# Patient Record
Sex: Female | Born: 1935
Health system: Southern US, Community
[De-identification: ages and names within clinical notes are randomized; demographics above are authoritative.]

## PROBLEM LIST (undated history)

## (undated) DIAGNOSIS — I10 Essential (primary) hypertension: Secondary | ICD-10-CM

## (undated) DIAGNOSIS — D649 Anemia, unspecified: Secondary | ICD-10-CM

## (undated) DIAGNOSIS — O9989 Other specified diseases and conditions complicating pregnancy, childbirth and the puerperium: Secondary | ICD-10-CM

## (undated) DIAGNOSIS — K219 Gastro-esophageal reflux disease without esophagitis: Secondary | ICD-10-CM

## (undated) DIAGNOSIS — Z8619 Personal history of other infectious and parasitic diseases: Secondary | ICD-10-CM

## (undated) DIAGNOSIS — G459 Transient cerebral ischemic attack, unspecified: Secondary | ICD-10-CM

## (undated) HISTORY — DX: Transient cerebral ischemic attack, unspecified: G45.9

## (undated) HISTORY — DX: Essential (primary) hypertension: I10

## (undated) HISTORY — PX: BREAST BIOPSY: SHX20

## (undated) HISTORY — DX: Personal history of other infectious and parasitic diseases: Z86.19

## (undated) HISTORY — DX: Anemia, unspecified: D64.9

## (undated) HISTORY — DX: Other specified diseases and conditions complicating pregnancy, childbirth and the puerperium: O99.89

## (undated) HISTORY — PX: CHOLECYSTECTOMY: SHX55

## (undated) HISTORY — DX: Gastro-esophageal reflux disease without esophagitis: K21.9

## (undated) HISTORY — PX: PARTIAL HYSTERECTOMY: SHX80

---

## 1959-11-07 DIAGNOSIS — O99891 Other specified diseases and conditions complicating pregnancy: Secondary | ICD-10-CM

## 1959-11-07 HISTORY — DX: Other specified diseases and conditions complicating pregnancy: O99.891

## 2001-10-15 ENCOUNTER — Other Ambulatory Visit: Admission: RE | Admit: 2001-10-15 | Discharge: 2001-10-15 | Payer: Self-pay | Admitting: Radiology

## 2001-10-23 ENCOUNTER — Ambulatory Visit (HOSPITAL_BASED_OUTPATIENT_CLINIC_OR_DEPARTMENT_OTHER): Admission: RE | Admit: 2001-10-23 | Discharge: 2001-10-23 | Payer: Self-pay | Admitting: General Surgery

## 2001-10-23 ENCOUNTER — Ambulatory Visit (HOSPITAL_COMMUNITY): Admission: RE | Admit: 2001-10-23 | Discharge: 2001-10-23 | Payer: Self-pay | Admitting: General Surgery

## 2001-10-23 ENCOUNTER — Encounter: Payer: Self-pay | Admitting: General Surgery

## 2001-10-23 ENCOUNTER — Encounter: Admission: RE | Admit: 2001-10-23 | Discharge: 2001-10-23 | Payer: Self-pay | Admitting: General Surgery

## 2001-11-14 ENCOUNTER — Ambulatory Visit: Admission: RE | Admit: 2001-11-14 | Discharge: 2002-02-12 | Payer: Self-pay | Admitting: *Deleted

## 2002-10-27 ENCOUNTER — Ambulatory Visit (HOSPITAL_COMMUNITY): Admission: RE | Admit: 2002-10-27 | Discharge: 2002-10-27 | Payer: Self-pay | Admitting: Internal Medicine

## 2002-10-27 ENCOUNTER — Encounter: Payer: Self-pay | Admitting: Internal Medicine

## 2006-02-12 ENCOUNTER — Ambulatory Visit: Payer: Self-pay | Admitting: Cardiology

## 2006-02-12 ENCOUNTER — Ambulatory Visit: Payer: Self-pay | Admitting: Internal Medicine

## 2006-02-16 ENCOUNTER — Encounter: Admission: RE | Admit: 2006-02-16 | Discharge: 2006-02-16 | Payer: Self-pay | Admitting: Internal Medicine

## 2006-04-30 ENCOUNTER — Encounter: Admission: RE | Admit: 2006-04-30 | Discharge: 2006-04-30 | Payer: Self-pay | Admitting: Internal Medicine

## 2007-02-11 ENCOUNTER — Ambulatory Visit: Payer: Self-pay | Admitting: Internal Medicine

## 2007-02-26 ENCOUNTER — Ambulatory Visit: Payer: Self-pay

## 2007-06-21 DIAGNOSIS — Z853 Personal history of malignant neoplasm of breast: Secondary | ICD-10-CM | POA: Insufficient documentation

## 2008-02-26 ENCOUNTER — Telehealth: Payer: Self-pay | Admitting: Internal Medicine

## 2008-03-02 ENCOUNTER — Encounter: Payer: Self-pay | Admitting: Internal Medicine

## 2011-02-23 ENCOUNTER — Other Ambulatory Visit: Payer: Self-pay | Admitting: Obstetrics and Gynecology

## 2011-02-23 DIAGNOSIS — H547 Unspecified visual loss: Secondary | ICD-10-CM

## 2011-02-23 DIAGNOSIS — R51 Headache: Secondary | ICD-10-CM

## 2011-02-28 ENCOUNTER — Ambulatory Visit
Admission: RE | Admit: 2011-02-28 | Discharge: 2011-02-28 | Disposition: A | Discharge status: Home / Self Care | Payer: Medicare Other | Source: Ambulatory Visit | Attending: Obstetrics and Gynecology | Admitting: Obstetrics and Gynecology

## 2011-02-28 DIAGNOSIS — H547 Unspecified visual loss: Secondary | ICD-10-CM

## 2011-02-28 DIAGNOSIS — R51 Headache: Secondary | ICD-10-CM

## 2011-04-25 ENCOUNTER — Encounter: Payer: Self-pay | Admitting: Family Medicine

## 2011-04-25 ENCOUNTER — Ambulatory Visit (INDEPENDENT_AMBULATORY_CARE_PROVIDER_SITE_OTHER): Payer: Medicare Other | Admitting: Family Medicine

## 2011-04-25 VITALS — BP 134/68 | Temp 98.8°F | Ht 63.0 in | Wt 207.8 lb

## 2011-04-25 DIAGNOSIS — I10 Essential (primary) hypertension: Secondary | ICD-10-CM | POA: Insufficient documentation

## 2011-04-25 DIAGNOSIS — G43809 Other migraine, not intractable, without status migrainosus: Secondary | ICD-10-CM

## 2011-04-25 DIAGNOSIS — K219 Gastro-esophageal reflux disease without esophagitis: Secondary | ICD-10-CM | POA: Insufficient documentation

## 2011-04-25 DIAGNOSIS — G43109 Migraine with aura, not intractable, without status migrainosus: Secondary | ICD-10-CM

## 2011-04-25 DIAGNOSIS — Z853 Personal history of malignant neoplasm of breast: Secondary | ICD-10-CM

## 2011-04-25 DIAGNOSIS — Z0000 Encounter for general adult medical examination without abnormal findings: Secondary | ICD-10-CM | POA: Insufficient documentation

## 2011-04-25 DIAGNOSIS — Z13220 Encounter for screening for lipoid disorders: Secondary | ICD-10-CM

## 2011-04-25 LAB — CBC WITH DIFFERENTIAL/PLATELET
Eosinophils Relative: 2.8 % (ref 0.0–5.0)
HCT: 37.4 % (ref 36.0–46.0)
Hemoglobin: 12.5 g/dL (ref 12.0–15.0)
Lymphocytes Relative: 27.3 % (ref 12.0–46.0)
Lymphs Abs: 1.5 10*3/uL (ref 0.7–4.0)
MCHC: 33.5 g/dL (ref 30.0–36.0)
MCV: 88.8 fl (ref 78.0–100.0)
Monocytes Absolute: 0.4 10*3/uL (ref 0.1–1.0)
Monocytes Relative: 6.4 % (ref 3.0–12.0)
Platelets: 210 10*3/uL (ref 150.0–400.0)
RDW: 14.6 % (ref 11.5–14.6)
WBC: 5.6 10*3/uL (ref 4.5–10.5)

## 2011-04-25 LAB — BASIC METABOLIC PANEL
BUN: 13 mg/dL (ref 6–23)
Calcium: 9.2 mg/dL (ref 8.4–10.5)
Chloride: 104 mEq/L (ref 96–112)
Creatinine, Ser: 0.6 mg/dL (ref 0.4–1.2)
GFR: 114.67 mL/min (ref >60.00)

## 2011-04-25 LAB — LIPID PANEL
HDL: 53.5 mg/dL (ref >39.00)
LDL Cholesterol: 117 mg/dL — ABNORMAL HIGH (ref 0–99)
Total CHOL/HDL Ratio: 4
Triglycerides: 143 mg/dL (ref 0.0–149.0)
VLDL: 28.6 mg/dL (ref 0.0–40.0)

## 2011-04-25 LAB — HEPATIC FUNCTION PANEL
ALT: 20 U/L (ref 0–35)
Alkaline Phosphatase: 97 U/L (ref 39–117)
Bilirubin, Direct: 0.1 mg/dL (ref 0.0–0.3)

## 2011-04-25 MED ORDER — OMEPRAZOLE 20 MG PO CPDR: 20.0000 mg | DELAYED_RELEASE_CAPSULE | Freq: Every day | ORAL | Status: DC

## 2011-04-25 MED ORDER — AMLODIPINE BESYLATE 10 MG PO TABS: 10.0000 mg | ORAL_TABLET | Freq: Every day | ORAL | Status: DC

## 2011-04-25 NOTE — Patient Instructions (Signed)
Schedule a follow up in 6 months to recheck your blood pressure We'll notify you of your lab results Please call with any questions or concerns Welcome!  We're glad to have you!

## 2011-04-25 NOTE — Progress Notes (Signed)
  Subjective:    Patient ID: Ann Conley, female    DOB: February 24, 1934, 77 y.o.   MRN: [id number]  HPI New to establish care.  Previous MD- Elana Alm (retired)  Here today for CPE.  Risk Factors: HTN- chronic problem for pt, is taking Amlodipine 'intermittently', 'i'm trying to get off it'.  No CP, SOB, HAs, visual changes, edema. Breast Cancer- has been cancer free for 8 yrs, s/p lumpectomy and radiation.  Due for mammogram this month- [facility]. Occular Migraine- had MRI 2 months ago, had normal scan.  sxs are intermittent. GERD- chronic problem for pt, sxs occur intermittently.  Takes Prilosec prn w/ good results Physical [facility] Fall risk: low risk Depression: denies current sxs Hearing: normal to whispered noise ADL's: independent Cognitive: normal linear thought process, no deficits noted Home Safety: safe at home, lives w/ husband Height, Weight, BMI, Visual Acuity: see vitals, vision corrected to 20/20 w/ glasses ([place], ophtho) Counseling: has not had colonoscopy in 15-20 yrs, UTD on mammo, s/p hysterectomy- ovaries remain (had pelvic exam last year) Labs Ordered: See A&P Care Plan: See A&P    Review of Systems Patient reports no vision/ hearing changes, adenopathy, fever, weight change, persistant/recurrent hoarseness, swallowing issues, chest pain, palpitations, edema, persistant/recurrent cough, hemoptysis, dyspnea (rest/exertional/paroxysmal nocturnal), gastrointestinal bleeding (melena, rectal bleeding), abdominal pain, significant heartburn, bowel changes, GU symptoms (dysuria, hematuria, incontinence), Gyn symptoms (abnormal  bleeding, pain),  syncope, focal weakness, memory loss, numbness & tingling, skin/hair/nail changes, abnormal bruising or bleeding, anxiety, or depression.     Objective:   Physical Exam  General Appearance:    Alert, cooperative, no distress, appears stated age  Head:    Normocephalic, without obvious  abnormality, atraumatic  Eyes:    PERRL, conjunctiva/corneas clear, EOM's intact, fundi    benign, both eyes  Ears:    Normal TM's and external ear canals, both ears  Nose:   Nares normal, septum midline, mucosa normal, no drainage    or sinus tenderness  Throat:   Lips, mucosa, and tongue normal; teeth and gums normal  Neck:   Supple, symmetrical, trachea midline, no adenopathy;    Thyroid: no enlargement/tenderness/nodules  Back:     Symmetric, no curvature, ROM normal, no CVA tenderness  Lungs:     Clear to auscultation bilaterally, respirations unlabored  Chest Wall:    No tenderness or deformity   Heart:    Regular rate and rhythm, S1 and S2 normal, no murmur, rub   or gallop  Breast Exam:    No tenderness, masses, or nipple abnormality  Abdomen:     Soft, non-tender, bowel sounds active all four quadrants,    no masses, no organomegaly  Genitalia:    Deferred- had GYN exam last year  Rectal:    Deferred  Extremities:   Extremities normal, atraumatic, no cyanosis or edema  Pulses:   2+ and symmetric all extremities  Skin:   Skin color, texture, turgor normal, no rashes or lesions  Lymph nodes:   Cervical, supraclavicular, and axillary nodes normal  Neurologic:   CNII-XII intact, normal strength, sensation and reflexes    throughout          Assessment & Plan:

## 2011-04-26 ENCOUNTER — Encounter: Payer: Self-pay | Admitting: *Deleted

## 2011-05-07 DIAGNOSIS — G43109 Migraine with aura, not intractable, without status migrainosus: Secondary | ICD-10-CM | POA: Insufficient documentation

## 2011-05-07 NOTE — Assessment & Plan Note (Signed)
Had normal MRI.  Reports sxs are infrequent.  Not currently problematic.  Will follow.

## 2011-05-07 NOTE — Assessment & Plan Note (Signed)
sxs well controlled on Prilosec prn.  Script given.

## 2011-05-07 NOTE — Assessment & Plan Note (Signed)
Pt UTD on mammogram.  No longer following w/ oncology.  Will continue to follow w/ yearly exams and mammos.

## 2011-05-07 NOTE — Assessment & Plan Note (Signed)
Pt's PE WNL.  UTD on mammo, no need for pap- will continue to do bimanual exams every 2-3 yrs.  Pt not currently interested in colonoscopy.  Will re-address at future visits.

## 2011-05-07 NOTE — Assessment & Plan Note (Signed)
BP well controlled.  Encouraged pt to take meds regularly.  Script provided.

## 2011-05-12 ENCOUNTER — Encounter: Payer: Self-pay | Admitting: Family Medicine

## 2011-09-18 ENCOUNTER — Ambulatory Visit (INDEPENDENT_AMBULATORY_CARE_PROVIDER_SITE_OTHER): Payer: Medicare Other | Admitting: Family Medicine

## 2011-09-18 ENCOUNTER — Encounter: Payer: Self-pay | Admitting: Family Medicine

## 2011-09-18 DIAGNOSIS — R05 Cough: Secondary | ICD-10-CM

## 2011-09-18 DIAGNOSIS — D239 Other benign neoplasm of skin, unspecified: Secondary | ICD-10-CM

## 2011-09-18 DIAGNOSIS — R059 Cough, unspecified: Secondary | ICD-10-CM | POA: Insufficient documentation

## 2011-09-18 DIAGNOSIS — D229 Melanocytic nevi, unspecified: Secondary | ICD-10-CM | POA: Insufficient documentation

## 2011-09-18 MED ORDER — BENZONATATE 200 MG PO CAPS: 200.0000 mg | ORAL_CAPSULE | Freq: Three times a day (TID) | ORAL | Status: AC | PRN

## 2011-09-18 MED ORDER — LORATADINE 10 MG PO TABS: 10.0000 mg | ORAL_TABLET | Freq: Every day | ORAL | Status: DC

## 2011-09-18 NOTE — Progress Notes (Signed)
  Subjective:    Patient ID: SOCORRO KANITZ, female    DOB: 05/15/1934, 77 y.o.   MRN: [id number]  HPI Cough- grandchildren visited 2 weeks ago, pt then developed congestion, cough.  sxs are worse at night and in the morning.  Mostly dry cough.  No fevers.  No ear pain.  Minimal nasal congestion.  No facial pain/pressure.  Skin irritation- located near L ear, appeared over the summer.  Not painful.  'i just know it's there'.   Review of Systems For ROS see HPI     Objective:   Physical Exam  Vitals reviewed. Constitutional: She appears well-developed and well-nourished. No distress.  HENT:  Head: Normocephalic and atraumatic.       TMs normal bilaterally Mild nasal congestion Throat w/out erythema, edema, or exudate  Eyes: Conjunctivae and EOM are normal. Pupils are equal, round, and reactive to light.  Neck: Normal range of motion. Neck supple.  Cardiovascular: Normal rate, regular rhythm, normal heart sounds and intact distal pulses.   No murmur heard. Pulmonary/Chest: Effort normal and breath sounds normal. No respiratory distress. She has no wheezes.       + dry cough  Lymphadenopathy:    She has no cervical adenopathy.  Skin: Skin is warm and dry.       Small flesh colored mole just anterior to L ear          Assessment & Plan:

## 2011-09-18 NOTE — Patient Instructions (Signed)
This appears to be a post-infectious cough and is not contagious Drink plenty of fluids Take the Tessalon as needed for cough Add Claritin or Zyrtec daily to help w/ the post nasal drip Call with any questions or concerns Happy Holidays!!

## 2011-09-18 NOTE — Assessment & Plan Note (Signed)
No concerning features.  Reassurance provided.

## 2011-09-18 NOTE — Assessment & Plan Note (Signed)
Most likely post-infectious cough.  No evidence of infxn on PE.  Probably an allergy component as well as pt has PND and complains of worsening sxs at night and in the AM.  Cough meds given.  Reviewed supportive care and red flags that should prompt return.  Pt expressed understanding and is in agreement w/ plan.

## 2012-01-03 ENCOUNTER — Emergency Department (INDEPENDENT_AMBULATORY_CARE_PROVIDER_SITE_OTHER): Payer: Medicare Other

## 2012-01-03 ENCOUNTER — Emergency Department (HOSPITAL_BASED_OUTPATIENT_CLINIC_OR_DEPARTMENT_OTHER)
Admission: EM | Admit: 2012-01-03 | Discharge: 2012-01-03 | Disposition: A | Discharge status: Home / Self Care | Payer: Medicare Other | Attending: Emergency Medicine | Admitting: Emergency Medicine

## 2012-01-03 ENCOUNTER — Ambulatory Visit: Payer: Medicare Other | Admitting: Family Medicine

## 2012-01-03 ENCOUNTER — Encounter (HOSPITAL_BASED_OUTPATIENT_CLINIC_OR_DEPARTMENT_OTHER): Payer: Self-pay | Admitting: *Deleted

## 2012-01-03 ENCOUNTER — Telehealth: Payer: Self-pay | Admitting: *Deleted

## 2012-01-03 DIAGNOSIS — I10 Essential (primary) hypertension: Secondary | ICD-10-CM | POA: Insufficient documentation

## 2012-01-03 DIAGNOSIS — R51 Headache: Secondary | ICD-10-CM

## 2012-01-03 DIAGNOSIS — K219 Gastro-esophageal reflux disease without esophagitis: Secondary | ICD-10-CM | POA: Insufficient documentation

## 2012-01-03 DIAGNOSIS — H547 Unspecified visual loss: Secondary | ICD-10-CM

## 2012-01-03 DIAGNOSIS — H538 Other visual disturbances: Secondary | ICD-10-CM

## 2012-01-03 NOTE — Telephone Encounter (Signed)
Called pt to clarify the current sxs, pt advised that she noted last night blocked vision with major headache all night, this morning she can see clearer with a slight headache, BP 162/98, advised pt per MD Beverely Low, she needs to be evaluated in the ER, pt advised that she would go to the ER

## 2012-01-03 NOTE — ED Notes (Signed)
Patient states she developed a severe headache last night at approximately 2300.  States the headache persisted all night.  This morning began to have blurry hazy vision.

## 2012-01-03 NOTE — ED Notes (Signed)
Pt wore glasses during Visual Acuity Screening

## 2012-01-03 NOTE — Telephone Encounter (Signed)
Agree w/ ER evaluation for pt w/ severe headache w/ visual changes

## 2012-01-03 NOTE — Discharge Instructions (Signed)
Headache, General, Unknown Cause The specific cause of your headache may not have been found today. There are many causes and types of headache. A few common ones are:  Tension headache.   Migraine.   Infections (examples: dental and sinus infections).   Bone and/or joint problems in the neck or jaw.   Depression.   Eye problems.  These headaches are not life threatening.  Headaches can sometimes be diagnosed by a patient history and a physical exam. Sometimes, lab and imaging studies (such as x-ray and/or CT scan) are used to rule out more serious problems. In some cases, a spinal tap (lumbar puncture) may be requested. There are many times when your exam and tests may be normal on the first visit even when there is a serious problem causing your headaches. Because of that, it is very important to follow up with your doctor or local clinic for further evaluation. FINDING OUT THE RESULTS OF TESTS  If a radiology test was performed, a radiologist will review your results.   You will be contacted by the emergency department or your physician if any test results require a change in your treatment plan.   Not all test results may be available during your visit. If your test results are not back during the visit, make an appointment with your caregiver to find out the results. Do not assume everything is normal if you have not heard from your caregiver or the medical facility. It is important for you to follow up on all of your test results.  HOME CARE INSTRUCTIONS   Keep follow-up appointments with your caregiver, or any specialist referral.   Only take over-the-counter or prescription medicines for pain, discomfort, or fever as directed by your caregiver.   Biofeedback, massage, or other relaxation techniques may be helpful.   Ice packs or heat applied to the head and neck can be used. Do this three to four times per day, or as needed.   Call your doctor if you have any questions or  concerns.   If you smoke, you should quit.  SEEK MEDICAL CARE IF:   You develop problems with medications prescribed.   You do not respond to or obtain relief from medications.   You have a change from the usual headache.   You develop nausea or vomiting.  SEEK IMMEDIATE MEDICAL CARE IF:   If your headache becomes severe.   You have an unexplained oral temperature above 102 F (38.9 C), or as your caregiver suggests.   You have a stiff neck.   You have loss of vision.   You have muscular weakness.   You have loss of muscular control.   You develop severe symptoms different from your first symptoms.   You start losing your balance or have trouble walking.   You feel faint or pass out.  MAKE SURE YOU:   Understand these instructions.   Will watch your condition.   Will get help right away if you are not doing well or get worse.  Document Released: 10/23/2005 Document Revised: 07/05/2011 Document Reviewed: 06/11/2008 [facility] Patient Information 2012 [place], [place].Visual Disturbances You have had a disturbance in your vision. This may be caused by various conditions, such as:  Migraines. Migraine headaches are often preceded by a disturbance in vision. Blind spots or light flashes are followed by a headache. This type of visual disturbance is temporary. It does not damage the eye.   Glaucoma. This is caused by increased pressure in the eye.  Symptoms include haziness, blurred vision, or seeing rainbow colored circles when looking at bright lights. Partial or complete visual loss can occur. You may or may not experience eye pain. Visual loss may be gradual or sudden and is irreversible. Glaucoma is the leading cause of blindness.   Retina problems. Vision will be reduced if the retina becomes detached or if there is a circulation problem as with diabetes, high blood pressure, or a mini-stroke. Symptoms include seeing "floaters," flashes of light, or shadows, as if a  curtain has fallen over your eye.   Optic nerve problems. The main nerve in your eye can be damaged by redness, soreness, and swelling (inflammation), poor circulation, drugs, and toxins.  It is very important to have a complete exam done by a specialist to determine the exact cause of your eye problem. The specialist may recommend medicines or surgery, depending on the cause of the problem. This can help prevent further loss of vision or reduce the risk of having a stroke. Contact the caregiver to whom you have been referred and arrange for follow-up care right away. SEEK IMMEDIATE MEDICAL CARE IF:   Your vision gets worse.   You develop severe headaches.   You have any weakness or numbness in the face, arms, or legs.   You have any trouble speaking or walking.  Document Released: 11/30/2004 Document Revised: 07/05/2011 Document Reviewed: 03/23/2010 [facility] Patient Information 2012 [place], [place].

## 2012-01-03 NOTE — ED Provider Notes (Signed)
History     CSN: [id number]  Arrival date & time 01/03/12  1244   First MD Initiated Contact with Patient 01/03/12 1313      Chief Complaint  Patient presents with  . Headache    (Consider location/radiation/quality/duration/timing/severity/associated sxs/prior treatment) HPI Comments: Patient complains of a headache to the bifrontal area. She states it started last night. It got rapidly worse and was bad during the night. She says when she woke up this morning she was actually feeling better and at this point is almost completely gone. She has taken several lAleve at home for the pain. She also complains of some vision changes. She feels like earlier there was a blackness to the left side of her vision field on the left eye. However, now she just describes some blurry vision in her left eye denies any pain in her eyes. Denies any drainage denies any slurred speech. Denies any balance problems that he numbness or weakness in her extremities  Patient is a 78 y.o. female presenting with headaches. The history is provided by the patient.  Headache  This is a new problem. Pertinent negatives include no fever, no shortness of breath, no nausea and no vomiting.    Past Medical History  Diagnosis Date  . Anemia   . History of chicken pox   . GERD (gastroesophageal reflux disease)   . Hypertension   . Migraine   . Blood transfusion complicating pregnancy 1961    Past Surgical History  Procedure Date  . Cholecystectomy   . Breast biopsy   . Partial hysterectomy     Family History  Problem Relation Age of Onset  . Breast cancer Mother   . Heart disease Father     History  Substance Use Topics  . Smoking status: Never Smoker   . Smokeless tobacco: Not on file  . Alcohol Use: Yes     wine occ.    OB History    Grav Para Term Preterm Abortions TAB SAB Ect Mult Living                  Review of Systems  Constitutional: Negative for fever, chills, diaphoresis and fatigue.   HENT: Negative for congestion, rhinorrhea and sneezing.   Eyes: Positive for visual disturbance. Negative for photophobia, pain and redness.  Respiratory: Negative for cough, chest tightness and shortness of breath.   Cardiovascular: Negative for chest pain and leg swelling.  Gastrointestinal: Negative for nausea, vomiting, abdominal pain, diarrhea and blood in stool.  Genitourinary: Negative for frequency, hematuria, flank pain and difficulty urinating.  Musculoskeletal: Negative for back pain and arthralgias.  Skin: Negative for rash.  Neurological: Positive for headaches. Negative for dizziness, syncope, facial asymmetry, speech difficulty, weakness and numbness.    Allergies  Codeine  Home Medications   Current Outpatient Rx  Name Route Sig Dispense Refill  . AMLODIPINE BESYLATE 10 MG PO TABS Oral Take 1 tablet (10 mg total) by mouth daily. 120 tablet 1  . CALCIUM 600 + D PO Oral Take by mouth as directed.      Marland Kitchen LORATADINE 10 MG PO TABS Oral Take 1 tablet (10 mg total) by mouth daily. 30 tablet 2  . OCUVITE PO Oral Take by mouth as directed.      Marland Kitchen OMEPRAZOLE 20 MG PO CPDR Oral Take 1 capsule (20 mg total) by mouth daily. 120 capsule 1    BP 170/81  Pulse 83  Temp(Src) 98.4 F (36.9 C) (Oral)  Resp  20  Ht 5\' 2"  (1.575 m)  Wt 203 lb (92.08 kg)  BMI 37.13 kg/m2  SpO2 96%  Physical Exam  Constitutional: She is oriented to person, place, and time. She appears well-developed and well-nourished.  HENT:  Head: Normocephalic and atraumatic.       Pupils equal, round, reactive to light. Extraocular eye movements are intact. She does seem to have some blurry vision, primarily in the left eye. I cannot ascertain whether there is a true visual field deficit. She can see my fingers in all field of vision. However, in primarily the lateral tilt of vision in both the left and the right eye. She can't, however, on holding up one or 2 fingers. I cannot ascertain whether this is a visual  field defect or just generalized blurry vision. She cannot ascertain whether she team double vision, or whether it just seems blurry  Eyes: Pupils are equal, round, and reactive to light.  Neck: Normal range of motion. Neck supple.  Cardiovascular: Normal rate, regular rhythm and normal heart sounds.   Pulmonary/Chest: Effort normal and breath sounds normal. No respiratory distress. She has no wheezes. She has no rales. She exhibits no tenderness.  Abdominal: Soft. Bowel sounds are normal. There is no tenderness. There is no rebound and no guarding.  Musculoskeletal: Normal range of motion. She exhibits no edema.  Lymphadenopathy:    She has no cervical adenopathy.  Neurological: She is alert and oriented to person, place, and time. No cranial nerve deficit. Coordination normal.       Motor 5 out of 5 all show many sensation grossly intact to light touch. All extremities. No pronator drift. Finger to nose intact  Skin: Skin is warm and dry. No rash noted.  Psychiatric: She has a normal mood and affect.    ED Course  Procedures (including critical care time)  Results for orders placed in visit on 04/25/11  CBC WITH DIFFERENTIAL      Component Value Range   WBC 5.6  4.5 - 10.5 (K/uL)   RBC 4.21  3.87 - 5.11 (Mil/uL)   Hemoglobin 12.5  12.0 - 15.0 (g/dL)   HCT 82.9  56.2 - 13.0 (%)   MCV 88.8  78.0 - 100.0 (fl)   MCHC 33.5  30.0 - 36.0 (g/dL)   RDW 86.5  78.4 - 69.6 (%)   Platelets 210.0  150.0 - 400.0 (K/uL)   Neutrophils Relative 63.1  43.0 - 77.0 (%)   Lymphocytes Relative 27.3  12.0 - 46.0 (%)   Monocytes Relative 6.4  3.0 - 12.0 (%)   Eosinophils Relative 2.8  0.0 - 5.0 (%)   Basophils Relative 0.4  0.0 - 3.0 (%)   Neutro Abs 3.5  1.4 - 7.7 (K/uL)   Lymphs Abs 1.5  0.7 - 4.0 (K/uL)   Monocytes Absolute 0.4  0.1 - 1.0 (K/uL)   Eosinophils Absolute 0.2  0.0 - 0.7 (K/uL)   Basophils Absolute 0.0  0.0 - 0.1 (K/uL)  BASIC METABOLIC PANEL      Component Value Range   Sodium 140   135 - 145 (mEq/L)   Potassium 4.1  3.5 - 5.1 (mEq/L)   Chloride 104  96 - 112 (mEq/L)   CO2 31  19 - 32 (mEq/L)   Glucose, Bld 102 (*) 70 - 99 (mg/dL)   BUN 13  6 - 23 (mg/dL)   Creatinine, Ser 0.6  0.4 - 1.2 (mg/dL)   Calcium 9.2  8.4 - 29.5 (mg/dL)  GFR 114.67  >60.00 (mL/min)  HEPATIC FUNCTION PANEL      Component Value Range   Total Bilirubin 0.4  0.3 - 1.2 (mg/dL)   Bilirubin, Direct 0.1  0.0 - 0.3 (mg/dL)   Alkaline Phosphatase 97  39 - 117 (U/L)   AST 22  0 - 37 (U/L)   ALT 20  0 - 35 (U/L)   Total Protein 6.8  6.0 - 8.3 (g/dL)   Albumin 4.3  3.5 - 5.2 (g/dL)  LIPID PANEL      Component Value Range   Cholesterol 199  0 - 200 (mg/dL)   Triglycerides 409.8  0.0 - 149.0 (mg/dL)   HDL 11.91  >47.82 (mg/dL)   VLDL 95.6  0.0 - 21.3 (mg/dL)   LDL Cholesterol 086 (*) 0 - 99 (mg/dL)   Total CHOL/HDL Ratio 4    TSH      Component Value Range   TSH 2.46  0.35 - 5.50 (uIU/mL)   Ct Head Wo Contrast  01/03/2012  *RADIOLOGY REPORT*  Clinical Data: Headache and blurred vision  CT HEAD WITHOUT CONTRAST  Technique:  Contiguous axial images were obtained from the base of the skull through the vertex without contrast.  Comparison: MRI 02/28/2011  Findings: Ventricle size is normal.  Mild chronic microvascular ischemia, better seen on the prior MRI.  Negative for acute infarct.  Negative for hemorrhage or mass.  The calvarium is intact.  IMPRESSION: No acute abnormality.  Original Report Authenticated By: Camelia Phenes, M.D.       1. Vision loss   2. Headache       MDM  Pt feels much better now.  Headache gone.  Vision is much better per her report.  Was 20/100 in right eye, now 20/70 in right.  Has been 20/40 in left eye.  She has an optician, Dr. Zipporah Plants whose office is closed today.  Spoke with Dr. Randon Goldsmith with opthamology who said that he feels okay with her seeing Dr. Zipporah Plants tomorrow.  If she cannot get in there, then she will see Dr. Randon Goldsmith.  I did measure pressures in the eye and  was 21 and 16 bilaterally.  She has no headache now, so I do not feel that LP is indicated to r/o things like SAH/meningitis.  No other neuro deificits to suggest CVA.  Will d/c to f/u tomorrow with Dr. Zipporah Plants.  Discussed with Dr. Beverely Low as well        Rolan Bucco, MD 01/03/12 [phone]

## 2012-02-27 ENCOUNTER — Other Ambulatory Visit: Payer: Self-pay | Admitting: Family Medicine

## 2012-02-27 NOTE — Telephone Encounter (Signed)
2-REFILLS  AMLODIPINE BESYLATE 10MG  TAB QTY 120 Take 1-tablet by mouth daily  Last filled 1.7.2013   &   OMEPRAZOLE 20MG  CER QTY 120 Take 1-capsule by mouth daily Last filled 10.24.12  Last OV 11.12.2012

## 2012-02-29 MED ORDER — AMLODIPINE BESYLATE 10 MG PO TABS: 10.0000 mg | ORAL_TABLET | Freq: Every day | ORAL | Status: DC

## 2012-02-29 MED ORDER — OMEPRAZOLE 20 MG PO CPDR: 20.0000 mg | DELAYED_RELEASE_CAPSULE | Freq: Every day | ORAL | Status: DC

## 2012-02-29 NOTE — Telephone Encounter (Signed)
rx sent to pharmacy by e-script Letter has been mailed to pt address noted in the chart to advise they are overdue for cpe/ov/labs and the pt needs to contact office to set up appt   

## 2012-05-17 ENCOUNTER — Other Ambulatory Visit: Payer: Self-pay | Admitting: Family Medicine

## 2012-05-17 MED ORDER — AMLODIPINE BESYLATE 10 MG PO TABS: 10.0000 mg | ORAL_TABLET | Freq: Every day | ORAL | Status: DC

## 2012-05-17 MED ORDER — OMEPRAZOLE 20 MG PO CPDR: 20.0000 mg | DELAYED_RELEASE_CAPSULE | Freq: Every day | ORAL | Status: DC

## 2012-05-17 NOTE — Telephone Encounter (Signed)
Refill-AmLODIPine Besylate (Tab) NORVASC 10 MG Take 1 tablet (10 mg total) by mouth daily. #120, last fill 4.25.13 Last ov 11.12.12  &  Refill - Omeprazole (Capsule Delayed Release) PRILOSEC 20 MG Take 1 capsule (20 mg total) by mouth daily.  #120, last fill 4.25.13

## 2012-05-17 NOTE — Telephone Encounter (Signed)
Ok to refill both x5 (this will get pt to her upcoming appt)

## 2012-05-17 NOTE — Telephone Encounter (Signed)
rx sent to pharmacy by e-script for #90   FYI: pt received letter to advise CPE needed AFTER 04-24-12, pt called in 04-30-12 to advise she is going out of town for three months and scheduled CPE for 09-09-12

## 2012-05-17 NOTE — Telephone Encounter (Signed)
MD Beverely Low made aware #90 has been sent for pt, noted ok

## 2012-09-09 ENCOUNTER — Ambulatory Visit (INDEPENDENT_AMBULATORY_CARE_PROVIDER_SITE_OTHER): Payer: Medicare Other | Admitting: Family Medicine

## 2012-09-09 ENCOUNTER — Encounter: Payer: Self-pay | Admitting: Family Medicine

## 2012-09-09 VITALS — BP 164/88 | HR 75 | Temp 97.7°F | Resp 17 | Ht 62.0 in | Wt 209.1 lb

## 2012-09-09 DIAGNOSIS — Z23 Encounter for immunization: Secondary | ICD-10-CM

## 2012-09-09 DIAGNOSIS — Z0000 Encounter for general adult medical examination without abnormal findings: Secondary | ICD-10-CM

## 2012-09-09 DIAGNOSIS — Z780 Asymptomatic menopausal state: Secondary | ICD-10-CM

## 2012-09-09 DIAGNOSIS — I10 Essential (primary) hypertension: Secondary | ICD-10-CM

## 2012-09-09 LAB — CBC WITH DIFFERENTIAL/PLATELET
Basophils Relative: 0.7 % (ref 0.0–3.0)
Eosinophils Absolute: 0.2 10*3/uL (ref 0.0–0.7)
Eosinophils Relative: 3.2 % (ref 0.0–5.0)
HCT: 40.2 % (ref 36.0–46.0)
Lymphs Abs: 1.5 10*3/uL (ref 0.7–4.0)
Monocytes Relative: 5.5 % (ref 3.0–12.0)
Neutrophils Relative %: 66.5 % (ref 43.0–77.0)
Platelets: 258 10*3/uL (ref 150.0–400.0)
RBC: 4.51 Mil/uL (ref 3.87–5.11)

## 2012-09-09 LAB — BASIC METABOLIC PANEL
Calcium: 9.2 mg/dL (ref 8.4–10.5)
Creatinine, Ser: 0.5 mg/dL (ref 0.4–1.2)
Glucose, Bld: 99 mg/dL (ref 70–99)
Potassium: 3.9 mEq/L (ref 3.5–5.1)

## 2012-09-09 LAB — HEPATIC FUNCTION PANEL
ALT: 19 U/L (ref 0–35)
AST: 21 U/L (ref 0–37)
Alkaline Phosphatase: 95 U/L (ref 39–117)
Bilirubin, Direct: 0 mg/dL (ref 0.0–0.3)
Total Bilirubin: 0.5 mg/dL (ref 0.3–1.2)
Total Protein: 8 g/dL (ref 6.0–8.3)

## 2012-09-09 LAB — TSH: TSH: 2.23 u[IU]/mL (ref 0.35–5.50)

## 2012-09-09 LAB — LDL CHOLESTEROL, DIRECT: Direct LDL: 125 mg/dL

## 2012-09-09 LAB — LIPID PANEL: HDL: 66.7 mg/dL (ref >39.00)

## 2012-09-09 MED ORDER — HYDROCHLOROTHIAZIDE 12.5 MG PO TABS: 12.5000 mg | ORAL_TABLET | Freq: Every day | ORAL | Status: DC

## 2012-09-09 NOTE — Patient Instructions (Addendum)
Follow up in 1 month to recheck BP Start the hydrochlorothiazide daily Continue the Norvasc Get back to regular exercise as you are able Call with any questions or concerns Keep up the good work!  You look great! Happy Fall!!

## 2012-09-09 NOTE — Progress Notes (Signed)
  Subjective:    Patient ID: Ann Conley, female    DOB: [birth year], 78 y.o.   MRN: [id number]  HPI Here today for CPE.  Risk Factors: HTN- chronic problem, on Norvasc daily.  Has not been exercising recently b/c of cataract surgery 1 week ago.  Elevated today.  No CP, SOB, HAs, edema. Physical Activity: has joined [employer], typically does regular exercise Fall Risk: low  Depression: asymptomatic, no sxs Hearing: normal to conversational tones and whispered voice ADL's: independent Cognitive: normal linear thought process, memory and attention intact Home Safety: safe at home, lives w/ husband Height, Weight, BMI, Visual Acuity: see vitals, vision corrected to 20/20 w/ glasses and cataract surgery Counseling: overdue on colonoscopy (20 yrs ago) 'right now i don't see any point'.  Has mammo appt tomorrow.  S/p hysterectomy no need for paps.  Overdue on DEXA. Labs Ordered: See A&P Care Plan: See A&P    Review of Systems Patient reports no vision/ hearing changes, adenopathy,fever, weight change,  persistant/recurrent hoarseness , swallowing issues, chest pain, palpitations, edema, persistant/recurrent cough, hemoptysis, dyspnea (rest/exertional/paroxysmal nocturnal), gastrointestinal bleeding (melena, rectal bleeding), abdominal pain, significant heartburn, bowel changes, GU symptoms (dysuria, hematuria, incontinence), Gyn symptoms (abnormal  bleeding, pain),  syncope, focal weakness, memory loss, numbness & tingling, skin/hair/nail changes, abnormal bruising or bleeding, anxiety, or depression.     Objective:   Physical Exam General Appearance:    Alert, cooperative, no distress, appears stated age  Head:    Normocephalic, without obvious abnormality, atraumatic  Eyes:    PERRL, conjunctiva/corneas clear, EOM's intact, fundi    benign, both eyes  Ears:    Normal TM's and external ear canals, both ears  Nose:   Nares normal, septum midline, mucosa normal, no drainage    or  sinus tenderness  Throat:   Lips, mucosa, and tongue normal; teeth and gums normal  Neck:   Supple, symmetrical, trachea midline, no adenopathy;    Thyroid: no enlargement/tenderness/nodules  Back:     Symmetric, no curvature, ROM normal, no CVA tenderness  Lungs:     Clear to auscultation bilaterally, respirations unlabored  Chest Wall:    No tenderness or deformity   Heart:    Regular rate and rhythm, S1 and S2 normal, no murmur, rub   or gallop  Breast Exam:    Deferred to mammo  Abdomen:     Soft, non-tender, bowel sounds active all four quadrants,    no masses, no organomegaly  Genitalia:    Deferred at pt's request  Rectal:    Extremities:   Extremities normal, atraumatic, no cyanosis or edema  Pulses:   2+ and symmetric all extremities  Skin:   Skin color, texture, turgor normal, no rashes or lesions  Lymph nodes:   Cervical, supraclavicular, and axillary nodes normal  Neurologic:   CNII-XII intact, normal strength, sensation and reflexes    throughout          Assessment & Plan:

## 2012-09-22 NOTE — Assessment & Plan Note (Signed)
Deteriorated.  Add HCTZ, continue Norvasc.  Check labs.  Will follow closely.

## 2012-09-22 NOTE — Assessment & Plan Note (Signed)
Pt's PE WNL.  Has mammo pending, no need for paps, not interested in repeat colonoscopy.  Check labs.  Anticipatory guidance provided.

## 2012-09-23 ENCOUNTER — Encounter: Payer: Self-pay | Admitting: Family Medicine

## 2012-10-09 ENCOUNTER — Ambulatory Visit: Payer: Medicare Other | Admitting: Family Medicine

## 2012-10-09 DIAGNOSIS — Z0289 Encounter for other administrative examinations: Secondary | ICD-10-CM

## 2012-10-10 ENCOUNTER — Telehealth: Payer: Self-pay | Admitting: Family Medicine

## 2012-10-10 MED ORDER — OMEPRAZOLE 20 MG PO CPDR: 20.0000 mg | DELAYED_RELEASE_CAPSULE | Freq: Every day | ORAL | Status: DC

## 2012-10-10 NOTE — Telephone Encounter (Signed)
Rx sent 

## 2012-10-10 NOTE — Telephone Encounter (Signed)
Refill: Omeprazole dr 20 mg. Take 1 capsule by mouth daily. Qty 90. Last fill 05-17-12

## 2012-10-11 ENCOUNTER — Telehealth: Payer: Self-pay | Admitting: Family Medicine

## 2012-10-11 MED ORDER — AMLODIPINE BESYLATE 10 MG PO TABS: 10.0000 mg | ORAL_TABLET | Freq: Every day | ORAL | Status: DC

## 2012-10-11 NOTE — Telephone Encounter (Signed)
Refill: Amlodipine besylate 10 mg tab. Take 1 tablet by mouth daily. Qty 90. Last fill 05-17-12.

## 2012-10-11 NOTE — Telephone Encounter (Signed)
Rx sent 

## 2013-01-24 ENCOUNTER — Other Ambulatory Visit: Payer: Self-pay | Admitting: Family Medicine

## 2013-01-28 ENCOUNTER — Other Ambulatory Visit: Payer: Self-pay | Admitting: Family Medicine

## 2013-03-13 ENCOUNTER — Encounter: Payer: Self-pay | Admitting: Family Medicine

## 2013-03-13 ENCOUNTER — Ambulatory Visit (INDEPENDENT_AMBULATORY_CARE_PROVIDER_SITE_OTHER): Payer: Medicare Other | Admitting: Family Medicine

## 2013-03-13 VITALS — BP 138/80 | HR 69 | Temp 98.1°F | Ht 61.0 in | Wt 211.2 lb

## 2013-03-13 DIAGNOSIS — M25569 Pain in unspecified knee: Secondary | ICD-10-CM

## 2013-03-13 DIAGNOSIS — R0609 Other forms of dyspnea: Secondary | ICD-10-CM

## 2013-03-13 DIAGNOSIS — I10 Essential (primary) hypertension: Secondary | ICD-10-CM

## 2013-03-13 DIAGNOSIS — R0989 Other specified symptoms and signs involving the circulatory and respiratory systems: Secondary | ICD-10-CM

## 2013-03-13 DIAGNOSIS — R0683 Snoring: Secondary | ICD-10-CM | POA: Insufficient documentation

## 2013-03-13 DIAGNOSIS — M25562 Pain in left knee: Secondary | ICD-10-CM | POA: Insufficient documentation

## 2013-03-13 DIAGNOSIS — M25561 Pain in right knee: Secondary | ICD-10-CM

## 2013-03-13 LAB — BASIC METABOLIC PANEL
Calcium: 8.9 mg/dL (ref 8.4–10.5)
Creatinine, Ser: 0.6 mg/dL (ref 0.4–1.2)
GFR: 103.19 mL/min (ref >60.00)
Sodium: 139 mEq/L (ref 135–145)

## 2013-03-13 MED ORDER — MELOXICAM 7.5 MG PO TABS: 7.5000 mg | ORAL_TABLET | Freq: Every day | ORAL | Status: DC

## 2013-03-13 NOTE — Progress Notes (Signed)
  Subjective:    Patient ID: Ann Conley, female    DOB: December 25, 1933, 79 y.o.   MRN: [id number]  HPI Joint pain- when pt called for appt a couple of weeks ago 'i had a tremendous amount of joint pain but that seems to be getting better'.  Has had knee discomfort 'for awhile'.  Now w/ bilateral knee pain w/ standing up but this improves once upright.  Reports knees are 'cold' while sitting.  Pain improves w/ ibuprofen.  Snoring- pt reports husband has gone upstairs to sleep due to the severity of sxs.  No gasping for air or breathing pauses.  Pt reports sxs started in the 'last year or 2'.  No daytime sleepiness.  Mother and father were both loud snorers.  Denies AM dry mouth  HTN- chronic problem, higher today than usual.  Denies abd pain, SOB, HAs, visual changes, edema.  Review of Systems For ROS see HPI     Objective:   Physical Exam  Vitals reviewed. Constitutional: She is oriented to person, place, and time. She appears well-developed and well-nourished. No distress.  HENT:  Head: Normocephalic and atraumatic.  Eyes: Conjunctivae and EOM are normal. Pupils are equal, round, and reactive to light.  Neck: Normal range of motion. Neck supple. No thyromegaly present.  Cardiovascular: Normal rate, regular rhythm, normal heart sounds and intact distal pulses.   No murmur heard. Pulmonary/Chest: Effort normal and breath sounds normal. No respiratory distress.  Abdominal: Soft. She exhibits no distension. There is no tenderness.  Musculoskeletal: She exhibits no edema and no tenderness (no TTP over knees bilaterally, + crepitus w/ flexion/extension).  Lymphadenopathy:    She has no cervical adenopathy.  Neurological: She is alert and oriented to person, place, and time. She has normal reflexes. Coordination normal.  Skin: Skin is warm and dry.  Psychiatric: She has a normal mood and affect. Her behavior is normal.          Assessment & Plan:

## 2013-03-13 NOTE — Patient Instructions (Addendum)
Try the Breathe Right nose strips for the snoring- if this doesn't help, please call me and we'll set you up to see ENT We'll notify you of your lab results Start the Mobic daily for the knee discomfort- you can add tylenol if needed for breakthrough pain Call when you need your meds filled Call with any questions or concerns Happy Mothers Day!

## 2013-03-14 ENCOUNTER — Encounter: Payer: Self-pay | Admitting: General Practice

## 2013-03-23 NOTE — Assessment & Plan Note (Signed)
New.  No current pain.  Stiffness upon standing w/ rapid resolution and crepitus on PE consistent w/ arthritic change.  Start daily NSAID, tylenol prn.  Monitor for improvement.  If worsening, will refer to ortho.  Pt expressed understanding and is in agreement w/ plan.

## 2013-03-23 NOTE — Assessment & Plan Note (Signed)
New.  No reported breathing pauses, daytime sleepiness, or other factors suggestive of OSA.  Start w/ Breathe Right nose strips to see if sxs improve.  If no improvement, refer to ENT.  Reviewed supportive care and red flags that should prompt return.  Pt expressed understanding and is in agreement w/ plan.

## 2013-03-23 NOTE — Assessment & Plan Note (Signed)
Chronic problem.  Initially elevated on exam today but improved throughout OV.  Asymptomatic.  No changes.

## 2013-05-01 ENCOUNTER — Other Ambulatory Visit: Payer: Self-pay | Admitting: *Deleted

## 2013-05-01 DIAGNOSIS — I10 Essential (primary) hypertension: Secondary | ICD-10-CM

## 2013-05-01 MED ORDER — OMEPRAZOLE 20 MG PO CPDR: 20.0000 mg | DELAYED_RELEASE_CAPSULE | Freq: Every day | ORAL | Status: DC

## 2013-05-01 MED ORDER — HYDROCHLOROTHIAZIDE 12.5 MG PO TABS: 12.5000 mg | ORAL_TABLET | Freq: Every day | ORAL | Status: DC

## 2013-05-01 MED ORDER — AMLODIPINE BESYLATE 10 MG PO TABS: 10.0000 mg | ORAL_TABLET | Freq: Every day | ORAL | Status: DC

## 2013-05-01 NOTE — Telephone Encounter (Signed)
Rx sent to the pharmacy by e-script.//AB/CMA 

## 2013-09-23 ENCOUNTER — Encounter: Payer: Medicare Other | Admitting: Family Medicine

## 2013-11-20 LAB — HM MAMMOGRAPHY

## 2013-12-02 ENCOUNTER — Encounter: Payer: Self-pay | Admitting: Family Medicine

## 2014-02-12 ENCOUNTER — Other Ambulatory Visit: Payer: Self-pay | Admitting: Family Medicine

## 2014-02-12 NOTE — Telephone Encounter (Signed)
Med filled.  

## 2014-04-15 ENCOUNTER — Other Ambulatory Visit: Payer: Self-pay | Admitting: Family Medicine

## 2014-04-15 NOTE — Telephone Encounter (Signed)
Med filled.  

## 2014-06-03 ENCOUNTER — Encounter: Payer: Self-pay | Admitting: General Practice

## 2014-06-12 ENCOUNTER — Encounter: Payer: Self-pay | Admitting: Family Medicine

## 2014-06-12 ENCOUNTER — Ambulatory Visit (INDEPENDENT_AMBULATORY_CARE_PROVIDER_SITE_OTHER): Payer: Medicare HMO | Admitting: Family Medicine

## 2014-06-12 VITALS — BP 134/84 | HR 78 | Temp 98.0°F | Resp 17 | Wt 208.2 lb

## 2014-06-12 DIAGNOSIS — I10 Essential (primary) hypertension: Secondary | ICD-10-CM

## 2014-06-12 MED ORDER — AMLODIPINE BESYLATE 10 MG PO TABS
10.0000 mg | ORAL_TABLET | Freq: Every day | ORAL | Status: DC
Start: 2014-06-12 — End: 2015-01-01

## 2014-06-12 MED ORDER — OMEPRAZOLE 20 MG PO CPDR: DELAYED_RELEASE_CAPSULE | ORAL | Status: DC

## 2014-06-12 NOTE — Progress Notes (Signed)
Pre visit review using our clinic review tool, if applicable. No additional management support is needed unless otherwise documented below in the visit note. 

## 2014-06-12 NOTE — Assessment & Plan Note (Signed)
Adequate control on Amlodipine.  No med changes at this time.  Will continue to follow.  Stressed importance of regular follow ups for BP in order to keep prescribing meds.  Pt expressed understanding.

## 2014-06-12 NOTE — Progress Notes (Signed)
   Subjective:    Patient ID: Ann Conley, female    DOB: 09-29-1934, 80 y.o.   MRN: [id number]  HPI HTN- chronic problem, on Amlodipine.  Stopped HCTZ.  No CP, SOB, HAs, visual changes, edema.  Pt reports good water intake, low Na diet.     Review of Systems For ROS see HPI     Objective:   Physical Exam  Vitals reviewed. Constitutional: She is oriented to person, place, and time. She appears well-developed and well-nourished. No distress.  HENT:  Head: Normocephalic and atraumatic.  Eyes: Conjunctivae and EOM are normal. Pupils are equal, round, and reactive to light.  Neck: Normal range of motion. Neck supple. No thyromegaly present.  Cardiovascular: Normal rate, regular rhythm, normal heart sounds and intact distal pulses.   No murmur heard. Pulmonary/Chest: Effort normal and breath sounds normal. No respiratory distress.  Abdominal: Soft. She exhibits no distension. There is no tenderness.  Musculoskeletal: She exhibits no edema.  Lymphadenopathy:    She has no cervical adenopathy.  Neurological: She is alert and oriented to person, place, and time.  Skin: Skin is warm and dry.  Psychiatric: She has a normal mood and affect. Her behavior is normal.          Assessment & Plan:

## 2014-06-12 NOTE — Patient Instructions (Addendum)
Follow up as scheduled in November for your physical Your blood pressure looks good!  No changes at this time Call with any questions or concerns Have a great trip!!!

## 2014-09-17 ENCOUNTER — Ambulatory Visit (INDEPENDENT_AMBULATORY_CARE_PROVIDER_SITE_OTHER): Payer: Medicare HMO | Admitting: Family Medicine

## 2014-09-17 ENCOUNTER — Encounter: Payer: Self-pay | Admitting: Family Medicine

## 2014-09-17 VITALS — BP 140/78 | HR 77 | Temp 98.2°F | Resp 16 | Ht 61.25 in | Wt 211.2 lb

## 2014-09-17 DIAGNOSIS — I10 Essential (primary) hypertension: Secondary | ICD-10-CM

## 2014-09-17 DIAGNOSIS — E669 Obesity, unspecified: Secondary | ICD-10-CM

## 2014-09-17 DIAGNOSIS — Z0000 Encounter for general adult medical examination without abnormal findings: Secondary | ICD-10-CM

## 2014-09-17 DIAGNOSIS — Z23 Encounter for immunization: Secondary | ICD-10-CM

## 2014-09-17 LAB — CBC WITH DIFFERENTIAL/PLATELET
BASOS ABS: 0 10*3/uL (ref 0.0–0.1)
Basophils Relative: 0.3 % (ref 0.0–3.0)
Eosinophils Absolute: 0.1 10*3/uL (ref 0.0–0.7)
Eosinophils Relative: 2.3 % (ref 0.0–5.0)
HCT: 38.8 % (ref 36.0–46.0)
Hemoglobin: 12.6 g/dL (ref 12.0–15.0)
LYMPHS ABS: 1.7 10*3/uL (ref 0.7–4.0)
LYMPHS PCT: 26.3 % (ref 12.0–46.0)
MCHC: 32.5 g/dL (ref 30.0–36.0)
MCV: 88.2 fl (ref 78.0–100.0)
MONOS PCT: 6 % (ref 3.0–12.0)
Monocytes Absolute: 0.4 10*3/uL (ref 0.1–1.0)
NEUTROS ABS: 4.1 10*3/uL (ref 1.4–7.7)
Neutrophils Relative %: 65.1 % (ref 43.0–77.0)
PLATELETS: 239 10*3/uL (ref 150.0–400.0)
RBC: 4.4 Mil/uL (ref 3.87–5.11)
RDW: 14.1 % (ref 11.5–15.5)
WBC: 6.4 10*3/uL (ref 4.0–10.5)

## 2014-09-17 LAB — HEPATIC FUNCTION PANEL
ALBUMIN: 3.6 g/dL (ref 3.5–5.2)
ALT: 18 U/L (ref 0–35)
AST: 18 U/L (ref 0–37)
Alkaline Phosphatase: 93 U/L (ref 39–117)
BILIRUBIN DIRECT: 0.1 mg/dL (ref 0.0–0.3)
TOTAL PROTEIN: 7.6 g/dL (ref 6.0–8.3)
Total Bilirubin: 0.6 mg/dL (ref 0.2–1.2)

## 2014-09-17 LAB — BASIC METABOLIC PANEL
BUN: 14 mg/dL (ref 6–23)
CHLORIDE: 103 meq/L (ref 96–112)
CO2: 23 meq/L (ref 19–32)
Calcium: 9.3 mg/dL (ref 8.4–10.5)
Creatinine, Ser: 0.6 mg/dL (ref 0.4–1.2)
GFR: 95.41 mL/min (ref >60.00)
Glucose, Bld: 86 mg/dL (ref 70–99)
Potassium: 3.9 mEq/L (ref 3.5–5.1)
SODIUM: 139 meq/L (ref 135–145)

## 2014-09-17 LAB — LIPID PANEL
CHOLESTEROL: 195 mg/dL (ref 0–200)
HDL: 62.1 mg/dL (ref >39.00)
LDL Cholesterol: 117 mg/dL — ABNORMAL HIGH (ref 0–99)
NonHDL: 132.9
TRIGLYCERIDES: 80 mg/dL (ref 0.0–149.0)
Total CHOL/HDL Ratio: 3
VLDL: 16 mg/dL (ref 0.0–40.0)

## 2014-09-17 LAB — TSH: TSH: 2.97 u[IU]/mL (ref 0.35–4.50)

## 2014-09-17 NOTE — Assessment & Plan Note (Signed)
Pt is working on [employer].  Very limited in ability to exercise due to bilateral knee arthritis.

## 2014-09-17 NOTE — Patient Instructions (Signed)
Follow up in 6 months to recheck BP We'll notify you of your lab results and make any changes if needed Complete the Cologuard as directed Keep up the good work!  You look great!! Call with any questions or concerns Happy Holidays and Happy Early [place]!!!

## 2014-09-17 NOTE — Assessment & Plan Note (Signed)
Pt's PE WNL w/ exception of obesity.  Due for colon screening- pt elects Cologuard.  UTD on mammo.  No need for paps.  Pt declines DEXA.  Written screening schedule updated and given to pt. Check labs.  Anticipatory guidance provided.

## 2014-09-17 NOTE — Progress Notes (Signed)
Pre visit review using our clinic review tool, if applicable. No additional management support is needed unless otherwise documented below in the visit note. 

## 2014-09-17 NOTE — Assessment & Plan Note (Signed)
Chronic problem.  Well controlled.  Asymptomatic.  No med changes at this time.

## 2014-09-17 NOTE — Progress Notes (Signed)
   Subjective:    Patient ID: Ann Conley, female    DOB: January 24, 1934, 80 y.o.   MRN: [id number]  HPI Here today for CPE.  Risk Factors: HTN- chronic problem, on Amlodipine.   Obesity- chronic problem, not able to exercise due to bilateral knee pain. Physical Activity: very limited due to arthritis Fall Risk: elevated due to bilateral knee arthritis Depression: denies current sxs Hearing: normal to conversational tones, mildly decreased to whispered tones ADL's: independent Cognitive: normal linear thought process, memory and attention intact Home Safety: safe at home, lives w/ husband Height, Weight, BMI, Visual Acuity: see vitals, vision corrected to 20/20 w/ glasses Counseling: UTD on mammo, due for colon cancer screening- pt open to idea of cologuard, no need for pap due to hysterectomy.  Pt declines DEXA POA/Living will: done Labs Ordered: See A&P Care Plan: See A&P    Review of Systems Patient reports no vision/ hearing changes, adenopathy,fever, weight change,  persistant/recurrent hoarseness , swallowing issues, chest pain, palpitations, edema, persistant/recurrent cough, hemoptysis, dyspnea (rest/exertional/paroxysmal nocturnal), gastrointestinal bleeding (melena, rectal bleeding), abdominal pain, significant heartburn, bowel changes, GU symptoms (dysuria, hematuria, incontinence), Gyn symptoms (abnormal  bleeding, pain),  syncope, focal weakness, memory loss, numbness & tingling, skin/hair/nail changes, abnormal bruising or bleeding, anxiety, or depression.     Objective:   Physical Exam General Appearance:    Alert, cooperative, no distress, appears stated age  Head:    Normocephalic, without obvious abnormality, atraumatic  Eyes:    PERRL, conjunctiva/corneas clear, EOM's intact, fundi    benign, both eyes  Ears:    Normal TM's and external ear canals, both ears  Nose:   Nares normal, septum midline, mucosa normal, no drainage    or sinus tenderness  Throat:    Lips, mucosa, and tongue normal; teeth and gums normal  Neck:   Supple, symmetrical, trachea midline, no adenopathy;    Thyroid: no enlargement/tenderness/nodules  Back:     Symmetric, no curvature, ROM normal, no CVA tenderness  Lungs:     Clear to auscultation bilaterally, respirations unlabored  Chest Wall:    No tenderness or deformity   Heart:    Regular rate and rhythm, S1 and S2 normal, no murmur, rub   or gallop  Breast Exam:    Deferred to mammo  Abdomen:     Soft, non-tender, bowel sounds active all four quadrants,    no masses, no organomegaly  Genitalia:    Deferred  Rectal:    Extremities:   Extremities normal, atraumatic, no cyanosis or edema  Pulses:   2+ and symmetric all extremities  Skin:   Skin color, texture, turgor normal, no rashes or lesions  Lymph nodes:   Cervical, supraclavicular, and axillary nodes normal  Neurologic:   CNII-XII intact, normal strength, sensation and reflexes    throughout          Assessment & Plan:

## 2014-09-18 ENCOUNTER — Encounter: Payer: Self-pay | Admitting: General Practice

## 2014-10-13 LAB — COLOGUARD

## 2014-10-19 ENCOUNTER — Ambulatory Visit (INDEPENDENT_AMBULATORY_CARE_PROVIDER_SITE_OTHER): Payer: Medicare HMO | Admitting: Podiatry

## 2014-10-19 ENCOUNTER — Ambulatory Visit: Payer: Self-pay | Admitting: Podiatry

## 2014-10-19 ENCOUNTER — Encounter: Payer: Self-pay | Admitting: Podiatry

## 2014-10-19 VITALS — BP 171/76 | HR 81 | Ht 61.25 in | Wt 211.0 lb

## 2014-10-19 DIAGNOSIS — M79606 Pain in leg, unspecified: Secondary | ICD-10-CM | POA: Insufficient documentation

## 2014-10-19 DIAGNOSIS — L570 Actinic keratosis: Secondary | ICD-10-CM

## 2014-10-19 DIAGNOSIS — M79605 Pain in left leg: Secondary | ICD-10-CM

## 2014-10-19 DIAGNOSIS — M2042 Other hammer toe(s) (acquired), left foot: Secondary | ICD-10-CM

## 2014-10-19 NOTE — Patient Instructions (Signed)
Seen for pain in left foot callus. Area debrided. Return as needed.

## 2014-10-19 NOTE — Progress Notes (Signed)
Subjective: 80 year old female presents complaining of painful calluses on left foot x 3-4 months.  Objective: Dermatologic: Hard round callus under 2nd MPJ left foot with intradermal bleeding. Vascular: All pedal pulses are palpable. No edema or erythema noted. Neurologic: All epicritic and tactile sensations are grossly intact. Orthopedic: Contracted hammer toe 2nd left.  Assessment: Painful callus under 2nd MPJ left foot. Hammer toe deformity 2nd digit left.  Plan: Reviewed findings. Debrided painful lesion left foot.

## 2014-10-22 ENCOUNTER — Encounter: Payer: Self-pay | Admitting: General Practice

## 2014-11-03 ENCOUNTER — Encounter: Payer: Self-pay | Admitting: Family Medicine

## 2014-12-21 ENCOUNTER — Other Ambulatory Visit: Payer: Self-pay | Admitting: General Practice

## 2014-12-21 MED ORDER — OMEPRAZOLE 20 MG PO CPDR: DELAYED_RELEASE_CAPSULE | ORAL | Status: DC

## 2015-01-01 ENCOUNTER — Other Ambulatory Visit: Payer: Self-pay | Admitting: *Deleted

## 2015-01-01 MED ORDER — AMLODIPINE BESYLATE 10 MG PO TABS: 10.0000 mg | ORAL_TABLET | Freq: Every day | ORAL | Status: DC

## 2015-02-11 LAB — HM MAMMOGRAPHY: HM Mammogram: NORMAL

## 2015-05-12 ENCOUNTER — Other Ambulatory Visit: Payer: Self-pay | Admitting: Family Medicine

## 2015-05-12 NOTE — Telephone Encounter (Signed)
Med filled.  

## 2015-05-13 ENCOUNTER — Other Ambulatory Visit: Payer: Self-pay | Admitting: General Practice

## 2015-05-13 ENCOUNTER — Ambulatory Visit: Payer: Medicare HMO | Admitting: Medical

## 2015-05-13 MED ORDER — AMLODIPINE BESYLATE 10 MG PO TABS: 10.0000 mg | ORAL_TABLET | Freq: Every day | ORAL | Status: DC

## 2015-05-17 ENCOUNTER — Encounter: Payer: Self-pay | Admitting: Medical

## 2015-05-17 ENCOUNTER — Telehealth: Payer: Self-pay | Admitting: Medical

## 2015-05-17 NOTE — Telephone Encounter (Signed)
charge 

## 2015-05-17 NOTE — Telephone Encounter (Signed)
Pt was no show 05/13/15 2:30pm, acute appt, pt has not rescheduled, mailing letter, charge for no show?

## 2015-08-12 ENCOUNTER — Encounter: Payer: Self-pay | Admitting: Family Medicine

## 2015-08-12 ENCOUNTER — Ambulatory Visit (INDEPENDENT_AMBULATORY_CARE_PROVIDER_SITE_OTHER): Payer: PPO | Admitting: Family Medicine

## 2015-08-12 VITALS — BP 140/88 | HR 85 | Temp 98.0°F | Resp 16 | Ht 61.0 in | Wt 210.5 lb

## 2015-08-12 DIAGNOSIS — M25562 Pain in left knee: Secondary | ICD-10-CM

## 2015-08-12 DIAGNOSIS — M25561 Pain in right knee: Secondary | ICD-10-CM | POA: Diagnosis not present

## 2015-08-12 MED ORDER — MELOXICAM 7.5 MG PO TABS: 7.5000 mg | ORAL_TABLET | Freq: Every day | ORAL | Status: DC

## 2015-08-12 NOTE — Progress Notes (Signed)
   Subjective:    Patient ID: Ann Conley, female    DOB: [birth year], 81 y.o.   MRN: [id number]  HPI L knee pain- pt reports she twisted knee mid-July.  + swelling at the time of injury.  Pt is using essential oils for pain relief which is helping.  Painful to bend knee, some pain w/ weight bearing.  Pain is anterior.  No clicking or popping.  Some improvement w/ ibuprofen.   Review of Systems For ROS see HPI     Objective:   Physical Exam  Constitutional: She is oriented to person, place, and time. She appears well-developed and well-nourished. No distress.  HENT:  Head: Normocephalic and atraumatic.  Cardiovascular: Intact distal pulses.   Musculoskeletal: She exhibits no edema (no swelling of knee) or tenderness (no TTP over medial/lateral joint line of L knee).  Pain w/ flexion/extension of L knee.  + crepitus  Neurological: She is alert and oriented to person, place, and time.  Skin: Skin is warm and dry. No erythema.  Vitals reviewed.         Assessment & Plan:

## 2015-08-12 NOTE — Patient Instructions (Signed)
Schedule your complete physical at your convenience We'll call you with your Ortho appt Start the daily Meloxicam- take w/ food Avoid aspirin, ibuprofen or other anti-inflammatories while on the Meloxicam You can add tylenol as needed Alternate ice/heat for pain relief Call with any questions or concerns Hang in there!!!

## 2015-08-12 NOTE — Progress Notes (Signed)
Pre visit review using our clinic review tool, if applicable. No additional management support is needed unless otherwise documented below in the visit note. 

## 2015-08-13 NOTE — Assessment & Plan Note (Signed)
New to provider.  Pt reports she twisted her knee in July while she was in [place].  Some relief w/ ibuprofen but pain is persisting.  Given a twisting mechanism of injury, will refer to ortho for complete evaluation and tx.  Start NSAIDs for pain relief in the interim.  Reviewed supportive care and red flags that should prompt return.  Pt expressed understanding and is in agreement w/ plan.

## 2015-09-13 ENCOUNTER — Other Ambulatory Visit: Payer: Self-pay | Admitting: Family Medicine

## 2015-11-19 NOTE — Telephone Encounter (Signed)
Pt called and asked what the $50 bill is she keeps getting. Informed her from appt 05/13/15 that she did not show for. Pt states that she is not going to pay the no show fee from 05/13/15 because she probably didn't come because it wasn't with Dr. Birdie Riddle.

## 2015-12-10 ENCOUNTER — Ambulatory Visit (INDEPENDENT_AMBULATORY_CARE_PROVIDER_SITE_OTHER): Payer: PPO | Admitting: Family Medicine

## 2015-12-10 ENCOUNTER — Encounter: Payer: Self-pay | Admitting: Family Medicine

## 2015-12-10 VITALS — BP 140/72 | HR 87 | Temp 97.9°F | Ht 61.0 in | Wt 211.6 lb

## 2015-12-10 DIAGNOSIS — Z0000 Encounter for general adult medical examination without abnormal findings: Secondary | ICD-10-CM | POA: Diagnosis not present

## 2015-12-10 DIAGNOSIS — I10 Essential (primary) hypertension: Secondary | ICD-10-CM | POA: Diagnosis not present

## 2015-12-10 DIAGNOSIS — E669 Obesity, unspecified: Secondary | ICD-10-CM

## 2015-12-10 LAB — CBC WITH DIFFERENTIAL/PLATELET
BASOS ABS: 0 10*3/uL (ref 0.0–0.1)
Basophils Relative: 0 % (ref 0–1)
Eosinophils Absolute: 0.2 10*3/uL (ref 0.0–0.7)
Eosinophils Relative: 2 % (ref 0–5)
HEMATOCRIT: 40.2 % (ref 36.0–46.0)
Hemoglobin: 13.2 g/dL (ref 12.0–15.0)
LYMPHS ABS: 2.3 10*3/uL (ref 0.7–4.0)
LYMPHS PCT: 29 % (ref 12–46)
MCH: 28.6 pg (ref 26.0–34.0)
MCHC: 32.8 g/dL (ref 30.0–36.0)
MCV: 87 fL (ref 78.0–100.0)
MPV: 9.7 fL (ref 8.6–12.4)
Monocytes Absolute: 0.6 10*3/uL (ref 0.1–1.0)
Monocytes Relative: 7 % (ref 3–12)
NEUTROS PCT: 62 % (ref 43–77)
Neutro Abs: 5 10*3/uL (ref 1.7–7.7)
PLATELETS: 281 10*3/uL (ref 150–400)
RBC: 4.62 MIL/uL (ref 3.87–5.11)
RDW: 14.2 % (ref 11.5–15.5)
WBC: 8.1 10*3/uL (ref 4.0–10.5)

## 2015-12-10 LAB — LIPID PANEL
CHOL/HDL RATIO: 2.8 ratio (ref <=5.0)
Cholesterol: 204 mg/dL — ABNORMAL HIGH (ref 125–200)
HDL: 74 mg/dL (ref >=46)
LDL CALC: 108 mg/dL (ref <130)
Triglycerides: 109 mg/dL (ref <150)
VLDL: 22 mg/dL (ref <30)

## 2015-12-10 LAB — HEPATIC FUNCTION PANEL
ALK PHOS: 85 U/L (ref 33–130)
ALT: 16 U/L (ref 6–29)
AST: 18 U/L (ref 10–35)
Albumin: 4.2 g/dL (ref 3.6–5.1)
BILIRUBIN INDIRECT: 0.3 mg/dL (ref 0.2–1.2)
BILIRUBIN TOTAL: 0.4 mg/dL (ref 0.2–1.2)
Bilirubin, Direct: 0.1 mg/dL (ref <=0.2)
Total Protein: 7.4 g/dL (ref 6.1–8.1)

## 2015-12-10 LAB — BASIC METABOLIC PANEL
BUN: 15 mg/dL (ref 7–25)
CALCIUM: 9.7 mg/dL (ref 8.6–10.4)
CO2: 28 mmol/L (ref 20–31)
Chloride: 99 mmol/L (ref 98–110)
Creat: 0.56 mg/dL — ABNORMAL LOW (ref 0.60–0.93)
Glucose, Bld: 99 mg/dL (ref 65–99)
Potassium: 4.3 mmol/L (ref 3.5–5.3)
SODIUM: 139 mmol/L (ref 135–146)

## 2015-12-10 NOTE — Patient Instructions (Signed)
Follow up in 6 months to recheck BP We'll notify you of your lab results and make any changes if needed Keep up the good work on healthy diet and regular exercise- you can do it!!! No need for colonoscopy or pap at this time You are due for mammogram in April Call with any questions or concerns If you want to join [place] at the new [place] office, any scheduled appointments will automatically transfer and we will see you at [phone] [place] [address] [place], [place], [place] [id number] ([place]) Have a great weekend!!!

## 2015-12-10 NOTE — Progress Notes (Signed)
Pre visit review using our clinic review tool, if applicable. No additional management support is needed unless otherwise documented below in the visit note. 

## 2015-12-10 NOTE — Progress Notes (Signed)
   Subjective:    Patient ID: Ann Conley, female    DOB: [birth year], 82 y.o.   MRN: [id number]  HPI Here today for CPE.  Risk Factors: HTN- chronic problem.  BP is at baseline for pt.  On Amlodipine daily. Obesity- weight is currently stable Physical Activity: pt plans to join [employer] Fall Risk: low risk Depression: denies current sxs Hearing: normal to conversational tones, mildly decreased to whispered voice ADL's: independent Cognitive: normal linear thought process, memory and attention intact Home Safety: safe at home, lives w/ husband Height, Weight, BMI, Visual Acuity: see vitals, vision corrected to 20/20 w/ glasses Counseling: UTD on mammo (due April), no need for pap due to hysterectomy, no need for colonoscopy due to age Care team reviewed and updated Labs Ordered: See A&P Care Plan: See A&P    Review of Systems Patient reports no vision/ hearing changes, adenopathy,fever, weight change,  persistant/recurrent hoarseness , swallowing issues, chest pain, palpitations, edema, persistant/recurrent cough, hemoptysis, dyspnea (rest/exertional/paroxysmal nocturnal), gastrointestinal bleeding (melena, rectal bleeding), abdominal pain, significant heartburn, bowel changes, GU symptoms (dysuria, hematuria, incontinence), Gyn symptoms (abnormal  bleeding, pain),  syncope, focal weakness, memory loss, numbness & tingling, skin/hair/nail changes, abnormal bruising or bleeding, anxiety, or depression.     Objective:   Physical Exam General Appearance:    Alert, cooperative, no distress, appears stated age, obese  Head:    Normocephalic, without obvious abnormality, atraumatic  Eyes:    PERRL, conjunctiva/corneas clear, EOM's intact, fundi    benign, both eyes  Ears:    Normal TM's and external ear canals, both ears  Nose:   Nares normal, septum midline, mucosa normal, no drainage    or sinus tenderness  Throat:   Lips, mucosa, and tongue normal; teeth and gums normal  Neck:    Supple, symmetrical, trachea midline, no adenopathy;    Thyroid: no enlargement/tenderness/nodules  Back:     Symmetric, no curvature, ROM normal, no CVA tenderness  Lungs:     Clear to auscultation bilaterally, respirations unlabored  Chest Wall:    No tenderness or deformity   Heart:    Regular rate and rhythm, S1 and S2 normal, no murmur, rub   or gallop  Breast Exam:    Deferred to mammo  Abdomen:     Soft, non-tender, bowel sounds active all four quadrants,    no masses, no organomegaly  Genitalia:    Deferred  Rectal:    Extremities:   Extremities normal, atraumatic, no cyanosis or edema  Pulses:   2+ and symmetric all extremities  Skin:   Skin color, texture, turgor normal, no rashes or lesions  Lymph nodes:   Cervical, supraclavicular, and axillary nodes normal  Neurologic:   CNII-XII intact, normal strength, sensation and reflexes    throughout          Assessment & Plan:

## 2015-12-11 LAB — TSH: TSH: 2.324 u[IU]/mL (ref 0.350–4.500)

## 2015-12-12 NOTE — Assessment & Plan Note (Signed)
Pt's PE WNL for age.  Pt declines colonoscopy due to age.  UTD on mammo.  No need for pap.  Pt declines flu shots.  Written screening schedule updated and given to pt.  Check labs.  Anticipatory guidance provided.

## 2015-12-12 NOTE — Assessment & Plan Note (Signed)
Chronic problem.  Adequate control for age.  Asymptomatic.  Check labs.  No anticipated med changes.  Will follow.

## 2015-12-12 NOTE — Assessment & Plan Note (Signed)
Ongoing issue.  Pt plans to join a gym and start to exercise regularly.  Attempting to make healthy food choices.  Check labs to risk stratify.  Will follow.

## 2015-12-13 ENCOUNTER — Encounter: Payer: Self-pay | Admitting: General Practice

## 2016-01-11 ENCOUNTER — Emergency Department (HOSPITAL_BASED_OUTPATIENT_CLINIC_OR_DEPARTMENT_OTHER): Payer: PPO

## 2016-01-11 ENCOUNTER — Encounter (HOSPITAL_BASED_OUTPATIENT_CLINIC_OR_DEPARTMENT_OTHER): Payer: Self-pay

## 2016-01-11 ENCOUNTER — Emergency Department (HOSPITAL_COMMUNITY): Payer: PPO

## 2016-01-11 ENCOUNTER — Emergency Department (HOSPITAL_BASED_OUTPATIENT_CLINIC_OR_DEPARTMENT_OTHER)
Admission: EM | Admit: 2016-01-11 | Discharge: 2016-01-11 | Disposition: A | Discharge status: Home / Self Care | Payer: PPO | Attending: Emergency Medicine | Admitting: Emergency Medicine

## 2016-01-11 DIAGNOSIS — G43109 Migraine with aura, not intractable, without status migrainosus: Secondary | ICD-10-CM

## 2016-01-11 DIAGNOSIS — G43909 Migraine, unspecified, not intractable, without status migrainosus: Secondary | ICD-10-CM | POA: Diagnosis not present

## 2016-01-11 DIAGNOSIS — I10 Essential (primary) hypertension: Secondary | ICD-10-CM | POA: Insufficient documentation

## 2016-01-11 DIAGNOSIS — R51 Headache: Secondary | ICD-10-CM | POA: Diagnosis present

## 2016-01-11 LAB — BASIC METABOLIC PANEL
Anion gap: 10 (ref 5–15)
BUN: 16 mg/dL (ref 6–20)
CALCIUM: 8.9 mg/dL (ref 8.9–10.3)
CO2: 27 mmol/L (ref 22–32)
CREATININE: 0.55 mg/dL (ref 0.44–1.00)
Chloride: 96 mmol/L — ABNORMAL LOW (ref 101–111)
GFR calc non Af Amer: >60 mL/min (ref >60)
GLUCOSE: 135 mg/dL — AB (ref 65–99)
Potassium: 3.8 mmol/L (ref 3.5–5.1)
Sodium: 133 mmol/L — ABNORMAL LOW (ref 135–145)

## 2016-01-11 LAB — CBC WITH DIFFERENTIAL/PLATELET
BASOS PCT: 0 %
Basophils Absolute: 0 10*3/uL (ref 0.0–0.1)
EOS ABS: 0 10*3/uL (ref 0.0–0.7)
EOS PCT: 0 %
HCT: 39.3 % (ref 36.0–46.0)
Hemoglobin: 13.2 g/dL (ref 12.0–15.0)
Lymphocytes Relative: 8 %
Lymphs Abs: 0.9 10*3/uL (ref 0.7–4.0)
MCH: 29.6 pg (ref 26.0–34.0)
MCHC: 33.6 g/dL (ref 30.0–36.0)
MCV: 88.1 fL (ref 78.0–100.0)
MONO ABS: 0.4 10*3/uL (ref 0.1–1.0)
MONOS PCT: 3 %
NEUTROS PCT: 89 %
Neutro Abs: 10.4 10*3/uL — ABNORMAL HIGH (ref 1.7–7.7)
Platelets: 220 10*3/uL (ref 150–400)
RBC: 4.46 MIL/uL (ref 3.87–5.11)
RDW: 13.5 % (ref 11.5–15.5)
WBC: 11.7 10*3/uL — ABNORMAL HIGH (ref 4.0–10.5)

## 2016-01-11 LAB — URINALYSIS, ROUTINE W REFLEX MICROSCOPIC
BILIRUBIN URINE: NEGATIVE
Glucose, UA: NEGATIVE mg/dL
HGB URINE DIPSTICK: NEGATIVE
KETONES UR: 15 mg/dL — AB
Leukocytes, UA: NEGATIVE
NITRITE: NEGATIVE
Protein, ur: NEGATIVE mg/dL
Specific Gravity, Urine: 1.016 (ref 1.005–1.030)
pH: 7 (ref 5.0–8.0)

## 2016-01-11 LAB — CBG MONITORING, ED: GLUCOSE-CAPILLARY: 136 mg/dL — AB (ref 65–99)

## 2016-01-11 MED ORDER — PROCHLORPERAZINE EDISYLATE 5 MG/ML IJ SOLN
10.0000 mg | Freq: Once | INTRAMUSCULAR | Status: AC
  Administered 2016-01-11: 10 mg via INTRAVENOUS
  Filled 2016-01-11: qty 2

## 2016-01-11 MED ORDER — DIPHENHYDRAMINE HCL 50 MG/ML IJ SOLN
25.0000 mg | Freq: Once | INTRAMUSCULAR | Status: AC
  Administered 2016-01-11: 25 mg via INTRAVENOUS
  Filled 2016-01-11: qty 1

## 2016-01-11 MED ORDER — KETOROLAC TROMETHAMINE 30 MG/ML IJ SOLN
30.0000 mg | Freq: Once | INTRAMUSCULAR | Status: AC
  Administered 2016-01-11: 30 mg via INTRAVENOUS
  Filled 2016-01-11: qty 1

## 2016-01-11 NOTE — Discharge Instructions (Signed)
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
## 2016-01-11 NOTE — ED Notes (Signed)
MD at bedside. 

## 2016-01-11 NOTE — ED Notes (Signed)
Received report from [facility] at [facility], plan for MRI. Alert and oriented at this time

## 2016-01-11 NOTE — ED Provider Notes (Signed)
CSN: [id number]     Arrival date & time 01/11/16  1612 History   First MD Initiated Contact with Patient 01/11/16 1717     Chief Complaint  Patient presents with  . Headache     (Consider location/radiation/quality/duration/timing/severity/associated sxs/prior Treatment) HPI Comments: 82 year old female with past medical history including hypertension, migraines, GERD who presents with headache and altered mental status. History obtained with the assistance of the patient's daughter and husband. Patient stated that she began having a headache around 8:30 this morning. She has had migraines previously but she states she has not had one in a long time. She has had associated photophobia and her headache is currently severe. According to the patient's sister, who traveled with the patient on a car ride today, she began driving "erratically" around 11:30 with some swerving on the road and change in her speech. When the patient arrived home, her family called EMS but she refused ambulance transport. She has been mildly disoriented to place and date but oriented to person and able to answer questions appropriately. They have not noticed any significant speech changes. Patient states that she did have blurry vision for approximately 20 minutes this morning which she has had previously with migraine headaches. No visual changes currently. No extremity weakness/numbness. No problems with balance.  Patient is a 82 y.o. female presenting with headaches. The history is provided by the patient, a relative and the spouse.  Headache   Past Medical History  Diagnosis Date  . Anemia   . History of chicken pox   . GERD (gastroesophageal reflux disease)   . Hypertension   . Migraine   . Blood transfusion complicating pregnancy [id number]   Past Surgical History  Procedure Laterality Date  . Cholecystectomy    . Breast biopsy    . Partial hysterectomy     Family History  Problem Relation Age of Onset  . Breast  cancer Mother   . Heart disease Father    Social History  Substance Use Topics  . Smoking status: Never Smoker   . Smokeless tobacco: None  . Alcohol Use: 0.0 oz/week    0 Standard drinks or equivalent per week     Comment: wine occ.   OB History    No data available     Review of Systems  Neurological: Positive for headaches.    10 Systems reviewed and are negative for acute change except as noted in the HPI.   Allergies  Codeine  Home Medications   Prior to Admission medications   Medication Sig Start Date End Date Taking? Authorizing Provider  amLODipine (NORVASC) 10 MG tablet TAKE 1 TABLET (10 MG TOTAL) BY MOUTH DAILY. 09/14/15   Midge Minium, MD  aspirin 81 MG tablet Take 81 mg by mouth daily.    Historical Provider, MD  Calcium Carbonate-Vitamin D (CALCIUM 600 + D PO) Take 1 tablet by mouth as directed.     Historical Provider, MD  meloxicam (MOBIC) 7.5 MG tablet Take 1 tablet (7.5 mg total) by mouth daily. 08/12/15   Midge Minium, MD  Multiple Vitamins-Minerals (OCUVITE PO) Take 2 tablets by mouth as directed.     Historical Provider, MD  omeprazole (PRILOSEC) 20 MG capsule TAKE 1 CAPSULE (20 MG TOTAL) BY MOUTH DAILY. 05/12/15   Midge Minium, MD   BP 150/69 mmHg  Pulse 86  Temp(Src) 99 F (37.2 C) (Oral)  Resp 18  Ht 5\' 2"  (1.575 m)  Wt 211 lb (95.709 kg)  BMI 38.58 kg/m2  SpO2 96% Physical Exam  Constitutional: She appears well-developed and well-nourished. No distress.  eyes closed, uncomfortable  HENT:  Head: Normocephalic and atraumatic.  Eyes: Conjunctivae and EOM are normal. Pupils are equal, round, and reactive to light.  Neck: Neck supple.  Cardiovascular: Normal rate, regular rhythm and normal heart sounds.   No murmur heard. Pulmonary/Chest: Effort normal and breath sounds normal. No respiratory distress.  Abdominal: Soft. Bowel sounds are normal. She exhibits no distension.  Musculoskeletal: She exhibits no edema.  5/5 strength  and normal sensation x all 4 ext  Neurological: She is alert. She has normal reflexes. No cranial nerve deficit. She exhibits normal muscle tone.  Oriented to person and place, Fluent speech, normal finger-to-nose testing, negative pronator drift  Skin: Skin is warm and dry.  Psychiatric: She has a normal mood and affect. Judgment and thought content normal.  Nursing note and vitals reviewed.   ED Course  Procedures (including critical care time) Labs Review Labs Reviewed  BASIC METABOLIC PANEL - Abnormal; Notable for the following:    Sodium 133 (*)    Chloride 96 (*)    Glucose, Bld 135 (*)    All other components within normal limits  CBC WITH DIFFERENTIAL/PLATELET - Abnormal; Notable for the following:    WBC 11.7 (*)    Neutro Abs 10.4 (*)    All other components within normal limits  CBG MONITORING, ED - Abnormal; Notable for the following:    Glucose-Capillary 136 (*)    All other components within normal limits  URINALYSIS, ROUTINE W REFLEX MICROSCOPIC (NOT AT [facility])    Imaging Review Ct Head Wo Contrast  01/11/2016  CLINICAL DATA:  Acute onset right-sided headache and facial pain this afternoon. Slurred speech. Disorientation. EXAM: CT HEAD WITHOUT CONTRAST TECHNIQUE: Contiguous axial images were obtained from the base of the skull through the vertex without intravenous contrast. COMPARISON:  01/03/2012 FINDINGS: There is no evidence of intracranial hemorrhage, brain edema, or other signs of acute infarction. There is no evidence of intracranial mass lesion or mass effect. No abnormal extraaxial fluid collections are identified. Mild chronic small vessel disease is again demonstrated. Ventricles are stable in size. No other intracranial abnormality identified. No evidence of skull fracture. IMPRESSION: No acute intracranial abnormality. Mild chronic small vessel disease. Electronically Signed   By: Earle Gell M.D.   On: 01/11/2016 17:53   I have personally reviewed and  evaluated these lab results as part of my medical decision-making.   EKG Interpretation None     Medications  diphenhydrAMINE (BENADRYL) injection 25 mg (25 mg Intravenous Given 01/11/16 1803)  prochlorperazine (COMPAZINE) injection 10 mg (10 mg Intravenous Given 01/11/16 1803)  ketorolac (TORADOL) 30 MG/ML injection 30 mg (30 mg Intravenous Given 01/11/16 1846)    MDM   Final diagnoses:  Complicated migraine   Patient presents with headache since this morning and later changes in speech as well as swerving while driving. Family has noticed mild disorientation. On exam, patient was uncomfortable with her eyes closed but in no acute distress. Vital signs notable for mild hypertension. No obvious neurologic deficits on neuro exam, however patient did state incorrect month. Basic lab work unremarkable and head CT is negative for acute process. Symptoms are concerning for TIA. I discussed w/ neurologist, Dr. Nicole Kindred, who felt that given pt's hx of migraines w/ visual changes, her symptoms may represent complicated migraine rather than TIA. He recommended MRI for evaluation, and if negative with improvement in  patient's headache and resolution of her symptoms, he felt that she was safe for discharge. Gave the patient a migraine cocktail with Benadryl, Compazine, and Toradol. I discussed transfer with the ED physician, Dr. Roxanne Mins, for MRI. Patient transferred to [facility] for MRI. The disposition pending results of imaging as well as patient's improvement.  Sharlett Iles, MD 01/12/16 [phone]

## 2016-01-11 NOTE — ED Notes (Signed)
MD at bedside. Pt sent to CT and family sent in and is speaking with MD.

## 2016-01-11 NOTE — ED Notes (Signed)
Patient left at this time with all belongings. 

## 2016-01-11 NOTE — ED Provider Notes (Signed)
Patient transferred from [facility] where she was evaluated by Dr. Rex Kras. She presented with some speech difficulties and slight disorientation associated with headache. She received a migraine cocktail and all symptoms have resolved. She was transferred here for MRI scan. Her TM now is completely normal with normal speech. She is being sent for MRI scan.  MRI is unremarkable. Family is present confirmed the patient is back to her neurologic baseline. She is reassured that there is no evidence of stroke and is discharged.  Results for orders placed or performed during the hospital encounter of [id number]  Basic metabolic panel  Result Value Ref Range   Sodium 133 (L) 135 - 145 mmol/L   Potassium 3.8 3.5 - 5.1 mmol/L   Chloride 96 (L) 101 - 111 mmol/L   CO2 27 22 - 32 mmol/L   Glucose, Bld 135 (H) 65 - 99 mg/dL   BUN 16 6 - 20 mg/dL   Creatinine, Ser 0.55 0.44 - 1.00 mg/dL   Calcium 8.9 8.9 - 10.3 mg/dL   GFR calc non Af Amer >60 >60 mL/min   GFR calc Af Amer >60 >60 mL/min   Anion gap 10 5 - 15  CBC with Differential  Result Value Ref Range   WBC 11.7 (H) 4.0 - 10.5 K/uL   RBC 4.46 3.87 - 5.11 MIL/uL   Hemoglobin 13.2 12.0 - 15.0 g/dL   HCT 39.3 36.0 - 46.0 %   MCV 88.1 78.0 - 100.0 fL   MCH 29.6 26.0 - 34.0 pg   MCHC 33.6 30.0 - 36.0 g/dL   RDW 13.5 11.5 - 15.5 %   Platelets 220 150 - 400 K/uL   Neutrophils Relative % 89 %   Neutro Abs 10.4 (H) 1.7 - 7.7 K/uL   Lymphocytes Relative 8 %   Lymphs Abs 0.9 0.7 - 4.0 K/uL   Monocytes Relative 3 %   Monocytes Absolute 0.4 0.1 - 1.0 K/uL   Eosinophils Relative 0 %   Eosinophils Absolute 0.0 0.0 - 0.7 K/uL   Basophils Relative 0 %   Basophils Absolute 0.0 0.0 - 0.1 K/uL  Urinalysis, Routine w reflex microscopic  Result Value Ref Range   Color, Urine YELLOW YELLOW   APPearance CLOUDY (A) CLEAR   Specific Gravity, Urine 1.016 1.005 - 1.030   pH 7.0 5.0 - 8.0   Glucose, UA NEGATIVE NEGATIVE mg/dL   Hgb urine dipstick  NEGATIVE NEGATIVE   Bilirubin Urine NEGATIVE NEGATIVE   Ketones, ur 15 (A) NEGATIVE mg/dL   Protein, ur NEGATIVE NEGATIVE mg/dL   Nitrite NEGATIVE NEGATIVE   Leukocytes, UA NEGATIVE NEGATIVE  CBG monitoring, ED  Result Value Ref Range   Glucose-Capillary 136 (H) 65 - 99 mg/dL   Comment 1 Notify RN    Comment 2 Document in Chart    Ct Head Wo Contrast  01/11/2016  CLINICAL DATA:  Acute onset right-sided headache and facial pain this afternoon. Slurred speech. Disorientation. EXAM: CT HEAD WITHOUT CONTRAST TECHNIQUE: Contiguous axial images were obtained from the base of the skull through the vertex without intravenous contrast. COMPARISON:  01/03/2012 FINDINGS: There is no evidence of intracranial hemorrhage, brain edema, or other signs of acute infarction. There is no evidence of intracranial mass lesion or mass effect. No abnormal extraaxial fluid collections are identified. Mild chronic small vessel disease is again demonstrated. Ventricles are stable in size. No other intracranial abnormality identified. No evidence of skull fracture. IMPRESSION: No acute intracranial abnormality. Mild chronic small vessel  disease. Electronically Signed   By: Earle Gell M.D.   On: 01/11/2016 17:53   Mr Brain Wo Contrast  01/11/2016  CLINICAL DATA:  Initial valuation for acute headache, altered sensorium. EXAM: MRI HEAD WITHOUT CONTRAST TECHNIQUE: Multiplanar, multiecho pulse sequences of the brain and surrounding structures were obtained without intravenous contrast. COMPARISON:  Prior CT from earlier same day. FINDINGS: Cerebral volume within normal limits for patient age. Scattered and patchy T2/FLAIR hyperintensity within the periventricular and deep white matter both cerebral hemispheres most compatible chronic small vessel ischemic disease, mild for patient age. No abnormal foci of restricted diffusion to suggest acute intracranial infarct. Major intracranial vascular flow voids are maintained. Gray-white  matter differentiation preserved. No acute or chronic intracranial hemorrhage. No areas of chronic infarction. No mass lesion, midline shift, or mass effect. No hydrocephalus. No extra-axial fluid collection. Craniocervical junction within normal limits. Mild multilevel degenerative spondylolysis noted within the visualized upper cervical spine without significant stenosis. Incidental note made of a partially empty sella. No acute abnormality about the orbits. Sequela prior bilateral lens extraction noted. Mild mucosal thickening within the left maxilla sinus and ethmoidal air cells. Paranasal sinuses are otherwise clear. No mastoid effusion.  Inner ear structures grossly normal. Bone marrow signal intensity within normal limits. No scalp soft tissue abnormality. IMPRESSION: 1. No acute intracranial process. 2. Mild chronic small vessel ischemic disease. Electronically Signed   By: Jeannine Boga M.D.   On: 01/11/2016 [id number]      Delora Fuel, MD [id number]

## 2016-01-24 ENCOUNTER — Ambulatory Visit: Payer: PPO | Admitting: Family Medicine

## 2016-01-25 ENCOUNTER — Ambulatory Visit: Payer: PPO | Admitting: Family Medicine

## 2016-02-16 LAB — HM MAMMOGRAPHY

## 2016-02-21 ENCOUNTER — Encounter: Payer: Self-pay | Admitting: Family Medicine

## 2016-02-21 ENCOUNTER — Ambulatory Visit (INDEPENDENT_AMBULATORY_CARE_PROVIDER_SITE_OTHER): Payer: PPO | Admitting: Family Medicine

## 2016-02-21 ENCOUNTER — Encounter: Payer: Self-pay | Admitting: General Practice

## 2016-02-21 VITALS — BP 140/82 | HR 79 | Temp 98.1°F | Resp 16 | Ht 62.0 in | Wt 213.2 lb

## 2016-02-21 DIAGNOSIS — G43909 Migraine, unspecified, not intractable, without status migrainosus: Secondary | ICD-10-CM

## 2016-02-21 DIAGNOSIS — G43109 Migraine with aura, not intractable, without status migrainosus: Secondary | ICD-10-CM

## 2016-02-21 NOTE — Patient Instructions (Signed)
Follow up as needed No med changes at this time If you have more frequent or worsening headaches, let me know! Call with any questions or concerns Hang in there!!! Happy Spring!!!

## 2016-02-21 NOTE — Progress Notes (Signed)
Pre visit review using our clinic review tool, if applicable. No additional management support is needed unless otherwise documented below in the visit note. 

## 2016-02-21 NOTE — Progress Notes (Signed)
   Subjective:    Patient ID: Ann Conley, female    DOB: Sep 02, 1934, 82 y.o.   MRN: [id number]  HPI Complicated migraine- pt was in [place] in early March when she developed a severe headache while out w/ her sister.  Sister drove her home and due to some difficulty w/ speech she 'ended up in the ER'.  On 3/7 had normal MRI and sxs resolved completely w/ migraine cocktail.  No HA since ER visit.  + family hx of migraine.     Review of Systems For ROS see HPI     Objective:   Physical Exam  Constitutional: She is oriented to person, place, and time. She appears well-developed and well-nourished. No distress.  HENT:  Head: Normocephalic and atraumatic.  Eyes: Conjunctivae and EOM are normal. Pupils are equal, round, and reactive to light.  Neck: Normal range of motion. Neck supple. No thyromegaly present.  Cardiovascular: Normal rate, regular rhythm, normal heart sounds and intact distal pulses.   No murmur heard. Pulmonary/Chest: Effort normal and breath sounds normal. No respiratory distress.  Abdominal: Soft. She exhibits no distension. There is no tenderness.  Musculoskeletal: She exhibits no edema.  Lymphadenopathy:    She has no cervical adenopathy.  Neurological: She is alert and oriented to person, place, and time.  Skin: Skin is warm and dry.  Psychiatric: She has a normal mood and affect. Her behavior is normal.  Vitals reviewed.         Assessment & Plan:

## 2016-02-21 NOTE — Assessment & Plan Note (Signed)
New.  Reviewed ER record and MRI report.  Pt has been asymptomatic since this event on 3/7.  She is not interested in starting beta blocker prophylaxis- 'you know i don't like medicine!'.  Pt is asymptomatic today and has no complaints.  She is to reach out if she has more frequent or worsening headaches.  Pt expressed understanding and is in agreement w/ plan.

## 2016-04-13 ENCOUNTER — Encounter: Payer: Self-pay | Admitting: Family Medicine

## 2016-04-13 ENCOUNTER — Ambulatory Visit (INDEPENDENT_AMBULATORY_CARE_PROVIDER_SITE_OTHER): Payer: PPO | Admitting: Family Medicine

## 2016-04-13 VITALS — BP 130/78 | HR 82 | Temp 98.2°F | Resp 16 | Ht 62.0 in | Wt 213.2 lb

## 2016-04-13 DIAGNOSIS — K921 Melena: Secondary | ICD-10-CM | POA: Diagnosis not present

## 2016-04-13 DIAGNOSIS — K146 Glossodynia: Secondary | ICD-10-CM

## 2016-04-13 DIAGNOSIS — K649 Unspecified hemorrhoids: Secondary | ICD-10-CM | POA: Insufficient documentation

## 2016-04-13 NOTE — Patient Instructions (Signed)
Follow up as needed The Burning Mouth Syndrome will usually resolve on its own and is not a cause for concern (annoying!  But not concerning) The blood on the toilet tissue is most likely due to hemorrhoids but if it continues, I need you to let me know so we can address it when you return in September Call with any questions or concerns Hang in there!! Have a great trip!

## 2016-04-13 NOTE — Assessment & Plan Note (Signed)
Pt's sxs are consistent w/ burning mouth syndrome.  Reviewed dx w/ pt and that this is often self limited and resolves spontaneously.  Offered medication options but pt declines and prefers to wait this out.  Will follow.

## 2016-04-13 NOTE — Progress Notes (Signed)
   Subjective:    Patient ID: Ann Conley, female    DOB: 25-Aug-1934, 82 y.o.   MRN: [id number]  HPI Blood w/ BM- pt reports there is blood present w/ wiping, 'but not all the time'.  Pt reports hx of hemorrhoids.  'it's not much'.  Last colonoscopy 2004.  Denies straining w/ BMs.  No constipation.  No pain w/ BMs.  No blood in bowl or in stool.  Bleeding has lessened and is very light currently.  Using hemorrhoid suppositories w/ some relief.    Burning in mouth- sxs started a couple of weeks ago.  No medication changes.  Not related to a particular food.  sxs will come and go.  Not severe.  'just a light burning'.  Saw dentist recently and everything was 'normal'.   Review of Systems For ROS see HPI     Objective:   Physical Exam  Constitutional: She is oriented to person, place, and time. She appears well-developed and well-nourished. No distress.  obese  HENT:  Head: Normocephalic and atraumatic.  Mouth/Throat: Oropharynx is clear and moist. No oropharyngeal exudate.  Genitourinary:  Pt declined rectal exam today  Neurological: She is alert and oriented to person, place, and time.  Skin: Skin is warm and dry.  Psychiatric: She has a normal mood and affect. Her behavior is normal. Thought content normal.  Vitals reviewed.         Assessment & Plan:

## 2016-04-13 NOTE — Assessment & Plan Note (Signed)
New.  Suspect this is due to her ongoing hemorrhoids.  Sxs are already improving and blood is only intermittent and minimal.  Recommended rectal exam in office but pt declined.  Reviewed supportive care for hemorrhoids and recommended that if she continues to have bleeding we refer for complete GI work up.  Pt expressed understanding and is in agreement w/ plan.

## 2016-04-13 NOTE — Progress Notes (Signed)
Pre visit review using our clinic review tool, if applicable. No additional management support is needed unless otherwise documented below in the visit note. 

## 2016-04-27 ENCOUNTER — Other Ambulatory Visit: Payer: Self-pay | Admitting: General Practice

## 2016-04-27 MED ORDER — OMEPRAZOLE 20 MG PO CPDR: DELAYED_RELEASE_CAPSULE | ORAL | Status: DC

## 2016-05-10 ENCOUNTER — Telehealth: Payer: Self-pay | Admitting: Family Medicine

## 2016-05-10 MED ORDER — AMLODIPINE BESYLATE 10 MG PO TABS: ORAL_TABLET | ORAL | Status: DC

## 2016-05-10 MED ORDER — OMEPRAZOLE 20 MG PO CPDR: DELAYED_RELEASE_CAPSULE | ORAL | Status: DC

## 2016-05-10 NOTE — Telephone Encounter (Signed)
Medication filled to pharmacy as requested.   

## 2016-05-10 NOTE — Telephone Encounter (Signed)
pt needs refills for a 90 day supply on all Rx to [facility] on [address].

## 2016-08-17 ENCOUNTER — Ambulatory Visit: Payer: Self-pay | Admitting: Family Medicine

## 2016-08-17 ENCOUNTER — Telehealth: Payer: Self-pay | Admitting: Family Medicine

## 2016-08-17 ENCOUNTER — Emergency Department (HOSPITAL_COMMUNITY)
Admission: EM | Admit: 2016-08-17 | Discharge: 2016-08-17 | Disposition: A | Discharge status: Home / Self Care | Payer: PPO | Attending: Emergency Medicine | Admitting: Emergency Medicine

## 2016-08-17 ENCOUNTER — Encounter (HOSPITAL_COMMUNITY): Payer: Self-pay | Admitting: Emergency Medicine

## 2016-08-17 DIAGNOSIS — Z79899 Other long term (current) drug therapy: Secondary | ICD-10-CM | POA: Insufficient documentation

## 2016-08-17 DIAGNOSIS — I10 Essential (primary) hypertension: Secondary | ICD-10-CM | POA: Diagnosis not present

## 2016-08-17 DIAGNOSIS — Z853 Personal history of malignant neoplasm of breast: Secondary | ICD-10-CM | POA: Insufficient documentation

## 2016-08-17 DIAGNOSIS — Z7982 Long term (current) use of aspirin: Secondary | ICD-10-CM | POA: Insufficient documentation

## 2016-08-17 DIAGNOSIS — G43109 Migraine with aura, not intractable, without status migrainosus: Secondary | ICD-10-CM | POA: Diagnosis not present

## 2016-08-17 DIAGNOSIS — R51 Headache: Secondary | ICD-10-CM | POA: Diagnosis present

## 2016-08-17 LAB — COMPREHENSIVE METABOLIC PANEL
ALK PHOS: 106 U/L (ref 38–126)
ALT: 23 U/L (ref 14–54)
AST: 26 U/L (ref 15–41)
Albumin: 4.9 g/dL (ref 3.5–5.0)
Anion gap: 10 (ref 5–15)
BUN: 11 mg/dL (ref 6–20)
CALCIUM: 9.4 mg/dL (ref 8.9–10.3)
CHLORIDE: 99 mmol/L — AB (ref 101–111)
CO2: 26 mmol/L (ref 22–32)
CREATININE: 0.58 mg/dL (ref 0.44–1.00)
GFR calc non Af Amer: >60 mL/min (ref >60)
Glucose, Bld: 124 mg/dL — ABNORMAL HIGH (ref 65–99)
Potassium: 3.6 mmol/L (ref 3.5–5.1)
SODIUM: 135 mmol/L (ref 135–145)
Total Bilirubin: 0.8 mg/dL (ref 0.3–1.2)
Total Protein: 8.3 g/dL — ABNORMAL HIGH (ref 6.5–8.1)

## 2016-08-17 LAB — CBC
HCT: 40.9 % (ref 36.0–46.0)
Hemoglobin: 13.5 g/dL (ref 12.0–15.0)
MCH: 28.4 pg (ref 26.0–34.0)
MCHC: 33 g/dL (ref 30.0–36.0)
MCV: 86.1 fL (ref 78.0–100.0)
PLATELETS: 274 10*3/uL (ref 150–400)
RBC: 4.75 MIL/uL (ref 3.87–5.11)
RDW: 13.8 % (ref 11.5–15.5)
WBC: 10.6 10*3/uL — ABNORMAL HIGH (ref 4.0–10.5)

## 2016-08-17 LAB — I-STAT TROPONIN, ED: Troponin i, poc: 0.06 ng/mL (ref 0.00–0.08)

## 2016-08-17 LAB — CBG MONITORING, ED: GLUCOSE-CAPILLARY: 126 mg/dL — AB (ref 65–99)

## 2016-08-17 MED ORDER — ACETAMINOPHEN 325 MG PO TABS
650.0000 mg | ORAL_TABLET | Freq: Once | ORAL | Status: AC
  Administered 2016-08-17: 650 mg via ORAL
  Filled 2016-08-17: qty 2

## 2016-08-17 NOTE — ED Notes (Signed)
Discharge instructions and follow up care reviewed with patient. Patient verbalized understanding. 

## 2016-08-17 NOTE — Telephone Encounter (Signed)
Patient Name: Ann Conley  DOB: Sep 01, 1934    Initial Comment Caller states thinks she has had a mini stroke, has a headache, can't remember, been very confused   Nurse Assessment  Nurse: Raphael Gibney, RN, Vanita Ingles Date/Time (Eastern Time): 08/17/2016 12:47:14 PM  Confirm and document reason for call. If symptomatic, describe symptoms. You must click the next button to save text entered. ---Caller states she has had a headache. Thinks she has had a mini stroke. Was having trouble remembering 30-40 min ago. Headache was more severe a few min ago.  Has the patient traveled out of the country within the last 30 days? ---Not Applicable  Does the patient have any new or worsening symptoms? ---Yes  Will a triage be completed? ---Yes  Related visit to physician within the last 2 weeks? ---No  Does the PT have any chronic conditions? (i.e. diabetes, asthma, etc.) ---Yes  List chronic conditions. ---HTN  Is this a behavioral health or substance abuse call? ---No     Guidelines    Guideline Title Affirmed Question Affirmed Notes  Headache [1] New headache AND [2] age > 69    Final Disposition User   See Physician within 4 Hours (or PCP triage) Raphael Gibney, RN, [place]    Comments  triage outcome upgraded to see physician within 4 hrs as her headache was severe about 30 min ago and she was having trouble focusing.  appt scheduled for 08/17/2016 at 2 pm with Dr. Dimple Nanas.   Referrals  REFERRED TO PCP OFFICE   Disagree/Comply: Comply

## 2016-08-17 NOTE — ED Notes (Signed)
MD at bedside. 

## 2016-08-17 NOTE — ED Triage Notes (Addendum)
Pt's family reports that pt had some memory loss at 60 Am this morning. Pt was disoriented to time . Pt sts she fel dizzy and had headache. Per family pt had similar event 6 months ago and was seen and Dx with migraine. Pt denies weakness nor vision changes. Pt ambulated to triage room with steady gait, no slurred speech noted

## 2016-08-17 NOTE — Telephone Encounter (Signed)
Agree w/ advice given and documentation by CMA

## 2016-08-17 NOTE — ED Provider Notes (Signed)
[facility] DEPT Provider Note   CSN: [id number] Arrival date & time: 08/17/16  1359     History   Chief Complaint Chief Complaint  Patient presents with  . Altered Mental Status    HPI Ann Conley is a 82 y.o. female.  Patient is a 82 year old female with history of migraines, hypertension and anemia presenting today with acute memory loss and headache. Patient states around 9 to 9:30 today she lost her memory as well as got a fairly severe headache and some mild dizziness. She took 2 ibuprofen and around 11:00 symptoms started to improve. She had spoken with her doctor who recommended she come here for further evaluation. Patient currently says her headaches at a 1 or 2 and her memory has completely returned. She states a similar thing happened in March of this year where she underwent MRI which was normal and they diagnosed her with migraine. Husband states during the episode around 9:30 she did complain of chest pain which she has never done before but she did not have any obvious signs of difficulty breathing, diaphoresis or nausea and vomiting. Patient cannot remember this. Eyes any chest pain at this point. She recently did drive back from [place] 2 weeks ago but denies any unilateral leg pain or swelling. She states they got out every 2-4 hours and walked around. She has no prior history of blood clots or cardiac disease.  She is planning to follow up with a headache doctor in November which was this earliest appointment she could get.  She denies any recent medication addition or cessation   The history is provided by the patient and the spouse.    Past Medical History:  Diagnosis Date  . Anemia   . Blood transfusion complicating pregnancy [id number]  . GERD (gastroesophageal reflux disease)   . History of chicken pox   . Hypertension   . Migraine     Patient Active Problem List   Diagnosis Date Noted  . 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 Blood in stool 04/13/2016    . Complicated migraine [id number]  . Keratoma 10/19/2014  . Pain in lower limb 10/19/2014  . Hammer toe of left foot 10/19/2014  . Obesity (BMI 30-39.9) 09/17/2014  . Snoring 03/13/2013  . Left anterior knee pain 03/13/2013  . Cough 09/18/2011  . Mole (skin) 09/18/2011  . Ocular migraine 05/07/2011  . HTN (hypertension) 04/25/2011  . GERD (gastroesophageal reflux disease) 04/25/2011  . Annual physical exam 04/25/2011  . BREAST CANCER, HX OF 06/21/2007    Past Surgical History:  Procedure Laterality Date  . BREAST BIOPSY    . CHOLECYSTECTOMY    . PARTIAL HYSTERECTOMY      OB History    No data available       Home Medications    Prior to Admission medications   Medication Sig Start Date End Date Taking? Authorizing Provider  amLODipine (NORVASC) 10 MG tablet TAKE 1 TABLET (10 MG TOTAL) BY MOUTH DAILY. 05/10/16   Midge Minium, MD  aspirin 81 MG tablet Take 81 mg by mouth daily.    Historical Provider, MD  Calcium Carbonate-Vitamin D (CALCIUM 600 + D PO) Take 1 tablet by mouth as directed.     Historical Provider, MD  meloxicam (MOBIC) 7.5 MG tablet Take 1 tablet (7.5 mg total) by mouth daily. 08/12/15   Midge Minium, MD  Multiple Vitamins-Minerals (OCUVITE PO) Take 2 tablets by mouth as directed.     Historical Provider, MD  omeprazole (PRILOSEC) 20 MG capsule TAKE 1 CAPSULE (20 MG TOTAL) BY MOUTH DAILY. 05/10/16   Midge Minium, MD    Family History Family History  Problem Relation Age of Onset  . Breast cancer Mother   . Heart disease Father     Social History Social History  Substance Use Topics  . Smoking status: Never Smoker  . Smokeless tobacco: Never Used  . Alcohol use 0.0 oz/week     Comment: wine occ.     Allergies   Codeine   Review of Systems Review of Systems  All other systems reviewed and are negative.    Physical Exam Updated Vital Signs BP 176/76 (BP Location: Right Arm)   Pulse 94   Temp 98.1 F (36.7 C) (Oral)    Resp 18   SpO2 96%   Physical Exam  Constitutional: She is oriented to person, place, and time. She appears well-developed and well-nourished. No distress.  HENT:  Head: Normocephalic and atraumatic.  Mouth/Throat: Oropharynx is clear and moist.  Eyes: Conjunctivae and EOM are normal. Pupils are equal, round, and reactive to light.  Neck: Normal range of motion. Neck supple.  Cardiovascular: Normal rate, regular rhythm and intact distal pulses.   No murmur heard. Pulmonary/Chest: Effort normal and breath sounds normal. No respiratory distress. She has no wheezes. She has no rales.  Abdominal: Soft. She exhibits no distension. There is no tenderness. There is no rebound and no guarding.  Musculoskeletal: Normal range of motion. She exhibits no edema or tenderness.  No lower extremity swelling or calf tenderness  Neurological: She is alert and oriented to person, place, and time. She has normal strength. No cranial nerve deficit or sensory deficit. She exhibits normal muscle tone. Coordination normal.  5 out of 5 strength in all extremities. Normal finger to nose, no visual field cuts. No pronator drift  Skin: Skin is warm and dry. No rash noted. No erythema.  Psychiatric: She has a normal mood and affect. Her behavior is normal.  Nursing note and vitals reviewed.    ED Treatments / Results  Labs (all labs ordered are listed, but only abnormal results are displayed) Labs Reviewed  COMPREHENSIVE METABOLIC PANEL - Abnormal; Notable for the following:       Result Value   Chloride 99 (*)    Glucose, Bld 124 (*)    Total Protein 8.3 (*)    All other components within normal limits  CBC - Abnormal; Notable for the following:    WBC 10.6 (*)    All other components within normal limits  CBG MONITORING, ED - Abnormal; Notable for the following:    Glucose-Capillary 126 (*)    All other components within normal limits  I-STAT TROPOININ, ED    EKG  EKG  Interpretation  Date/Time:  Thursday August 17 2016 14:35:30 EDT Ventricular Rate:  97 PR Interval:    QRS Duration: 102 QT Interval:  377 QTC Calculation: 479 R Axis:   -61 Text Interpretation:  Sinus rhythm LAD, consider left anterior fascicular block Low voltage, precordial leads No previous tracing Confirmed by Maryan Rued  MD, Loree Fee ([id number]) on 08/17/2016 2:44:56 PM       Radiology No results found.  Procedures Procedures (including critical care time)  Medications Ordered in ED Medications  acetaminophen (TYLENOL) tablet 650 mg (not administered)     Initial Impression / Assessment and Plan / ED Course  I have reviewed the triage vital signs and the nursing notes.  Pertinent labs &  imaging results that were available during my care of the patient were reviewed by me and considered in my medical decision making (see chart for details).  Clinical Course   Patient is a 82 year old female presenting today with headache and memory loss that started around 9:30 this morning. Around 11 AM symptoms started to resolve and now they are completely resolved and she is back to normal. She denies any unilateral numbness or weakness. No facial droops, slurred speech or difficulty swallowing. She denied any vision changes at this time. She has a history of migraine headaches which usually are ocular and she loses vision but that did not happen today. She had a similar symptom as this in March of this year and at that time had an MRI which showed no acute findings and it was felt she had complicated migraines.  At this time spoke with Dr. Kathrynn Speed and he had no further recommendations given her recent MRI and similar episode. Patient has follow-up with a headache specialist in November and encouraged them to call to try to get a earlier appointment. Patient was given Tylenol for her mild headache. She denies any fever, neck pain or infectious symptoms. Husband states during this  episode she did complain of chest pain today which was between 9 and 9:30. She did not complain of any other symptoms during that time and patient does not remember this event. She did recently travel from [place] here but has denied any unilateral leg pain or swelling and has no ongoing chest pain or shortness of breath at this time. She has no history of blood clots and states they stopped every 2-4 hours and walked around.  Patient's EKG without acute finding and troponin drawn almost 6 hours after the episode was negative. CBC and CMP wnl. At this time feel that patient is safe for discharge home. Low suspicion for PE, ACS or dissection. She has no infectious symptoms concerning for pneumonia.   Final Clinical Impressions(s) / ED Diagnoses   Final diagnoses:  Complicated migraine    New Prescriptions New Prescriptions   No medications on file     Blanchie Dessert, MD 08/17/16 1610

## 2016-08-17 NOTE — ED Notes (Signed)
Bed: WA15 Expected date:  Expected time:  Means of arrival:  Comments: ResB 

## 2016-08-17 NOTE — Telephone Encounter (Signed)
Spoke with pt husband, he states that the patient could not remember the day, time, or even what year it was this morning. She was complaining of the worst headache ever. Discussed phone note with PCP who advised immediate ER visit, due to high possibility of stroke. Pt husband stated an understanding and advised he was taking her to [facility] now.    appt for today cancelled.

## 2016-09-01 ENCOUNTER — Ambulatory Visit (INDEPENDENT_AMBULATORY_CARE_PROVIDER_SITE_OTHER): Payer: PPO | Admitting: Family Medicine

## 2016-09-01 ENCOUNTER — Encounter: Payer: Self-pay | Admitting: Family Medicine

## 2016-09-01 VITALS — BP 156/88 | HR 88 | Temp 98.1°F | Resp 17 | Ht 62.0 in | Wt 213.2 lb

## 2016-09-01 DIAGNOSIS — I10 Essential (primary) hypertension: Secondary | ICD-10-CM | POA: Diagnosis not present

## 2016-09-01 DIAGNOSIS — G43109 Migraine with aura, not intractable, without status migrainosus: Secondary | ICD-10-CM | POA: Diagnosis not present

## 2016-09-01 MED ORDER — METOPROLOL SUCCINATE ER 25 MG PO TB24: 25.0000 mg | ORAL_TABLET | Freq: Every day | ORAL | 3 refills | Status: DC

## 2016-09-01 NOTE — Progress Notes (Signed)
   Subjective:    Patient ID: Ann Conley, female    DOB: 1934/06/08, 82 y.o.   MRN: [id number]  HPI ER f/u- pt was seen in ED 10/12 after reporting 'severe headache' w/ memory loss and indicated she thought she was having a stroke.  She was sent to ER where sxs spontaneously resolved.  She has hx of a similar episode and was dx'd w/ a complicated migraine.  Pt has an appt scheduled w/ a HA specialist on November 15th.  Pt had 'ocular migraines' multiple times this summer in [place] where she lost vision in 1 eye.  During recent hospitalization, pt had normal EKG and troponins.  Pt continues to walk and do her regular activities.  BP is elevated today.  Denies CP, SOB.   Review of Systems For ROS see HPI     Objective:   Physical Exam  Constitutional: She is oriented to person, place, and time. She appears well-developed and well-nourished. No distress.  HENT:  Head: Normocephalic and atraumatic.  Eyes: Conjunctivae and EOM are normal. Pupils are equal, round, and reactive to light.  Neck: Normal range of motion. Neck supple. No thyromegaly present.  Cardiovascular: Normal rate, regular rhythm, normal heart sounds and intact distal pulses.   No murmur heard. Pulmonary/Chest: Effort normal and breath sounds normal. No respiratory distress.  Abdominal: Soft. She exhibits no distension. There is no tenderness.  Musculoskeletal: She exhibits no edema.  Lymphadenopathy:    She has no cervical adenopathy.  Neurological: She is alert and oriented to person, place, and time.  Skin: Skin is warm and dry.  Psychiatric: She has a normal mood and affect. Her behavior is normal.  Vitals reviewed.         Assessment & Plan:

## 2016-09-01 NOTE — Assessment & Plan Note (Signed)
Recurrent issue for pt.  ER work up was WNL and they did not recommend a repeat MRI as pt's sxs completely resolved.  She has an appt pending w/ a HA specialist in 2 weeks.  Will start low dose beta blocker to improve both BP and helpfully reduce risk of repeat migraine.  Reviewed supportive care and red flags that should prompt return.  Pt expressed understanding and is in agreement w/ plan.

## 2016-09-01 NOTE — Progress Notes (Signed)
Pre visit review using our clinic review tool, if applicable. No additional management support is needed unless otherwise documented below in the visit note. 

## 2016-09-01 NOTE — Assessment & Plan Note (Signed)
Deteriorated.  BP is elevated today and pt complaining of dull headache.  Add Metoprolol to pt's current Amlodipine.  Reviewed supportive care and red flags that should prompt return.  Pt expressed understanding and is in agreement w/ plan.

## 2016-09-01 NOTE — Patient Instructions (Signed)
Follow up in 3-4 months to recheck BP and headaches START the Metoprolol once daily Continue the Amlodipine daily Drink plenty of fluids- headaches worsen w/ dehydration Call with any questions or concerns Hang in there!!!

## 2016-10-09 ENCOUNTER — Ambulatory Visit: Payer: PPO | Admitting: Family Medicine

## 2017-02-01 ENCOUNTER — Other Ambulatory Visit: Payer: Self-pay | Admitting: Family Medicine

## 2017-03-09 ENCOUNTER — Telehealth: Payer: Self-pay | Admitting: Family Medicine

## 2017-03-09 DIAGNOSIS — D229 Melanocytic nevi, unspecified: Secondary | ICD-10-CM

## 2017-03-09 NOTE — Telephone Encounter (Signed)
Pt last seen 09/01/16 for BP, do you need to see her first? NO upcoming appts?

## 2017-03-09 NOTE — Telephone Encounter (Signed)
Advised patient referral has been placed and scheduled a follow up appt for BP on 03/22/17 @ 3p

## 2017-03-09 NOTE — Telephone Encounter (Signed)
Pt states she has a mole on back that is starting to itch and looks a little red. Pt  asking for a referral dermatologist.

## 2017-03-09 NOTE — Telephone Encounter (Signed)
Referral placed, could we schedule pt for a BP follow up?

## 2017-03-09 NOTE — Telephone Encounter (Signed)
Ok for derm referral for abnormal mole Pt needs to schedule BP f/u

## 2017-03-16 ENCOUNTER — Encounter: Payer: Self-pay | Admitting: Family Medicine

## 2017-03-22 ENCOUNTER — Ambulatory Visit (INDEPENDENT_AMBULATORY_CARE_PROVIDER_SITE_OTHER): Payer: Commercial Managed Care - HMO | Admitting: Family Medicine

## 2017-03-22 ENCOUNTER — Encounter: Payer: Self-pay | Admitting: Family Medicine

## 2017-03-22 VITALS — BP 136/83 | HR 77 | Temp 97.9°F | Resp 16 | Ht 62.0 in | Wt 209.0 lb

## 2017-03-22 DIAGNOSIS — E669 Obesity, unspecified: Secondary | ICD-10-CM

## 2017-03-22 DIAGNOSIS — I10 Essential (primary) hypertension: Secondary | ICD-10-CM | POA: Diagnosis not present

## 2017-03-22 LAB — CBC WITH DIFFERENTIAL/PLATELET
BASOS PCT: 1.1 % (ref 0.0–3.0)
Basophils Absolute: 0.1 10*3/uL (ref 0.0–0.1)
EOS PCT: 2.5 % (ref 0.0–5.0)
Eosinophils Absolute: 0.2 10*3/uL (ref 0.0–0.7)
HEMATOCRIT: 38.9 % (ref 36.0–46.0)
HEMOGLOBIN: 12.9 g/dL (ref 12.0–15.0)
LYMPHS PCT: 26.8 % (ref 12.0–46.0)
Lymphs Abs: 1.8 10*3/uL (ref 0.7–4.0)
MCHC: 33 g/dL (ref 30.0–36.0)
MCV: 87 fl (ref 78.0–100.0)
MONO ABS: 0.6 10*3/uL (ref 0.1–1.0)
Monocytes Relative: 8.2 % (ref 3.0–12.0)
Neutro Abs: 4.1 10*3/uL (ref 1.4–7.7)
Neutrophils Relative %: 61.4 % (ref 43.0–77.0)
Platelets: 276 10*3/uL (ref 150.0–400.0)
RBC: 4.48 Mil/uL (ref 3.87–5.11)
RDW: 14.4 % (ref 11.5–15.5)
WBC: 6.7 10*3/uL (ref 4.0–10.5)

## 2017-03-22 LAB — HEPATIC FUNCTION PANEL
ALK PHOS: 101 U/L (ref 39–117)
ALT: 17 U/L (ref 0–35)
AST: 18 U/L (ref 0–37)
Albumin: 4.4 g/dL (ref 3.5–5.2)
BILIRUBIN DIRECT: 0.1 mg/dL (ref 0.0–0.3)
TOTAL PROTEIN: 7.4 g/dL (ref 6.0–8.3)
Total Bilirubin: 0.5 mg/dL (ref 0.2–1.2)

## 2017-03-22 LAB — BASIC METABOLIC PANEL
BUN: 12 mg/dL (ref 6–23)
CALCIUM: 9.7 mg/dL (ref 8.4–10.5)
CO2: 32 meq/L (ref 19–32)
Chloride: 100 mEq/L (ref 96–112)
Creatinine, Ser: 0.59 mg/dL (ref 0.40–1.20)
GFR: 104.12 mL/min (ref >60.00)
Glucose, Bld: 96 mg/dL (ref 70–99)
POTASSIUM: 5.1 meq/L (ref 3.5–5.1)
SODIUM: 137 meq/L (ref 135–145)

## 2017-03-22 LAB — LIPID PANEL
Cholesterol: 206 mg/dL — ABNORMAL HIGH (ref 0–200)
HDL: 71.9 mg/dL (ref >39.00)
LDL Cholesterol: 110 mg/dL — ABNORMAL HIGH (ref 0–99)
NONHDL: 133.68
Total CHOL/HDL Ratio: 3
Triglycerides: 119 mg/dL (ref 0.0–149.0)
VLDL: 23.8 mg/dL (ref 0.0–40.0)

## 2017-03-22 LAB — TSH: TSH: 2.67 u[IU]/mL (ref 0.35–4.50)

## 2017-03-22 NOTE — Assessment & Plan Note (Signed)
Chronic problem.  Adequate control.  Asymptomatic.  Check labs.  No anticipated med changes at this time. 

## 2017-03-22 NOTE — Progress Notes (Signed)
   Subjective:    Patient ID: Ann Conley, female    DOB: 1934-09-22, 83 y.o.   MRN: [id number]  HPI HTN- chronic problem, on Amlodipine 10mg  daily.  Stopped her Metoprolol b/c of her burning tongue.  Pt has lost 4 lbs since last visit.  Pt is exercising 3x/week at the gym.  Denies CP, SOB, HAs, visual changes, edema.     Review of Systems For ROS see HPI     Objective:   Physical Exam  Constitutional: She is oriented to person, place, and time. She appears well-developed and well-nourished. No distress.  HENT:  Head: Normocephalic and atraumatic.  Eyes: Conjunctivae and EOM are normal. Pupils are equal, round, and reactive to light.  Neck: Normal range of motion. Neck supple. No thyromegaly present.  Cardiovascular: Normal rate, regular rhythm, normal heart sounds and intact distal pulses.   No murmur heard. Pulmonary/Chest: Effort normal and breath sounds normal. No respiratory distress.  Abdominal: Soft. She exhibits no distension. There is no tenderness.  Musculoskeletal: She exhibits no edema.  Lymphadenopathy:    She has no cervical adenopathy.  Neurological: She is alert and oriented to person, place, and time.  Skin: Skin is warm and dry.  Psychiatric: She has a normal mood and affect. Her behavior is normal.  Vitals reviewed.         Assessment & Plan:

## 2017-03-22 NOTE — Assessment & Plan Note (Signed)
Ongoing issue for pt.  She has lost 4 lbs recently w/ regular exercise.  Applauded her efforts.  Check labs to risk stratify.  Will follow.

## 2017-03-22 NOTE — Progress Notes (Signed)
Pre visit review using our clinic review tool, if applicable. No additional management support is needed unless otherwise documented below in the visit note. 

## 2017-03-22 NOTE — Patient Instructions (Signed)
Schedule your complete physical and Annual Wellness Visit in 6 months We'll notify you of your lab results and make any changes if needed Continue to work on healthy diet and regular exercise- you're doing great! Call with any questions or concerns Have a great summer!!!

## 2017-03-23 ENCOUNTER — Encounter: Payer: Self-pay | Admitting: General Practice

## 2017-04-25 ENCOUNTER — Other Ambulatory Visit: Payer: Self-pay | Admitting: Family Medicine

## 2017-05-01 ENCOUNTER — Ambulatory Visit (INDEPENDENT_AMBULATORY_CARE_PROVIDER_SITE_OTHER)
Admission: RE | Admit: 2017-05-01 | Discharge: 2017-05-01 | Disposition: A | Discharge status: Home / Self Care | Payer: Medicare HMO | Source: Ambulatory Visit | Attending: Physician Assistant | Admitting: Physician Assistant

## 2017-05-01 ENCOUNTER — Ambulatory Visit (INDEPENDENT_AMBULATORY_CARE_PROVIDER_SITE_OTHER): Payer: Medicare HMO | Admitting: Physician Assistant

## 2017-05-01 ENCOUNTER — Encounter: Payer: Self-pay | Admitting: Physician Assistant

## 2017-05-01 VITALS — BP 150/64 | HR 61 | Temp 98.1°F | Resp 16 | Ht 62.0 in | Wt 209.0 lb

## 2017-05-01 DIAGNOSIS — M1711 Unilateral primary osteoarthritis, right knee: Secondary | ICD-10-CM | POA: Diagnosis not present

## 2017-05-01 DIAGNOSIS — M1712 Unilateral primary osteoarthritis, left knee: Secondary | ICD-10-CM

## 2017-05-01 DIAGNOSIS — M25561 Pain in right knee: Secondary | ICD-10-CM

## 2017-05-01 NOTE — Progress Notes (Signed)
Patient presents to clinic today c/o pain in R knee x 4 days. Pain is medial and described as sharp and aching at the same time. Does not radiate. Denies redness or swelling. Denies known trauma or injury to the area. Has history of significant OA in L knee. Denies prior imaging of R knee. Denies knee buckling, numbness or tingling. .  Past Medical History:  Diagnosis Date  . Anemia   . Blood transfusion complicating pregnancy 6734  . GERD (gastroesophageal reflux disease)   . History of chicken pox   . Hypertension   . Migraine     Current Outpatient Prescriptions on File Prior to Visit  Medication Sig Dispense Refill  . amLODipine (NORVASC) 10 MG tablet TAKE ONE TABLET BY MOUTH DAILY 90 tablet 0  . aspirin 81 MG tablet Take 81 mg by mouth every evening.     . Calcium Carbonate-Vitamin D (CALCIUM 600 + D PO) Take 1 tablet by mouth as directed.     . Multiple Vitamins-Minerals (OCUVITE PO) Take 1 tablet by mouth 3 (three) times a week. No specific days of the week..    . omeprazole (PRILOSEC) 20 MG capsule TAKE ONE CAPSULE BY MOUTH DAILY 90 capsule 0   No current facility-administered medications on file prior to visit.     Allergies  Allergen Reactions  . Codeine Nausea And Vomiting    Family History  Problem Relation Age of Onset  . Breast cancer Mother   . Heart disease Father     Social History   Social History  . Marital status: Married    Spouse name: N/A  . Number of children: N/A  . Years of education: N/A   Social History Main Topics  . Smoking status: Never Smoker  . Smokeless tobacco: Never Used  . Alcohol use 0.0 oz/week     Comment: wine occ.  . Drug use: No  . Sexual activity: Not Asked   Other Topics Concern  . None   Social History Narrative  . None   Review of Systems - See HPI.  All other ROS are negative.  BP (!) 150/64   Pulse 61   Temp 98.1 F (36.7 C) (Oral)   Resp 16   Ht 5\' 2"  (1.575 m)   Wt 209 lb (94.8 kg)   SpO2 97%    BMI 38.23 kg/m   Physical Exam  Constitutional: She is oriented to person, place, and time and well-developed, well-nourished, and in no distress.  HENT:  Head: Normocephalic and atraumatic.  Eyes: Conjunctivae are normal.  Neck: Neck supple.  Cardiovascular: Normal rate, regular rhythm, normal heart sounds and intact distal pulses.   Pulmonary/Chest: Effort normal and breath sounds normal. No respiratory distress. She has no wheezes. She has no rales. She exhibits no tenderness.  Musculoskeletal:       Right knee: She exhibits normal range of motion, no swelling and no bony tenderness. Tenderness found. Medial joint line tenderness noted.       Left knee: Normal.  Neurological: She is alert and oriented to person, place, and time.  Skin: Skin is warm and dry. No rash noted.  Psychiatric: Affect normal.  Vitals reviewed.   Recent Results (from the past 2160 hour(s))  Lipid panel     Status: Abnormal   Collection Time: 03/22/17 11:29 AM  Result Value Ref Range   Cholesterol 206 (H) 0 - 200 mg/dL    Comment: ATP III Classification  Desirable:  < 200 mg/dL               Borderline High:  200 - 239 mg/dL          High:  > = 240 mg/dL   Triglycerides 119.0 0.0 - 149.0 mg/dL    Comment: Normal:  <150 mg/dLBorderline High:  150 - 199 mg/dL   HDL 71.90 >39.00 mg/dL   VLDL 23.8 0.0 - 40.0 mg/dL   LDL Cholesterol 110 (H) 0 - 99 mg/dL   Total CHOL/HDL Ratio 3     Comment:                Men          Women1/2 Average Risk     3.4          3.3Average Risk          5.0          4.42X Average Risk          9.6          7.13X Average Risk          15.0          11.0                       NonHDL 133.68     Comment: NOTE:  Non-HDL goal should be 30 mg/dL higher than patient's LDL goal (i.e. LDL goal of < 70 mg/dL, would have non-HDL goal of < 100 mg/dL)  Basic metabolic panel     Status: None   Collection Time: 03/22/17 11:29 AM  Result Value Ref Range   Sodium 137 135 - 145 mEq/L    Potassium 5.1 3.5 - 5.1 mEq/L   Chloride 100 96 - 112 mEq/L   CO2 32 19 - 32 mEq/L   Glucose, Bld 96 70 - 99 mg/dL   BUN 12 6 - 23 mg/dL   Creatinine, Ser 0.59 0.40 - 1.20 mg/dL   Calcium 9.7 8.4 - 10.5 mg/dL   GFR 104.12 >60.00 mL/min  Hepatic function panel     Status: None   Collection Time: 03/22/17 11:29 AM  Result Value Ref Range   Total Bilirubin 0.5 0.2 - 1.2 mg/dL   Bilirubin, Direct 0.1 0.0 - 0.3 mg/dL   Alkaline Phosphatase 101 39 - 117 U/L   AST 18 0 - 37 U/L   ALT 17 0 - 35 U/L   Total Protein 7.4 6.0 - 8.3 g/dL   Albumin 4.4 3.5 - 5.2 g/dL  TSH     Status: None   Collection Time: 03/22/17 11:29 AM  Result Value Ref Range   TSH 2.67 0.35 - 4.50 uIU/mL  CBC with Differential/Platelet     Status: None   Collection Time: 03/22/17 11:29 AM  Result Value Ref Range   WBC 6.7 4.0 - 10.5 K/uL   RBC 4.48 3.87 - 5.11 Mil/uL   Hemoglobin 12.9 12.0 - 15.0 g/dL   HCT 38.9 36.0 - 46.0 %   MCV 87.0 78.0 - 100.0 fl   MCHC 33.0 30.0 - 36.0 g/dL   RDW 14.4 11.5 - 15.5 %   Platelets 276.0 150.0 - 400.0 K/uL   Neutrophils Relative % 61.4 43.0 - 77.0 %   Lymphocytes Relative 26.8 12.0 - 46.0 %   Monocytes Relative 8.2 3.0 - 12.0 %   Eosinophils Relative 2.5 0.0 - 5.0 %   Basophils Relative 1.1 0.0 - 3.0 %  Neutro Abs 4.1 1.4 - 7.7 K/uL   Lymphs Abs 1.8 0.7 - 4.0 K/uL   Monocytes Absolute 0.6 0.1 - 1.0 K/uL   Eosinophils Absolute 0.2 0.0 - 0.7 K/uL   Basophils Absolute 0.1 0.0 - 0.1 K/uL    Assessment/Plan: 1. Acute pain of right knee Exam unremarkable overall. Pain with ROM but ROM full and strength preserved. Discussed supportive measures including bracing and OTC medication. X-ray today at patient request. Will alter management based on results. - DG Knee Complete 4 Views Right; Future  2. Primary osteoarthritis of left knee Repeat x-ray today to assess level of OA. May need repeat assessment by Orthopedics. Will defer chronic management to PCP. - DG Knee Complete 4  Views Left; Future   Leeanne Rio, PA-C

## 2017-05-01 NOTE — Patient Instructions (Signed)
Please go to [facility] for x-ray. I will call you with your results. Keep knee elevated and iced. Wear knee brace as directed. Alternate Tylenol arthritis and Ibuprofen for pain.  We may need Sports medicine or Orthopedic assessment.

## 2017-05-01 NOTE — Progress Notes (Signed)
Pre visit review using our clinic review tool, if applicable. No additional management support is needed unless otherwise documented below in the visit note. 

## 2017-05-04 ENCOUNTER — Encounter: Payer: Self-pay | Admitting: Sports Medicine

## 2017-05-04 ENCOUNTER — Ambulatory Visit (INDEPENDENT_AMBULATORY_CARE_PROVIDER_SITE_OTHER): Payer: Medicare HMO | Admitting: Sports Medicine

## 2017-05-04 ENCOUNTER — Ambulatory Visit: Payer: Self-pay

## 2017-05-04 VITALS — BP 164/90 | HR 75 | Ht 62.0 in | Wt 210.2 lb

## 2017-05-04 DIAGNOSIS — M25562 Pain in left knee: Secondary | ICD-10-CM

## 2017-05-04 DIAGNOSIS — M25561 Pain in right knee: Secondary | ICD-10-CM

## 2017-05-04 DIAGNOSIS — M170 Bilateral primary osteoarthritis of knee: Secondary | ICD-10-CM | POA: Diagnosis not present

## 2017-05-04 NOTE — Progress Notes (Signed)
OFFICE VISIT NOTE Ann Conley. [place], [place] [facility] [place]  SHELLSEA BORUNDA - 83 y.o. female MRN [id number]  Date of birth: 16-Apr-1934  Visit Date: 05/04/2017  PCP: Midge Minium, MD   Referred by: Midge Minium, MD  Burlene Arnt, CMA acting as scribe for Dr. Paulla Fore.  SUBJECTIVE:   Chief Complaint  Patient presents with  . bilateral knee pain   HPI: As below and per problem based documentation when appropriate.  Pt presents today with complaint of bilateral knee pain She had xray done 05/01/17 which showed the following:   Right knee IMPRESSION: 1. Tricompartmental degenerative joint disease primarily involving the patellofemoral compartment. 2. Cannot exclude a tiny amount of joint fluid. Left knee IMPRESSION: 1. Tricompartmental degenerative joint disease of the left knee primarily involving the patellofemoral compartment. 2. The patella is laterally positioned relative to the patellofemoral groove.  Pt reports that right knee pain is worse than the left. She had steroid injection in the left knee 2-3 years ago. She has pain around the whole right knee but pain seems to be worse on the medial aspect of the knee. She has not noticed any swelling or increased warmth in the knee.   The pain is described as constant aching and is rated as 5/10 currently but 10/10 at its worst .  Worsened with bearing weight, bending, straightening. She has pain even at rest.  Improves with resting feet on a slanted board, elevation.  Therapies tried include : She has iced the knee and gotten some relief. She has also tried heat but that seemed to make the pain worse. She has been taking Tylenol Arthritis and using Aspercreme with some relief. It was recommended by PCP that she use a brace on the knee but pt says that she was unsure.   Other associated symptoms include: Pt denies hip pain, ankle pain, radiating pain into the  upper or lower leg. She has noticed some increased pain in the left knee since she has been favoring it more recently.   Pt denies fever, chills, night sweats.    Review of Systems  Constitutional: Negative for chills and fever.  Respiratory: Negative for shortness of breath and wheezing.   Cardiovascular: Negative for chest pain, palpitations and leg swelling.  Musculoskeletal: Positive for joint pain. Negative for falls.  Neurological: Positive for headaches (migraines). Negative for dizziness and tingling.  Endo/Heme/Allergies: Does not bruise/bleed easily.    Otherwise per HPI.  HISTORY & PERTINENT PRIOR DATA:  No specialty comments available. She reports that she has never smoked. She has never used smokeless tobacco. No results for input(s): HGBA1C, LABURIC in the last 8760 hours. Medications & Allergies reviewed per EMR Patient Active Problem List   Diagnosis Date Noted  . Primary osteoarthritis of both knees 06/01/2017  . 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 Blood in stool 04/13/2016  . Complicated migraine 44/96/7591  . Keratoma 10/19/2014  . Pain in lower limb 10/19/2014  . Hammer toe of left foot 10/19/2014  . Obesity (BMI 30-39.9) 09/17/2014  . Snoring 03/13/2013  . Left anterior knee pain 03/13/2013  . Cough 09/18/2011  . Mole (skin) 09/18/2011  . Ocular migraine 05/07/2011  . HTN (hypertension) 04/25/2011  . GERD (gastroesophageal reflux disease) 04/25/2011  . Annual physical exam 04/25/2011  . BREAST CANCER, HX OF 06/21/2007   Past Medical History:  Diagnosis Date  . Anemia   . Blood transfusion complicating pregnancy 6384  .  GERD (gastroesophageal reflux disease)   . History of chicken pox   . Hypertension   . Migraine    Family History  Problem Relation Age of Onset  . Breast cancer Mother   . Heart disease Father    Past Surgical History:  Procedure Laterality Date  . BREAST BIOPSY    . CHOLECYSTECTOMY    . PARTIAL HYSTERECTOMY     Social  History   Occupational History  . Not on file.   Social History Main Topics  . Smoking status: Never Smoker  . Smokeless tobacco: Never Used  . Alcohol use 0.0 oz/week     Comment: wine occ.  . Drug use: No  . Sexual activity: Not on file    OBJECTIVE:  VS:  HT:5\' 2"  (157.5 cm)   WT:210 lb 3.2 oz (95.3 kg)  BMI:38.5    BP:(!) 164/90  HR:75bpm  TEMP: ( )  RESP:94 % EXAM: Findings:  WDWN, NAD, Non-toxic appearing Alert & appropriately interactive Not depressed or anxious appearing No increased work of breathing. Pupils are equal. EOM intact without nystagmus No clubbing or cyanosis of the extremities appreciated No significant rashes/lesions/ulcerations overlying the examined area. DP & PT pulses 2+/4.  No significant pretibial edema. Sensation intact to light touch in lower extremities.  Bilateral knees: Overall well aligned.  Generalized osteophytic bossing.  Small effusion.  She has full flexion extension.  2-3 mm opening with valgus stressing bilaterally with solid endpoints.  Stable anterior posterior drawer.  Mechanical crepitation.     No results found. ASSESSMENT & PLAN:     ICD-10-CM   1. Pain in both knees, unspecified chronicity M25.561 [place] LIMITED JOINT SPACE STRUCTURES LOW BILAT(NO LINKED CHARGES)   M25.562   2. Primary osteoarthritis of both knees M17.0   ================================================================= Primary osteoarthritis of both knees Bilateral knee injections performed today.  Consider Visco supplementation if only moderate improvement.  Compression recommended when active.  Strengthening of the VMO and hip abductor is recommended. =================================================================  Follow-up: Return in about 10 weeks (around 07/13/2017).   CMA/ATC served as Education administrator during this visit. History, Physical, and Plan performed by medical provider. Documentation and orders reviewed and attested to.      Teresa Coombs, [facility] Physician

## 2017-05-04 NOTE — Patient Instructions (Signed)

## 2017-05-10 ENCOUNTER — Telehealth: Payer: Self-pay | Admitting: Sports Medicine

## 2017-05-10 NOTE — Telephone Encounter (Signed)
Called [phone] and spoke with patient. She was in the office last Friday and had injection and fluid removed from the right knee. She has had extreme pain in the right knee since then. The left knee has been fine. She is packing for [place], she will be gone for 3 months. She reports that the pain is not as bad as it was when she first came in but its a constant dull ache that keeps her up at night right now. She wants to make sure she is on the right path and see if there is anything that Dr. Paulla Fore can recommend because she is going out of the country soon. She needs a call back today please.

## 2017-05-10 NOTE — Telephone Encounter (Signed)
Patient still has intense pains in right knee. Call patient as soon as possible to advise. Patient is awaiting phone call.

## 2017-05-11 NOTE — Telephone Encounter (Signed)
Spoke with Dr. Paulla Fore. Called pt and advised to ice the knee, use compression, and take Tylenol as needed for the pain. Pt understood.

## 2017-06-01 DIAGNOSIS — M170 Bilateral primary osteoarthritis of knee: Secondary | ICD-10-CM | POA: Insufficient documentation

## 2017-06-01 NOTE — Procedures (Signed)
PROCEDURE NOTE -  ULTRASOUND GUIDEDINJECTION: Bilateral Knees Images were obtained and interpreted by myself, Teresa Coombs, DO  Images have been saved and stored to PACS system. Images obtained on: GE S7 Ultrasound machine  ULTRASOUND FINDINGS:  Small supraphysiologic effusions bilaterally.  DESCRIPTION OF PROCEDURE:  The patient's clinical condition is marked by substantial pain and/or significant functional disability. Other conservative therapy has not provided relief, is contraindicated, or not appropriate. There is a reasonable likelihood that injection will significantly improve the patient's pain and/or functional impairment. After discussing the risks, benefits and expected outcomes of the injection and all questions were reviewed and answered, the patient wished to undergo the above named procedure. Verbal consent was obtained. The ultrasound was used to identify the target structure and adjacent neurovascular structures. The skin was then prepped in sterile fashion and the target structure was injected under direct visualization using sterile technique as below: PREP: Alcohol, Ethel Chloride APPROACH: Superior lateral, single injection, 25g 1.5" needle INJECTATE: 3cc 1% lidocaine, 1 cc 40mg  DepoMedrol ASPIRATE: N/A DRESSING: Band-Aid  Post procedural instructions including recommending icing and warning signs for infection were reviewed. This procedure was well tolerated and there were no complications.   IMPRESSION: Succesful US Guided Injection

## 2017-06-01 NOTE — Assessment & Plan Note (Signed)
Bilateral knee injections performed today.  Consider Visco supplementation if only moderate improvement.  Compression recommended when active.  Strengthening of the VMO and hip abductor is recommended.

## 2017-06-05 ENCOUNTER — Telehealth: Payer: Self-pay | Admitting: Family Medicine

## 2017-06-05 NOTE — Telephone Encounter (Signed)
Pt states that she is still having a lot of knee pain from seeing Einar Pheasant and asking if a Rx can be called into [facility] on [address] for her sister to pick up and over night it to her in [place] where she is on vacation at this time.

## 2017-06-05 NOTE — Telephone Encounter (Signed)
Please advise 

## 2017-06-05 NOTE — Telephone Encounter (Signed)
She would need to contact Dr. Paulla Fore as he is the treating provider. She may need assessment in [place] if things are not improving.

## 2017-06-05 NOTE — Telephone Encounter (Signed)
Called and advised pt of [place] recommendations. Pt stated an understanding and will try to contact Dr. Paulla Fore as it is very difficult to see a provider in [place].

## 2017-06-08 ENCOUNTER — Telehealth: Payer: Self-pay | Admitting: Sports Medicine

## 2017-06-08 NOTE — Telephone Encounter (Signed)
I am so sorry she is hurting.  Have not met patient. Do not want to do antiinflammatories with history of GI bleed.  Cannot send pain meds intercontinental.  She actually may be able to but tramadol over the counter there.  I would recommend tylenol 530m 3 times a day  When is she coming back, she needs to be seen.  If worsening pain [place]  I do not feel I can help form here. I am sorry

## 2017-06-08 NOTE — Telephone Encounter (Signed)
Will forward to Dr. Juleen China and Dr Tamala Julian (both are only in the office for half a day today) as Dr. Paulla Fore is out of the office this week.

## 2017-06-08 NOTE — Telephone Encounter (Signed)
Called patient and advised per Dr. Tamala Julian. She will try to see if she can get Tramadol over-the-counter. She would like a message to be sent to Dr. Paulla Fore to address when he returns on Monday. She says that she has been dealing with this for a while now. She has been taking Tylenol Arthritis and gotten little relief from that. She says that she can't be seen in [place], she has tried in the past and she really doesn't want to have to come back home yet. Will forward to Dr. Paulla Fore to review when he returns. She would like a call back on Monday.

## 2017-06-08 NOTE — Telephone Encounter (Signed)
Patient "has been in horrible pain since shot the Dr. Paulla Fore has done." Patient is out of town in [place] and needs a pain medication as soon as possible. Patient wants a nurse to call her today to advise. Call the number provided not the other phone numbers due to her being in [place]. Call [phone].

## 2017-06-11 ENCOUNTER — Other Ambulatory Visit: Payer: Self-pay

## 2017-06-11 MED ORDER — TRAMADOL HCL 50 MG PO TABS: 50.0000 mg | ORAL_TABLET | Freq: Three times a day (TID) | ORAL | 0 refills | Status: DC | PRN

## 2017-06-11 NOTE — Telephone Encounter (Signed)
Tramadol po q8 hours prn #60 no refills approved.

## 2017-06-11 NOTE — Telephone Encounter (Signed)
Forwarding to Dr. Rigby.  

## 2017-06-11 NOTE — Telephone Encounter (Signed)
Patient needs something not over the counter due to [place] rules for healthcare. Patient needs a call as soon as possible, she is requesting a rx sent to  [facility], [address]. [address] [phone] (Phone) [phone] (Fax)   Patient's sister can bring her the medication if it is placed there.

## 2017-06-11 NOTE — Telephone Encounter (Signed)
Rx called in to [facility], pt aware.

## 2017-06-12 ENCOUNTER — Telehealth: Payer: Self-pay | Admitting: Sports Medicine

## 2017-06-12 NOTE — Telephone Encounter (Signed)
Xray report has been faxed, I'm not sure about e-mailing the results. Will forward to [place].

## 2017-06-12 NOTE — Telephone Encounter (Signed)
Patient called in reference to needing xray results faxed to [phone](patient did not know doctors name). Patient is in [place] and going to see this doctor tomorrow 06/13/17. Patient also wanted the xrays sent to [email or web address]. Please call patient with any questions.

## 2017-06-12 NOTE — Telephone Encounter (Signed)
I called the medical records dept at [facility] to see if e-mailing was possible, staff advised that this was not possible. We are only able to fax results.

## 2017-06-13 NOTE — Telephone Encounter (Signed)
Called pt and advised. She will call back later to provide fax number to the other facility.

## 2017-07-21 ENCOUNTER — Other Ambulatory Visit: Payer: Self-pay | Admitting: Family Medicine

## 2017-09-19 NOTE — Progress Notes (Signed)
Subjective:   Ann Conley is a 83 y.o. female who presents for Medicare Annual (Subsequent) preventive examination.  Retired Pharmacist, hospital, slow Art therapist.   Review of Systems:  No ROS.  Medicare Wellness Visit. Additional risk factors are reflected in the social history.  Cardiac Risk Factors include: advanced age (>50men, >41 women);hypertension;family history of premature cardiovascular disease;obesity (BMI >30kg/m2)   Sleep patterns: Sleeps in recliner often d/t knee pain.  Home Safety/Smoke Alarms: Feels safe in home. Smoke alarms in place.  Living environment; residence and Firearm Safety: Lives with husband in 2 story home.  Seat Belt Safety/Bike Helmet: Wears seat belt.   Female:   Pap-N/A       Mammo-02/16/2016, negative. Scheduled 10/2017.  Dexa scan-Declines.       CCS-Colonoscopy 11/17/2002, polyps.      Objective:     Vitals: BP (!) 143/81   Pulse 67   Temp 98.1 F (36.7 C) (Oral)   Resp 17   Ht 5\' 2"  (1.575 m)   Wt 207 lb 8 oz (94.1 kg)   SpO2 97%   BMI 37.95 kg/m   Body mass index is 37.95 kg/m.   Tobacco Social History   Tobacco Use  Smoking Status Never Smoker  Smokeless Tobacco Never Used     Counseling given: Yes   Past Medical History:  Diagnosis Date  . Anemia   . Blood transfusion complicating pregnancy 4235  . GERD (gastroesophageal reflux disease)   . History of chicken pox   . Hypertension   . Migraine    Past Surgical History:  Procedure Laterality Date  . BREAST BIOPSY    . CHOLECYSTECTOMY    . PARTIAL HYSTERECTOMY     Family History  Problem Relation Age of Onset  . Breast cancer Mother   . Heart disease Father    Social History   Substance and Sexual Activity  Sexual Activity Not on file    Outpatient Encounter Medications as of 09/20/2017  Medication Sig  . amLODipine (NORVASC) 10 MG tablet TAKE ONE TABLET BY MOUTH DAILY  . aspirin 81 MG tablet Take 81 mg by mouth every evening.   . Calcium  Carbonate-Vitamin D (CALCIUM 600 + D PO) Take 1 tablet by mouth as directed.   . Multiple Vitamins-Minerals (OCUVITE PO) Take 1 tablet by mouth 3 (three) times a week. No specific days of the week..  . omeprazole (PRILOSEC) 20 MG capsule TAKE ONE CAPSULE BY MOUTH DAILY  . traMADol (ULTRAM) 50 MG tablet Take 1 tablet (50 mg total) by mouth every 8 (eight) hours as needed. (Patient not taking: Reported on 09/20/2017)  . [DISCONTINUED] metoprolol succinate (TOPROL-XL) 25 MG 24 hr tablet TAKE ONE TABLET BY MOUTH DAILY (Patient not taking: Reported on 09/20/2017)   No facility-administered encounter medications on file as of 09/20/2017.     Activities of Daily Living In your present state of health, do you have any difficulty performing the following activities: 09/20/2017 09/20/2017  Hearing? N N  Vision? N N  Difficulty concentrating or making decisions? N N  Walking or climbing stairs? Y Y  Comment arthritis in knee -  Dressing or bathing? N N  Doing errands, shopping? N N  Preparing Food and eating ? N -  Using the Toilet? N -  In the past six months, have you accidently leaked urine? N -  Do you have problems with loss of bowel control? N -  Managing your Medications? N -  Managing your Finances?  N -  Housekeeping or managing your Housekeeping? N -  Some recent data might be hidden    Patient Care Team: Midge Minium, MD as PCP - General (Family Medicine)    Assessment:    Physical assessment deferred to PCP.  Exercise Activities and Dietary recommendations Current Exercise Habits: The patient does not participate in regular exercise at present, Exercise limited by: orthopedic condition(s)   Diet (meal preparation, eat out, water intake, caffeinated beverages, dairy products, fruits and vegetables): Drinks water and diet soda.   Eats heart healthy diet. Encouraged to increase activity/gym.   Goals    None    Goal: Maintain current health by staying active.   Fall  Risk Fall Risk  09/20/2017 09/20/2017 03/22/2017 12/10/2015 08/12/2015  Falls in the past year? No No No No No   Depression Screen PHQ 2/9 Scores 09/20/2017 09/20/2017 03/22/2017 12/10/2015  PHQ - 2 Score 0 0 0 0  PHQ- 9 Score - 0 0 -  Exception Documentation - - - Patient refusal     Cognitive Function MMSE - Mini Mental State Exam 09/20/2017  Orientation to time 5  Orientation to Place 5  Registration 3  Attention/ Calculation 5  Recall 2  Language- name 2 objects 2  Language- repeat 1  Language- follow 3 step command 3  Language- read & follow direction 1  Write a sentence 1  Copy design 1  Total score 29        Immunization History  Administered Date(s) Administered  . Pneumococcal Conjugate-13 09/17/2014  . Pneumococcal Polysaccharide-23 09/09/2012  . Tdap 09/01/2016   Screening Tests Health Maintenance  Topic Date Due  . DEXA SCAN  10/14/2001  . INFLUENZA VACCINE  02/13/2018 (Originally 06/06/2017)  . TETANUS/TDAP  09/01/2026  . PNA vac Low Risk Adult  Completed      Plan:     Let us know about Shingles vaccine  Bring a copy of your living will and/or healthcare power of attorney to your next office visit.  Continue doing brain stimulating activities (puzzles, reading, adult coloring books, staying active) to keep memory sharp.    I have personally reviewed and noted the following in the patient's chart:   . Medical and social history . Use of alcohol, tobacco or illicit drugs  . Current medications and supplements . Functional ability and status . Nutritional status . Physical activity . Advanced directives . List of other physicians . Hospitalizations, surgeries, and ER visits in previous 12 months . Vitals . Screenings to include cognitive, depression, and falls . Referrals and appointments  In addition, I have reviewed and discussed with patient certain preventive protocols, quality metrics, and best practice recommendations. A written personalized  care plan for preventive services as well as general preventive health recommendations were provided to patient.     Gerilyn Nestle, RN  09/20/2017   Reviewed documentation and agree w/ above.  Annye Asa, MD

## 2017-09-20 ENCOUNTER — Other Ambulatory Visit: Payer: Self-pay

## 2017-09-20 ENCOUNTER — Ambulatory Visit (INDEPENDENT_AMBULATORY_CARE_PROVIDER_SITE_OTHER): Payer: Medicare HMO | Admitting: Family Medicine

## 2017-09-20 ENCOUNTER — Encounter: Payer: Self-pay | Admitting: Family Medicine

## 2017-09-20 VITALS — BP 143/81 | HR 67 | Temp 98.1°F | Resp 17 | Ht 62.0 in | Wt 207.5 lb

## 2017-09-20 DIAGNOSIS — I10 Essential (primary) hypertension: Secondary | ICD-10-CM | POA: Diagnosis not present

## 2017-09-20 DIAGNOSIS — Z0000 Encounter for general adult medical examination without abnormal findings: Secondary | ICD-10-CM | POA: Diagnosis not present

## 2017-09-20 LAB — BASIC METABOLIC PANEL
BUN: 14 mg/dL (ref 6–23)
CALCIUM: 9.8 mg/dL (ref 8.4–10.5)
CO2: 32 meq/L (ref 19–32)
CREATININE: 0.59 mg/dL (ref 0.40–1.20)
Chloride: 98 mEq/L (ref 96–112)
GFR: 103.99 mL/min (ref >60.00)
GLUCOSE: 118 mg/dL — AB (ref 70–99)
Potassium: 4.6 mEq/L (ref 3.5–5.1)
SODIUM: 137 meq/L (ref 135–145)

## 2017-09-20 LAB — HEPATIC FUNCTION PANEL
ALBUMIN: 4.4 g/dL (ref 3.5–5.2)
ALT: 13 U/L (ref 0–35)
AST: 15 U/L (ref 0–37)
Alkaline Phosphatase: 96 U/L (ref 39–117)
Bilirubin, Direct: 0.1 mg/dL (ref 0.0–0.3)
TOTAL PROTEIN: 7.1 g/dL (ref 6.0–8.3)
Total Bilirubin: 0.5 mg/dL (ref 0.2–1.2)

## 2017-09-20 LAB — LIPID PANEL
CHOLESTEROL: 213 mg/dL — AB (ref 0–200)
HDL: 75.6 mg/dL (ref >39.00)
LDL CALC: 118 mg/dL — AB (ref 0–99)
NonHDL: 136.92
Total CHOL/HDL Ratio: 3
Triglycerides: 97 mg/dL (ref 0.0–149.0)
VLDL: 19.4 mg/dL (ref 0.0–40.0)

## 2017-09-20 LAB — CBC WITH DIFFERENTIAL/PLATELET
BASOS ABS: 0 10*3/uL (ref 0.0–0.1)
Basophils Relative: 0.7 % (ref 0.0–3.0)
EOS ABS: 0.1 10*3/uL (ref 0.0–0.7)
Eosinophils Relative: 2.1 % (ref 0.0–5.0)
HEMATOCRIT: 40.5 % (ref 36.0–46.0)
HEMOGLOBIN: 13.1 g/dL (ref 12.0–15.0)
LYMPHS PCT: 27.9 % (ref 12.0–46.0)
Lymphs Abs: 1.8 10*3/uL (ref 0.7–4.0)
MCHC: 32.4 g/dL (ref 30.0–36.0)
MCV: 89.7 fl (ref 78.0–100.0)
MONOS PCT: 6.9 % (ref 3.0–12.0)
Monocytes Absolute: 0.5 10*3/uL (ref 0.1–1.0)
Neutro Abs: 4.1 10*3/uL (ref 1.4–7.7)
Neutrophils Relative %: 62.4 % (ref 43.0–77.0)
PLATELETS: 309 10*3/uL (ref 150.0–400.0)
RBC: 4.52 Mil/uL (ref 3.87–5.11)
RDW: 14.1 % (ref 11.5–15.5)
WBC: 6.6 10*3/uL (ref 4.0–10.5)

## 2017-09-20 LAB — TSH: TSH: 2.54 u[IU]/mL (ref 0.35–4.50)

## 2017-09-20 NOTE — Patient Instructions (Addendum)
Follow up in 6 months to recheck BP We'll notify you of your lab results and make any changes if needed Continue to work on healthy diet and exercise as able Please have Dr Isaiah Blakes send me a copy of your mammogram report Call with any questions or concerns Happy Thanksgiving!!!  Let us know about Shingles vaccine  Bring a copy of your living will and/or healthcare power of attorney to your next office visit.  Continue doing brain stimulating activities (puzzles, reading, adult coloring books, staying active) to keep memory sharp.   Health Maintenance, Female Adopting a healthy lifestyle and getting preventive care can go a long way to promote health and wellness. Talk with your health care provider about what schedule of regular examinations is right for you. This is a good chance for you to check in with your provider about disease prevention and staying healthy. In between checkups, there are plenty of things you can do on your own. Experts have done a lot of research about which lifestyle changes and preventive measures are most likely to keep you healthy. Ask your health care provider for more information. Weight and diet Eat a healthy diet  Be sure to include plenty of vegetables, fruits, low-fat dairy products, and lean protein.  Do not eat a lot of foods high in solid fats, added sugars, or salt.  Get regular exercise. This is one of the most important things you can do for your health. ? Most adults should exercise for at least 150 minutes each week. The exercise should increase your heart rate and make you sweat (moderate-intensity exercise). ? Most adults should also do strengthening exercises at least twice a week. This is in addition to the moderate-intensity exercise.  Maintain a healthy weight  Body mass index (BMI) is a measurement that can be used to identify possible weight problems. It estimates body fat based on height and weight. Your health care provider can help  determine your BMI and help you achieve or maintain a healthy weight.  For females 74 years of age and older: ? A BMI below 18.5 is considered underweight. ? A BMI of 18.5 to 24.9 is normal. ? A BMI of 25 to 29.9 is considered overweight. ? A BMI of 30 and above is considered obese.  Watch levels of cholesterol and blood lipids  You should start having your blood tested for lipids and cholesterol at 81 years of age, then have this test every 5 years.  You may need to have your cholesterol levels checked more often if: ? Your lipid or cholesterol levels are high. ? You are older than 81 years of age. ? You are at high risk for heart disease.  Cancer screening Lung Cancer  Lung cancer screening is recommended for adults 73-16 years old who are at high risk for lung cancer because of a history of smoking.  A yearly low-dose CT scan of the lungs is recommended for people who: ? Currently smoke. ? Have quit within the past 15 years. ? Have at least a 30-pack-year history of smoking. A pack year is smoking an average of one pack of cigarettes a day for 1 year.  Yearly screening should continue until it has been 15 years since you quit.  Yearly screening should stop if you develop a health problem that would prevent you from having lung cancer treatment.  Breast Cancer  Practice breast self-awareness. This means understanding how your breasts normally appear and feel.  It also means doing  regular breast self-exams. Let your health care provider know about any changes, no matter how small.  If you are in your 20s or 30s, you should have a clinical breast exam (CBE) by a health care provider every 1-3 years as part of a regular health exam.  If you are 42 or older, have a CBE every year. Also consider having a breast X-Krol (mammogram) every year.  If you have a family history of breast cancer, talk to your health care provider about genetic screening.  If you are at high risk for  breast cancer, talk to your health care provider about having an MRI and a mammogram every year.  Breast cancer gene (BRCA) assessment is recommended for women who have family members with BRCA-related cancers. BRCA-related cancers include: ? Breast. ? Ovarian. ? Tubal. ? Peritoneal cancers.  Results of the assessment will determine the need for genetic counseling and BRCA1 and BRCA2 testing.  Cervical Cancer Your health care provider may recommend that you be screened regularly for cancer of the pelvic organs (ovaries, uterus, and vagina). This screening involves a pelvic examination, including checking for microscopic changes to the surface of your cervix (Pap test). You may be encouraged to have this screening done every 3 years, beginning at age 57.  For women ages 56-65, health care providers may recommend pelvic exams and Pap testing every 3 years, or they may recommend the Pap and pelvic exam, combined with testing for human papilloma virus (HPV), every 5 years. Some types of HPV increase your risk of cervical cancer. Testing for HPV may also be done on women of any age with unclear Pap test results.  Other health care providers may not recommend any screening for nonpregnant women who are considered low risk for pelvic cancer and who do not have symptoms. Ask your health care provider if a screening pelvic exam is right for you.  If you have had past treatment for cervical cancer or a condition that could lead to cancer, you need Pap tests and screening for cancer for at least 20 years after your treatment. If Pap tests have been discontinued, your risk factors (such as having a new sexual partner) need to be reassessed to determine if screening should resume. Some women have medical problems that increase the chance of getting cervical cancer. In these cases, your health care provider may recommend more frequent screening and Pap tests.  Colorectal Cancer  This type of cancer can be  detected and often prevented.  Routine colorectal cancer screening usually begins at 81 years of age and continues through 81 years of age.  Your health care provider may recommend screening at an earlier age if you have risk factors for colon cancer.  Your health care provider may also recommend using home test kits to check for hidden blood in the stool.  A small camera at the end of a tube can be used to examine your colon directly (sigmoidoscopy or colonoscopy). This is done to check for the earliest forms of colorectal cancer.  Routine screening usually begins at age 19.  Direct examination of the colon should be repeated every 5-10 years through 81 years of age. However, you may need to be screened more often if early forms of precancerous polyps or small growths are found.  Skin Cancer  Check your skin from head to toe regularly.  Tell your health care provider about any new moles or changes in moles, especially if there is a change in a mole's  shape or color.  Also tell your health care provider if you have a mole that is larger than the size of a pencil eraser.  Always use sunscreen. Apply sunscreen liberally and repeatedly throughout the day.  Protect yourself by wearing long sleeves, pants, a wide-brimmed hat, and sunglasses whenever you are outside.  Heart disease, diabetes, and high blood pressure  High blood pressure causes heart disease and increases the risk of stroke. High blood pressure is more likely to develop in: ? People who have blood pressure in the high end of the normal range (130-139/85-89 mm Hg). ? People who are overweight or obese. ? People who are African American.  If you are 15-73 years of age, have your blood pressure checked every 3-5 years. If you are 31 years of age or older, have your blood pressure checked every year. You should have your blood pressure measured twice-once when you are at a hospital or clinic, and once when you are not at a  hospital or clinic. Record the average of the two measurements. To check your blood pressure when you are not at a hospital or clinic, you can use: ? An automated blood pressure machine at a pharmacy. ? A home blood pressure monitor.  If you are between 76 years and 28 years old, ask your health care provider if you should take aspirin to prevent strokes.  Have regular diabetes screenings. This involves taking a blood sample to check your fasting blood sugar level. ? If you are at a normal weight and have a low risk for diabetes, have this test once every three years after 81 years of age. ? If you are overweight and have a high risk for diabetes, consider being tested at a younger age or more often. Preventing infection Hepatitis B  If you have a higher risk for hepatitis B, you should be screened for this virus. You are considered at high risk for hepatitis B if: ? You were born in a country where hepatitis B is common. Ask your health care provider which countries are considered high risk. ? Your parents were born in a high-risk country, and you have not been immunized against hepatitis B (hepatitis B vaccine). ? You have HIV or AIDS. ? You use needles to inject street drugs. ? You live with someone who has hepatitis B. ? You have had sex with someone who has hepatitis B. ? You get hemodialysis treatment. ? You take certain medicines for conditions, including cancer, organ transplantation, and autoimmune conditions.  Hepatitis C  Blood testing is recommended for: ? Everyone born from [birth year] through 1963. ? Anyone with known risk factors for hepatitis C.  Sexually transmitted infections (STIs)  You should be screened for sexually transmitted infections (STIs) including gonorrhea and chlamydia if: ? You are sexually active and are younger than 81 years of age. ? You are older than 81 years of age and your health care provider tells you that you are at risk for this type of  infection. ? Your sexual activity has changed since you were last screened and you are at an increased risk for chlamydia or gonorrhea. Ask your health care provider if you are at risk.  If you do not have HIV, but are at risk, it may be recommended that you take a prescription medicine daily to prevent HIV infection. This is called pre-exposure prophylaxis (PrEP). You are considered at risk if: ? You are sexually active and do not regularly use condoms or know  the HIV status of your partner(s). ? You take drugs by injection. ? You are sexually active with a partner who has HIV.  Talk with your health care provider about whether you are at high risk of being infected with HIV. If you choose to begin PrEP, you should first be tested for HIV. You should then be tested every 3 months for as long as you are taking PrEP. Pregnancy  If you are premenopausal and you may become pregnant, ask your health care provider about preconception counseling.  If you may become pregnant, take 400 to 800 micrograms (mcg) of folic acid every day.  If you want to prevent pregnancy, talk to your health care provider about birth control (contraception). Osteoporosis and menopause  Osteoporosis is a disease in which the bones lose minerals and strength with aging. This can result in serious bone fractures. Your risk for osteoporosis can be identified using a bone density scan.  If you are 73 years of age or older, or if you are at risk for osteoporosis and fractures, ask your health care provider if you should be screened.  Ask your health care provider whether you should take a calcium or vitamin D supplement to lower your risk for osteoporosis.  Menopause may have certain physical symptoms and risks.  Hormone replacement therapy may reduce some of these symptoms and risks. Talk to your health care provider about whether hormone replacement therapy is right for you. Follow these instructions at home:  Schedule  regular health, dental, and eye exams.  Stay current with your immunizations.  Do not use any tobacco products including cigarettes, chewing tobacco, or electronic cigarettes.  If you are pregnant, do not drink alcohol.  If you are breastfeeding, limit how much and how often you drink alcohol.  Limit alcohol intake to no more than 1 drink per day for nonpregnant women. One drink equals 12 ounces of beer, 5 ounces of wine, or 1 ounces of hard liquor.  Do not use street drugs.  Do not share needles.  Ask your health care provider for help if you need support or information about quitting drugs.  Tell your health care provider if you often feel depressed.  Tell your health care provider if you have ever been abused or do not feel safe at home. This information is not intended to replace advice given to you by your health care provider. Make sure you discuss any questions you have with your health care provider. Document Released: 05/08/2011 Document Revised: 03/30/2016 Document Reviewed: 07/27/2015 Elsevier Interactive Patient Education  [employer].

## 2017-09-20 NOTE — Assessment & Plan Note (Signed)
Chronic problem.  Adequate but not ideal control.  Asymptomatic.  Check labs.  No anticipated med changes.  Will follow. 

## 2017-09-20 NOTE — Progress Notes (Signed)
   Subjective:    Patient ID: Ann Conley, female    DOB: [birth year], 83 y.o.   MRN: [id number]  HPI CPE- no longer needs colonoscopy.  Due for mammo (scheduled for next month [facility]), DEXA (declines).  Declines flu shot.   Review of Systems Patient reports no vision/ hearing changes, adenopathy,fever, weight change,  persistant/recurrent hoarseness , swallowing issues, chest pain, palpitations, edema, persistant/recurrent cough, hemoptysis, dyspnea (rest/exertional/paroxysmal nocturnal), gastrointestinal bleeding (melena, rectal bleeding), abdominal pain, significant heartburn, bowel changes, GU symptoms (dysuria, hematuria, incontinence), Gyn symptoms (abnormal  bleeding, pain),  syncope, focal weakness, memory loss, numbness & tingling, skin/hair/nail changes, abnormal bruising or bleeding, anxiety, or depression.     Objective:   Physical Exam General Appearance:    Alert, cooperative, no distress, appears stated age, obese  Head:    Normocephalic, without obvious abnormality, atraumatic  Eyes:    PERRL, conjunctiva/corneas clear, EOM's intact, fundi    benign, both eyes  Ears:    Normal TM's and external ear canals, both ears  Nose:   Nares normal, septum midline, mucosa normal, no drainage    or sinus tenderness  Throat:   Lips, mucosa, and tongue normal; teeth and gums normal  Neck:   Supple, symmetrical, trachea midline, no adenopathy;    Thyroid: no enlargement/tenderness/nodules  Back:     Symmetric, no curvature, ROM normal, no CVA tenderness  Lungs:     Clear to auscultation bilaterally, respirations unlabored  Chest Wall:    No tenderness or deformity   Heart:    Regular rate and rhythm, S1 and S2 normal, no murmur, rub   or gallop  Breast Exam:    Deferred to mammo  Abdomen:     Soft, non-tender, bowel sounds active all four quadrants,    no masses, no organomegaly  Genitalia:    Deferred  Rectal:    Extremities:   Extremities normal, atraumatic, no cyanosis or  edema  Pulses:   2+ and symmetric all extremities  Skin:   Skin color, texture, turgor normal, no rashes or lesions  Lymph nodes:   Cervical, supraclavicular, and axillary nodes normal  Neurologic:   CNII-XII intact, normal strength, sensation and reflexes    throughout          Assessment & Plan:

## 2017-09-20 NOTE — Assessment & Plan Note (Signed)
Pt's PE WNL w/ exception of being overweight.  Has mammo scheduled for next month.  Declines DEXA.  No need for repeat colonoscopy.  Declines flu shot.  Check labs.  Anticipatory guidance provided.

## 2017-09-21 ENCOUNTER — Other Ambulatory Visit (INDEPENDENT_AMBULATORY_CARE_PROVIDER_SITE_OTHER): Payer: Medicare HMO

## 2017-09-21 DIAGNOSIS — R7309 Other abnormal glucose: Secondary | ICD-10-CM | POA: Diagnosis not present

## 2017-09-21 LAB — HEMOGLOBIN A1C: Hgb A1c MFr Bld: 5.6 % (ref 4.6–6.5)

## 2017-10-19 ENCOUNTER — Other Ambulatory Visit: Payer: Self-pay | Admitting: Family Medicine

## 2017-10-25 DIAGNOSIS — Z853 Personal history of malignant neoplasm of breast: Secondary | ICD-10-CM | POA: Diagnosis not present

## 2017-10-25 DIAGNOSIS — Z1231 Encounter for screening mammogram for malignant neoplasm of breast: Secondary | ICD-10-CM | POA: Diagnosis not present

## 2017-10-25 LAB — HM MAMMOGRAPHY

## 2017-11-02 ENCOUNTER — Encounter: Payer: Self-pay | Admitting: Emergency Medicine

## 2018-02-07 ENCOUNTER — Other Ambulatory Visit: Payer: Self-pay | Admitting: Family Medicine

## 2018-03-13 DIAGNOSIS — H524 Presbyopia: Secondary | ICD-10-CM | POA: Diagnosis not present

## 2018-03-28 ENCOUNTER — Ambulatory Visit: Payer: Self-pay | Admitting: Family Medicine

## 2018-03-28 DIAGNOSIS — Z0289 Encounter for other administrative examinations: Secondary | ICD-10-CM

## 2018-05-20 ENCOUNTER — Other Ambulatory Visit: Payer: Self-pay | Admitting: General Practice

## 2018-05-20 ENCOUNTER — Telehealth: Payer: Self-pay | Admitting: Family Medicine

## 2018-05-20 MED ORDER — OMEPRAZOLE 20 MG PO CPDR: 20.0000 mg | DELAYED_RELEASE_CAPSULE | Freq: Every day | ORAL | 0 refills | Status: DC

## 2018-05-20 MED ORDER — AMLODIPINE BESYLATE 10 MG PO TABS: 10.0000 mg | ORAL_TABLET | Freq: Every day | ORAL | 0 refills | Status: DC

## 2018-05-20 NOTE — Telephone Encounter (Signed)
Pt states that she will be leaving out of the country 05[place]22[place]18 early in the morning. Would like refill tomorrow if she can

## 2018-05-21 NOTE — Telephone Encounter (Signed)
These were filled yesterday

## 2018-05-21 NOTE — Telephone Encounter (Signed)
Verified with pharmacy and they advised they were received and pt will be contacted.

## 2018-07-31 ENCOUNTER — Other Ambulatory Visit: Payer: Self-pay

## 2018-07-31 ENCOUNTER — Ambulatory Visit (INDEPENDENT_AMBULATORY_CARE_PROVIDER_SITE_OTHER): Payer: Medicare HMO | Admitting: Family Medicine

## 2018-07-31 ENCOUNTER — Encounter: Payer: Self-pay | Admitting: Family Medicine

## 2018-07-31 VITALS — BP 148/80 | HR 79 | Temp 97.7°F | Ht 62.0 in | Wt 211.6 lb

## 2018-07-31 DIAGNOSIS — G43801 Other migraine, not intractable, with status migrainosus: Secondary | ICD-10-CM | POA: Diagnosis not present

## 2018-07-31 MED ORDER — KETOROLAC TROMETHAMINE 60 MG/2ML IM SOLN
30.0000 mg | Freq: Once | INTRAMUSCULAR | Status: AC
  Administered 2018-07-31: 30 mg via INTRAMUSCULAR

## 2018-07-31 NOTE — Patient Instructions (Signed)
Please return next week to see Dr. Birdie Riddle to discuss further evaluation and management strategies for your worsening headaches.   Try to rest and relax now that you are back home. Stress could be contributing to your worsening headaches.  See an eye doctor if it has been more than a year since your last visit.   If you have any questions or concerns, please don't hesitate to send me a message via MyChart or call the office at [phone]. Thank you for visiting with [place] today! It's our pleasure caring for you.

## 2018-07-31 NOTE — Progress Notes (Signed)
Subjective  CC:  Chief Complaint  Patient presents with  . Headache    patient states she has been struggling with migraines over the summer, started last night again     HPI: Ann Conley is a 84 y.o. female who presents to the office today to address the problems listed above in the chief complaint.  84 year old female with history of ocular migraines first diagnosed back in 2017.  ER notes, office visit notes and imaging reports have been reviewed.  Patient was traveling by car in mid July when experienced acute onset left eye pain associated with bright sunlight.  She reports blurred vision without diplopia, unilateral throbbing headache.  She uses over-the-counter Excedrin Migraine which typically relieves the headache.  However, since July, she has had more frequent headaches.  She does admit to stressors as a possible trigger.  She spent her summer in [place] and returned home 2 days ago.  She drove.  She denies dysarthria, paresis, stiff neck, fevers.  She does admit to hemianopia intermittently with her ocular headaches.  She did have an MRI in 2017 which showed normal brain.  She reports a normal eye exam about 3 to 4 months ago.  Currently, she has a persistent dull headache with left vision pressure but clear vision.  Her last Excedrin was last night.  No atypical symptoms. Assessment  1. Ocular migraine with status migrainosus      Plan   Ocular migraines with increase in frequency: Abortive treatment today with Toradol IM.  Rest, continue Excedrin Migraine as needed.  Follow-up next week with PCP to further discuss further evaluation if needed and or preventatives.  Discussed red flags.  None identified today.    Follow up: Return for as scheduled next week for recheck.   No orders of the defined types were placed in this encounter.  Meds ordered this encounter  Medications  . ketorolac (TORADOL) injection 30 mg      I reviewed the patients updated PMH, FH, and  SocHx.    Patient Active Problem List   Diagnosis Date Noted  . Primary osteoarthritis of both knees 06/01/2017  . 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 Blood in stool 04/13/2016  . Complicated migraine 20/94/7096  . Keratoma 10/19/2014  . Pain in lower limb 10/19/2014  . Hammer toe of left foot 10/19/2014  . Obesity (BMI 30-39.9) 09/17/2014  . Snoring 03/13/2013  . Left anterior knee pain 03/13/2013  . Cough 09/18/2011  . Mole (skin) 09/18/2011  . Ocular migraine 05/07/2011  . HTN (hypertension) 04/25/2011  . GERD (gastroesophageal reflux disease) 04/25/2011  . Annual physical exam 04/25/2011  . BREAST CANCER, HX OF 06/21/2007   Current Meds  Medication Sig  . amLODipine (NORVASC) 10 MG tablet Take 1 tablet (10 mg total) by mouth daily.  Marland Kitchen aspirin 81 MG tablet Take 81 mg by mouth every evening.   Marland Kitchen omeprazole (PRILOSEC) 20 MG capsule Take 1 capsule (20 mg total) by mouth daily.  . vitamin B-12 (CYANOCOBALAMIN) 100 MCG tablet Take 100 mcg by mouth daily.  . [DISCONTINUED] Calcium Carbonate-Vitamin D (CALCIUM 600 + D PO) Take 1 tablet by mouth as directed.   . [DISCONTINUED] Multiple Vitamins-Minerals (OCUVITE PO) Take 1 tablet by mouth 3 (three) times a week. No specific days of the week..    Allergies: Patient is allergic to codeine. Family History: Patient family history includes Breast cancer in her mother; Heart disease in her father. Social History:  Patient  reports that  she has never smoked. She has never used smokeless tobacco. She reports that she drinks alcohol. She reports that she does not use drugs.  Review of Systems: Constitutional: Negative for fever malaise or anorexia Cardiovascular: negative for chest pain Respiratory: negative for SOB or persistent cough Gastrointestinal: negative for abdominal pain  Objective  Vitals: BP (!) 148/80   Pulse 79   Temp 97.7 F (36.5 C)   Ht 5\' 2"  (1.575 m)   Wt 211 lb 9.6 oz (96 kg)   SpO2 98%   BMI 38.70 kg/m    General: no acute distress , A&Ox3, wearing sunglasses.  Nontoxic-appearing HEENT: PEERL, conjunctiva normal, no photophobia, no nystagmus oropharynx moist,neck is supple Cardiovascular:  RRR without murmur or gallop.  Respiratory:  Good breath sounds bilaterally, CTAB with normal respiratory effort Skin:  Warm, no rashes Neuro: Cranial nerves II through XII intact, gross motor function intact.  Able to get to exam table unassisted.  Toradol 30 mg IM given.  After 10 minutes, headache pain was significantly improved.  No adverse effects   Commons side effects, risks, benefits, and alternatives for medications and treatment plan prescribed today were discussed, and the patient expressed understanding of the given instructions. Patient is instructed to call or message via MyChart if he/she has any questions or concerns regarding our treatment plan. No barriers to understanding were identified. We discussed Red Flag symptoms and signs in detail. Patient expressed understanding regarding what to do in case of urgent or emergency type symptoms.   Medication list was reconciled, printed and provided to the patient in AVS. Patient instructions and summary information was reviewed with the patient as documented in the AVS. This note was prepared with assistance of Dragon voice recognition software. Occasional wrong-word or sound-a-like substitutions may have occurred due to the inherent limitations of voice recognition software

## 2018-08-08 ENCOUNTER — Encounter: Payer: Self-pay | Admitting: General Practice

## 2018-08-08 ENCOUNTER — Ambulatory Visit (INDEPENDENT_AMBULATORY_CARE_PROVIDER_SITE_OTHER): Payer: Medicare HMO | Admitting: Family Medicine

## 2018-08-08 ENCOUNTER — Encounter: Payer: Self-pay | Admitting: Family Medicine

## 2018-08-08 ENCOUNTER — Other Ambulatory Visit: Payer: Self-pay

## 2018-08-08 VITALS — BP 136/86 | HR 79 | Temp 97.9°F | Resp 16 | Ht 62.0 in | Wt 210.2 lb

## 2018-08-08 DIAGNOSIS — K649 Unspecified hemorrhoids: Secondary | ICD-10-CM | POA: Diagnosis not present

## 2018-08-08 DIAGNOSIS — I10 Essential (primary) hypertension: Secondary | ICD-10-CM | POA: Diagnosis not present

## 2018-08-08 DIAGNOSIS — E669 Obesity, unspecified: Secondary | ICD-10-CM

## 2018-08-08 LAB — CBC WITH DIFFERENTIAL/PLATELET
Basophils Absolute: 0 10*3/uL (ref 0.0–0.1)
Basophils Relative: 0.9 % (ref 0.0–3.0)
EOS ABS: 0.1 10*3/uL (ref 0.0–0.7)
Eosinophils Relative: 2 % (ref 0.0–5.0)
HCT: 40.9 % (ref 36.0–46.0)
Hemoglobin: 13.3 g/dL (ref 12.0–15.0)
LYMPHS ABS: 1.3 10*3/uL (ref 0.7–4.0)
Lymphocytes Relative: 23.9 % (ref 12.0–46.0)
MCHC: 32.4 g/dL (ref 30.0–36.0)
MCV: 87.2 fl (ref 78.0–100.0)
Monocytes Absolute: 0.4 10*3/uL (ref 0.1–1.0)
Monocytes Relative: 7.1 % (ref 3.0–12.0)
NEUTROS ABS: 3.6 10*3/uL (ref 1.4–7.7)
NEUTROS PCT: 66.1 % (ref 43.0–77.0)
PLATELETS: 276 10*3/uL (ref 150.0–400.0)
RBC: 4.7 Mil/uL (ref 3.87–5.11)
RDW: 14.5 % (ref 11.5–15.5)
WBC: 5.4 10*3/uL (ref 4.0–10.5)

## 2018-08-08 LAB — BASIC METABOLIC PANEL
BUN: 14 mg/dL (ref 6–23)
CO2: 32 meq/L (ref 19–32)
Calcium: 9.5 mg/dL (ref 8.4–10.5)
Chloride: 100 mEq/L (ref 96–112)
Creatinine, Ser: 0.52 mg/dL (ref 0.40–1.20)
GFR: 120.04 mL/min (ref >60.00)
Glucose, Bld: 98 mg/dL (ref 70–99)
POTASSIUM: 4.1 meq/L (ref 3.5–5.1)
Sodium: 137 mEq/L (ref 135–145)

## 2018-08-08 LAB — LIPID PANEL
CHOLESTEROL: 197 mg/dL (ref 0–200)
HDL: 72.4 mg/dL (ref >39.00)
LDL Cholesterol: 112 mg/dL — ABNORMAL HIGH (ref 0–99)
NonHDL: 124.64
Total CHOL/HDL Ratio: 3
Triglycerides: 64 mg/dL (ref 0.0–149.0)
VLDL: 12.8 mg/dL (ref 0.0–40.0)

## 2018-08-08 LAB — HEPATIC FUNCTION PANEL
ALK PHOS: 100 U/L (ref 39–117)
ALT: 17 U/L (ref 0–35)
AST: 16 U/L (ref 0–37)
Albumin: 4.4 g/dL (ref 3.5–5.2)
BILIRUBIN DIRECT: 0.1 mg/dL (ref 0.0–0.3)
TOTAL PROTEIN: 7.3 g/dL (ref 6.0–8.3)
Total Bilirubin: 0.4 mg/dL (ref 0.2–1.2)

## 2018-08-08 LAB — TSH: TSH: 2.37 u[IU]/mL (ref 0.35–4.50)

## 2018-08-08 NOTE — Assessment & Plan Note (Signed)
Chronic problem.  Adequate control.  Currently asymptomatic w/ exception of increased headaches that pt feels strongly are stress related.  Will follow

## 2018-08-08 NOTE — Patient Instructions (Addendum)
Schedule your complete physical in 6 months and your Medicare Wellness Visit w/ [facility] notify you of your lab results and make any changes if needed Try and work on healthy diet and regular exercise We'll call you with your GI appt for the hemorrhoids Try and limit your stress!  This can worsen your headaches Call with any questions or concerns Happy Fall!

## 2018-08-08 NOTE — Assessment & Plan Note (Signed)
Ongoing issue for pt.  Stressed need for healthy diet and regular exercise.  Check labs to risk stratify.  Will follow 

## 2018-08-08 NOTE — Progress Notes (Signed)
   Subjective:    Patient ID: Ann Conley, female    DOB: [birth year], 84 y.o.   MRN: [id number]  HPI HTN- chronic problem, on Amlodipine 10mg  daily w/ adequate control.  Denies CP, SOB, visual changes, edema.  Increased HAs recently w/ stress  Hemorrhoids- pt has long hx of hemorrhoids and recently has developed some pain.  Pain started in the 'past few days'.  Scant bleeding.  Using OTC Preparation H suppositories.  These are helping.  Pt has been resistant to GI evaluation in the past but she has multiple church friends that have been dx'd w/ colon cancer so she is more open to the idea.  Obesity- pt is not exercising regularly.  Not following a particular diet.   Review of Systems For ROS see HPI     Objective:   Physical Exam  Constitutional: She is oriented to person, place, and time. She appears well-developed and well-nourished. No distress.  obese  HENT:  Head: Normocephalic and atraumatic.  Eyes: Pupils are equal, round, and reactive to light. Conjunctivae and EOM are normal.  Neck: Normal range of motion. Neck supple. No thyromegaly present.  Cardiovascular: Normal rate, regular rhythm, normal heart sounds and intact distal pulses.  No murmur heard. Pulmonary/Chest: Effort normal and breath sounds normal. No respiratory distress.  Abdominal: Soft. She exhibits no distension. There is no tenderness.  Genitourinary:  Genitourinary Comments: Deferred at pt's request  Musculoskeletal: She exhibits no edema.  Lymphadenopathy:    She has no cervical adenopathy.  Neurological: She is alert and oriented to person, place, and time.  Skin: Skin is warm and dry.  Psychiatric: She has a normal mood and affect. Her behavior is normal.  Vitals reviewed.         Assessment & Plan:

## 2018-08-08 NOTE — Assessment & Plan Note (Signed)
Recurrent problem for pt.  She continues to decline DRE in office but after learning that 3 of her friends at church are dealing w/ colon cancer, she is now open to seeing GI for an evaluation.  Referral placed.

## 2018-08-09 ENCOUNTER — Telehealth: Payer: Self-pay | Admitting: Family Medicine

## 2018-08-09 NOTE — Telephone Encounter (Signed)
Provided lab results per Dr. Birdie Riddle of 08/08/18.  Patient voiced understanding.

## 2018-08-10 ENCOUNTER — Emergency Department (HOSPITAL_COMMUNITY)
Admission: EM | Admit: 2018-08-10 | Discharge: 2018-08-10 | Disposition: A | Discharge status: Home / Self Care | Payer: Medicare HMO | Attending: Emergency Medicine | Admitting: Emergency Medicine

## 2018-08-10 ENCOUNTER — Encounter (HOSPITAL_COMMUNITY): Payer: Self-pay

## 2018-08-10 ENCOUNTER — Emergency Department (HOSPITAL_COMMUNITY): Payer: Medicare HMO

## 2018-08-10 ENCOUNTER — Other Ambulatory Visit: Payer: Self-pay

## 2018-08-10 DIAGNOSIS — I10 Essential (primary) hypertension: Secondary | ICD-10-CM | POA: Diagnosis not present

## 2018-08-10 DIAGNOSIS — Z7982 Long term (current) use of aspirin: Secondary | ICD-10-CM | POA: Insufficient documentation

## 2018-08-10 DIAGNOSIS — Z862 Personal history of diseases of the blood and blood-forming organs and certain disorders involving the immune mechanism: Secondary | ICD-10-CM | POA: Diagnosis not present

## 2018-08-10 DIAGNOSIS — R51 Headache: Secondary | ICD-10-CM | POA: Diagnosis not present

## 2018-08-10 DIAGNOSIS — R200 Anesthesia of skin: Secondary | ICD-10-CM | POA: Diagnosis present

## 2018-08-10 DIAGNOSIS — R202 Paresthesia of skin: Secondary | ICD-10-CM

## 2018-08-10 LAB — URINALYSIS, ROUTINE W REFLEX MICROSCOPIC
BILIRUBIN URINE: NEGATIVE
Glucose, UA: NEGATIVE mg/dL
Hgb urine dipstick: NEGATIVE
KETONES UR: NEGATIVE mg/dL
Nitrite: NEGATIVE
PROTEIN: NEGATIVE mg/dL
Specific Gravity, Urine: 1.003 — ABNORMAL LOW (ref 1.005–1.030)
pH: 7 (ref 5.0–8.0)

## 2018-08-10 LAB — PROTIME-INR
INR: 0.92
Prothrombin Time: 12.2 seconds (ref 11.4–15.2)

## 2018-08-10 LAB — COMPREHENSIVE METABOLIC PANEL
ALT: 21 U/L (ref 0–44)
AST: 23 U/L (ref 15–41)
Albumin: 4.5 g/dL (ref 3.5–5.0)
Alkaline Phosphatase: 95 U/L (ref 38–126)
Anion gap: 10 (ref 5–15)
BUN: 15 mg/dL (ref 8–23)
CHLORIDE: 105 mmol/L (ref 98–111)
CO2: 27 mmol/L (ref 22–32)
CREATININE: 0.54 mg/dL (ref 0.44–1.00)
Calcium: 9.7 mg/dL (ref 8.9–10.3)
GFR calc Af Amer: >60 mL/min (ref >60)
Glucose, Bld: 100 mg/dL — ABNORMAL HIGH (ref 70–99)
Potassium: 4 mmol/L (ref 3.5–5.1)
Sodium: 142 mmol/L (ref 135–145)
Total Bilirubin: 0.7 mg/dL (ref 0.3–1.2)
Total Protein: 8 g/dL (ref 6.5–8.1)

## 2018-08-10 LAB — DIFFERENTIAL
BASOS ABS: 0 10*3/uL (ref 0.0–0.1)
BASOS PCT: 0 %
Eosinophils Absolute: 0.1 10*3/uL (ref 0.0–0.7)
Eosinophils Relative: 1 %
Lymphocytes Relative: 23 %
Lymphs Abs: 1.7 10*3/uL (ref 0.7–4.0)
MONOS PCT: 5 %
Monocytes Absolute: 0.4 10*3/uL (ref 0.1–1.0)
NEUTROS ABS: 5.1 10*3/uL (ref 1.7–7.7)
Neutrophils Relative %: 71 %

## 2018-08-10 LAB — APTT: APTT: 92 s — AB (ref 24–36)

## 2018-08-10 LAB — CBC
HEMATOCRIT: 41.7 % (ref 36.0–46.0)
HEMOGLOBIN: 13.5 g/dL (ref 12.0–15.0)
MCH: 28.7 pg (ref 26.0–34.0)
MCHC: 32.4 g/dL (ref 30.0–36.0)
MCV: 88.7 fL (ref 78.0–100.0)
Platelets: 309 10*3/uL (ref 150–400)
RBC: 4.7 MIL/uL (ref 3.87–5.11)
RDW: 14.3 % (ref 11.5–15.5)
WBC: 7.2 10*3/uL (ref 4.0–10.5)

## 2018-08-10 LAB — I-STAT TROPONIN, ED: Troponin i, poc: 0 ng/mL (ref 0.00–0.08)

## 2018-08-10 NOTE — Discharge Instructions (Signed)
Please read and follow all provided instructions.  Your diagnoses today include:  1. Paresthesias   2. Hypertension, unspecified type     Tests performed today include:  Blood counts and electrolytes  Kidney function  Emergent EKG and blood test for the heart -was normal  CT of the head which did not show any stroke  Vital signs. See below for your results today.   Medications prescribed:   None  Take any prescribed medications only as directed.  Home care instructions:  Follow any educational materials contained in this packet.  BE VERY CAREFUL not to take multiple medicines containing Tylenol (also called acetaminophen). Doing so can lead to an overdose which can damage your liver and cause liver failure and possibly death.   Follow-up instructions: Please follow-up with your primary care provider in the next 7 days for further evaluation of your symptoms.   Return instructions:   Please return to the Emergency Department if you experience worsening symptoms.   Return if you have weakness in your arms or legs, slurred speech, trouble walking or talking, confusion, or trouble with your balance.   Please return if you have any other emergent concerns.  Additional Information:  Your vital signs today were: BP (!) 175/85    Pulse 79    Temp 98.4 F (36.9 C)    Resp 19    Ht 5\' 2"  (1.575 m)    Wt 95 kg    SpO2 97%    BMI 38.31 kg/m  If your blood pressure (BP) was elevated above 135/85 this visit, please have this repeated by your doctor within one month. --------------

## 2018-08-10 NOTE — ED Triage Notes (Signed)
Patient presented to ed with c/o facial numbness and hands. Patient was see at [facility] this morning. Pt state numbness started last night and is little better now. It occur intermittently  Pt also c/o headache rating pain at 2/10.

## 2018-08-10 NOTE — ED Provider Notes (Signed)
[facility] DEPT Provider Note   CSN: [id number] Arrival date & time: 08/10/18  1244     History   Chief Complaint No chief complaint on file.   HPI Ann Conley is a 84 y.o. female.  Patient with history of migraine, anemia, no previous stroke --presents to the emerge department today with complaint of numbness and tingling to her left face.  Patient states that she has been experiencing migrating paresthesias and typically occur in either hand or sometimes in her feet.  She presents the emergency department today because last night she felt a heaviness in the left side of her face.  She did not observe any definite drooping but did notice numbness.  This is a different place than where she has had this in the past.  She is concerned that she was having a stroke. She also had a mild left-sided headache.  No head injury reported.  No fevers or confusion.  Patient states that she gets "ocular migraines" when she goes out in the sun without her sunglasses.  Patient denies signs of stroke including: facial droop, slurred speech, aphasia, weakness/numbness in extremities, imbalance/trouble walking. H/o HTN. No high cholesterol, DM, smoking, PAD, Afib.        Past Medical History:  Diagnosis Date  . Anemia   . Blood transfusion complicating pregnancy 5956  . GERD (gastroesophageal reflux disease)   . History of chicken pox   . Hypertension   . Migraine     Patient Active Problem List   Diagnosis Date Noted  . Primary osteoarthritis of both knees 06/01/2017  . 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 Bleeding hemorrhoid 04/13/2016  . Complicated migraine 38/75/6433  . Keratoma 10/19/2014  . Pain in lower limb 10/19/2014  . Hammer toe of left foot 10/19/2014  . Obesity (BMI 30-39.9) 09/17/2014  . Snoring 03/13/2013  . Left anterior knee pain 03/13/2013  . Cough 09/18/2011  . Mole (skin) 09/18/2011  . Ocular migraine 05/07/2011  . HTN  (hypertension) 04/25/2011  . GERD (gastroesophageal reflux disease) 04/25/2011  . Annual physical exam 04/25/2011  . BREAST CANCER, HX OF 06/21/2007    Past Surgical History:  Procedure Laterality Date  . BREAST BIOPSY    . CHOLECYSTECTOMY    . PARTIAL HYSTERECTOMY       OB History   None      Home Medications    Prior to Admission medications   Medication Sig Start Date End Date Taking? Authorizing Provider  amLODipine (NORVASC) 10 MG tablet Take 1 tablet (10 mg total) by mouth daily. 05/20/18   Midge Minium, MD  aspirin 81 MG tablet Take 81 mg by mouth every evening.     [provider]  Multiple Vitamins-Minerals (MULTIVITAMIN ADULT PO) Take by mouth.    [provider]  omeprazole (PRILOSEC) 20 MG capsule Take 1 capsule (20 mg total) by mouth daily. 05/20/18   Midge Minium, MD  vitamin B-12 (CYANOCOBALAMIN) 100 MCG tablet Take 100 mcg by mouth daily.    [provider]    Family History Family History  Problem Relation Age of Onset  . Breast cancer Mother   . Heart disease Father     Social History Social History   Tobacco Use  . Smoking status: Never Smoker  . Smokeless tobacco: Never Used  Substance Use Topics  . Alcohol use: Yes    Alcohol/week: 0.0 standard drinks    Comment: wine occ.  . Drug use: No  Allergies   Codeine   Review of Systems Review of Systems  Constitutional: Negative for fever.  HENT: Negative for congestion, dental problem, rhinorrhea and sinus pressure.   Eyes: Negative for photophobia, discharge, redness and visual disturbance.  Respiratory: Negative for shortness of breath.   Cardiovascular: Negative for chest pain.  Gastrointestinal: Negative for nausea and vomiting.  Musculoskeletal: Negative for gait problem, neck pain and neck stiffness.  Skin: Negative for rash.  Neurological: Positive for numbness and headaches. Negative for syncope, speech difficulty, weakness and  light-headedness.  Psychiatric/Behavioral: Negative for confusion.     Physical Exam Updated Vital Signs BP (!) 175/85   Pulse 79   Temp 98.4 F (36.9 C)   Resp 19   Ht 5\' 2"  (1.575 m)   Wt 95 kg   SpO2 97%   BMI 38.31 kg/m   Physical Exam  Constitutional: She is oriented to person, place, and time. She appears well-developed and well-nourished.  HENT:  Head: Normocephalic and atraumatic.  Right Ear: Tympanic membrane, external ear and ear canal normal.  Left Ear: Tympanic membrane, external ear and ear canal normal.  Nose: Nose normal.  Mouth/Throat: Uvula is midline, oropharynx is clear and moist and mucous membranes are normal.  Eyes: Pupils are equal, round, and reactive to light. Conjunctivae, EOM and lids are normal. Right eye exhibits no nystagmus. Left eye exhibits no nystagmus.  Neck: Normal range of motion. Neck supple.  No bruits  Cardiovascular: Normal rate and regular rhythm.  No murmur heard. Reg rate and rhythm  Pulmonary/Chest: Effort normal and breath sounds normal.  Abdominal: Soft. There is no tenderness.  Musculoskeletal:       Cervical back: She exhibits normal range of motion, no tenderness and no bony tenderness.  Neurological: She is alert and oriented to person, place, and time. She has normal strength and normal reflexes. No cranial nerve deficit or sensory deficit. She displays a negative Romberg sign. Coordination and gait normal. GCS eye subscore is 4. GCS verbal subscore is 5. GCS motor subscore is 6.  Skin: Skin is warm and dry.  Psychiatric: She has a normal mood and affect.  Nursing note and vitals reviewed.    ED Treatments / Results  Labs (all labs ordered are listed, but only abnormal results are displayed) Labs Reviewed  APTT - Abnormal; Notable for the following components:      Result Value   aPTT 92 (*)    All other components within normal limits  COMPREHENSIVE METABOLIC PANEL - Abnormal; Notable for the following components:    Glucose, Bld 100 (*)    All other components within normal limits  URINALYSIS, ROUTINE W REFLEX MICROSCOPIC - Abnormal; Notable for the following components:   Color, Urine STRAW (*)    Specific Gravity, Urine 1.003 (*)    Leukocytes, UA TRACE (*)    Bacteria, UA RARE (*)    All other components within normal limits  PROTIME-INR  CBC  DIFFERENTIAL  I-STAT TROPONIN, ED    ED ECG REPORT   Date: 08/10/2018  Rate: 77  Rhythm: normal sinus rhythm  QRS Axis: left  Intervals: normal  ST/T Wave abnormalities: normal  Conduction Disutrbances:none  Narrative Interpretation:   Old EKG Reviewed: unchanged  I have personally reviewed the EKG tracing and agree with the computerized printout as noted.  Radiology Ct Head Wo Contrast  Result Date: 08/10/2018 CLINICAL DATA:  Headache, facial numbness EXAM: CT HEAD WITHOUT CONTRAST TECHNIQUE: Contiguous axial images were obtained from the  base of the skull through the vertex without intravenous contrast. COMPARISON:  01/11/2016 FINDINGS: Brain: There is atrophy and chronic small vessel disease changes. No acute intracranial abnormality. Specifically, no hemorrhage, hydrocephalus, mass lesion, acute infarction, or significant intracranial injury. Vascular: No hyperdense vessel or unexpected calcification. Skull: No acute calvarial abnormality. Sinuses/Orbits: Visualized paranasal sinuses and mastoids clear. Orbital soft tissues unremarkable. Other: None IMPRESSION: No acute intracranial abnormality. Atrophy, chronic microvascular disease. Electronically Signed   By: Rolm Baptise M.D.   On: 08/10/2018 14:24    Procedures Procedures (including critical care time)  Medications Ordered in ED Medications - No data to display   Initial Impression / Assessment and Plan / ED Course  I have reviewed the triage vital signs and the nursing notes.  Pertinent labs & imaging results that were available during my care of the patient were reviewed by me  and considered in my medical decision making (see chart for details).     Patient seen and examined. Work-up initiated. Medications ordered.   Vital signs reviewed and are as follows: BP (!) 175/85   Pulse 79   Temp 98.4 F (36.9 C)   Resp 19   Ht 5\' 2"  (1.575 m)   Wt 95 kg   SpO2 97%   BMI 38.31 kg/m   3:51 PM Work-up is not revealing.   Patient discussed with and seen by Dr. Jeanell Sparrow.  Agrees no emergent indication for further work-up or advanced imaging.  Patient counseled to return if they have weakness in their arms or legs, slurred speech, trouble walking or talking, confusion, trouble with their balance, or if they have any other concerns. Patient verbalizes understanding and agrees with plan.     Final Clinical Impressions(s) / ED Diagnoses   Final diagnoses:  Paresthesias  Hypertension, unspecified type   Patient with migrating paresthesias without weakness or other concerning findings for stroke.  Patient has nonfocal neuro exam currently and symptoms are resolved.  Low concern for TIA.  Medical work-up is otherwise reassuring.  Patient discussed with and seen by attending physician.  At this point feel comfortable with discharge to home with close PCP follow-up for further evaluation.  Discussed signs and symptoms which should cause patient to return. Elevated ptt noted however patient not on heparin, no bleeding symptoms.     ED Discharge Orders    None       Carlisle Cater, [place] 08/10/18 1554    Pattricia Boss, MD 08/10/18 [phone]

## 2018-08-10 NOTE — ED Notes (Signed)
Patient given discharge teaching and verbalized understanding. Patient ambulated out of ED with a steady gait. 

## 2018-08-19 ENCOUNTER — Ambulatory Visit: Payer: Self-pay | Admitting: *Deleted

## 2018-08-19 ENCOUNTER — Ambulatory Visit: Payer: Medicare HMO | Admitting: Family Medicine

## 2018-08-19 NOTE — Telephone Encounter (Signed)
Pt reports "Some numbness, tingling" both hands initially, now right hand only. Also reports slight headache. States went to dentist today for procedure and was given amoxicillin. Concerned reaction from ATB. However, pt was seen 08/10/18 in ED for similar symptoms.  HCT done:  IMPRESSION: No acute intracranial abnormality. Atrophy, chronic microvascular disease.  Pt reports "Slight headache" Denies one sided weakness, facial droop,dysphagia, speech clear, no confusion noted. "Just slight headache and right hand weirdly tingling."   During NT call, pt states "It actually feels better now since we've been talking." Called practice,(after 5pm) PCP gone for day. Appt made for tomorrow with Dr. Birdie Riddle. Instructed pt to go back to ED if symptoms worsen, any one sided weakness, increased headache, facial droop, difficulty speaking. Pt verbalizes understanding. Reason for Disposition . [1] Numbness or tingling in one or both hands AND [2] is a chronic symptom (recurrent or ongoing AND present > 4 weeks)    Seen in ED 08/10/18 for similar symptoms  Answer Assessment - Initial Assessment Questions 1. SYMPTOM: "What is the main symptom you are concerned about?" (e.g., weakness, numbness)     Right hand, "Feels numb" 2. ONSET: "When did this start?" (minutes, hours, days; while sleeping)     08/10/18 3. LAST NORMAL: "When was the last time you were normal (no symptoms)?"      4. PATTERN "Does this come and go, or has it been constant since it started?"  "Is it present now?"    Constant since lunch time 5. CARDIAC SYMPTOMS: "Have you had any of the following symptoms: chest pain, difficulty breathing, palpitations?"     Slight headache, started at same time 6. NEUROLOGIC SYMPTOMS: "Have you had any of the following symptoms: headache, dizziness, vision loss, double vision, changes in speech, unsteady on your feet?"     Funny feeling from head down 7. OTHER SYMPTOMS: "Do you have any other symptoms?"  Numbness right hand  Protocols used: NEUROLOGIC DEFICIT-A-AH

## 2018-08-20 ENCOUNTER — Ambulatory Visit: Payer: Self-pay | Admitting: *Deleted

## 2018-08-20 NOTE — Telephone Encounter (Signed)
Reached pt regarding appt scheduled in error. Pt states "I feel fine now." Declined to reschedule. Instructed to CB if symptoms reoccur.

## 2018-08-20 NOTE — Telephone Encounter (Signed)
Appointment was scheduled incorrectly. I have spoken with the nurse that scheduled the appointment and she is reaching out to the patient to fix this.

## 2018-08-23 ENCOUNTER — Ambulatory Visit: Payer: Self-pay | Admitting: *Deleted

## 2018-08-23 ENCOUNTER — Encounter: Payer: Self-pay | Admitting: General Practice

## 2018-08-23 ENCOUNTER — Telehealth: Payer: Self-pay | Admitting: Family Medicine

## 2018-08-23 DIAGNOSIS — G459 Transient cerebral ischemic attack, unspecified: Secondary | ICD-10-CM

## 2018-08-23 DIAGNOSIS — G43109 Migraine with aura, not intractable, without status migrainosus: Secondary | ICD-10-CM

## 2018-08-23 DIAGNOSIS — R519 Headache, unspecified: Secondary | ICD-10-CM

## 2018-08-23 DIAGNOSIS — R51 Headache: Secondary | ICD-10-CM

## 2018-08-23 NOTE — Telephone Encounter (Signed)
Patient returned call- she reports that she is feeling better now and she appreciates Dr Virgil Benedict concern for her. She states if she continues to feel bad she will go in and be seen. She does feel that the "icepick headaches" and the occular migraines she had in the past are getting more frequent and she would like to know if she can get a referral for that. I told her I would let Dr Birdie Riddle know and she made need to come in for that- but I would relay her request.

## 2018-08-23 NOTE — Telephone Encounter (Signed)
Please call pt and voice my serious concerns that she has been having multiple TIAs which can signify a much larger stroke is on the way

## 2018-08-23 NOTE — Addendum Note (Signed)
Addended by: Davis Gourd on: 08/23/2018 04:33 PM   Modules accepted: Orders

## 2018-08-23 NOTE — Telephone Encounter (Addendum)
Contacted by [place] because pt having left hand weakness, and is dropping stuff; she is also complaining of a weird feeling her face; the pt was seen in the ED on 08/10/18 and recommendation made to follow up by 08/17/18; spoke with Bethany/Dr [name] at [facility] and recommendation was made for the pt to call 911 and to request an MRI; the pt says that she is not having smyptoms now; explained to pt that calling 911 would allow for prompt  assessment and treatment; she says that she will have her family take her to the ED if she decides to go; will route to office for notification of this encounter; also see nurse triage note dated 08/23/18

## 2018-08-23 NOTE — Telephone Encounter (Signed)
Please place stat referral to neurology for TIA, occular migraine, headaches

## 2018-08-23 NOTE — Telephone Encounter (Addendum)
Contacted by [place] because pt having left hand weakness, and is dropping stuff; she is also complaining of a weird feeling her face; the pt was seen in the ED on 08/10/18 and recommendation made to follow up by 08/17/18; spoke with Bethany/Dr [name] at [facility] and recommendation was made for the pt to call 911 and request MRI; the pt says that she is not having smyptoms now; explained to pt that calling 911 would allow for prompt  assessment and treatment; she says that she will have her family take her to the ED if she decides to go; will route to office for notification of this encounter; also see telephone encounter dated 08/23/18. Reason for Disposition . [1] Weakness (i.e., paralysis, loss of muscle strength) of the face, arm / hand, or leg / foot on one side of the body AND [2] sudden onset AND [3] present now  Answer Assessment - Initial Assessment Questions 1. SYMPTOM: "What is the main symptom you are concerned about?" (e.g., weakness, numbness)     Left hand weakness 2. ONSET: "When did this start?" (minutes, hours, days; while sleeping)     minutes 3. LAST NORMAL: "When was the last time you were normal (no symptoms)?"     08/23/18 4. PATTERN "Does this come and go, or has it been constant since it started?"  "Is it present now?"     intermittent 5. CARDIAC SYMPTOMS: "Have you had any of the following symptoms: chest pain, difficulty breathing, palpitations?"     no 6. NEUROLOGIC SYMPTOMS: "Have you had any of the following symptoms: headache, dizziness, vision loss, double vision, changes in speech, unsteady on your feet?"     Weird feeling in face 7. OTHER SYMPTOMS: "Do you have any other symptoms?"     no 8. PREGNANCY: "Is there any chance you are pregnant?" "When was your last menstrual period?"     np  Protocols used: NEUROLOGIC DEFICIT-A-AH

## 2018-08-23 NOTE — Telephone Encounter (Signed)
STAT Referral placed.  Pt informed.

## 2018-08-23 NOTE — Telephone Encounter (Signed)
Called pt and could not leave a message as voicemail was full. Mychart message also sent to restate PCP concerns.

## 2018-08-23 NOTE — Telephone Encounter (Signed)
FYI

## 2018-09-04 ENCOUNTER — Encounter: Payer: Self-pay | Admitting: Family Medicine

## 2018-09-04 ENCOUNTER — Other Ambulatory Visit: Payer: Self-pay | Admitting: Family Medicine

## 2018-09-11 ENCOUNTER — Ambulatory Visit (INDEPENDENT_AMBULATORY_CARE_PROVIDER_SITE_OTHER): Payer: Medicare HMO | Admitting: Diagnostic Neuroimaging

## 2018-09-11 ENCOUNTER — Encounter: Payer: Self-pay | Admitting: Diagnostic Neuroimaging

## 2018-09-11 ENCOUNTER — Telehealth: Payer: Self-pay | Admitting: Diagnostic Neuroimaging

## 2018-09-11 VITALS — BP 170/89 | HR 76 | Ht 62.0 in | Wt 207.6 lb

## 2018-09-11 DIAGNOSIS — G459 Transient cerebral ischemic attack, unspecified: Secondary | ICD-10-CM

## 2018-09-11 DIAGNOSIS — G4485 Primary stabbing headache: Secondary | ICD-10-CM

## 2018-09-11 DIAGNOSIS — I10 Essential (primary) hypertension: Secondary | ICD-10-CM

## 2018-09-11 DIAGNOSIS — I749 Embolism and thrombosis of unspecified artery: Secondary | ICD-10-CM

## 2018-09-11 DIAGNOSIS — R739 Hyperglycemia, unspecified: Secondary | ICD-10-CM | POA: Diagnosis not present

## 2018-09-11 NOTE — Telephone Encounter (Signed)
LVM for pt to be aware I also left their number of [phone] and if she has not heard in the next 2-3 business days to give them a call.

## 2018-09-11 NOTE — Progress Notes (Signed)
[facility]  PATIENT: Ann Conley DOB: 02-25-1934  REFERRING CLINICIAN: Alver Fisher HISTORY FROM: patient  REASON FOR VISIT: new consult    HISTORICAL  CHIEF COMPLAINT:  Chief Complaint  Patient presents with  . New Patient (Initial Visit)    Rm 7, alone  . Referred by Dr. Birdie Riddle    Possible TIA, Ocular Migraine.  Having R ici pick sharp shooting pains, which have worsened     HISTORY OF PRESENT ILLNESS:   84 year old female here for evaluation:  STABBING HEADACHES Patient has had long history since childhood of intermittent sharp stabbing headaches lasting for a few seconds, on the right side of her head.  These can occur several times per day and several times per month.  Patient looked up online and diagnosed herself with ice pick headaches.  She does not take any medications for these symptoms because they are so short lasting.  They have slightly worsened in the last few months.  Patient has been under more stress with her husband's illness and other family issues.  MIGRAINE TYPE HA Since 2016 patient has had a different type of headache which consists of partial vision loss in the left eye, followed by severe global pounding throbbing headaches associated with photophobia.  No nausea or vomiting.  Headaches can last 2 hours or longer at a time.  She diagnosed herself with "ocular migraines".  She is taken over-the-counter medications with mild relief.  These headaches have been worse than last 6 to 12 months, but have spontaneously resolved since August 2019.  LEFT ARM NUMBNESS 08/10/2018 patient noticed sudden onset of left arm numbness and weakness.  She was having trouble controlling her left arm.  She also noticed some numbness in her left face.  Symptoms lasted for 1 hour.  Patient went to the emergency room for evaluation.  CT of the head and lab testing were unremarkable.  Blood pressure was 175/85.  Apparently "low concern for TIA" was determined  and patient was encouraged to follow-up with PCP for evaluation. On 08/23/2018 patient had recurrence of symptoms, with left arm and left face numbness and tingling.  Patient called PCP who recommended ER evaluation but patient declined to go.  HYPERTENSION Patient ran out of her blood pressure medications 2 days ago.  She says that her blood pressure is "always high".  She is on amlodipine 10 mg daily.  Today blood pressure is 170/89.    REVIEW OF SYSTEMS: Full 14 system review of systems performed and negative with exception of: As per HPI.  ALLERGIES: Allergies  Allergen Reactions  . Codeine Nausea And Vomiting    HOME MEDICATIONS: Outpatient Medications Prior to Visit  Medication Sig Dispense Refill  . amLODipine (NORVASC) 10 MG tablet TAKE ONE TABLET BY MOUTH DAILY 90 tablet 0  . aspirin 81 MG tablet Take 81 mg by mouth every evening.     . Multiple Vitamins-Minerals (MULTIVITAMIN ADULT PO) Take 1 tablet by mouth daily.     . multivitamin-lutein (OCUVITE-LUTEIN) CAPS capsule Take 1 capsule by mouth daily.    Marland Kitchen omeprazole (PRILOSEC) 20 MG capsule Take 1 capsule (20 mg total) by mouth daily. 90 capsule 0  . vitamin B-12 (CYANOCOBALAMIN) 100 MCG tablet Take 100 mcg by mouth daily.     No facility-administered medications prior to visit.     PAST MEDICAL HISTORY: Past Medical History:  Diagnosis Date  . Anemia   . Blood transfusion complicating pregnancy 7829  . GERD (gastroesophageal reflux disease)   .  History of chicken pox   . Hypertension   . Migraine     PAST SURGICAL HISTORY: Past Surgical History:  Procedure Laterality Date  . BREAST BIOPSY    . CHOLECYSTECTOMY    . PARTIAL HYSTERECTOMY      FAMILY HISTORY: Family History  Problem Relation Age of Onset  . Breast cancer Mother   . Heart disease Father     SOCIAL HISTORY: Social History   Socioeconomic History  . Marital status: Married    Spouse name: Not on file  . Number of children: Not on file    . Years of education: Not on file  . Highest education level: Not on file  Occupational History  . Not on file  Social Needs  . Financial resource strain: Not on file  . Food insecurity:    Worry: Not on file    Inability: Not on file  . Transportation needs:    Medical: Not on file    Non-medical: Not on file  Tobacco Use  . Smoking status: Never Smoker  . Smokeless tobacco: Never Used  Substance and Sexual Activity  . Alcohol use: Yes    Alcohol/week: 0.0 standard drinks    Comment: wine occ.  . Drug use: No  . Sexual activity: Not on file  Lifestyle  . Physical activity:    Days per week: Not on file    Minutes per session: Not on file  . Stress: Not on file  Relationships  . Social connections:    Talks on phone: Not on file    Gets together: Not on file    Attends religious service: Not on file    Active member of club or organization: Not on file    Attends meetings of clubs or organizations: Not on file    Relationship status: Not on file  . Intimate partner violence:    Fear of current or ex partner: Not on file    Emotionally abused: Not on file    Physically abused: Not on file    Forced sexual activity: Not on file  Other Topics Concern  . Not on file  Social History Narrative   Lives with husband.  5 children.  Education BA.     PHYSICAL EXAM  GENERAL EXAM/CONSTITUTIONAL: Vitals:  Vitals:   09/11/18 0822  BP: (!) 170/89  Pulse: 76  Weight: 207 lb 9.6 oz (94.2 kg)  Height: 5\' 2"  (1.575 m)     Body mass index is 37.97 kg/m. Wt Readings from Last 3 Encounters:  09/11/18 207 lb 9.6 oz (94.2 kg)  08/10/18 209 lb 7 oz (95 kg)  08/08/18 210 lb 4 oz (95.4 kg)     Patient is in no distress; well developed, nourished and groomed; neck is supple  CARDIOVASCULAR:  Examination of carotid arteries is normal; no carotid bruits  Regular rate and rhythm, no murmurs  Examination of peripheral vascular system by observation and palpation is  normal  EYES:  Ophthalmoscopic exam of optic discs and posterior segments is normal; no papilledema or hemorrhages  Visual Acuity Screening   Right eye Left eye Both eyes  Without correction:     With correction: 20/20 20/30      MUSCULOSKELETAL:  Gait, strength, tone, movements noted in Neurologic exam below  NEUROLOGIC: MENTAL STATUS:  MMSE - Mini Mental State Exam 09/20/2017  Orientation to time 5  Orientation to Place 5  Registration 3  Attention/ Calculation 5  Recall 2  Language- name  2 objects 2  Language- repeat 1  Language- follow 3 step command 3  Language- read & follow direction 1  Write a sentence 1  Copy design 1  Total score 29    awake, alert, oriented to person, place and time  recent and remote memory intact  normal attention and concentration  language fluent, comprehension intact, naming intact  fund of knowledge appropriate  CRANIAL NERVE:   2nd - no papilledema on fundoscopic exam  2nd, 3rd, 4th, 6th - pupils equal and reactive to light, visual fields full to confrontation, extraocular muscles intact, no nystagmus  5th - facial sensation symmetric  7th - facial strength symmetric  8th - hearing intact  9th - palate elevates symmetrically, uvula midline  11th - shoulder shrug symmetric  12th - tongue protrusion midline  MOTOR:   normal bulk and tone, full strength in the BUE, BLE  SENSORY:   normal and symmetric to light touch, temperature, vibration  COORDINATION:   finger-nose-finger, fine finger movements normal  REFLEXES:   deep tendon reflexes present and symmetric  GAIT/STATION:   narrow based gait     DIAGNOSTIC DATA (LABS, IMAGING, TESTING) - I reviewed patient records, labs, notes, testing and imaging myself where available.  Lab Results  Component Value Date   WBC 7.2 08/10/2018   HGB 13.5 08/10/2018   HCT 41.7 08/10/2018   MCV 88.7 08/10/2018   PLT 309 08/10/2018      Component Value  Date/Time   NA 142 08/10/2018 1330   K 4.0 08/10/2018 1330   CL 105 08/10/2018 1330   CO2 27 08/10/2018 1330   GLUCOSE 100 (H) 08/10/2018 1330   BUN 15 08/10/2018 1330   CREATININE 0.54 08/10/2018 1330   CREATININE 0.56 (L) 12/10/2015 1537   CALCIUM 9.7 08/10/2018 1330   PROT 8.0 08/10/2018 1330   ALBUMIN 4.5 08/10/2018 1330   AST 23 08/10/2018 1330   ALT 21 08/10/2018 1330   ALKPHOS 95 08/10/2018 1330   BILITOT 0.7 08/10/2018 1330   GFRNONAA >60 08/10/2018 1330   GFRAA >60 08/10/2018 1330   Lab Results  Component Value Date   CHOL 197 08/08/2018   HDL 72.40 08/08/2018   LDLCALC 112 (H) 08/08/2018   LDLDIRECT 125.0 09/09/2012   TRIG 64.0 08/08/2018   CHOLHDL 3 08/08/2018   Lab Results  Component Value Date   HGBA1C 5.6 09/21/2017   No results found for: VITAMINB12 Lab Results  Component Value Date   TSH 2.37 08/08/2018    08/09/18 CT head [I reviewed images myself and agree with interpretation. -VRP]  - no acute findings    ASSESSMENT AND PLAN  84 y.o. year old female here with:  Dx:  1. TIA due to embolism ([place])   2. Essential hypertension   3. Primary stabbing headache      PLAN:  TIA (left face / arm numbness; left arm weakness x 1 hour; 08/10/18) - check MRI brain, carotid u/s, echocardiogram - continue aspirin 81mg  daily - consider statin to lower LDL to goal < 70 for stroke prevention; although given age and patient reluctance for new medications, may hold off for now - improve BP control (170/89 today; ran out of BP meds 2 days ago) - check A1c  MIGRAINE WITH AURA (? New since 2016) - check MRI brain as above - consider beta-blocker for migraine prevention  PRIMARY STABBING HEADACHE - consider indomethacin or melatonin  UNCONTROLLED HYPERTENSION - consider beta-blocker (may also help reduce HA)  Orders  Placed This Encounter  Procedures  . MR BRAIN WO CONTRAST  . Hemoglobin A1c  . ECHOCARDIOGRAM COMPLETE   Return in about 4 months  (around 01/10/2019).  I reviewed images, labs, notes, records myself. I summarized findings and reviewed with patient, for this high risk condition (TIA) requiring high complexity decision making.    Penni Bombard, MD 69/04/7892, 8:10 AM Certified in Neurology, Neurophysiology and Neuroimaging  [facility] [address] [place], [place] [address] [phone]

## 2018-09-11 NOTE — Patient Instructions (Signed)
  TIA (left face / arm numbness; left arm weakness x 1 hour; 08/10/18) - check MRI brain, carotid u/s, echocardiogram - continue aspirin 81mg  daily - improve BP control (170/89 today; ran out of BP meds 2 days ago)  MIGRAINE WITH AURA (? New since 2016) - check MRI brain as above - consider beta-blocker for migraine prevention  PRIMARY STABBING HEADACHE - consider indomethacin or melatonin  UNCONTROLLED HYPERTENSION - consider beta-blocker (may also help reduce HA)

## 2018-09-11 NOTE — Telephone Encounter (Signed)
[employer] medicare order sent to [facility]. They will obtain the auth and will reach out to the pt to schedule.  °

## 2018-09-12 ENCOUNTER — Encounter: Payer: Self-pay | Admitting: *Deleted

## 2018-09-12 LAB — HEMOGLOBIN A1C
ESTIMATED AVERAGE GLUCOSE: 114 mg/dL
HEMOGLOBIN A1C: 5.6 % (ref 4.8–5.6)

## 2018-09-13 NOTE — Telephone Encounter (Signed)
-----   Message from Penni Bombard, MD sent at 09/12/2018  5:17 PM EST ----- Normal labs. Please call patient. -VRP

## 2018-09-16 ENCOUNTER — Ambulatory Visit (HOSPITAL_COMMUNITY): Payer: Medicare HMO | Attending: Cardiovascular Disease

## 2018-09-16 ENCOUNTER — Other Ambulatory Visit: Payer: Self-pay

## 2018-09-16 DIAGNOSIS — I749 Embolism and thrombosis of unspecified artery: Secondary | ICD-10-CM | POA: Diagnosis not present

## 2018-09-16 DIAGNOSIS — G459 Transient cerebral ischemic attack, unspecified: Secondary | ICD-10-CM

## 2018-09-16 DIAGNOSIS — G4485 Primary stabbing headache: Secondary | ICD-10-CM | POA: Diagnosis not present

## 2018-09-16 DIAGNOSIS — I10 Essential (primary) hypertension: Secondary | ICD-10-CM

## 2018-09-18 ENCOUNTER — Encounter (HOSPITAL_COMMUNITY): Payer: Medicare HMO

## 2018-09-18 NOTE — Telephone Encounter (Signed)
[employer] medicare Josem Kaufmann: [id number] (exp. 09/17/18 to 12/16/18) pt is scheduled at [facility] for 09/20/18.

## 2018-09-19 ENCOUNTER — Ambulatory Visit (HOSPITAL_COMMUNITY)
Admission: RE | Admit: 2018-09-19 | Discharge: 2018-09-19 | Disposition: A | Discharge status: Home / Self Care | Payer: Medicare HMO | Source: Ambulatory Visit | Attending: Diagnostic Neuroimaging | Admitting: Diagnostic Neuroimaging

## 2018-09-19 DIAGNOSIS — G459 Transient cerebral ischemic attack, unspecified: Secondary | ICD-10-CM | POA: Diagnosis not present

## 2018-09-19 DIAGNOSIS — I749 Embolism and thrombosis of unspecified artery: Secondary | ICD-10-CM | POA: Insufficient documentation

## 2018-09-19 DIAGNOSIS — G4485 Primary stabbing headache: Secondary | ICD-10-CM | POA: Diagnosis not present

## 2018-09-19 DIAGNOSIS — I10 Essential (primary) hypertension: Secondary | ICD-10-CM | POA: Insufficient documentation

## 2018-09-20 ENCOUNTER — Ambulatory Visit
Admission: RE | Admit: 2018-09-20 | Discharge: 2018-09-20 | Disposition: A | Discharge status: Home / Self Care | Payer: Medicare HMO | Source: Ambulatory Visit | Attending: Diagnostic Neuroimaging | Admitting: Diagnostic Neuroimaging

## 2018-09-20 DIAGNOSIS — G4485 Primary stabbing headache: Secondary | ICD-10-CM

## 2018-09-20 DIAGNOSIS — I749 Embolism and thrombosis of unspecified artery: Principal | ICD-10-CM

## 2018-09-20 DIAGNOSIS — G459 Transient cerebral ischemic attack, unspecified: Secondary | ICD-10-CM

## 2018-09-20 DIAGNOSIS — I10 Essential (primary) hypertension: Secondary | ICD-10-CM

## 2018-09-25 ENCOUNTER — Telehealth: Payer: Self-pay | Admitting: *Deleted

## 2018-09-25 NOTE — Telephone Encounter (Signed)
LMVM for pt to return call at her convenience.  °

## 2018-09-25 NOTE — Telephone Encounter (Signed)
-----   Message from Penni Bombard, MD sent at 09/24/2018  5:16 PM EST ----- Unremarkable study. No major findings. Please call patient. Continue current plan. -VRP

## 2018-09-26 NOTE — Telephone Encounter (Signed)
Spoke to pt and relayed that her US carotid study was unremarkable, no major findings as well as her echocardiogram.  Her MRI brain results showed no acute findings.  She is walking every day, going to weight wat[employer].  I went over stroke sx (acute weakness, numbness, tingling, dizziness, off balance, vision loss (one sided sx) and if any of these to call 911, go to ED.    She verbalized understanding.

## 2018-09-26 NOTE — Telephone Encounter (Signed)
-----   Message from Penni Bombard, MD sent at 09/24/2018  5:16 PM EST ----- Unremarkable study. No major findings. Please call patient. Continue current plan. -VRP

## 2018-11-27 ENCOUNTER — Other Ambulatory Visit: Payer: Self-pay | Admitting: Family Medicine

## 2018-12-03 ENCOUNTER — Encounter: Payer: Self-pay | Admitting: Diagnostic Neuroimaging

## 2018-12-05 ENCOUNTER — Telehealth: Payer: Self-pay | Admitting: General Practice

## 2018-12-05 DIAGNOSIS — R69 Illness, unspecified: Secondary | ICD-10-CM | POA: Diagnosis not present

## 2018-12-05 NOTE — Telephone Encounter (Signed)
Spoke with pt, she stated that she was in no rush to schedule an appt with neurology and was persistent on cancelling her appt with Dr. Birdie Riddle tomorrow. I urged pt to contact her neurologist this afternoon if she refused to keep her appt with [place] tomorrow.

## 2018-12-05 NOTE — Telephone Encounter (Signed)
Pt did not remember who her neurologist was.  I gave her that information and phone number for the dr. Pt states she will call them and get an appt. I offered to keep this appt for the pt tomorrow, she said no, I am sure they can see me. I am hesitant to cancel if pt cannot see Dr Leta Baptist for several days.  Pt states she will call back if she needs to reschedule.  Please advise

## 2018-12-05 NOTE — Telephone Encounter (Signed)
Called pt to find out if she had contacted her neurology office about her migraines. There was no answer.   LMOVM to inform that per PCP she would prefer if pt contacted her neurology office to see if they could see her. Pt was left on the schedule as I could not make contact.  If pt cannot be seen by Neuro ok to keep on PCP schedule. But she needs to inform them of her migraines.   [place] for [facility] to Discuss results / PCP recommendations / Schedule patient.

## 2018-12-06 ENCOUNTER — Other Ambulatory Visit: Payer: Self-pay | Admitting: Family Medicine

## 2018-12-06 ENCOUNTER — Ambulatory Visit: Payer: Medicare HMO | Admitting: Family Medicine

## 2018-12-12 DIAGNOSIS — Z853 Personal history of malignant neoplasm of breast: Secondary | ICD-10-CM | POA: Diagnosis not present

## 2018-12-12 DIAGNOSIS — Z1231 Encounter for screening mammogram for malignant neoplasm of breast: Secondary | ICD-10-CM | POA: Diagnosis not present

## 2018-12-12 LAB — HM MAMMOGRAPHY

## 2018-12-16 ENCOUNTER — Encounter: Payer: Self-pay | Admitting: Emergency Medicine

## 2019-01-07 ENCOUNTER — Ambulatory Visit: Payer: Medicare HMO | Admitting: Diagnostic Neuroimaging

## 2019-01-07 ENCOUNTER — Encounter: Payer: Self-pay | Admitting: Diagnostic Neuroimaging

## 2019-01-07 VITALS — BP 153/80 | HR 74 | Ht 62.0 in | Wt 211.0 lb

## 2019-01-07 DIAGNOSIS — G459 Transient cerebral ischemic attack, unspecified: Secondary | ICD-10-CM

## 2019-01-07 DIAGNOSIS — G43109 Migraine with aura, not intractable, without status migrainosus: Secondary | ICD-10-CM

## 2019-01-07 DIAGNOSIS — I749 Embolism and thrombosis of unspecified artery: Secondary | ICD-10-CM | POA: Diagnosis not present

## 2019-01-07 DIAGNOSIS — I10 Essential (primary) hypertension: Secondary | ICD-10-CM | POA: Diagnosis not present

## 2019-01-07 NOTE — Progress Notes (Signed)
[facility]  PATIENT: Ann Conley DOB: 02/04/1934  REFERRING CLINICIAN: Alver Fisher HISTORY FROM: patient  REASON FOR VISIT: follow up   HISTORICAL  CHIEF COMPLAINT:  Chief Complaint  Patient presents with  . Transient Ischemic Attack    rm 6, "occas occular migraines w/light sensitivity- take Excedrin Migraine and lay down for relief, Ibuprofen for less severe migraines"  . Follow-up    4 month    HISTORY OF PRESENT ILLNESS:   UPDATE (01/07/19, VRP): Since last visit, doing well. Symptoms are resolved. Severity is mild. No alleviating or aggravating factors. Tolerating meds. Having 1-2 HA per month, and controlled with Excedrin migraine.  PRIOR HPI (09/14/18): 85 year old female here for evaluation:  STABBING HEADACHES Patient has had long history since childhood of intermittent sharp stabbing headaches lasting for a few seconds, on the right side of her head.  These can occur several times per day and several times per month.  Patient looked up online and diagnosed herself with ice pick headaches.  She does not take any medications for these symptoms because they are so short lasting.  They have slightly worsened in the last few months.  Patient has been under more stress with her husband's illness and other family issues.  MIGRAINE TYPE HA Since 2016 patient has had a different type of headache which consists of partial vision loss in the left eye, followed by severe global pounding throbbing headaches associated with photophobia.  No nausea or vomiting.  Headaches can last 2 hours or longer at a time.  She diagnosed herself with "ocular migraines".  She is taken over-the-counter medications with mild relief.  These headaches have been worse than last 6 to 12 months, but have spontaneously resolved since August 2019.  LEFT ARM NUMBNESS 08/10/2018 patient noticed sudden onset of left arm numbness and weakness.  She was having trouble controlling her left arm.   She also noticed some numbness in her left face.  Symptoms lasted for 1 hour.  Patient went to the emergency room for evaluation.  CT of the head and lab testing were unremarkable.  Blood pressure was 175/85.  Apparently "low concern for TIA" was determined and patient was encouraged to follow-up with PCP for evaluation. On 08/23/2018 patient had recurrence of symptoms, with left arm and left face numbness and tingling.  Patient called PCP who recommended ER evaluation but patient declined to go.  HYPERTENSION Patient ran out of her blood pressure medications 2 days ago.  She says that her blood pressure is "always high".  She is on amlodipine 10 mg daily.  Today blood pressure is 170/89.   REVIEW OF SYSTEMS: Full 14 system review of systems performed and negative with exception of: as per HPI.   ALLERGIES: Allergies  Allergen Reactions  . Codeine Nausea And Vomiting    HOME MEDICATIONS: Outpatient Medications Prior to Visit  Medication Sig Dispense Refill  . amLODipine (NORVASC) 10 MG tablet TAKE ONE TABLET BY MOUTH DAILY 90 tablet 0  . aspirin 81 MG tablet Take 81 mg by mouth every evening.     Marland Kitchen aspirin-acetaminophen-caffeine (EXCEDRIN MIGRAINE) 250-250-65 MG tablet Take by mouth every 6 (six) hours as needed for headache.    . ibuprofen (ADVIL,MOTRIN) 200 MG tablet Take 200 mg by mouth every 6 (six) hours as needed.    . Multiple Vitamins-Minerals (MULTIVITAMIN ADULT PO) Take 1 tablet by mouth daily.     . multivitamin-lutein (OCUVITE-LUTEIN) CAPS capsule Take 1 capsule by mouth daily.    Marland Kitchen  omeprazole (PRILOSEC) 20 MG capsule TAKE ONE CAPSULE BY MOUTH DAILY 90 capsule 0  . vitamin B-12 (CYANOCOBALAMIN) 100 MCG tablet Take 100 mcg by mouth daily.     No facility-administered medications prior to visit.     PAST MEDICAL HISTORY: Past Medical History:  Diagnosis Date  . Anemia   . Blood transfusion complicating pregnancy 1017  . GERD (gastroesophageal reflux disease)   . History  of chicken pox   . Hypertension   . Migraine    occular  . TIA (transient ischemic attack)     PAST SURGICAL HISTORY: Past Surgical History:  Procedure Laterality Date  . BREAST BIOPSY    . CHOLECYSTECTOMY    . PARTIAL HYSTERECTOMY      FAMILY HISTORY: Family History  Problem Relation Age of Onset  . Breast cancer Mother   . Heart disease Father   . Heart disease Sister        pacemaker  . Diabetes Sister     SOCIAL HISTORY: Social History   Socioeconomic History  . Marital status: Married    Spouse name: Not on file  . Number of children: Not on file  . Years of education: Not on file  . Highest education level: Not on file  Occupational History  . Not on file  Social Needs  . Financial resource strain: Not on file  . Food insecurity:    Worry: Not on file    Inability: Not on file  . Transportation needs:    Medical: Not on file    Non-medical: Not on file  Tobacco Use  . Smoking status: Never Smoker  . Smokeless tobacco: Never Used  Substance and Sexual Activity  . Alcohol use: Yes    Alcohol/week: 0.0 standard drinks    Comment: wine occ.  . Drug use: No  . Sexual activity: Not on file  Lifestyle  . Physical activity:    Days per week: Not on file    Minutes per session: Not on file  . Stress: Not on file  Relationships  . Social connections:    Talks on phone: Not on file    Gets together: Not on file    Attends religious service: Not on file    Active member of club or organization: Not on file    Attends meetings of clubs or organizations: Not on file    Relationship status: Not on file  . Intimate partner violence:    Fear of current or ex partner: Not on file    Emotionally abused: Not on file    Physically abused: Not on file    Forced sexual activity: Not on file  Other Topics Concern  . Not on file  Social History Narrative   Lives with husband.  5 children.  Education BA.     PHYSICAL EXAM  GENERAL  EXAM/CONSTITUTIONAL: Vitals:  Vitals:   01/07/19 1053  BP: (!) 153/80  Pulse: 74  Weight: 211 lb (95.7 kg)  Height: 5\' 2"  (1.575 m)   Body mass index is 38.59 kg/m. Wt Readings from Last 3 Encounters:  01/07/19 211 lb (95.7 kg)  09/11/18 207 lb 9.6 oz (94.2 kg)  08/10/18 209 lb 7 oz (95 kg)    Patient is in no distress; well developed, nourished and groomed; neck is supple  CARDIOVASCULAR:  Examination of carotid arteries is normal; no carotid bruits  Regular rate and rhythm, no murmurs  Examination of peripheral vascular system by observation and palpation is normal  EYES:  Ophthalmoscopic exam of optic discs and posterior segments is normal; no papilledema or hemorrhages No exam data present  MUSCULOSKELETAL:  Gait, strength, tone, movements noted in Neurologic exam below  NEUROLOGIC: MENTAL STATUS:  MMSE - Mini Mental State Exam 09/20/2017  Orientation to time 5  Orientation to Place 5  Registration 3  Attention/ Calculation 5  Recall 2  Language- name 2 objects 2  Language- repeat 1  Language- follow 3 step command 3  Language- read & follow direction 1  Write a sentence 1  Copy design 1  Total score 29    awake, alert, oriented to person, place and time  recent and remote memory intact  normal attention and concentration  language fluent, comprehension intact, naming intact  fund of knowledge appropriate  CRANIAL NERVE:   2nd - no papilledema on fundoscopic exam  2nd, 3rd, 4th, 6th - pupils equal and reactive to light, visual fields full to confrontation, extraocular muscles intact, no nystagmus  5th - facial sensation symmetric  7th - facial strength symmetric  8th - hearing intact  9th - palate elevates symmetrically, uvula midline  11th - shoulder shrug symmetric  12th - tongue protrusion midline  MOTOR:   normal bulk and tone, full strength in the BUE, BLE  SENSORY:   normal and symmetric to light touch, temperature,  vibration  COORDINATION:   finger-nose-finger, fine finger movements normal  REFLEXES:   deep tendon reflexes present and symmetric  GAIT/STATION:   narrow based gait     DIAGNOSTIC DATA (LABS, IMAGING, TESTING) - I reviewed patient records, labs, notes, testing and imaging myself where available.  Lab Results  Component Value Date   WBC 7.2 08/10/2018   HGB 13.5 08/10/2018   HCT 41.7 08/10/2018   MCV 88.7 08/10/2018   PLT 309 08/10/2018      Component Value Date/Time   NA 142 08/10/2018 1330   K 4.0 08/10/2018 1330   CL 105 08/10/2018 1330   CO2 27 08/10/2018 1330   GLUCOSE 100 (H) 08/10/2018 1330   BUN 15 08/10/2018 1330   CREATININE 0.54 08/10/2018 1330   CREATININE 0.56 (L) 12/10/2015 1537   CALCIUM 9.7 08/10/2018 1330   PROT 8.0 08/10/2018 1330   ALBUMIN 4.5 08/10/2018 1330   AST 23 08/10/2018 1330   ALT 21 08/10/2018 1330   ALKPHOS 95 08/10/2018 1330   BILITOT 0.7 08/10/2018 1330   GFRNONAA >60 08/10/2018 1330   GFRAA >60 08/10/2018 1330   Lab Results  Component Value Date   CHOL 197 08/08/2018   HDL 72.40 08/08/2018   LDLCALC 112 (H) 08/08/2018   LDLDIRECT 125.0 09/09/2012   TRIG 64.0 08/08/2018   CHOLHDL 3 08/08/2018   Lab Results  Component Value Date   HGBA1C 5.6 09/11/2018   No results found for: [id number] Lab Results  Component Value Date   TSH 2.37 08/08/2018    08/09/18 CT head [I reviewed images myself and agree with interpretation. -VRP]  - no acute findings  09/20/18 MRI of the brain without contrast shows the following: 1.   Moderate chronic microvascular ischemic changes.  None of the foci appears to be acute. 2.   Age-appropriate minimal to mild generalized cortical atrophy. 3.   There are no acute findings.  09/16/18 TTE - Left ventricle: The cavity size was normal. Wall thickness was   increased in a pattern of mild LVH. Systolic function was normal.   The estimated ejection fraction was in the range of 60%  to 65%.    Doppler parameters are consistent with both elevated ventricular   end-diastolic filling pressure and elevated left atrial filling   pressure. - Mitral valve: Severely calcified annulus. Moderately thickened,   mildly calcified leaflets . Valve area by continuity equation   (using LVOT flow): 3.61 cm^2. - Left atrium: The atrium was moderately dilated. - Atrial septum: No defect or patent foramen ovale was identified.  09/19/18 carotid u/s Right Carotid: Velocities in the right ICA are consistent with a 1-39% stenosis. Left Carotid: Velocities in the left ICA are consistent with a 1-39% stenosis. Vertebrals:  Bilateral vertebral arteries demonstrate antegrade flow. Subclavians: Normal flow hemodynamics were seen in bilateral subclavian arteries.    ASSESSMENT AND PLAN  85 y.o. year old female here with:  Dx:  1. TIA due to embolism ([place])   2. Essential hypertension   3. Migraine with aura and without status migrainosus, not intractable     PLAN:  TIA (left face / arm numbness; left arm weakness x 1 hour; 08/10/18) - continue aspirin 81mg  daily - consider statin to lower LDL to goal < 70 for stroke prevention; although given age and patient reluctance for new medications, may hold off for now - continue BP control   MIGRAINE WITH AURA (? New since 2016) - consider beta-blocker for migraine prevention (patient does not want addl meds) - use ibuprofen, tylenol or excedrin migraine as needed  PRIMARY STABBING HEADACHE - consider indomethacin or melatonin (patient does not want addl meds)  UNCONTROLLED HYPERTENSION - consider beta-blocker (may also help reduce HA) - (patient does not want addl meds)  Return for pending if symptoms worsen or fail to improve, return to PCP.     Penni Bombard, MD 07/11/3275, 14:70 AM Certified in Neurology, Neurophysiology and Neuroimaging  [facility] [address] [place], [place] [address] [phone]

## 2019-01-27 ENCOUNTER — Ambulatory Visit: Payer: Medicare HMO | Admitting: Diagnostic Neuroimaging

## 2019-03-07 ENCOUNTER — Other Ambulatory Visit: Payer: Self-pay | Admitting: Family Medicine

## 2019-03-12 ENCOUNTER — Ambulatory Visit (INDEPENDENT_AMBULATORY_CARE_PROVIDER_SITE_OTHER): Payer: Medicare HMO | Admitting: Family Medicine

## 2019-03-12 ENCOUNTER — Encounter: Payer: Self-pay | Admitting: Family Medicine

## 2019-03-12 VITALS — BP 140/76 | Ht 60.0 in | Wt 206.0 lb

## 2019-03-12 DIAGNOSIS — I10 Essential (primary) hypertension: Secondary | ICD-10-CM

## 2019-03-12 NOTE — Progress Notes (Signed)
I have discussed the procedure for the virtual visit with the patient who has given consent to proceed with assessment and treatment.   Mabelle Mungin L Derrell Milanes, CMA     

## 2019-03-12 NOTE — Progress Notes (Signed)
Virtual Visit via Video   I connected with patient on 03/12/19 at  1:00 PM EDT by a video enabled telemedicine application and verified that I am speaking with the correct person using two identifiers.  Location patient: Home Location provider: Acupuncturist, Office Persons participating in the virtual visit: Patient, Provider, [place] (Jess B)  I discussed the limitations of evaluation and management by telemedicine and the availability of in person appointments. The patient expressed understanding and agreed to proceed.  Subjective:   HPI:   HTN- chronic problem, on Amlodipine 10mg  daily w/ adequate control.  No CP, SOB, HAs, visual changes, abd pain, N/V, edema.  Obesity- pt's BMI is now 40.23  She is down 4 lbs since last visit.  Pt has been very active w/ home projects recently.  ROS:   See pertinent positives and negatives per HPI.  Patient Active Problem List   Diagnosis Date Noted  . 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 Primary osteoarthritis of both knees 06/01/2017  . 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 Bleeding hemorrhoid 04/13/2016  . Complicated migraine 66/44/0347  . Keratoma 10/19/2014  . Pain in lower limb 10/19/2014  . Hammer toe of left foot 10/19/2014  . Obesity (BMI 30-39.9) 09/17/2014  . Snoring 03/13/2013  . Left anterior knee pain 03/13/2013  . Cough 09/18/2011  . Mole (skin) 09/18/2011  . Migraine with aura 05/07/2011  . HTN (hypertension) 04/25/2011  . GERD (gastroesophageal reflux disease) 04/25/2011  . Annual physical exam 04/25/2011  . BREAST CANCER, HX OF 06/21/2007    Social History   Tobacco Use  . Smoking status: Never Smoker  . Smokeless tobacco: Never Used  Substance Use Topics  . Alcohol use: Yes    Alcohol/week: 0.0 standard drinks    Comment: wine occ.    Current Outpatient Medications:  .  amLODipine (NORVASC) 10 MG tablet, TAKE ONE TABLET BY MOUTH DAILY, Disp: 30 tablet, Rfl: 0 .  Ascorbic Acid (VITAMIN C PO),  Take by mouth., Disp: , Rfl:  .  aspirin 81 MG tablet, Take 81 mg by mouth every evening. , Disp: , Rfl:  .  aspirin-acetaminophen-caffeine (EXCEDRIN MIGRAINE) 250-250-65 MG tablet, Take by mouth every 6 (six) hours as needed for headache., Disp: , Rfl:  .  ibuprofen (ADVIL,MOTRIN) 200 MG tablet, Take 200 mg by mouth every 6 (six) hours as needed., Disp: , Rfl:  .  Multiple Vitamins-Minerals (MULTIVITAMIN ADULT PO), Take 1 tablet by mouth daily. , Disp: , Rfl:  .  multivitamin-lutein (OCUVITE-LUTEIN) CAPS capsule, Take 1 capsule by mouth daily., Disp: , Rfl:  .  omeprazole (PRILOSEC) 20 MG capsule, TAKE ONE CAPSULE BY MOUTH DAILY, Disp: 90 capsule, Rfl: 0 .  vitamin B-12 (CYANOCOBALAMIN) 100 MCG tablet, Take 100 mcg by mouth daily., Disp: , Rfl:   Allergies  Allergen Reactions  . Codeine Nausea And Vomiting    Objective:   BP 140/76   Ht 5' (1.524 m)   Wt 206 lb (93.4 kg)   BMI 40.23 kg/m   AAOx3, NAD NCAT, EOMI No obvious CN deficits Coloring WNL Pt is able to speak clearly, coherently without shortness of breath or increased work of breathing.  Thought process is linear.  Mood is appropriate.  Assessment and Plan:   HTN- chronic problem.  Adequate control.  Asymptomatic.  No med changes at this time.  Obesity- ongoing issue for pt.  Stressed need for healthy diet and regular exercise.  Will check labs to risk stratify.  Annye Asa,  MD 03/12/2019

## 2019-03-17 ENCOUNTER — Other Ambulatory Visit (INDEPENDENT_AMBULATORY_CARE_PROVIDER_SITE_OTHER): Payer: Medicare HMO

## 2019-03-17 DIAGNOSIS — I10 Essential (primary) hypertension: Secondary | ICD-10-CM

## 2019-03-17 LAB — HEPATIC FUNCTION PANEL
ALT: 13 U/L (ref 0–35)
AST: 15 U/L (ref 0–37)
Albumin: 4.3 g/dL (ref 3.5–5.2)
Alkaline Phosphatase: 96 U/L (ref 39–117)
Bilirubin, Direct: 0.1 mg/dL (ref 0.0–0.3)
Total Bilirubin: 0.6 mg/dL (ref 0.2–1.2)
Total Protein: 7 g/dL (ref 6.0–8.3)

## 2019-03-17 LAB — CBC WITH DIFFERENTIAL/PLATELET
Basophils Absolute: 0.2 10*3/uL — ABNORMAL HIGH (ref 0.0–0.1)
Basophils Relative: 1.3 % (ref 0.0–3.0)
Eosinophils Absolute: 0.1 10*3/uL (ref 0.0–0.7)
Eosinophils Relative: 0.8 % (ref 0.0–5.0)
HCT: 38.4 % (ref 36.0–46.0)
Hemoglobin: 12.8 g/dL (ref 12.0–15.0)
Lymphocytes Relative: 13.4 % (ref 12.0–46.0)
Lymphs Abs: 1.6 10*3/uL (ref 0.7–4.0)
MCHC: 33.3 g/dL (ref 30.0–36.0)
MCV: 86.8 fl (ref 78.0–100.0)
Monocytes Absolute: 0.7 10*3/uL (ref 0.1–1.0)
Monocytes Relative: 6 % (ref 3.0–12.0)
Neutro Abs: 9.2 10*3/uL — ABNORMAL HIGH (ref 1.4–7.7)
Neutrophils Relative %: 78.5 % — ABNORMAL HIGH (ref 43.0–77.0)
Platelets: 277 10*3/uL (ref 150.0–400.0)
RBC: 4.43 Mil/uL (ref 3.87–5.11)
RDW: 14.5 % (ref 11.5–15.5)
WBC: 11.7 10*3/uL — ABNORMAL HIGH (ref 4.0–10.5)

## 2019-03-17 LAB — LIPID PANEL
Cholesterol: 197 mg/dL (ref 0–200)
HDL: 77.3 mg/dL (ref >39.00)
LDL Cholesterol: 104 mg/dL — ABNORMAL HIGH (ref 0–99)
NonHDL: 119.32
Total CHOL/HDL Ratio: 3
Triglycerides: 76 mg/dL (ref 0.0–149.0)
VLDL: 15.2 mg/dL (ref 0.0–40.0)

## 2019-03-17 LAB — TSH: TSH: 2.51 u[IU]/mL (ref 0.35–4.50)

## 2019-03-17 LAB — BASIC METABOLIC PANEL
BUN: 13 mg/dL (ref 6–23)
CO2: 26 mEq/L (ref 19–32)
Calcium: 9.1 mg/dL (ref 8.4–10.5)
Chloride: 97 mEq/L (ref 96–112)
Creatinine, Ser: 0.7 mg/dL (ref 0.40–1.20)
GFR: 80.03 mL/min (ref >60.00)
Glucose, Bld: 122 mg/dL — ABNORMAL HIGH (ref 70–99)
Potassium: 4.1 mEq/L (ref 3.5–5.1)
Sodium: 136 mEq/L (ref 135–145)

## 2019-03-19 ENCOUNTER — Other Ambulatory Visit: Payer: Self-pay | Admitting: Family Medicine

## 2019-03-19 DIAGNOSIS — D72829 Elevated white blood cell count, unspecified: Secondary | ICD-10-CM

## 2019-04-09 ENCOUNTER — Other Ambulatory Visit: Payer: Self-pay | Admitting: Family Medicine

## 2019-04-09 DIAGNOSIS — Z833 Family history of diabetes mellitus: Secondary | ICD-10-CM | POA: Diagnosis not present

## 2019-04-09 DIAGNOSIS — G43B0 Ophthalmoplegic migraine, not intractable: Secondary | ICD-10-CM | POA: Diagnosis not present

## 2019-04-09 DIAGNOSIS — Z7982 Long term (current) use of aspirin: Secondary | ICD-10-CM | POA: Diagnosis not present

## 2019-04-09 DIAGNOSIS — Z8249 Family history of ischemic heart disease and other diseases of the circulatory system: Secondary | ICD-10-CM | POA: Diagnosis not present

## 2019-04-09 DIAGNOSIS — G43709 Chronic migraine without aura, not intractable, without status migrainosus: Secondary | ICD-10-CM | POA: Diagnosis not present

## 2019-04-09 DIAGNOSIS — I10 Essential (primary) hypertension: Secondary | ICD-10-CM | POA: Diagnosis not present

## 2019-04-09 DIAGNOSIS — Z809 Family history of malignant neoplasm, unspecified: Secondary | ICD-10-CM | POA: Diagnosis not present

## 2019-04-21 ENCOUNTER — Other Ambulatory Visit (INDEPENDENT_AMBULATORY_CARE_PROVIDER_SITE_OTHER): Payer: Medicare HMO

## 2019-04-21 ENCOUNTER — Other Ambulatory Visit: Payer: Self-pay

## 2019-04-21 DIAGNOSIS — D72829 Elevated white blood cell count, unspecified: Secondary | ICD-10-CM | POA: Diagnosis not present

## 2019-04-21 LAB — CBC WITH DIFFERENTIAL/PLATELET
Basophils Absolute: 0.1 10*3/uL (ref 0.0–0.1)
Basophils Relative: 1.1 % (ref 0.0–3.0)
Eosinophils Absolute: 0.2 10*3/uL (ref 0.0–0.7)
Eosinophils Relative: 2.5 % (ref 0.0–5.0)
HCT: 39.5 % (ref 36.0–46.0)
Hemoglobin: 13 g/dL (ref 12.0–15.0)
Lymphocytes Relative: 27 % (ref 12.0–46.0)
Lymphs Abs: 1.9 10*3/uL (ref 0.7–4.0)
MCHC: 32.8 g/dL (ref 30.0–36.0)
MCV: 87.5 fl (ref 78.0–100.0)
Monocytes Absolute: 0.5 10*3/uL (ref 0.1–1.0)
Monocytes Relative: 7 % (ref 3.0–12.0)
Neutro Abs: 4.4 10*3/uL (ref 1.4–7.7)
Neutrophils Relative %: 62.4 % (ref 43.0–77.0)
Platelets: 284 10*3/uL (ref 150.0–400.0)
RBC: 4.52 Mil/uL (ref 3.87–5.11)
RDW: 15.2 % (ref 11.5–15.5)
WBC: 7.1 10*3/uL (ref 4.0–10.5)

## 2019-04-29 ENCOUNTER — Telehealth: Payer: Self-pay | Admitting: *Deleted

## 2019-04-29 NOTE — Telephone Encounter (Signed)
Pt called in wanting to know what she needed to do about her migraine headaches that she is still having.   I saw that patient saw Neurology in march for these issues. I asked if she had contacted them and she said no. Pt stated that she was going to call them for an appointment.  She states that she was not initially impressed with that office but that she would try it again. She said if she ends up wanting a referral to someone else she will call us back.  Routing to PCP as Juluis Rainier

## 2019-05-02 ENCOUNTER — Other Ambulatory Visit: Payer: Self-pay

## 2019-05-02 ENCOUNTER — Ambulatory Visit: Payer: Self-pay | Admitting: Family Medicine

## 2019-05-02 ENCOUNTER — Encounter: Payer: Self-pay | Admitting: Family Medicine

## 2019-05-02 ENCOUNTER — Ambulatory Visit (INDEPENDENT_AMBULATORY_CARE_PROVIDER_SITE_OTHER): Payer: Medicare HMO | Admitting: Family Medicine

## 2019-05-02 VITALS — BP 143/64 | HR 73 | Temp 97.9°F | Resp 16 | Wt 211.4 lb

## 2019-05-02 DIAGNOSIS — L989 Disorder of the skin and subcutaneous tissue, unspecified: Secondary | ICD-10-CM

## 2019-05-02 DIAGNOSIS — R079 Chest pain, unspecified: Secondary | ICD-10-CM | POA: Diagnosis not present

## 2019-05-02 DIAGNOSIS — R200 Anesthesia of skin: Secondary | ICD-10-CM | POA: Diagnosis not present

## 2019-05-02 DIAGNOSIS — G43109 Migraine with aura, not intractable, without status migrainosus: Secondary | ICD-10-CM

## 2019-05-02 NOTE — Telephone Encounter (Signed)
Occular migraines do not typically cause chest pain.  If she passes screening and we have openings, I would like to see her

## 2019-05-02 NOTE — Telephone Encounter (Signed)
Pt. Reports she had a 20 minutes of chest pain this morning. Pain is gone now. Concerned with her ocular migraines more than her episode of chest pain. States she does have an appointment next week with neurology, but wants to know if Dr. Birdie Riddle can do anything for her today. Please advise pt.  Answer Assessment - Initial Assessment Questions 1. LOCATION: "Where does it hurt?"       Middle 2. RADIATION: "Does the pain go anywhere else?" (e.g., into neck, jaw, arms, back)     No 3. ONSET: "When did the chest pain begin?" (Minutes, hours or days)      20 minutes 4. PATTERN "Does the pain come and go, or has it been constant since it started?"  "Does it get worse with exertion?"      Gone now 5. DURATION: "How long does it last" (e.g., seconds, minutes, hours)     Lasted 20 minutes 6. SEVERITY: "How bad is the pain?"  (e.g., Scale 1-10; mild, moderate, or severe)    - MILD (1-3): doesn't interfere with normal activities     - MODERATE (4-7): interferes with normal activities or awakens from sleep    - SEVERE (8-10): excruciating pain, unable to do any normal activities       7-8 7. CARDIAC RISK FACTORS: "Do you have any history of heart problems or risk factors for heart disease?" (e.g., prior heart attack, angina; high blood pressure, diabetes, being overweight, high cholesterol, smoking, or strong family history of heart disease)     No 8. PULMONARY RISK FACTORS: "Do you have any history of lung disease?"  (e.g., blood clots in lung, asthma, emphysema, birth control pills)     No 9. CAUSE: "What do you think is causing the chest pain?"     Unsure 10. OTHER SYMPTOMS: "Do you have any other symptoms?" (e.g., dizziness, nausea, vomiting, sweating, fever, difficulty breathing, cough)       Migraines 11. PREGNANCY: "Is there any chance you are pregnant?" "When was your last menstrual period?"       No  Protocols used: CHEST PAIN-A-AH

## 2019-05-02 NOTE — Patient Instructions (Signed)
Follow up as needed or as scheduled We'll call you with your Cardiology appt IF you have any additional chest pain, please let us know ASAP or go to the ER for evaluation If the headaches continue, please let your neurologist know I suspect the numbness is related to your ocular migraines but this also needs to be discussed w/ neurology Call with any questions or concerns Hang in there!

## 2019-05-02 NOTE — Telephone Encounter (Signed)
Can we see if pt is able to come in office for appt

## 2019-05-02 NOTE — Progress Notes (Signed)
Subjective:    Patient ID: Ann Conley, female    DOB: January 11, 1934, 85 y.o.   MRN: [id number]  HPI CP- pt reports 'severe pain right here' (substernal).  It lasted ~10 minutes and improved w/ ibuprofen.  Denies TTP.  No SOB.  No diaphoresis or nausea.  Pt described pain as a 'pressure and a dull ache'.  Pt was sitting in her [place] when the CP started.  Pt has been active today w/o recurrence of sxs.  Arm numbness- L arm, occurred this AM.  Has a hx of arm numbness that occurs w/ migraines.  She had an ocular migraine this AM that preceded the arm numbness.  She reports the numbness 'crawls up my arm but doesn't stay for any period of time'.  Arm is back to 'normal'.  'ice pick headaches'- has appt next week w/ Neuro.  Reports these are occurring more frequently.  Has had MRI w/o abnormality.  Currently asymptomatic.  Skin lesion- R temple.  First noticed 2 weeks ago.  Not painful.  'I thought I burned myself w/ a curling iron'.     Review of Systems For ROS see HPI     Objective:   Physical Exam Vitals signs reviewed.  Constitutional:      General: She is not in acute distress.    Appearance: She is well-developed.  HENT:     Head: Normocephalic and atraumatic.  Eyes:     Conjunctiva/sclera: Conjunctivae normal.     Pupils: Pupils are equal, round, and reactive to light.  Neck:     Musculoskeletal: Normal range of motion and neck supple.     Thyroid: No thyromegaly.  Cardiovascular:     Rate and Rhythm: Normal rate and regular rhythm.     Heart sounds: Normal heart sounds. No murmur.  Pulmonary:     Effort: Pulmonary effort is normal. No respiratory distress.     Breath sounds: Normal breath sounds.  Abdominal:     General: There is no distension.     Palpations: Abdomen is soft.     Tenderness: There is no abdominal tenderness.  Lymphadenopathy:     Cervical: No cervical adenopathy.  Skin:    General: Skin is warm and dry.     Comments: Shallow ulceration on R  temple concerning for non-melanoma skin cancer  Neurological:     Mental Status: She is alert and oriented to person, place, and time.     Motor: No weakness.     Comments: No abnormal skin sensation Symmetric UE reflexes  Psychiatric:        Behavior: Behavior normal.           Assessment & Plan:  Chest pain- new.  Pt reports she had a self limited episode this AM while sitting in her recliner that resolved after 10 minutes.  Denies associated SOB, N/V, diaphoresis.  Did have L arm numbness but reports this has been episodic and frequently associated w/ her complex migraines.  EKG is unchanged from 2012.  Given her age and BP, will refer to Cards for a complete evaluation.  She was given strict instructions to seek care if her CP returned.  Pt expressed understanding and is in agreement w/ plan.   L arm numbness- completely asymptomatic at time of visit.  She reports she tends to have this when she has her complex migraines.  Encouraged her to discuss this at her neuro appt next week.  Pt expressed understanding and is in agreement  w/ plan.   Complex migraines- she states she is getting these more frequently.  Asymptomatic at this time.  Has neuro appt scheduled for next week.  Skin lesion- new.  Concerning for non-melanoma skin cancer.  Will refer to Derm for complete evaluation and tx.

## 2019-05-02 NOTE — Telephone Encounter (Signed)
Patient coming in at 1:30 this afternoon

## 2019-05-02 NOTE — Telephone Encounter (Signed)
We still have some openings. If pt passes screening would you like for her to come in?

## 2019-05-05 ENCOUNTER — Telehealth: Payer: Self-pay | Admitting: *Deleted

## 2019-05-05 NOTE — Telephone Encounter (Signed)
Called patient to discuss her need for a follow up. She stated she saw her PCP on Friday, and she's been having ocular migraines more frequently. She also occasionally has ice pick headaches. Her PCP advised she discuss these issues further with Dr Leta Baptist. Updated EMR.

## 2019-05-06 ENCOUNTER — Other Ambulatory Visit: Payer: Self-pay

## 2019-05-06 ENCOUNTER — Encounter: Payer: Self-pay | Admitting: Diagnostic Neuroimaging

## 2019-05-06 ENCOUNTER — Ambulatory Visit (INDEPENDENT_AMBULATORY_CARE_PROVIDER_SITE_OTHER): Payer: Medicare HMO | Admitting: Diagnostic Neuroimaging

## 2019-05-06 DIAGNOSIS — G4485 Primary stabbing headache: Secondary | ICD-10-CM | POA: Diagnosis not present

## 2019-05-06 DIAGNOSIS — I10 Essential (primary) hypertension: Secondary | ICD-10-CM | POA: Diagnosis not present

## 2019-05-06 DIAGNOSIS — G43109 Migraine with aura, not intractable, without status migrainosus: Secondary | ICD-10-CM | POA: Diagnosis not present

## 2019-05-06 MED ORDER — PROPRANOLOL HCL 20 MG PO TABS: 20.0000 mg | ORAL_TABLET | Freq: Two times a day (BID) | ORAL | 6 refills | Status: DC

## 2019-05-06 NOTE — Progress Notes (Signed)
Virtual Visit via Video Note  I connected with Ann Conley on 05/06/19 at 11:30 AM EDT by a video enabled telemedicine application and verified that I am speaking with the correct person using two identifiers.   I discussed the limitations of evaluation and management by telemedicine and the availability of in person appointments. The patient expressed understanding and agreed to proceed.  Patient is at their home. I am at the office.    History of Present Illness:  UPDATE (05/06/19, VRP): Since last visit, having more ocular migraines (1 week on; 2 weeks off). Sometimes has bilateral arm heaviness with migraines. Symptoms are moderate. No alleviating or aggravating factors. Now patient more open to consider add'l meds.   UPDATE (01/07/19, VRP): Since last visit, doing well. Symptoms are resolved. Severity is mild. No alleviating or aggravating factors. Tolerating meds. Having 1-2 HA per month, and controlled with Excedrin migraine.  PRIOR HPI (09/14/18): 85 year old female here for evaluation:  STABBING HEADACHES Patient has had long history since childhood of intermittent sharp stabbing headaches lasting for a few seconds, on the right side of her head.  These can occur several times per day and several times per month.  Patient looked up online and diagnosed herself with ice pick headaches.  She does not take any medications for these symptoms because they are so short lasting.  They have slightly worsened in the last few months.  Patient has been under more stress with her husband's illness and other family issues.  MIGRAINE TYPE HA Since 2016 patient has had a different type of headache which consists of partial vision loss in the left eye, followed by severe global pounding throbbing headaches associated with photophobia.  No nausea or vomiting.  Headaches can last 2 hours or longer at a time.  She diagnosed herself with "ocular migraines".  She is taken over-the-counter  medications with mild relief.  These headaches have been worse than last 6 to 12 months, but have spontaneously resolved since August 2019.  LEFT ARM NUMBNESS 08/10/2018 patient noticed sudden onset of left arm numbness and weakness.  She was having trouble controlling her left arm.  She also noticed some numbness in her left face.  Symptoms lasted for 1 hour.  Patient went to the emergency room for evaluation.  CT of the head and lab testing were unremarkable.  Blood pressure was 175/85.  Apparently "low concern for TIA" was determined and patient was encouraged to follow-up with PCP for evaluation. On 08/23/2018 patient had recurrence of symptoms, with left arm and left face numbness and tingling.  Patient called PCP who recommended ER evaluation but patient declined to go.  HYPERTENSION Patient ran out of her blood pressure medications 2 days ago.  She says that her blood pressure is "always high".  She is on amlodipine 10 mg daily. Today blood pressure is 170/89.    Observations/Objective:  BP readings at home: 139/83, 135/74  - awake, alert - face symm - mild voice tremor   09/20/18 MRI of the brain without contrast shows the following: 1.   Moderate chronic microvascular ischemic changes.  None of the foci appears to be acute. 2.   Age-appropriate minimal to mild generalized cortical atrophy. 3.   There are no acute findings.   Assessment and Plan:  Dx:  1. Migraine with aura and without status migrainosus, not intractable   2. Primary stabbing headache   3. Essential hypertension      MIGRAINE WITH AURA (? new  since 2016) - start propranolol 20mg  twice a day for migraine prevention; may increase over time as needed - use ibuprofen, tylenol or excedrin migraine as needed  TIA (left face / arm numbness; left arm weakness x 1 hour; 08/10/18) - continue aspirin 81mg  daily - consider statin to lower LDL to goal < 70 for stroke prevention; although given age and patient  reluctance for new medications, may hold off for now - continue BP control   PRIMARY STABBING HEADACHE - consider indomethacin or melatonin   Meds ordered this encounter  Medications  . propranolol (INDERAL) 20 MG tablet    Sig: Take 1 tablet (20 mg total) by mouth 2 (two) times daily.    Dispense:  60 tablet    Refill:  6     Follow Up Instructions:  - Return in about 4 months (around 09/05/2019) for with NP (Amy Lomax).    I discussed the assessment and treatment plan with the patient. The patient was provided an opportunity to ask questions and all were answered. The patient agreed with the plan and demonstrated an understanding of the instructions.   The patient was advised to call back or seek an in-person evaluation if the symptoms worsen or if the condition fails to improve as anticipated.  I provided 25 minutes of non-face-to-face time during this encounter.   Penni Bombard, MD 3/52/4818, 59:09 AM Certified in Neurology, Neurophysiology and Neuroimaging  [facility] [address] [place], [place] [address] [phone]

## 2019-05-09 ENCOUNTER — Ambulatory Visit: Payer: Self-pay | Admitting: Family Medicine

## 2019-05-09 NOTE — Telephone Encounter (Signed)
Pt. Reports she has been having chest pain x 2 days. Comes and goes. Pain is 7/10 and lasts 1 hour. No radiation, no shortness of breath, sweating or dizziness. Instructed to go to ED for evaluation. Verbalizes understanding.  Reason for Disposition . [1] Intermittent  chest pain or "angina" AND [2] increasing in severity or frequency  (Exception: pains lasting a few seconds)  Answer Assessment - Initial Assessment Questions 1. LOCATION: "Where does it hurt?"       Left side  2. RADIATION: "Does the pain go anywhere else?" (e.g., into neck, jaw, arms, back)     No 3. ONSET: "When did the chest pain begin?" (Minutes, hours or days)      2 days ago 4. PATTERN "Does the pain come and go, or has it been constant since it started?"  "Does it get worse with exertion?"      Comes an dgoes 5. DURATION: "How long does it last" (e.g., seconds, minutes, hours)     Lasts 1 hour 6. SEVERITY: "How bad is the pain?"  (e.g., Scale 1-10; mild, moderate, or severe)    - MILD (1-3): doesn't interfere with normal activities     - MODERATE (4-7): interferes with normal activities or awakens from sleep    - SEVERE (8-10): excruciating pain, unable to do any normal activities       6-7 7. CARDIAC RISK FACTORS: "Do you have any history of heart problems or risk factors for heart disease?" (e.g., prior heart attack, angina; high blood pressure, diabetes, being overweight, high cholesterol, smoking, or strong family history of heart disease)     No 8. PULMONARY RISK FACTORS: "Do you have any history of lung disease?"  (e.g., blood clots in lung, asthma, emphysema, birth control pills)     No 9. CAUSE: "What do you think is causing the chest pain?"     Unsure 10. OTHER SYMPTOMS: "Do you have any other symptoms?" (e.g., dizziness, nausea, vomiting, sweating, fever, difficulty breathing, cough)       No 11. PREGNANCY: "Is there any chance you are pregnant?" "When was your last menstrual period?"        No  Protocols used: CHEST PAIN-A-AH

## 2019-05-12 NOTE — Telephone Encounter (Signed)
FYI

## 2019-05-12 NOTE — Telephone Encounter (Signed)
Please advise have you received anything on this.

## 2019-05-12 NOTE — Telephone Encounter (Signed)
Called pt and LMOVM to find out how pt is feeling.

## 2019-05-12 NOTE — Telephone Encounter (Signed)
I have no record of pt receiving any care for her CP.  Please call and make sure she is ok.

## 2019-05-13 NOTE — Telephone Encounter (Signed)
Called pt again today and LMOVM to return call.

## 2019-05-13 NOTE — Telephone Encounter (Signed)
Called pt again. Could not leave a message. Closing encounter until pt returns call.

## 2019-06-11 ENCOUNTER — Other Ambulatory Visit: Payer: Self-pay | Admitting: Family Medicine

## 2019-06-13 ENCOUNTER — Other Ambulatory Visit: Payer: Self-pay

## 2019-06-13 ENCOUNTER — Ambulatory Visit: Payer: Medicare HMO | Admitting: Cardiovascular Disease

## 2019-06-13 ENCOUNTER — Encounter: Payer: Self-pay | Admitting: Cardiovascular Disease

## 2019-06-13 DIAGNOSIS — I10 Essential (primary) hypertension: Secondary | ICD-10-CM | POA: Diagnosis not present

## 2019-06-13 DIAGNOSIS — R0789 Other chest pain: Secondary | ICD-10-CM | POA: Diagnosis not present

## 2019-06-13 NOTE — Assessment & Plan Note (Signed)
History of atypical chest pain several months ago without recurrence.  No work-up required at this time

## 2019-06-13 NOTE — Progress Notes (Signed)
06/13/2019 [name]   [birth year]  [id number]  Primary Physician Midge Minium, MD Primary Cardiologist: Lorretta Harp MD Garret Reddish, [place], [place]  HPI:  Ann Conley is a 85 y.o. moderately overweight married Caucasian female mother of 55, grandmother 58 grandchildren referred by Dr. Birdie Riddle for cardiovascular valuation because of an episode of atypical chest pain.  She does have a history of treated hypertension and family history with a father who died of a myocardial infarction.  She is never had a heart attack but has had a "questionable stroke".  She is a retired sixth Land and taught reading as well.  She has had no chest pain symptoms in the last several months.   Current Meds  Medication Sig  . amLODipine (NORVASC) 10 MG tablet TAKE ONE TABLET BY MOUTH DAILY  . Ascorbic Acid (VITAMIN C PO) Take by mouth.  Marland Kitchen aspirin 81 MG tablet Take 81 mg by mouth every evening.   Marland Kitchen aspirin-acetaminophen-caffeine (EXCEDRIN MIGRAINE) 250-250-65 MG tablet Take by mouth every 6 (six) hours as needed for headache.  . ibuprofen (ADVIL,MOTRIN) 200 MG tablet Take 200 mg by mouth every 6 (six) hours as needed.  . Multiple Vitamins-Minerals (MULTIVITAMIN ADULT PO) Take 1 tablet by mouth daily.   . multivitamin-lutein (OCUVITE-LUTEIN) CAPS capsule Take 1 capsule by mouth daily.  Marland Kitchen omeprazole (PRILOSEC) 20 MG capsule TAKE ONE CAPSULE BY MOUTH DAILY  . propranolol (INDERAL) 20 MG tablet Take 1 tablet (20 mg total) by mouth 2 (two) times daily.  . vitamin B-12 (CYANOCOBALAMIN) 100 MCG tablet Take 100 mcg by mouth daily.     Allergies  Allergen Reactions  . Codeine Nausea And Vomiting    Social History   Socioeconomic History  . Marital status: Married    Spouse name: Not on file  . Number of children: Not on file  . Years of education: Not on file  . Highest education level: Not on file  Occupational History  . Not on file  Social Needs  . Financial resource  strain: Not on file  . Food insecurity    Worry: Not on file    Inability: Not on file  . Transportation needs    Medical: Not on file    Non-medical: Not on file  Tobacco Use  . Smoking status: Never Smoker  . Smokeless tobacco: Never Used  Substance and Sexual Activity  . Alcohol use: Yes    Alcohol/week: 0.0 standard drinks    Comment: wine occ.  . Drug use: No  . Sexual activity: Not on file  Lifestyle  . Physical activity    Days per week: Not on file    Minutes per session: Not on file  . Stress: Not on file  Relationships  . Social Herbalist on phone: Not on file    Gets together: Not on file    Attends religious service: Not on file    Active member of club or organization: Not on file    Attends meetings of clubs or organizations: Not on file    Relationship status: Not on file  . Intimate partner violence    Fear of current or ex partner: Not on file    Emotionally abused: Not on file    Physically abused: Not on file    Forced sexual activity: Not on file  Other Topics Concern  . Not on file  Social History Narrative   Lives with husband.  5 children.  Education BA.     Review of Systems: General: negative for chills, fever, night sweats or weight changes.  Cardiovascular: negative for chest pain, dyspnea on exertion, edema, orthopnea, palpitations, paroxysmal nocturnal dyspnea or shortness of breath Dermatological: negative for rash Respiratory: negative for cough or wheezing Urologic: negative for hematuria Abdominal: negative for nausea, vomiting, diarrhea, bright red blood per rectum, melena, or hematemesis Neurologic: negative for visual changes, syncope, or dizziness All other systems reviewed and are otherwise negative except as noted above.    Blood pressure (!) 150/70, pulse 69, temperature 97.9 F (36.6 C), height 5' 0.5" (1.537 m), weight 214 lb (97.1 kg).  General appearance: alert and no distress Neck: no adenopathy, no carotid  bruit, no JVD, supple, symmetrical, trachea midline and thyroid not enlarged, symmetric, no tenderness/mass/nodules Lungs: clear to auscultation bilaterally Heart: regular rate and rhythm, S1, S2 normal, no murmur, click, rub or gallop Extremities: extremities normal, atraumatic, no cyanosis or edema Pulses: 2+ and symmetric Skin: Skin color, texture, turgor normal. No rashes or lesions Neurologic: Alert and oriented X 3, normal strength and tone. Normal symmetric reflexes. Normal coordination and gait  EKG not performed today  ASSESSMENT AND PLAN:   HTN (hypertension) History of essential hypertension with blood pressure measured today at 150/70.  She is on amlodipine and propranolol.  Atypical chest pain History of atypical chest pain several months ago without recurrence.  No work-up required at this time      Lorretta Harp MD [facility], [facility] 06/13/2019 4:20 PM

## 2019-06-13 NOTE — Patient Instructions (Signed)
Medication Instructions:  Your physician recommends that you continue on your current medications as directed. Please refer to the Current Medication list given to you today.  If you need a refill on your cardiac medications before your next appointment, please call your pharmacy.   Lab work: NONE If you have labs (blood work) drawn today and your tests are completely normal, you will receive your results only by: Marland Kitchen MyChart Message (if you have MyChart) OR . A paper copy in the mail If you have any lab test that is abnormal or we need to change your treatment, we will call you to review the results.  Testing/Procedures: NONE  Follow-Up: At [facility], you and your health needs are our priority.  As part of our continuing mission to provide you with exceptional heart care, we have created designated Provider Care Teams.  These Care Teams include your primary Cardiologist (physician) and Advanced Practice Providers (APPs -  Physician Assistants and Nurse Practitioners) who all work together to provide you with the care you need, when you need it. You will need a follow up appointment in 6 months WITH AN APP AND IN 12 MONTHS WITH DR. Gwenlyn Found.  Please call our office 2 months in advance to schedule this appointment.  You may see one of the following Advanced Practice Providers on your designated Care Team:   Kerin Ransom, PA-C Roby Lofts, [place] . Sande Rives, PA-C

## 2019-06-13 NOTE — Assessment & Plan Note (Signed)
History of essential hypertension with blood pressure measured today at 150/70.  She is on amlodipine and propranolol.

## 2019-06-25 DIAGNOSIS — L820 Inflamed seborrheic keratosis: Secondary | ICD-10-CM | POA: Diagnosis not present

## 2019-06-25 DIAGNOSIS — L821 Other seborrheic keratosis: Secondary | ICD-10-CM | POA: Diagnosis not present

## 2019-07-17 ENCOUNTER — Other Ambulatory Visit: Payer: Self-pay | Admitting: Family Medicine

## 2019-09-11 ENCOUNTER — Ambulatory Visit (INDEPENDENT_AMBULATORY_CARE_PROVIDER_SITE_OTHER): Payer: Medicare HMO | Admitting: Family Medicine

## 2019-09-11 ENCOUNTER — Other Ambulatory Visit: Payer: Self-pay

## 2019-09-11 ENCOUNTER — Encounter: Payer: Self-pay | Admitting: Family Medicine

## 2019-09-11 VITALS — BP 121/78 | HR 78 | Temp 97.9°F | Resp 16 | Ht 61.0 in | Wt 216.1 lb

## 2019-09-11 DIAGNOSIS — Z0000 Encounter for general adult medical examination without abnormal findings: Secondary | ICD-10-CM

## 2019-09-11 DIAGNOSIS — I10 Essential (primary) hypertension: Secondary | ICD-10-CM

## 2019-09-11 LAB — CBC WITH DIFFERENTIAL/PLATELET
Basophils Absolute: 0 10*3/uL (ref 0.0–0.1)
Basophils Relative: 0.4 % (ref 0.0–3.0)
Eosinophils Absolute: 0.1 10*3/uL (ref 0.0–0.7)
Eosinophils Relative: 1.8 % (ref 0.0–5.0)
HCT: 37.9 % (ref 36.0–46.0)
Hemoglobin: 12.6 g/dL (ref 12.0–15.0)
Lymphocytes Relative: 24.8 % (ref 12.0–46.0)
Lymphs Abs: 1.6 10*3/uL (ref 0.7–4.0)
MCHC: 33.3 g/dL (ref 30.0–36.0)
MCV: 87.9 fl (ref 78.0–100.0)
Monocytes Absolute: 0.5 10*3/uL (ref 0.1–1.0)
Monocytes Relative: 7.5 % (ref 3.0–12.0)
Neutro Abs: 4.2 10*3/uL (ref 1.4–7.7)
Neutrophils Relative %: 65.5 % (ref 43.0–77.0)
Platelets: 252 10*3/uL (ref 150.0–400.0)
RBC: 4.32 Mil/uL (ref 3.87–5.11)
RDW: 14.4 % (ref 11.5–15.5)
WBC: 6.4 10*3/uL (ref 4.0–10.5)

## 2019-09-11 LAB — LIPID PANEL
Cholesterol: 202 mg/dL — ABNORMAL HIGH (ref 0–200)
HDL: 71.6 mg/dL (ref >39.00)
LDL Cholesterol: 111 mg/dL — ABNORMAL HIGH (ref 0–99)
NonHDL: 130.88
Total CHOL/HDL Ratio: 3
Triglycerides: 99 mg/dL (ref 0.0–149.0)
VLDL: 19.8 mg/dL (ref 0.0–40.0)

## 2019-09-11 LAB — HEPATIC FUNCTION PANEL
ALT: 18 U/L (ref 0–35)
AST: 17 U/L (ref 0–37)
Albumin: 4.3 g/dL (ref 3.5–5.2)
Alkaline Phosphatase: 99 U/L (ref 39–117)
Bilirubin, Direct: 0.1 mg/dL (ref 0.0–0.3)
Total Bilirubin: 0.4 mg/dL (ref 0.2–1.2)
Total Protein: 7 g/dL (ref 6.0–8.3)

## 2019-09-11 LAB — TSH: TSH: 2.32 u[IU]/mL (ref 0.35–4.50)

## 2019-09-11 LAB — BASIC METABOLIC PANEL
BUN: 15 mg/dL (ref 6–23)
CO2: 27 mEq/L (ref 19–32)
Calcium: 9.5 mg/dL (ref 8.4–10.5)
Chloride: 101 mEq/L (ref 96–112)
Creatinine, Ser: 0.55 mg/dL (ref 0.40–1.20)
GFR: 105.58 mL/min (ref >60.00)
Glucose, Bld: 122 mg/dL — ABNORMAL HIGH (ref 70–99)
Potassium: 4.2 mEq/L (ref 3.5–5.1)
Sodium: 137 mEq/L (ref 135–145)

## 2019-09-11 NOTE — Assessment & Plan Note (Signed)
Ongoing issue for pt.  Stressed need for healthy diet and regular exercise.  Check labs to risk stratify.  Will follow 

## 2019-09-11 NOTE — Assessment & Plan Note (Signed)
Chronic problem, well controlled.  Asymptomatic.  Check labs.  No anticipated med changes. °

## 2019-09-11 NOTE — Patient Instructions (Addendum)
Follow up in 6 months to recheck BP We'll notify you of your lab results and make any changes if needed Continue to work on healthy diet and regular exercise- you can do it! Call with any questions or concerns Stay Safe!  Stay Healthy!!   Preventive Care 65 Years and Older, Female Preventive care refers to lifestyle choices and visits with your health care provider that can promote health and wellness. This includes:  A yearly physical exam. This is also called an annual well check.  Regular dental and eye exams.  Immunizations.  Screening for certain conditions.  Healthy lifestyle choices, such as diet and exercise. What can I expect for my preventive care visit? Physical exam Your health care provider will check:  Height and weight. These may be used to calculate body mass index (BMI), which is a measurement that tells if you are at a healthy weight.  Heart rate and blood pressure.  Your skin for abnormal spots. Counseling Your health care provider may ask you questions about:  Alcohol, tobacco, and drug use.  Emotional well-being.  Home and relationship well-being.  Sexual activity.  Eating habits.  History of falls.  Memory and ability to understand (cognition).  Work and work Statistician.  Pregnancy and menstrual history. What immunizations do I need?  Influenza (flu) vaccine  This is recommended every year. Tetanus, diphtheria, and pertussis (Tdap) vaccine  You may need a Td booster every 10 years. Varicella (chickenpox) vaccine  You may need this vaccine if you have not already been vaccinated. Zoster (shingles) vaccine  You may need this after age 106. Pneumococcal conjugate (PCV13) vaccine  One dose is recommended after age 41. Pneumococcal polysaccharide (PPSV23) vaccine  One dose is recommended after age 73. Measles, mumps, and rubella (MMR) vaccine  You may need at least one dose of MMR if you were born in 1957 or later. You may also  need a second dose. Meningococcal conjugate (MenACWY) vaccine  You may need this if you have certain conditions. Hepatitis A vaccine  You may need this if you have certain conditions or if you travel or work in places where you may be exposed to hepatitis A. Hepatitis B vaccine  You may need this if you have certain conditions or if you travel or work in places where you may be exposed to hepatitis B. Haemophilus influenzae type b (Hib) vaccine  You may need this if you have certain conditions. You may receive vaccines as individual doses or as more than one vaccine together in one shot (combination vaccines). Talk with your health care provider about the risks and benefits of combination vaccines. What tests do I need? Blood tests  Lipid and cholesterol levels. These may be checked every 5 years, or more frequently depending on your overall health.  Hepatitis C test.  Hepatitis B test. Screening  Lung cancer screening. You may have this screening every year starting at age 48 if you have a 30-pack-year history of smoking and currently smoke or have quit within the past 15 years.  Colorectal cancer screening. All adults should have this screening starting at age 48 and continuing until age 33. Your health care provider may recommend screening at age 65 if you are at increased risk. You will have tests every 1-10 years, depending on your results and the type of screening test.  Diabetes screening. This is done by checking your blood sugar (glucose) after you have not eaten for a while (fasting). You may have this done  every 1-3 years.  Mammogram. This may be done every 1-2 years. Talk with your health care provider about how often you should have regular mammograms.  BRCA-related cancer screening. This may be done if you have a family history of breast, ovarian, tubal, or peritoneal cancers. Other tests  Sexually transmitted disease (STD) testing.  Bone density scan. This is done  to screen for osteoporosis. You may have this done starting at age 33. Follow these instructions at home: Eating and drinking  Eat a diet that includes fresh fruits and vegetables, whole grains, lean protein, and low-fat dairy products. Limit your intake of foods with high amounts of sugar, saturated fats, and salt.  Take vitamin and mineral supplements as recommended by your health care provider.  Do not drink alcohol if your health care provider tells you not to drink.  If you drink alcohol: ? Limit how much you have to 0-1 drink a day. ? Be aware of how much alcohol is in your drink. In the [place] one drink equals one 12 oz bottle of beer (355 mL), one 5 oz glass of wine (148 mL), or one 1 oz glass of hard liquor (44 mL). Lifestyle  Take daily care of your teeth and gums.  Stay active. Exercise for at least 30 minutes on 5 or more days each week.  Do not use any products that contain nicotine or tobacco, such as cigarettes, e-cigarettes, and chewing tobacco. If you need help quitting, ask your health care provider.  If you are sexually active, practice safe sex. Use a condom or other form of protection in order to prevent STIs (sexually transmitted infections).  Talk with your health care provider about taking a low-dose aspirin or statin. What's next?  Go to your health care provider once a year for a well check visit.  Ask your health care provider how often you should have your eyes and teeth checked.  Stay up to date on all vaccines. This information is not intended to replace advice given to you by your health care provider. Make sure you discuss any questions you have with your health care provider. Document Released: 11/19/2015 Document Revised: 10/17/2018 Document Reviewed: 10/17/2018 Elsevier Patient Education  2020 [employer].

## 2019-09-11 NOTE — Assessment & Plan Note (Signed)
Pt's PE WNL w/ exception of obesity.  UTD on mammo, pneumonia vaccines.  Declines flu.  No longer doing colonoscopy and refuses DEXA.  Check labs.  Anticipatory guidance provided.

## 2019-09-11 NOTE — Progress Notes (Signed)
   Subjective:    Patient ID: Ann Conley, female    DOB: 1934-10-17, 85 y.o.   MRN: [id number]  HPI Here today for MWV and CPE.  Risk Factors: HTN- chronic problem, on Amlodipine 10mg  daily, Propranolol 20mg  BID Obesity- BMI is 40.84.  Pt admits to no regular exercise nor formal diet. Physical Activity: no regular exercise Fall Risk: low Depression: denies current sxs Hearing: normal to conversational tones but decreased to whispered voice ADL's: independent Cognitive: normal linear thought process, memory and attention intact Home Safety: safe at home, lives w/ husband Frank Height, Weight, BMI, Visual Acuity: see vitals, vision corrected to w/ glasses Counseling: UTD on mammo, no longer doing colonoscopy, declines flu.  UTD on pneumonia series.  Declines DEXA Labs Ordered: See A&P Care Plan: See A&P    Patient Care Team    Relationship Specialty Notifications Start End  Midge Minium, MD PCP - General Family Medicine  02/27/12    Comment: Noah Charon, Pearletha Forge, MD Consulting Physician Cardiology  09/11/19   Penni Bombard, MD Consulting Physician Neurology  09/11/19   Calvert Cantor, MD Consulting Physician Ophthalmology  09/11/19     Review of Systems Patient reports no vision/ hearing changes, adenopathy,fever, weight change,  persistant/recurrent hoarseness , swallowing issues, chest pain, palpitations, edema, persistant/recurrent cough, hemoptysis, dyspnea (rest/exertional/paroxysmal nocturnal), gastrointestinal bleeding (melena, rectal bleeding), abdominal pain, significant heartburn, bowel changes, GU symptoms (dysuria, hematuria, incontinence), Gyn symptoms (abnormal  bleeding, pain),  syncope, focal weakness, memory loss, numbness & tingling, skin/hair/nail changes, abnormal bruising or bleeding, anxiety, or depression.     Objective:   Physical Exam General Appearance:    Alert, cooperative, no distress, appears stated age, obese  Head:     Normocephalic, without obvious abnormality, atraumatic  Eyes:    PERRL, conjunctiva/corneas clear, EOM's intact, fundi    benign, both eyes  Ears:    Normal TM's and external ear canals, both ears  Nose:   Nares normal, septum midline, mucosa normal, no drainage    or sinus tenderness  Throat:   Deferred due to COVID  Neck:   Supple, symmetrical, trachea midline, no adenopathy;    Thyroid: no enlargement/tenderness/nodules  Back:     Symmetric, no curvature, ROM normal, no CVA tenderness  Lungs:     Clear to auscultation bilaterally, respirations unlabored  Chest Wall:    No tenderness or deformity   Heart:    Regular rate and rhythm, S1 and S2 normal, no murmur, rub   or gallop  Breast Exam:    Deferred to mammo  Abdomen:     Soft, non-tender, bowel sounds active all four quadrants,    no masses, no organomegaly  Genitalia:    Deferred  Rectal:    Extremities:   Extremities normal, atraumatic, no cyanosis or edema  Pulses:   2+ and symmetric all extremities  Skin:   Skin color, texture, turgor normal, no rashes or lesions  Lymph nodes:   Cervical, supraclavicular, and axillary nodes normal  Neurologic:   CNII-XII intact, normal strength, sensation and reflexes    throughout          Assessment & Plan:

## 2019-09-12 ENCOUNTER — Other Ambulatory Visit (INDEPENDENT_AMBULATORY_CARE_PROVIDER_SITE_OTHER): Payer: Medicare HMO

## 2019-09-12 DIAGNOSIS — R7309 Other abnormal glucose: Secondary | ICD-10-CM

## 2019-09-12 LAB — HEMOGLOBIN A1C: Hgb A1c MFr Bld: 5.7 % (ref 4.6–6.5)

## 2019-09-30 DIAGNOSIS — G43819 Other migraine, intractable, without status migrainosus: Secondary | ICD-10-CM | POA: Diagnosis not present

## 2019-09-30 DIAGNOSIS — H26493 Other secondary cataract, bilateral: Secondary | ICD-10-CM | POA: Diagnosis not present

## 2019-11-13 ENCOUNTER — Other Ambulatory Visit: Payer: Self-pay | Admitting: Family Medicine

## 2020-01-20 ENCOUNTER — Other Ambulatory Visit: Payer: Self-pay | Admitting: Family Medicine

## 2020-02-02 ENCOUNTER — Other Ambulatory Visit: Payer: Self-pay

## 2020-02-02 ENCOUNTER — Telehealth (INDEPENDENT_AMBULATORY_CARE_PROVIDER_SITE_OTHER): Payer: Medicare HMO | Admitting: Physician Assistant

## 2020-02-02 ENCOUNTER — Encounter: Payer: Self-pay | Admitting: Physician Assistant

## 2020-02-02 DIAGNOSIS — G43109 Migraine with aura, not intractable, without status migrainosus: Secondary | ICD-10-CM

## 2020-02-02 NOTE — Progress Notes (Signed)
I have discussed the procedure for the virtual visit with the patient who has given consent to proceed with assessment and treatment.   Pierce Biagini S Jaquez Farrington, CMA     

## 2020-02-02 NOTE — Progress Notes (Signed)
Virtual Visit via Video   I connected with patient on 02/02/20 at  3:30 PM EDT by a video enabled telemedicine application and verified that I am speaking with the correct person using two identifiers.  Location patient: Home Location provider: [facility] Persons participating in the virtual visit: Patient, Provider, [place] (Patina Moore)  I discussed the limitations of evaluation and management by telemedicine and the availability of in person appointments. The patient expressed understanding and agreed to proceed.  Subjective:   HPI:   Patient with history of ocular migraine and stabbing headache presents via [facility] today c/o intermittent headache over the past couple of days. Notes these episodes are similar to prior episodes of ocular migraine starting with an aura and some vision changes, occasionally with actual headache, R unilateral.  Notes she had headache last night only for which she took Excedrin. Resolved on waking this morning. Just mild aura this morning that has resolved. Denies dizziness, LH, AMS.Denies recent change in medication. Has recently started a keto diet about a couple of months ago. Is keeping well-hydrated. Is currently prescribed Propranolol 20 mg BID by Neurology for migraine prevention. Notes she has only taken once daily, when she takes it. Asymptomatic at time of visit.    ROS:   See pertinent positives and negatives per HPI.  Patient Active Problem List   Diagnosis Date Noted  . 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 Primary osteoarthritis of both knees 06/01/2017  . 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 Bleeding hemorrhoid 04/13/2016  . Complicated migraine [id number]  . Keratoma 10/19/2014  . Pain in lower limb 10/19/2014  . Hammer toe of left foot 10/19/2014  . Morbid obesity ([place]) 09/17/2014  . Snoring 03/13/2013  . Left anterior knee pain 03/13/2013  . Mole (skin) 09/18/2011  . Migraine with aura 05/07/2011  . HTN  (hypertension) 04/25/2011  . GERD (gastroesophageal reflux disease) 04/25/2011  . Annual physical exam 04/25/2011  . BREAST CANCER, HX OF 06/21/2007    Social History   Tobacco Use  . Smoking status: Never Smoker  . Smokeless tobacco: Never Used  Substance Use Topics  . Alcohol use: Yes    Alcohol/week: 0.0 standard drinks    Comment: wine occ.    Current Outpatient Medications:  .  amLODipine (NORVASC) 10 MG tablet, TAKE ONE TABLET BY MOUTH DAILY, Disp: 90 tablet, Rfl: 1 .  Ascorbic Acid (VITAMIN C PO), Take by mouth., Disp: , Rfl:  .  aspirin 81 MG tablet, Take 81 mg by mouth every evening. , Disp: , Rfl:  .  aspirin-acetaminophen-caffeine (EXCEDRIN MIGRAINE) 250-250-65 MG tablet, Take by mouth every 6 (six) hours as needed for headache., Disp: , Rfl:  .  ibuprofen (ADVIL,MOTRIN) 200 MG tablet, Take 200 mg by mouth every 6 (six) hours as needed., Disp: , Rfl:  .  Multiple Vitamins-Minerals (MULTIVITAMIN ADULT PO), Take 1 tablet by mouth daily. , Disp: , Rfl:  .  multivitamin-lutein (OCUVITE-LUTEIN) CAPS capsule, Take 1 capsule by mouth daily., Disp: , Rfl:  .  omeprazole (PRILOSEC) 20 MG capsule, TAKE ONE CAPSULE BY MOUTH DAILY, Disp: 90 capsule, Rfl: 0 .  propranolol (INDERAL) 20 MG tablet, Take 1 tablet (20 mg total) by mouth 2 (two) times daily., Disp: 60 tablet, Rfl: 6 .  vitamin B-12 (CYANOCOBALAMIN) 100 MCG tablet, Take 100 mcg by mouth daily., Disp: , Rfl:   Allergies  Allergen Reactions  . Codeine Nausea And Vomiting    Objective:   There were no vitals  taken for this visit.  Patient is well-developed, well-nourished in no acute distress.  Resting comfortably at home.  Head is normocephalic, atraumatic.  No labored breathing.  Speech is clear and coherent with logical content.  Patient is alert and oriented at baseline.  CN intact on video exam testing.  Assessment and Plan:   1. Ocular migraine Typical for her prior history per patient with the exception of  having a couple in a row. Resolved now. Discussed proper diet and hydration. She is to start the Propranolol as directed, keeping close watch on her BP and HR. Tylenol for mild headache to keep from too much NSAIDs. Excedrin if needed. Will call her Wednesday to check in with her and make further adjustments at that time if needed. ER precautions reviewed. Discussed she needs to contact her Neurologist if these are becoming more frequent.     Leeanne Rio, [place] 02/02/2020

## 2020-02-21 ENCOUNTER — Other Ambulatory Visit: Payer: Self-pay | Admitting: Family Medicine

## 2020-03-09 ENCOUNTER — Encounter: Payer: Self-pay | Admitting: Family Medicine

## 2020-03-09 ENCOUNTER — Ambulatory Visit (INDEPENDENT_AMBULATORY_CARE_PROVIDER_SITE_OTHER): Payer: Medicare HMO | Admitting: Family Medicine

## 2020-03-09 ENCOUNTER — Other Ambulatory Visit: Payer: Self-pay

## 2020-03-09 VITALS — BP 134/86 | HR 67 | Temp 98.7°F | Resp 16 | Ht 61.0 in | Wt 220.2 lb

## 2020-03-09 DIAGNOSIS — I10 Essential (primary) hypertension: Secondary | ICD-10-CM | POA: Diagnosis not present

## 2020-03-09 DIAGNOSIS — G43109 Migraine with aura, not intractable, without status migrainosus: Secondary | ICD-10-CM

## 2020-03-09 LAB — BASIC METABOLIC PANEL
BUN: 13 mg/dL (ref 6–23)
CO2: 30 mEq/L (ref 19–32)
Calcium: 8.9 mg/dL (ref 8.4–10.5)
Chloride: 100 mEq/L (ref 96–112)
Creatinine, Ser: 0.48 mg/dL (ref 0.40–1.20)
GFR: 123.39 mL/min (ref >60.00)
Glucose, Bld: 107 mg/dL — ABNORMAL HIGH (ref 70–99)
Potassium: 4.3 mEq/L (ref 3.5–5.1)
Sodium: 136 mEq/L (ref 135–145)

## 2020-03-09 LAB — LIPID PANEL
Cholesterol: 172 mg/dL (ref 0–200)
HDL: 54.2 mg/dL (ref >39.00)
LDL Cholesterol: 104 mg/dL — ABNORMAL HIGH (ref 0–99)
NonHDL: 118.06
Total CHOL/HDL Ratio: 3
Triglycerides: 70 mg/dL (ref 0.0–149.0)
VLDL: 14 mg/dL (ref 0.0–40.0)

## 2020-03-09 LAB — CBC WITH DIFFERENTIAL/PLATELET
Basophils Absolute: 0.1 10*3/uL (ref 0.0–0.1)
Basophils Relative: 1.2 % (ref 0.0–3.0)
Eosinophils Absolute: 0.2 10*3/uL (ref 0.0–0.7)
Eosinophils Relative: 2.1 % (ref 0.0–5.0)
HCT: 33.1 % — ABNORMAL LOW (ref 36.0–46.0)
Hemoglobin: 11 g/dL — ABNORMAL LOW (ref 12.0–15.0)
Lymphocytes Relative: 17.8 % (ref 12.0–46.0)
Lymphs Abs: 1.4 10*3/uL (ref 0.7–4.0)
MCHC: 33.2 g/dL (ref 30.0–36.0)
MCV: 85.6 fl (ref 78.0–100.0)
Monocytes Absolute: 0.6 10*3/uL (ref 0.1–1.0)
Monocytes Relative: 7 % (ref 3.0–12.0)
Neutro Abs: 5.8 10*3/uL (ref 1.4–7.7)
Neutrophils Relative %: 71.9 % (ref 43.0–77.0)
Platelets: 366 10*3/uL (ref 150.0–400.0)
RBC: 3.87 Mil/uL (ref 3.87–5.11)
RDW: 14.6 % (ref 11.5–15.5)
WBC: 8.1 10*3/uL (ref 4.0–10.5)

## 2020-03-09 LAB — HEPATIC FUNCTION PANEL
ALT: 36 U/L — ABNORMAL HIGH (ref 0–35)
AST: 24 U/L (ref 0–37)
Albumin: 3.6 g/dL (ref 3.5–5.2)
Alkaline Phosphatase: 90 U/L (ref 39–117)
Bilirubin, Direct: 0.1 mg/dL (ref 0.0–0.3)
Total Bilirubin: 0.4 mg/dL (ref 0.2–1.2)
Total Protein: 6.6 g/dL (ref 6.0–8.3)

## 2020-03-09 LAB — TSH: TSH: 2.43 u[IU]/mL (ref 0.35–4.50)

## 2020-03-09 NOTE — Patient Instructions (Signed)
Schedule your complete physical in 6 months We'll notify you of your lab results and make any changes if needed DECREASE the Propranolol to twice daily for 2 weeks and then if BP and headaches allow, decrease to 1 tab daily Pay attention to your headaches- frequency, severity, duration- as you wean down the propranolol Try and get some regular exercise- walking is a great start! Call with any questions or concerns Happy Mother's Day!

## 2020-03-09 NOTE — Assessment & Plan Note (Signed)
Again stressed need for healthy diet and exercise as able.  Check labs to risk stratify.  Will follow.

## 2020-03-09 NOTE — Assessment & Plan Note (Signed)
Ongoing issue for pt.  She has not had an aura in ~5 months.  Last HA was ~2 weeks ago.  She wants to wean her propranolol due to fatigue and some SOB.  Will decrease to BID and then if HAs allow, will decrease to daily.  Pt expressed understanding and is in agreement w/ plan.

## 2020-03-09 NOTE — Assessment & Plan Note (Signed)
Chronic problem.  Adequate control.  Asymptomatic for the most part but has had increased fatigue and SOB since increasing her propranolol to TID (she was advised BID but there was some confusion)  Will decrease Propranolol to BID and then daily if BP and HAs allow.  Pt expressed understanding and is in agreement w/ plan.

## 2020-03-09 NOTE — Progress Notes (Signed)
   Subjective:    Patient ID: Ann Conley, female    DOB: Jul 05, 1934, 86 y.o.   MRN: [id number]  HPI HTN- chronic problem, on Amlodipine 10mg  daily, Propranolol 20mg  TID w/ good control.  No CP, visual changes, edema.  Has not been walking regularly due to increased fatigue.  Migraine- pt reports she increased her Propranolol to 3x/day in hopes of preventing headaches.  This has caused excessive fatigue, some SOB.  Has not called Neuro to f/u.  Last HA was 2 weeks ago.  No aura for ~5 months  Obesity- she has gained another 4 lbs.  BMI is 41.62  Has not been able to exercise due to fatigue.  Due for mammo- appt upcoming in June  Review of Systems For ROS see HPI   This visit occurred during the SARS-CoV-2 public health emergency.  Safety protocols were in place, including screening questions prior to the visit, additional usage of staff PPE, and extensive cleaning of exam room while observing appropriate contact time as indicated for disinfecting solutions.       Objective:   Physical Exam Vitals reviewed.  Constitutional:      General: She is not in acute distress.    Appearance: Normal appearance. She is well-developed. She is obese.  HENT:     Head: Normocephalic and atraumatic.  Eyes:     Conjunctiva/sclera: Conjunctivae normal.     Pupils: Pupils are equal, round, and reactive to light.  Neck:     Thyroid: No thyromegaly.  Cardiovascular:     Rate and Rhythm: Normal rate and regular rhythm.     Heart sounds: Normal heart sounds. No murmur.  Pulmonary:     Effort: Pulmonary effort is normal. No respiratory distress.     Breath sounds: Normal breath sounds.  Abdominal:     General: There is no distension.     Palpations: Abdomen is soft.     Tenderness: There is no abdominal tenderness.  Musculoskeletal:     Cervical back: Normal range of motion and neck supple.  Lymphadenopathy:     Cervical: No cervical adenopathy.  Skin:    General: Skin is warm and dry.    Neurological:     Mental Status: She is alert and oriented to person, place, and time.  Psychiatric:        Behavior: Behavior normal.           Assessment & Plan:

## 2020-03-12 NOTE — Progress Notes (Signed)
Called pt and lmovm to return call.

## 2020-03-15 ENCOUNTER — Other Ambulatory Visit: Payer: Self-pay

## 2020-03-15 DIAGNOSIS — R710 Precipitous drop in hematocrit: Secondary | ICD-10-CM

## 2020-04-15 ENCOUNTER — Ambulatory Visit (INDEPENDENT_AMBULATORY_CARE_PROVIDER_SITE_OTHER): Payer: Medicare HMO

## 2020-04-15 ENCOUNTER — Other Ambulatory Visit: Payer: Self-pay

## 2020-04-15 DIAGNOSIS — R710 Precipitous drop in hematocrit: Secondary | ICD-10-CM

## 2020-04-16 LAB — CBC WITH DIFFERENTIAL/PLATELET
Basophils Absolute: 0.1 10*3/uL (ref 0.0–0.1)
Basophils Relative: 1.7 % (ref 0.0–3.0)
Eosinophils Absolute: 0.2 10*3/uL (ref 0.0–0.7)
Eosinophils Relative: 3.5 % (ref 0.0–5.0)
HCT: 37.8 % (ref 36.0–46.0)
Hemoglobin: 12.3 g/dL (ref 12.0–15.0)
Lymphocytes Relative: 25.2 % (ref 12.0–46.0)
Lymphs Abs: 1.6 10*3/uL (ref 0.7–4.0)
MCHC: 32.5 g/dL (ref 30.0–36.0)
MCV: 86 fl (ref 78.0–100.0)
Monocytes Absolute: 0.5 10*3/uL (ref 0.1–1.0)
Monocytes Relative: 8.5 % (ref 3.0–12.0)
Neutro Abs: 3.8 10*3/uL (ref 1.4–7.7)
Neutrophils Relative %: 61.1 % (ref 43.0–77.0)
Platelets: 273 10*3/uL (ref 150.0–400.0)
RBC: 4.4 Mil/uL (ref 3.87–5.11)
RDW: 15.7 % — ABNORMAL HIGH (ref 11.5–15.5)
WBC: 6.2 10*3/uL (ref 4.0–10.5)

## 2020-04-16 NOTE — Progress Notes (Signed)
Called patient and LMOVM to return call.

## 2020-05-04 DIAGNOSIS — Z853 Personal history of malignant neoplasm of breast: Secondary | ICD-10-CM | POA: Diagnosis not present

## 2020-05-04 DIAGNOSIS — I10 Essential (primary) hypertension: Secondary | ICD-10-CM | POA: Diagnosis not present

## 2020-05-04 DIAGNOSIS — Z803 Family history of malignant neoplasm of breast: Secondary | ICD-10-CM | POA: Diagnosis not present

## 2020-05-04 DIAGNOSIS — Z8249 Family history of ischemic heart disease and other diseases of the circulatory system: Secondary | ICD-10-CM | POA: Diagnosis not present

## 2020-05-04 DIAGNOSIS — Z7982 Long term (current) use of aspirin: Secondary | ICD-10-CM | POA: Diagnosis not present

## 2020-05-04 DIAGNOSIS — Z6841 Body Mass Index (BMI) 40.0 and over, adult: Secondary | ICD-10-CM | POA: Diagnosis not present

## 2020-05-04 DIAGNOSIS — M1990 Unspecified osteoarthritis, unspecified site: Secondary | ICD-10-CM | POA: Diagnosis not present

## 2020-05-04 DIAGNOSIS — Z008 Encounter for other general examination: Secondary | ICD-10-CM | POA: Diagnosis not present

## 2020-05-04 DIAGNOSIS — Z833 Family history of diabetes mellitus: Secondary | ICD-10-CM | POA: Diagnosis not present

## 2020-05-04 DIAGNOSIS — K219 Gastro-esophageal reflux disease without esophagitis: Secondary | ICD-10-CM | POA: Diagnosis not present

## 2020-05-07 ENCOUNTER — Other Ambulatory Visit: Payer: Self-pay | Admitting: Physician Assistant

## 2020-05-07 DIAGNOSIS — D492 Neoplasm of unspecified behavior of bone, soft tissue, and skin: Secondary | ICD-10-CM

## 2020-05-11 ENCOUNTER — Other Ambulatory Visit: Payer: Self-pay

## 2020-05-11 ENCOUNTER — Encounter: Payer: Self-pay | Admitting: Physician Assistant

## 2020-05-11 ENCOUNTER — Ambulatory Visit (INDEPENDENT_AMBULATORY_CARE_PROVIDER_SITE_OTHER): Payer: Medicare HMO | Admitting: Physician Assistant

## 2020-05-11 VITALS — BP 132/80 | HR 90 | Temp 98.3°F | Resp 14 | Ht 61.0 in | Wt 212.0 lb

## 2020-05-11 DIAGNOSIS — G43109 Migraine with aura, not intractable, without status migrainosus: Secondary | ICD-10-CM | POA: Diagnosis not present

## 2020-05-11 MED ORDER — KETOROLAC TROMETHAMINE 60 MG/2ML IM SOLN
60.0000 mg | Freq: Once | INTRAMUSCULAR | Status: AC
  Administered 2020-05-11: 60 mg via INTRAMUSCULAR

## 2020-05-11 MED ORDER — IBUPROFEN 800 MG PO TABS: 800.0000 mg | ORAL_TABLET | Freq: Three times a day (TID) | ORAL | 0 refills | Status: DC | PRN

## 2020-05-11 NOTE — Progress Notes (Signed)
Patient presents to clinic today c/o 3 days of intermittent migraine headaches each starting with her typical aura which she describes as flashing lights and some mild tunnel vision.  Notes headaches have been unilateral, right sided.  There is some associated photophobia and nausea.  Denies fever, chills, phonophobia or vomiting.  Denies AMS.  Has taken some over-the-counter Excedrin which was not beneficial.  Did take ibuprofen which helped abort her headache yesterday.  Woke up again with migraine headache today.  Patient is followed by neurology and currently on a regimen of propranolol 20 mg twice daily.  Endorses taking as directed.  Past Medical History:  Diagnosis Date  . Anemia   . Blood transfusion complicating pregnancy 8119  . GERD (gastroesophageal reflux disease)   . History of chicken pox   . Hypertension   . Migraine    occular  . TIA (transient ischemic attack)     Current Outpatient Medications on File Prior to Visit  Medication Sig Dispense Refill  . amLODipine (NORVASC) 10 MG tablet TAKE ONE TABLET BY MOUTH DAILY 90 tablet 1  . Ascorbic Acid (VITAMIN C PO) Take by mouth.    Marland Kitchen aspirin 81 MG tablet Take 81 mg by mouth every evening.     Marland Kitchen aspirin-acetaminophen-caffeine (EXCEDRIN MIGRAINE) 250-250-65 MG tablet Take by mouth every 6 (six) hours as needed for headache.    . Multiple Vitamins-Minerals (MULTIVITAMIN ADULT PO) Take 1 tablet by mouth daily.     . multivitamin-lutein (OCUVITE-LUTEIN) CAPS capsule Take 1 capsule by mouth daily.    Marland Kitchen omeprazole (PRILOSEC) 20 MG capsule TAKE ONE CAPSULE BY MOUTH DAILY 90 capsule 0  . propranolol (INDERAL) 20 MG tablet Take 1 tablet (20 mg total) by mouth 2 (two) times daily. 60 tablet 6  . vitamin B-12 (CYANOCOBALAMIN) 100 MCG tablet Take 100 mcg by mouth daily.     No current facility-administered medications on file prior to visit.    Allergies  Allergen Reactions  . Codeine Nausea And Vomiting    Family History    Problem Relation Age of Onset  . Breast cancer Mother   . Heart disease Father   . Heart disease Sister        pacemaker  . Diabetes Sister     Social History   Socioeconomic History  . Marital status: Married    Spouse name: Not on file  . Number of children: Not on file  . Years of education: Not on file  . Highest education level: Not on file  Occupational History  . Not on file  Tobacco Use  . Smoking status: Never Smoker  . Smokeless tobacco: Never Used  Vaping Use  . Vaping Use: Never used  Substance and Sexual Activity  . Alcohol use: Yes    Alcohol/week: 0.0 standard drinks    Comment: wine occ.  . Drug use: No  . Sexual activity: Not on file  Other Topics Concern  . Not on file  Social History Narrative   Lives with husband.  5 children.  Education BA.   Social Determinants of Health   Financial Resource Strain:   . Difficulty of Paying Living Expenses:   Food Insecurity:   . Worried About Charity fundraiser in the Last Year:   . Arboriculturist in the Last Year:   Transportation Needs:   . Film/video editor (Medical):   Marland Kitchen Lack of Transportation (Non-Medical):   Physical Activity:   . Days of Exercise  per Week:   . Minutes of Exercise per Session:   Stress:   . Feeling of Stress :   Social Connections:   . Frequency of Communication with Friends and Family:   . Frequency of Social Gatherings with Friends and Family:   . Attends Religious Services:   . Active Member of Clubs or Organizations:   . Attends Archivist Meetings:   Marland Kitchen Marital Status:    Review of Systems - See HPI.  All other ROS are negative.  BP 132/80   Pulse 90   Temp 98.3 F (36.8 C) (Temporal)   Resp 14   Ht 5\' 1"  (1.549 m)   Wt 212 lb (96.2 kg)   SpO2 95%   BMI 40.06 kg/m   Physical Exam Vitals reviewed.  Constitutional:      Appearance: She is well-developed.  HENT:     Head: Normocephalic and atraumatic.     Comments: Negative thickening or  tenderness of temporal arteries bilaterally. Eyes:     Extraocular Movements: Extraocular movements intact.     Pupils: Pupils are equal, round, and reactive to light.  Cardiovascular:     Rate and Rhythm: Normal rate and regular rhythm.     Heart sounds: Normal heart sounds.  Pulmonary:     Effort: Pulmonary effort is normal.     Breath sounds: Normal breath sounds.  Neurological:     Mental Status: She is alert.     Cranial Nerves: No cranial nerve deficit.     Coordination: Coordination normal.  Psychiatric:        Mood and Affect: Mood normal.    Recent Results (from the past 2160 hour(s))  Lipid panel     Status: Abnormal   Collection Time: 03/09/20 11:04 AM  Result Value Ref Range   Cholesterol 172 0 - 200 mg/dL    Comment: ATP III Classification       Desirable:  < 200 mg/dL               Borderline High:  200 - 239 mg/dL          High:  > = 240 mg/dL   Triglycerides 70.0 0 - 149 mg/dL    Comment: Normal:  <150 mg/dLBorderline High:  150 - 199 mg/dL   HDL 54.20 >39.00 mg/dL   VLDL 14.0 0.0 - 40.0 mg/dL   LDL Cholesterol 104 (H) 0 - 99 mg/dL   Total CHOL/HDL Ratio 3     Comment:                Men          Women1/2 Average Risk     3.4          3.3Average Risk          5.0          4.42X Average Risk          9.6          7.13X Average Risk          15.0          11.0                       NonHDL 118.06     Comment: NOTE:  Non-HDL goal should be 30 mg/dL higher than patient's LDL goal (i.e. LDL goal of < 70 mg/dL, would have non-HDL goal of < 100 mg/dL)  Basic metabolic panel  Status: Abnormal   Collection Time: 03/09/20 11:04 AM  Result Value Ref Range   Sodium 136 135 - 145 mEq/L   Potassium 4.3 3.5 - 5.1 mEq/L   Chloride 100 96 - 112 mEq/L   CO2 30 19 - 32 mEq/L   Glucose, Bld 107 (H) 70 - 99 mg/dL   BUN 13 6 - 23 mg/dL   Creatinine, Ser 0.48 0.40 - 1.20 mg/dL   GFR 123.39 >60.00 mL/min   Calcium 8.9 8.4 - 10.5 mg/dL  TSH     Status: None   Collection Time:  03/09/20 11:04 AM  Result Value Ref Range   TSH 2.43 0.35 - 4.50 uIU/mL  Hepatic function panel     Status: Abnormal   Collection Time: 03/09/20 11:04 AM  Result Value Ref Range   Total Bilirubin 0.4 0.2 - 1.2 mg/dL   Bilirubin, Direct 0.1 0.0 - 0.3 mg/dL   Alkaline Phosphatase 90 39 - 117 U/L   AST 24 0 - 37 U/L   ALT 36 (H) 0 - 35 U/L   Total Protein 6.6 6.0 - 8.3 g/dL   Albumin 3.6 3.5 - 5.2 g/dL  CBC with Differential/Platelet     Status: Abnormal   Collection Time: 03/09/20 11:04 AM  Result Value Ref Range   WBC 8.1 4.0 - 10.5 K/uL   RBC 3.87 3.87 - 5.11 Mil/uL   Hemoglobin 11.0 (L) 12.0 - 15.0 g/dL   HCT 33.1 (L) 36 - 46 %   MCV 85.6 78.0 - 100.0 fl   MCHC 33.2 30.0 - 36.0 g/dL   RDW 14.6 11.5 - 15.5 %   Platelets 366.0 150 - 400 K/uL   Neutrophils Relative % 71.9 43 - 77 %   Lymphocytes Relative 17.8 12 - 46 %   Monocytes Relative 7.0 3 - 12 %   Eosinophils Relative 2.1 0 - 5 %   Basophils Relative 1.2 0 - 3 %   Neutro Abs 5.8 1.4 - 7.7 K/uL   Lymphs Abs 1.4 0.7 - 4.0 K/uL   Monocytes Absolute 0.6 0 - 1 K/uL   Eosinophils Absolute 0.2 0 - 0 K/uL   Basophils Absolute 0.1 0 - 0 K/uL  CBC with Differential/Platelet     Status: Abnormal   Collection Time: 04/15/20  2:25 PM  Result Value Ref Range   WBC 6.2 4.0 - 10.5 K/uL   RBC 4.40 3.87 - 5.11 Mil/uL   Hemoglobin 12.3 12.0 - 15.0 g/dL   HCT 37.8 36 - 46 %   MCV 86.0 78.0 - 100.0 fl   MCHC 32.5 30.0 - 36.0 g/dL   RDW 15.7 (H) 11.5 - 15.5 %   Platelets 273.0 150 - 400 K/uL   Neutrophils Relative % 61.1 43 - 77 %   Lymphocytes Relative 25.2 12 - 46 %   Monocytes Relative 8.5 3 - 12 %   Eosinophils Relative 3.5 0 - 5 %   Basophils Relative 1.7 0 - 3 %   Neutro Abs 3.8 1.4 - 7.7 K/uL   Lymphs Abs 1.6 0.7 - 4.0 K/uL   Monocytes Absolute 0.5 0 - 1 K/uL   Eosinophils Absolute 0.2 0 - 0 K/uL   Basophils Absolute 0.1 0 - 0 K/uL   Assessment/Plan: 1. Migraine with aura and without status migrainosus, not  intractable Intermittent episodes of migraine headache over the past 3 days.  Headache present currently.  Examination overall unremarkable.  No evidence concerning for temporal arteritis on examination.  Neurological  examination within normal limits.  IM Toradol given today for abortive therapy.  Patient to continue her propranolol as directed.  Discussed other OTC medications for abortive therapy.  Trying to avoid triptans if possible due to age and medical history.  Recommend she schedule follow-up with her neurologist. - ketorolac (TORADOL) injection 60 mg  This visit occurred during the SARS-CoV-2 public health emergency.  Safety protocols were in place, including screening questions prior to the visit, additional usage of staff PPE, and extensive cleaning of exam room while observing appropriate contact time as indicated for disinfecting solutions.     Leeanne Rio, PA-C

## 2020-05-11 NOTE — Patient Instructions (Signed)
The Toradol given today should help break the migraine cycle. Please keep well-hydrated and get plenty of rest.  Make sure to try to eat a well-balanced diet. I have sent in a script for 800 mg Ibuprofen to use as directed if needed for acute migraine.  Please schedule follow-up with your neurologist.

## 2020-06-04 ENCOUNTER — Telehealth: Payer: Self-pay | Admitting: Family Medicine

## 2020-06-04 NOTE — Telephone Encounter (Signed)
It is ok for her to take the prescription ibuprofen but I would not recommend more than 3-5 consecutive days if possible.  And take w/ food.

## 2020-06-04 NOTE — Telephone Encounter (Signed)
Unsure of the reason for taking the ibuprofen. Is this ok for patient to take?

## 2020-06-04 NOTE — Telephone Encounter (Signed)
Patient notified of PCP recommendations and is agreement and expresses an understanding.  

## 2020-06-04 NOTE — Telephone Encounter (Signed)
Patient wants to know if she can take her prescription ibuprofen for 2-3 days at a time.  Please advise

## 2020-06-04 NOTE — Telephone Encounter (Signed)
Called pt to find out why she is taking ibuprofen. LMOVM. Please advise based on her meds ok to take this OTC?

## 2020-06-04 NOTE — Telephone Encounter (Signed)
Patient called back. She states she is taking the ibuprofen for migraines. She would like to know if this would be okay for her to take for several days of consecutive use.

## 2020-06-15 DIAGNOSIS — D1801 Hemangioma of skin and subcutaneous tissue: Secondary | ICD-10-CM | POA: Diagnosis not present

## 2020-06-15 DIAGNOSIS — L708 Other acne: Secondary | ICD-10-CM | POA: Diagnosis not present

## 2020-06-15 DIAGNOSIS — L918 Other hypertrophic disorders of the skin: Secondary | ICD-10-CM | POA: Diagnosis not present

## 2020-07-02 ENCOUNTER — Telehealth: Payer: Self-pay | Admitting: Family Medicine

## 2020-07-02 NOTE — Telephone Encounter (Signed)
Please advise 

## 2020-07-02 NOTE — Telephone Encounter (Signed)
I agree that she needs to go to ER for stroke like sxs but if she is refusing, she needs to call neurology for urgent appt rather than be seen here

## 2020-07-02 NOTE — Telephone Encounter (Signed)
Triage nurse called stating that she is recommending Ann Conley to go to the ER and patient is refusing - Please Advise

## 2020-07-02 NOTE — Telephone Encounter (Signed)
Please advise:  1.) why was pt sent to triage (symptoms, Red words used)?  2.) why did they tell her to go to the ER?  3.) did they say why she refused?   I need more information in order to contact the patient or have PCP review.

## 2020-07-02 NOTE — Telephone Encounter (Signed)
Called pt and stressed 3 times the need to go to ER for evaluation for stroke like symptoms. Pt advised that she didn't want to go. Stressed that she needed to contact her neurologist "I don't have a neurologist" advised that she sees Dr. Leta Baptist and her response was "oh, they are a neurologist" pt was adamant I give her the number to contact them. Pt was advised to go to ER and she stated "she would see what she could do"

## 2020-07-02 NOTE — Telephone Encounter (Signed)
Telephone Advice Record from Access Nurse is in Dr. Virgil Benedict bin - I did not send her but Danae Chen said that she was having blurred vision - she told triage nurse that it was a sudden loss or sudden decrease in vision.  Patient told nurse that she was having right arm numbness last night, but now it just feels heavier.  Patient refused and said that she wanted to be seen in the office.

## 2020-07-05 ENCOUNTER — Other Ambulatory Visit: Payer: Self-pay

## 2020-07-05 ENCOUNTER — Telehealth (INDEPENDENT_AMBULATORY_CARE_PROVIDER_SITE_OTHER): Payer: Medicare HMO | Admitting: Physician Assistant

## 2020-07-05 ENCOUNTER — Encounter: Payer: Self-pay | Admitting: Physician Assistant

## 2020-07-05 DIAGNOSIS — R200 Anesthesia of skin: Secondary | ICD-10-CM

## 2020-07-05 DIAGNOSIS — G43109 Migraine with aura, not intractable, without status migrainosus: Secondary | ICD-10-CM

## 2020-07-05 NOTE — Progress Notes (Signed)
Virtual Visit via Video   I connected with patient on 07/05/20 at  4:00 PM EDT by a video enabled telemedicine application and verified that I am speaking with the correct person using two identifiers.  Location patient: Home Location provider: [facility], Office Persons participating in the virtual visit: Patient, Provider, [place] (Patina Moore)  I discussed the limitations of evaluation and management by telemedicine and the availability of in person appointments. The patient expressed understanding and agreed to proceed.  Subjective:   HPI:   Patient presents via [place] today to discuss a recurrence of her ocular migraines.  Patient with longstanding history of complicated migraine, previously followed by neurology (Dr. Leta Baptist) but has not seen in over a year.   Noted a recurrence of her headache this past Friday, with her classic symptoms including left visual disturbances followed by left temporal headache.  States this resolved Friday evening but recurred Saturday night.  States again was a typical headache for her.  Did not take anything for her symptoms.  Did note some left hand tingling which has been a prior symptom for her.  States symptoms resolved within a couple of hours.  Notes Sunday without any symptoms.  Today has noted visual aura of left eye for couple of hours that resolved.  Was not associated with a headache this time.  Denies any focal weakness.  Denies dizziness.  Denies confusion or change in speech.  Denies any symptoms of right.  Did note an episode of tingling/numbness in her right ankle that is since resolved.  Endorses continue to take her propranolol twice daily for migraine prevention.  Is wanting to discuss next steps.   ROS:   See pertinent positives and negatives per HPI.  Patient Active Problem List   Diagnosis Date Noted   Primary stabbing headache 09/11/2018   Primary osteoarthritis of both knees 06/01/2017   Burning mouth syndrome  04/13/2016   Bleeding hemorrhoid 57/26/2035   Complicated migraine 59/74/1638   Keratoma 10/19/2014   Pain in lower limb 10/19/2014   Hammer toe of left foot 10/19/2014   Morbid obesity ([place]) 09/17/2014   Snoring 03/13/2013   Left anterior knee pain 03/13/2013   Mole (skin) 09/18/2011   Migraine with aura 05/07/2011   HTN (hypertension) 04/25/2011   GERD (gastroesophageal reflux disease) 04/25/2011   Annual physical exam 04/25/2011   BREAST CANCER, HX OF 06/21/2007    Social History   Tobacco Use   Smoking status: Never Smoker   Smokeless tobacco: Never Used  Substance Use Topics   Alcohol use: Yes    Alcohol/week: 0.0 standard drinks    Comment: wine occ.    Current Outpatient Medications:    amLODipine (NORVASC) 10 MG tablet, TAKE ONE TABLET BY MOUTH DAILY, Disp: 90 tablet, Rfl: 1   Ascorbic Acid (VITAMIN C PO), Take by mouth., Disp: , Rfl:    aspirin 81 MG tablet, Take 81 mg by mouth every evening. , Disp: , Rfl:    aspirin-acetaminophen-caffeine (EXCEDRIN MIGRAINE) 250-250-65 MG tablet, Take by mouth every 6 (six) hours as needed for headache., Disp: , Rfl:    ibuprofen (ADVIL) 800 MG tablet, Take 1 tablet (800 mg total) by mouth every 8 (eight) hours as needed for headache., Disp: 30 tablet, Rfl: 0   Multiple Vitamins-Minerals (MULTIVITAMIN ADULT PO), Take 1 tablet by mouth daily. , Disp: , Rfl:    multivitamin-lutein (OCUVITE-LUTEIN) CAPS capsule, Take 1 capsule by mouth daily., Disp: , Rfl:    omeprazole (PRILOSEC) 20  MG capsule, TAKE ONE CAPSULE BY MOUTH DAILY, Disp: 90 capsule, Rfl: 0   propranolol (INDERAL) 20 MG tablet, Take 1 tablet (20 mg total) by mouth 2 (two) times daily., Disp: 60 tablet, Rfl: 6   vitamin B-12 (CYANOCOBALAMIN) 100 MCG tablet, Take 100 mcg by mouth daily., Disp: , Rfl:   Allergies  Allergen Reactions   Codeine Nausea And Vomiting    Objective:   There were no vitals taken for this visit.  Patient is  well-developed, well-nourished in no acute distress.  Resting comfortably at home.  Head is normocephalic, atraumatic.  No labored breathing.  Speech is clear and coherent with logical content.  Patient is alert and oriented at baseline.   Assessment and Plan:   1. Ocular migraine 2. Numbness of right foot History of ocular migraine with increased frequency now, also with symptoms concerning from complex migraine versus TIA. Vote more so for complex migraine giving timing of symptoms with her classic migrainous symptoms, however strict ER precautions reviewed. BP under control. Continue baby aspirin. Continue Propranolol for prevention. She does not want to see her prior neurologist. Will refer to new neurologist. Also discussed PCP follow-up.  - Ambulatory referral to Neurology    Leeanne Rio, PA-C 07/05/2020

## 2020-07-05 NOTE — Progress Notes (Signed)
I have discussed the procedure for the virtual visit with the patient who has given consent to proceed with assessment and treatment.   Ann Conley S Shataria Crist, CMA     

## 2020-07-14 ENCOUNTER — Other Ambulatory Visit: Payer: Self-pay

## 2020-07-14 ENCOUNTER — Ambulatory Visit: Payer: Medicare HMO | Admitting: Diagnostic Neuroimaging

## 2020-07-14 ENCOUNTER — Encounter: Payer: Self-pay | Admitting: Diagnostic Neuroimaging

## 2020-07-14 VITALS — BP 154/82 | HR 69 | Ht 61.0 in | Wt 215.4 lb

## 2020-07-14 DIAGNOSIS — I10 Essential (primary) hypertension: Secondary | ICD-10-CM

## 2020-07-14 DIAGNOSIS — G43109 Migraine with aura, not intractable, without status migrainosus: Secondary | ICD-10-CM

## 2020-07-14 MED ORDER — PROPRANOLOL HCL 40 MG PO TABS: 40.0000 mg | ORAL_TABLET | Freq: Two times a day (BID) | ORAL | 12 refills | Status: DC

## 2020-07-14 MED ORDER — PROPRANOLOL HCL 20 MG PO TABS: 20.0000 mg | ORAL_TABLET | Freq: Two times a day (BID) | ORAL | 12 refills | Status: DC

## 2020-07-14 NOTE — Progress Notes (Signed)
[facility]  PATIENT: Ann Conley DOB: 09-22-1934  REFERRING CLINICIAN: Alver Fisher HISTORY FROM: patient  REASON FOR VISIT: follow up   HISTORICAL  CHIEF COMPLAINT:  Chief Complaint  Patient presents with  . Migraine    rm 7     HISTORY OF PRESENT ILLNESS:   UPDATE (07/14/20, VRP): Since last visit, doing well except for some more migraine, numbness and vision issues. No alleviating or aggravating factors. Tolerating meds. Has only been taking propranolol 20mg  daily.     UPDATE (01/07/19, VRP): Since last visit, doing well. Symptoms are resolved. Severity is mild. No alleviating or aggravating factors. Tolerating meds. Having 1-2 HA per month, and controlled with Excedrin migraine.  PRIOR HPI (09/14/18): 86 year old female here for evaluation:  STABBING HEADACHES Patient has had long history since childhood of intermittent sharp stabbing headaches lasting for a few seconds, on the right side of her head.  These can occur several times per day and several times per month.  Patient looked up online and diagnosed herself with ice pick headaches.  She does not take any medications for these symptoms because they are so short lasting.  They have slightly worsened in the last few months.  Patient has been under more stress with her husband's illness and other family issues.  MIGRAINE TYPE HA Since 2016 patient has had a different type of headache which consists of partial vision loss in the left eye, followed by severe global pounding throbbing headaches associated with photophobia.  No nausea or vomiting.  Headaches can last 2 hours or longer at a time.  She diagnosed herself with "ocular migraines".  She is taken over-the-counter medications with mild relief.  These headaches have been worse than last 6 to 12 months, but have spontaneously resolved since August 2019.  LEFT ARM NUMBNESS 08/10/2018 patient noticed sudden onset of left arm numbness and weakness.  She  was having trouble controlling her left arm.  She also noticed some numbness in her left face.  Symptoms lasted for 1 hour.  Patient went to the emergency room for evaluation.  CT of the head and lab testing were unremarkable.  Blood pressure was 175/85.  Apparently "low concern for TIA" was determined and patient was encouraged to follow-up with PCP for evaluation. On 08/23/2018 patient had recurrence of symptoms, with left arm and left face numbness and tingling.  Patient called PCP who recommended ER evaluation but patient declined to go.  HYPERTENSION Patient ran out of her blood pressure medications 2 days ago.  She says that her blood pressure is "always high".  She is on amlodipine 10 mg daily.  Today blood pressure is 170/89.   REVIEW OF SYSTEMS: Full 14 system review of systems performed and negative with exception of: as per HPI.   ALLERGIES: Allergies  Allergen Reactions  . Codeine Nausea And Vomiting    HOME MEDICATIONS: Outpatient Medications Prior to Visit  Medication Sig Dispense Refill  . amLODipine (NORVASC) 10 MG tablet TAKE ONE TABLET BY MOUTH DAILY 90 tablet 1  . Ascorbic Acid (VITAMIN C PO) Take by mouth.    Marland Kitchen aspirin 81 MG tablet Take 81 mg by mouth every evening.     Marland Kitchen aspirin-acetaminophen-caffeine (EXCEDRIN MIGRAINE) 250-250-65 MG tablet Take by mouth every 6 (six) hours as needed for headache.    . ibuprofen (ADVIL) 800 MG tablet Take 1 tablet (800 mg total) by mouth every 8 (eight) hours as needed for headache. 30 tablet 0  . Magnesium  250 MG TABS Take 250 mg by mouth daily.    . Multiple Vitamins-Minerals (MULTIVITAMIN ADULT PO) Take 1 tablet by mouth daily.     . multivitamin-lutein (OCUVITE-LUTEIN) CAPS capsule Take 1 capsule by mouth daily.    Marland Kitchen omeprazole (PRILOSEC) 20 MG capsule TAKE ONE CAPSULE BY MOUTH DAILY 90 capsule 0  . propranolol (INDERAL) 20 MG tablet Take 1 tablet (20 mg total) by mouth 2 (two) times daily. 60 tablet 6  . vitamin B-12  (CYANOCOBALAMIN) 100 MCG tablet Take 100 mcg by mouth daily.     No facility-administered medications prior to visit.    PAST MEDICAL HISTORY: Past Medical History:  Diagnosis Date  . Anemia   . Blood transfusion complicating pregnancy 0263  . GERD (gastroesophageal reflux disease)   . History of chicken pox   . Hypertension   . Migraine    occular  . TIA (transient ischemic attack)     PAST SURGICAL HISTORY: Past Surgical History:  Procedure Laterality Date  . BREAST BIOPSY    . CHOLECYSTECTOMY    . PARTIAL HYSTERECTOMY      FAMILY HISTORY: Family History  Problem Relation Age of Onset  . Breast cancer Mother   . Heart disease Father   . Heart disease Sister        pacemaker  . Diabetes Sister     SOCIAL HISTORY: Social History   Socioeconomic History  . Marital status: Married    Spouse name: Not on file  . Number of children: Not on file  . Years of education: Not on file  . Highest education level: Not on file  Occupational History  . Not on file  Tobacco Use  . Smoking status: Never Smoker  . Smokeless tobacco: Never Used  Vaping Use  . Vaping Use: Never used  Substance and Sexual Activity  . Alcohol use: Yes    Alcohol/week: 0.0 standard drinks    Comment: wine occ.  . Drug use: No  . Sexual activity: Not on file  Other Topics Concern  . Not on file  Social History Narrative   Lives with husband.  5 children.  Education BA.   Social Determinants of Health   Financial Resource Strain:   . Difficulty of Paying Living Expenses: Not on file  Food Insecurity:   . Worried About Charity fundraiser in the Last Year: Not on file  . Ran Out of Food in the Last Year: Not on file  Transportation Needs:   . Lack of Transportation (Medical): Not on file  . Lack of Transportation (Non-Medical): Not on file  Physical Activity:   . Days of Exercise per Week: Not on file  . Minutes of Exercise per Session: Not on file  Stress:   . Feeling of Stress :  Not on file  Social Connections:   . Frequency of Communication with Friends and Family: Not on file  . Frequency of Social Gatherings with Friends and Family: Not on file  . Attends Religious Services: Not on file  . Active Member of Clubs or Organizations: Not on file  . Attends Archivist Meetings: Not on file  . Marital Status: Not on file  Intimate Partner Violence:   . Fear of Current or Ex-Partner: Not on file  . Emotionally Abused: Not on file  . Physically Abused: Not on file  . Sexually Abused: Not on file     PHYSICAL EXAM  GENERAL EXAM/CONSTITUTIONAL: Vitals:  Vitals:   07/14/20  1301  BP: (!) 154/82  Pulse: 69  Weight: 215 lb 6.4 oz (97.7 kg)  Height: 5\' 1"  (1.549 m)   Body mass index is 40.7 kg/m. Wt Readings from Last 3 Encounters:  07/14/20 215 lb 6.4 oz (97.7 kg)  05/11/20 212 lb (96.2 kg)  03/09/20 220 lb 4 oz (99.9 kg)    Patient is in no distress; well developed, nourished and groomed; neck is supple  CARDIOVASCULAR:  Examination of carotid arteries is normal; no carotid bruits  Regular rate and rhythm, no murmurs  Examination of peripheral vascular system by observation and palpation is normal  EYES:  Ophthalmoscopic exam of optic discs and posterior segments is normal; no papilledema or hemorrhages No exam data present  MUSCULOSKELETAL:  Gait, strength, tone, movements noted in Neurologic exam below  NEUROLOGIC: MENTAL STATUS:  MMSE - [place] Exam 09/20/2017  Orientation to time 5  Orientation to Place 5  Registration 3  Attention/ Calculation 5  Recall 2  Language- name 2 objects 2  Language- repeat 1  Language- follow 3 step command 3  Language- read & follow direction 1  Write a sentence 1  Copy design 1  Total score 29    awake, alert, oriented to person, place and time  recent and remote memory intact  normal attention and concentration  language fluent, comprehension intact, naming  intact  fund of knowledge appropriate  CRANIAL NERVE:   2nd - no papilledema on fundoscopic exam  2nd, 3rd, 4th, 6th - pupils equal and reactive to light, visual fields full to confrontation, extraocular muscles intact, no nystagmus  5th - facial sensation symmetric  7th - facial strength symmetric  8th - hearing intact  9th - palate elevates symmetrically, uvula midline  11th - shoulder shrug symmetric  12th - tongue protrusion midline  MOTOR:   normal bulk and tone, full strength in the BUE, BLE  SENSORY:   normal and symmetric to light touch, temperature, vibration  COORDINATION:   finger-nose-finger, fine finger movements normal  REFLEXES:   deep tendon reflexes present and symmetric  GAIT/STATION:   narrow based gait     DIAGNOSTIC DATA (LABS, IMAGING, TESTING) - I reviewed patient records, labs, notes, testing and imaging myself where available.  Lab Results  Component Value Date   WBC 6.2 04/15/2020   HGB 12.3 04/15/2020   HCT 37.8 04/15/2020   MCV 86.0 04/15/2020   PLT 273.0 04/15/2020      Component Value Date/Time   NA 136 03/09/2020 1104   K 4.3 03/09/2020 1104   CL 100 03/09/2020 1104   CO2 30 03/09/2020 1104   GLUCOSE 107 (H) 03/09/2020 1104   BUN 13 03/09/2020 1104   CREATININE 0.48 03/09/2020 1104   CREATININE 0.56 (L) 12/10/2015 1537   CALCIUM 8.9 03/09/2020 1104   PROT 6.6 03/09/2020 1104   ALBUMIN 3.6 03/09/2020 1104   AST 24 03/09/2020 1104   ALT 36 (H) 03/09/2020 1104   ALKPHOS 90 03/09/2020 1104   BILITOT 0.4 03/09/2020 1104   GFRNONAA >60 08/10/2018 1330   GFRAA >60 08/10/2018 1330   Lab Results  Component Value Date   CHOL 172 03/09/2020   HDL 54.20 03/09/2020   LDLCALC 104 (H) 03/09/2020   LDLDIRECT 125.0 09/09/2012   TRIG 70.0 03/09/2020   CHOLHDL 3 03/09/2020   Lab Results  Component Value Date   HGBA1C 5.7 09/12/2019   No results found for: [id number] Lab Results  Component Value Date   TSH  2.43  03/09/2020    08/09/18 CT head [I reviewed images myself and agree with interpretation. -VRP]  - no acute findings  09/20/18 MRI of the brain without contrast shows the following: 1.   Moderate chronic microvascular ischemic changes.  None of the foci appears to be acute. 2.   Age-appropriate minimal to mild generalized cortical atrophy. 3.   There are no acute findings.  09/16/18 TTE - Left ventricle: The cavity size was normal. Wall thickness was   increased in a pattern of mild LVH. Systolic function was normal.   The estimated ejection fraction was in the range of 60% to 65%.   Doppler parameters are consistent with both elevated ventricular   end-diastolic filling pressure and elevated left atrial filling   pressure. - Mitral valve: Severely calcified annulus. Moderately thickened,   mildly calcified leaflets . Valve area by continuity equation   (using LVOT flow): 3.61 cm^2. - Left atrium: The atrium was moderately dilated. - Atrial septum: No defect or patent foramen ovale was identified.  09/19/18 carotid u/s Right Carotid: Velocities in the right ICA are consistent with a 1-39% stenosis. Left Carotid: Velocities in the left ICA are consistent with a 1-39% stenosis. Vertebrals:  Bilateral vertebral arteries demonstrate antegrade flow. Subclavians: Normal flow hemodynamics were seen in bilateral subclavian arteries.    ASSESSMENT AND PLAN  86 y.o. year old female here with:  Dx:  1. Migraine with aura and without status migrainosus, not intractable   2. Essential hypertension     PLAN:  MIGRAINE WITH AURA (since 2016) - increase propranolol to 20mg  twice a day (has only been taking 20mg  daily) - use ibuprofen, tylenol or excedrin migraine as needed  TIA (left face / arm numbness; left arm weakness x 1 hour; 08/10/18) - continue aspirin 81mg  daily - consider statin to lower LDL to goal < 70 for stroke prevention; although given age and patient reluctance for new  medications, may hold off for now - continue BP control  PRIMARY STABBING HEADACHE - consider indomethacin or melatonin  Meds ordered this encounter  Medications  . DISCONTD: propranolol (INDERAL) 40 MG tablet    Sig: Take 1 tablet (40 mg total) by mouth 2 (two) times daily.    Dispense:  60 tablet    Refill:  12  . propranolol (INDERAL) 20 MG tablet    Sig: Take 1 tablet (20 mg total) by mouth 2 (two) times daily.    Dispense:  60 tablet    Refill:  12   Return in about 1 year (around 07/14/2021).     Penni Bombard, MD 06/09/6961, 9:52 PM Certified in Neurology, Neurophysiology and Neuroimaging  [facility] [address], [place], [place] [address] [phone]

## 2020-07-14 NOTE — Patient Instructions (Signed)
  MIGRAINE WITH AURA (since 2016) - increase propranolol to 20mg  twice a day (has only been taking 20mg  daily) - use ibuprofen, tylenol or excedrin migraine as needed

## 2020-07-18 IMAGING — CT CT HEAD W/O CM
3 series · 15 of 47 positions shown, 18 images · non-contrast
Comparison: 01/11/2016

CLINICAL DATA: Headache, facial numbness

EXAM:
CT HEAD WITHOUT CONTRAST
TECHNIQUE: Contiguous axial images were obtained from the base of the skull
through the vertex without intravenous contrast.

[Series 3: coronal soft tissue · coronal · 0.29mm/px · 3 of 62 slices shown]
[im 21/62  brain]
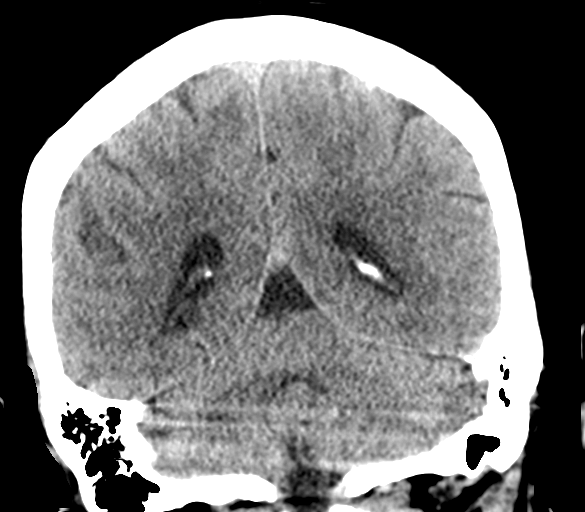
[im 28/62  brain]
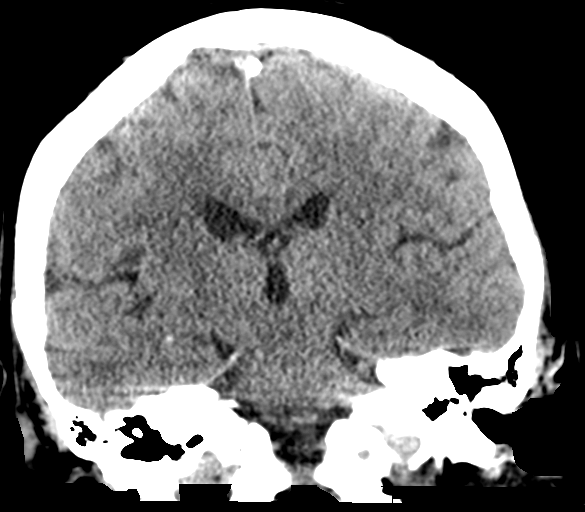
[im 34/62  brain]
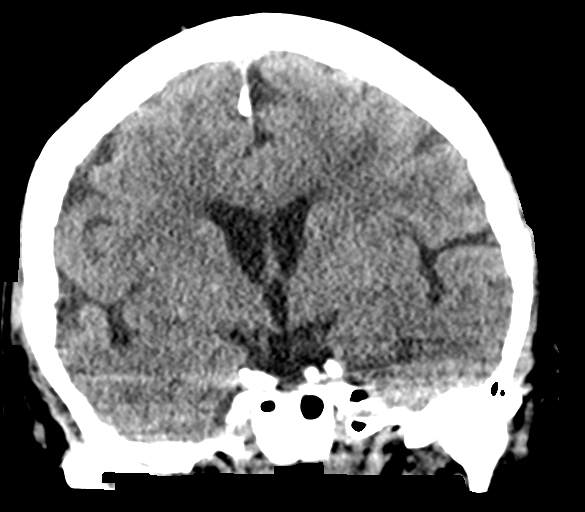

[Series 4: sagittal soft tissue · sagittal · 0.28mm/px · 3 of 55 slices shown]
[im 19/55  brain]
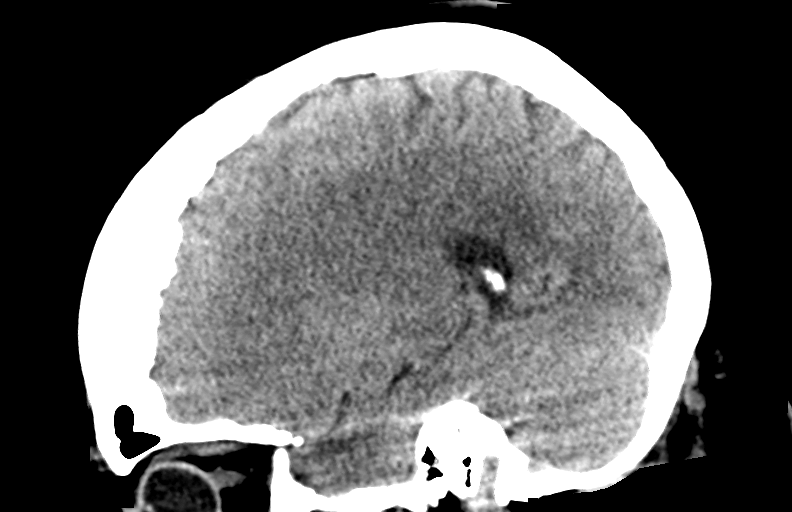
[im 28/55  brain]
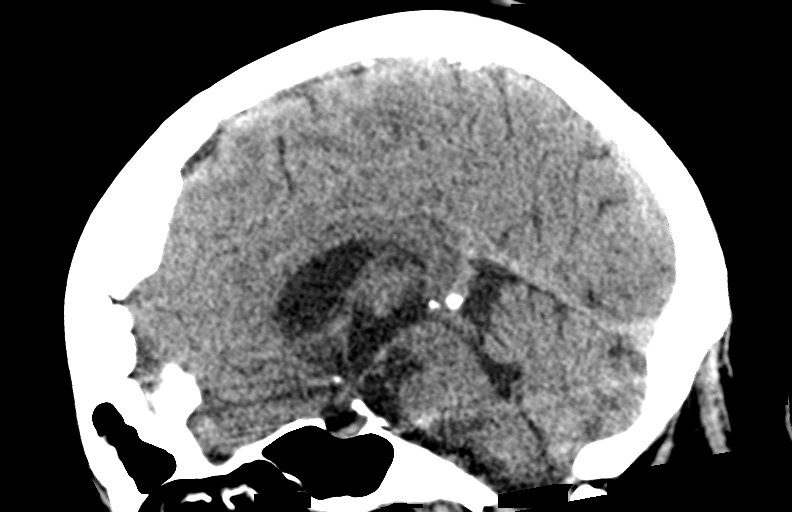
[im 37/55  brain]
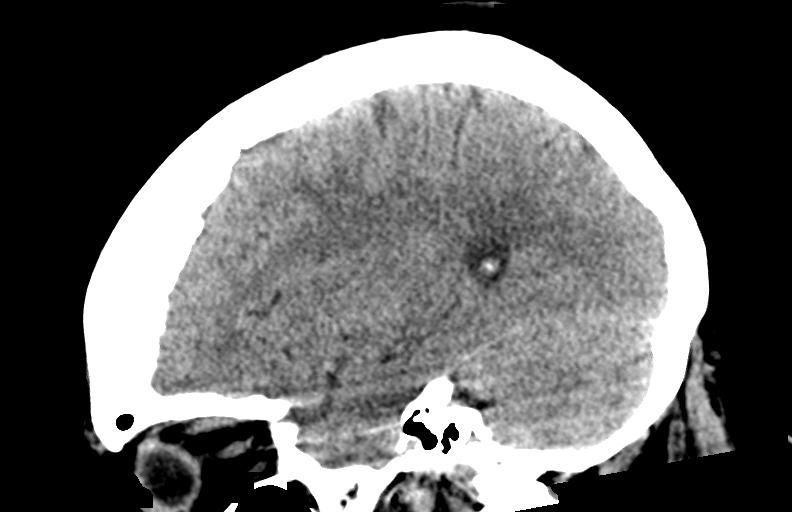

[Series 6: head wo · axial · 0.47mm/px · z∈[-82,+43]mm · 9 of 30 slices shown, 12 images]
[im 3/30  brain]
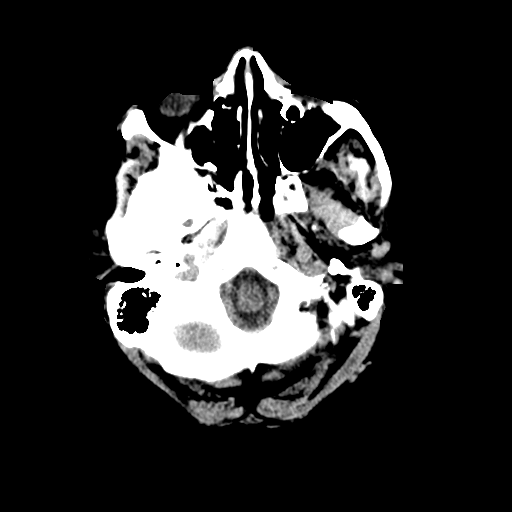
[im 3/30  bone]
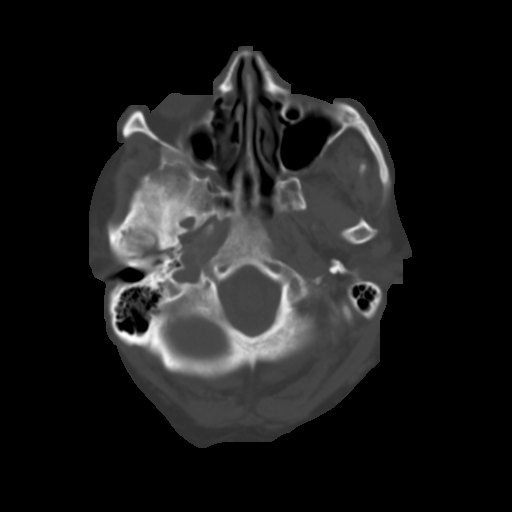
[im 6/30  brain]
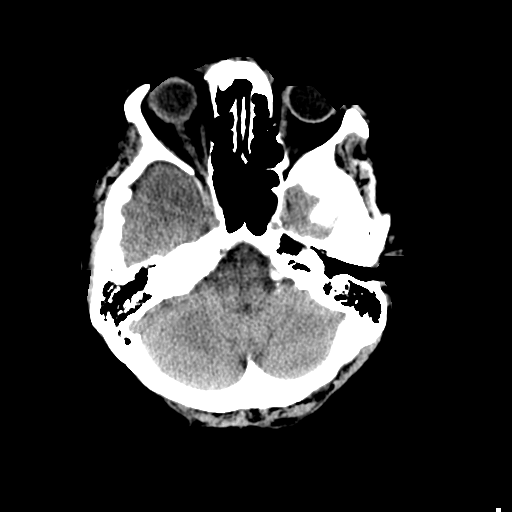
[im 9/30  brain]
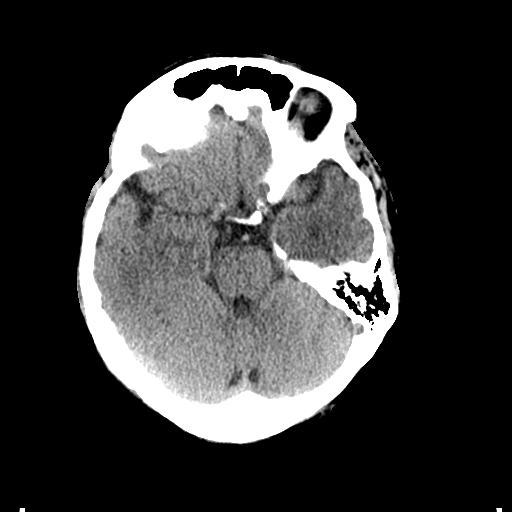
[im 12/30  brain]
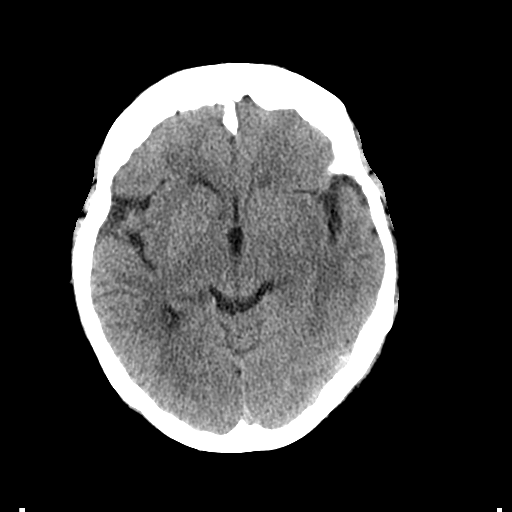
[im 16/30  brain]
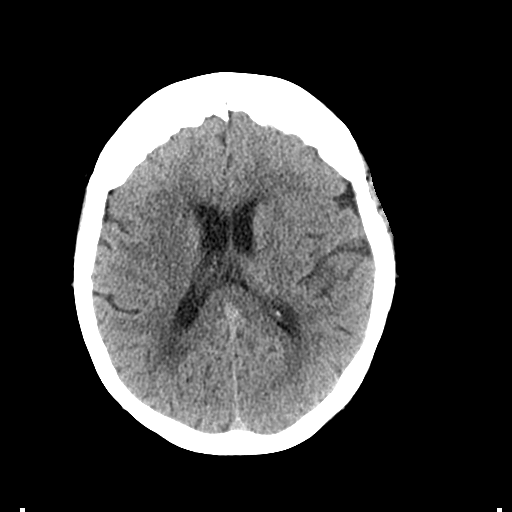
[im 16/30  bone]
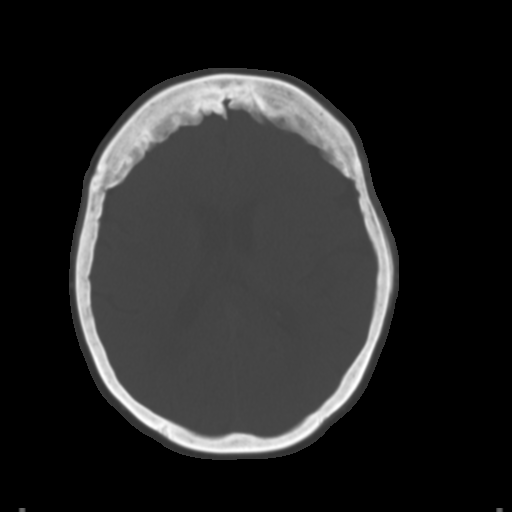
[im 19/30  brain]
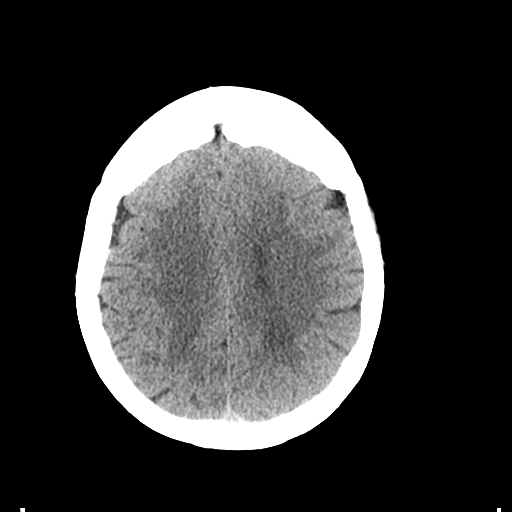
[im 22/30  brain]
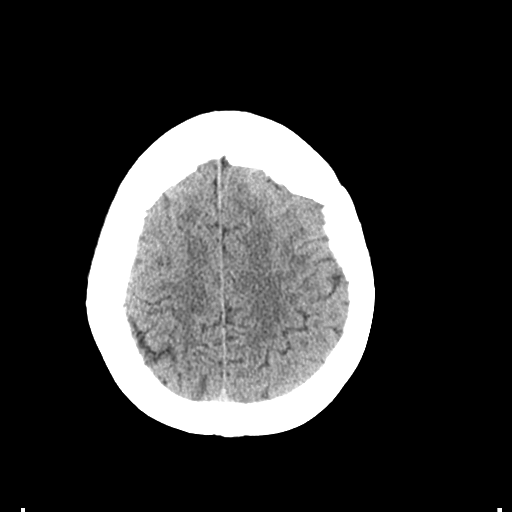
[im 25/30  brain]
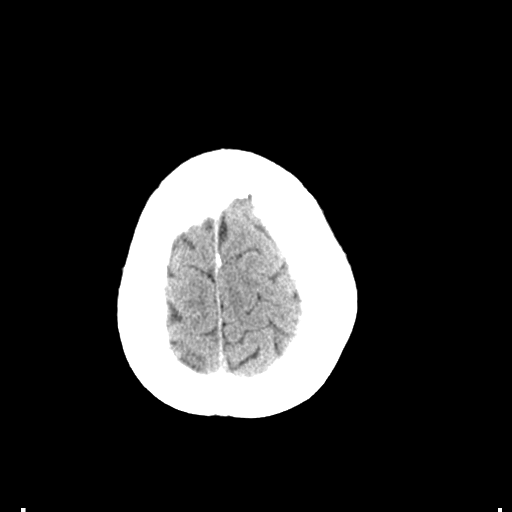
[im 28/30  brain]
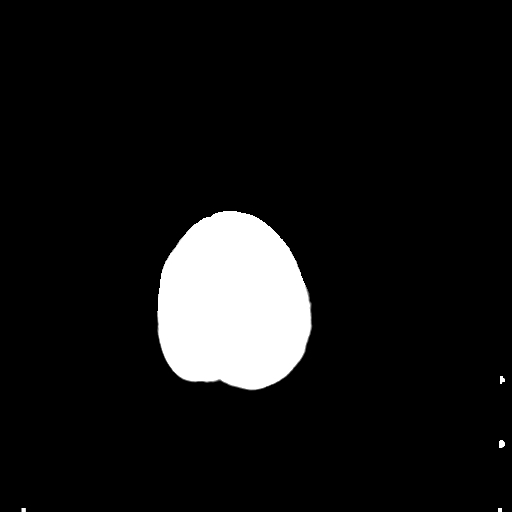
[im 28/30  bone]
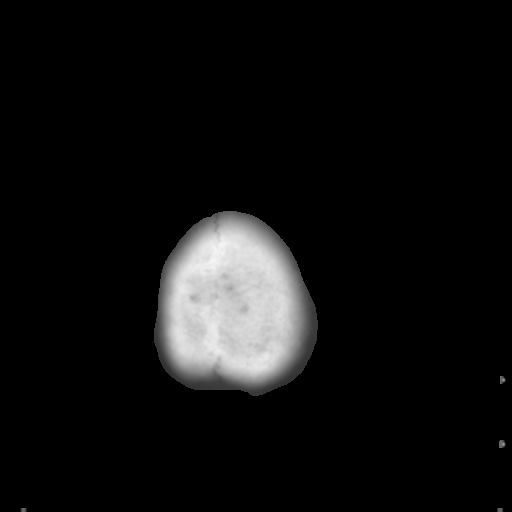

[15 of 47 positions shown; findings below may reference images not displayed]

FINDINGS: Brain: There is atrophy and chronic small vessel disease changes. No
acute intracranial abnormality. Specifically, no hemorrhage,
hydrocephalus, mass lesion, acute infarction, or significant
intracranial injury.

Vascular: No hyperdense vessel or unexpected calcification.

Skull: No acute calvarial abnormality.

Sinuses/Orbits: Visualized paranasal sinuses and mastoids clear.
Orbital soft tissues unremarkable.

Other: None
IMPRESSION: No acute intracranial abnormality.

Atrophy, chronic microvascular disease.

## 2020-07-26 ENCOUNTER — Other Ambulatory Visit: Payer: Self-pay | Admitting: General Practice

## 2020-07-26 MED ORDER — AMLODIPINE BESYLATE 10 MG PO TABS
10.0000 mg | ORAL_TABLET | Freq: Every day | ORAL | 1 refills | Status: DC
Start: 2020-07-26 — End: 2021-02-01

## 2020-07-26 MED ORDER — OMEPRAZOLE 20 MG PO CPDR: 20.0000 mg | DELAYED_RELEASE_CAPSULE | Freq: Every day | ORAL | 0 refills | Status: DC

## 2020-08-11 ENCOUNTER — Encounter (HOSPITAL_BASED_OUTPATIENT_CLINIC_OR_DEPARTMENT_OTHER): Payer: Self-pay | Admitting: Emergency Medicine

## 2020-08-11 ENCOUNTER — Emergency Department (HOSPITAL_BASED_OUTPATIENT_CLINIC_OR_DEPARTMENT_OTHER)
Admission: EM | Admit: 2020-08-11 | Discharge: 2020-08-11 | Disposition: A | Discharge status: Home / Self Care | Payer: Medicare HMO | Attending: Emergency Medicine | Admitting: Emergency Medicine

## 2020-08-11 ENCOUNTER — Other Ambulatory Visit: Payer: Self-pay

## 2020-08-11 ENCOUNTER — Emergency Department (HOSPITAL_BASED_OUTPATIENT_CLINIC_OR_DEPARTMENT_OTHER): Payer: Medicare HMO

## 2020-08-11 DIAGNOSIS — R200 Anesthesia of skin: Secondary | ICD-10-CM | POA: Diagnosis not present

## 2020-08-11 DIAGNOSIS — Z853 Personal history of malignant neoplasm of breast: Secondary | ICD-10-CM | POA: Insufficient documentation

## 2020-08-11 DIAGNOSIS — R221 Localized swelling, mass and lump, neck: Secondary | ICD-10-CM | POA: Insufficient documentation

## 2020-08-11 DIAGNOSIS — G43809 Other migraine, not intractable, without status migrainosus: Secondary | ICD-10-CM | POA: Insufficient documentation

## 2020-08-11 DIAGNOSIS — Z7982 Long term (current) use of aspirin: Secondary | ICD-10-CM | POA: Diagnosis not present

## 2020-08-11 DIAGNOSIS — I10 Essential (primary) hypertension: Secondary | ICD-10-CM | POA: Insufficient documentation

## 2020-08-11 DIAGNOSIS — G43109 Migraine with aura, not intractable, without status migrainosus: Secondary | ICD-10-CM | POA: Diagnosis not present

## 2020-08-11 DIAGNOSIS — Z79899 Other long term (current) drug therapy: Secondary | ICD-10-CM | POA: Insufficient documentation

## 2020-08-11 DIAGNOSIS — G319 Degenerative disease of nervous system, unspecified: Secondary | ICD-10-CM | POA: Diagnosis not present

## 2020-08-11 DIAGNOSIS — I6782 Cerebral ischemia: Secondary | ICD-10-CM | POA: Diagnosis not present

## 2020-08-11 LAB — BASIC METABOLIC PANEL
Anion gap: 11 (ref 5–15)
BUN: 17 mg/dL (ref 8–23)
CO2: 25 mmol/L (ref 22–32)
Calcium: 9.3 mg/dL (ref 8.9–10.3)
Chloride: 99 mmol/L (ref 98–111)
Creatinine, Ser: 0.46 mg/dL (ref 0.44–1.00)
GFR calc non Af Amer: >60 mL/min (ref >60)
Glucose, Bld: 103 mg/dL — ABNORMAL HIGH (ref 70–99)
Potassium: 4.2 mmol/L (ref 3.5–5.1)
Sodium: 135 mmol/L (ref 135–145)

## 2020-08-11 LAB — CBC
HCT: 39.6 % (ref 36.0–46.0)
Hemoglobin: 12.7 g/dL (ref 12.0–15.0)
MCH: 28.3 pg (ref 26.0–34.0)
MCHC: 32.1 g/dL (ref 30.0–36.0)
MCV: 88.2 fL (ref 80.0–100.0)
Platelets: 290 10*3/uL (ref 150–400)
RBC: 4.49 MIL/uL (ref 3.87–5.11)
RDW: 14.7 % (ref 11.5–15.5)
WBC: 8.6 10*3/uL (ref 4.0–10.5)
nRBC: 0 % (ref 0.0–0.2)

## 2020-08-11 LAB — TROPONIN I (HIGH SENSITIVITY): Troponin I (High Sensitivity): 7 ng/L (ref <18)

## 2020-08-11 NOTE — ED Notes (Signed)
Patient transported to CT 

## 2020-08-11 NOTE — ED Triage Notes (Signed)
Pt states she started to have numbness in left finger, radiated up her arm to her shoulder today.  It has now stopped but she relates that she can feel the vessels in her neck.  She called her doctor and was told to come to the ER to have her neck checked out.

## 2020-08-11 NOTE — ED Provider Notes (Signed)
[facility] EMERGENCY DEPARTMENT Provider Note   CSN: [id number] Arrival date & time: 08/11/20  1646     History Chief Complaint  Patient presents with  . Neck Fullness  . Numbness    Ann Conley is a 86 y.o. female.  Patient with history of ocular migraines and TIA. She had a sudden onset of migraine earlier today that resolved with her abortive medication. She also endorses transient numbness of the left upper extremity that resolved spontaneously. She also states that she felt like her neck vessels have been "swollen".   The history is provided by the patient and medical records. No language interpreter was used.  Migraine This is a recurrent problem. The current episode started 6 to 12 hours ago. The problem has been resolved. Associated symptoms include headaches. Pertinent negatives include no chest pain and no shortness of breath.       Past Medical History:  Diagnosis Date  . Anemia   . Blood transfusion complicating pregnancy 1308  . GERD (gastroesophageal reflux disease)   . History of chicken pox   . Hypertension   . Migraine    occular  . TIA (transient ischemic attack)     Patient Active Problem List   Diagnosis Date Noted  . 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 Primary osteoarthritis of both knees 06/01/2017  . 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 Bleeding hemorrhoid 04/13/2016  . Complicated migraine 65/78/4696  . Keratoma 10/19/2014  . Pain in lower limb 10/19/2014  . Hammer toe of left foot 10/19/2014  . Morbid obesity ([place]) 09/17/2014  . Snoring 03/13/2013  . Left anterior knee pain 03/13/2013  . Mole (skin) 09/18/2011  . Migraine with aura 05/07/2011  . HTN (hypertension) 04/25/2011  . GERD (gastroesophageal reflux disease) 04/25/2011  . Annual physical exam 04/25/2011  . BREAST CANCER, HX OF 06/21/2007    Past Surgical History:  Procedure Laterality Date  . BREAST BIOPSY    . CHOLECYSTECTOMY    . PARTIAL  HYSTERECTOMY       OB History   No obstetric history on file.     Family History  Problem Relation Age of Onset  . Breast cancer Mother   . Heart disease Father   . Heart disease Sister        pacemaker  . Diabetes Sister     Social History   Tobacco Use  . Smoking status: Never Smoker  . Smokeless tobacco: Never Used  Vaping Use  . Vaping Use: Never used  Substance Use Topics  . Alcohol use: Yes    Alcohol/week: 0.0 standard drinks    Comment: wine occ.  . Drug use: No    Home Medications Prior to Admission medications   Medication Sig Start Date End Date Taking? Authorizing Provider  amLODipine (NORVASC) 10 MG tablet Take 1 tablet (10 mg total) by mouth daily. 07/26/20   Midge Minium, MD  Ascorbic Acid (VITAMIN C PO) Take by mouth.    [provider]  aspirin 81 MG tablet Take 81 mg by mouth every evening.     [provider]  aspirin-acetaminophen-caffeine (EXCEDRIN MIGRAINE) [phone] MG tablet Take by mouth every 6 (six) hours as needed for headache.    [provider]  ibuprofen (ADVIL) 800 MG tablet Take 1 tablet (800 mg total) by mouth every 8 (eight) hours as needed for headache. 05/11/20   Brunetta Jeans, PA-C  Magnesium 250 MG TABS Take 250 mg by mouth daily.  [provider]  Multiple Vitamins-Minerals (MULTIVITAMIN ADULT PO) Take 1 tablet by mouth daily.     [provider]  multivitamin-lutein (OCUVITE-LUTEIN) CAPS capsule Take 1 capsule by mouth daily.    [provider]  omeprazole (PRILOSEC) 20 MG capsule Take 1 capsule (20 mg total) by mouth daily. 07/26/20   Midge Minium, MD  propranolol (INDERAL) 20 MG tablet Take 1 tablet (20 mg total) by mouth 2 (two) times daily. 07/14/20   Penumalli, Earlean Polka, MD  vitamin B-12 (CYANOCOBALAMIN) 100 MCG tablet Take 100 mcg by mouth daily.    [provider]    Allergies    Codeine  Review of Systems   Review of Systems  Respiratory:  Negative for shortness of breath.   Cardiovascular: Negative for chest pain.  Neurological: Positive for numbness and headaches.  All other systems reviewed and are negative.   Physical Exam Updated Vital Signs BP (!) 173/72 (BP Location: Right Arm)   Pulse 72   Temp 98.8 F (37.1 C) (Oral)   Resp 20   Ht 5' (1.524 m)   Wt 101.9 kg   SpO2 96%   BMI 43.86 kg/m   Physical Exam Constitutional:      Appearance: She is not ill-appearing.  HENT:     Head: Normocephalic and atraumatic.     Nose: Nose normal.  Eyes:     Extraocular Movements: Extraocular movements intact.  Neck:     Vascular: No carotid bruit.  Cardiovascular:     Rate and Rhythm: Normal rate and regular rhythm.  Pulmonary:     Effort: Pulmonary effort is normal.     Breath sounds: Normal breath sounds.  Abdominal:     Palpations: Abdomen is soft.  Musculoskeletal:        General: Normal range of motion.     Cervical back: Normal range of motion and neck supple.  Skin:    General: Skin is warm and dry.  Neurological:     General: No focal deficit present.     Mental Status: She is alert and oriented to person, place, and time.     Cranial Nerves: No cranial nerve deficit.     Sensory: No sensory deficit.     Motor: No weakness.     Coordination: Coordination normal.  Psychiatric:        Mood and Affect: Mood normal.        Behavior: Behavior normal.     ED Results / Procedures / Treatments   Labs (all labs ordered are listed, but only abnormal results are displayed) Labs Reviewed  BASIC METABOLIC PANEL - Abnormal; Notable for the following components:      Result Value   Glucose, Bld 103 (*)    All other components within normal limits  CBC  TROPONIN I (HIGH SENSITIVITY)  TROPONIN I (HIGH SENSITIVITY)    EKG EKG Interpretation  Date/Time:  Wednesday August 11 2020 17:01:10 EDT Ventricular Rate:  68 PR Interval:  200 QRS Duration: 100 QT Interval:  410 QTC Calculation: 435 R  Axis:   -21 Text Interpretation: Normal sinus rhythm Low voltage QRS Cannot rule out Anterior infarct , age undetermined Abnormal ECG No significant change since 08/10/2018 Confirmed by Veryl Speak [phone]) on 08/11/2020 6:23:05 PM   Radiology DG Chest 2 View  Result Date: 08/11/2020 CLINICAL DATA:  Left arm numbness EXAM: CHEST - 2 VIEW COMPARISON:  None. FINDINGS: The heart size and mediastinal contours are within normal limits. Both lungs  are clear. The visualized skeletal structures are unremarkable. IMPRESSION: No active cardiopulmonary disease. Electronically Signed   By: Inez Catalina M.D.   On: 08/11/2020 17:30   CT Head Wo Contrast  Result Date: 08/11/2020 CLINICAL DATA:  Left-sided arm numbness, initial encounter EXAM: CT HEAD WITHOUT CONTRAST TECHNIQUE: Contiguous axial images were obtained from the base of the skull through the vertex without intravenous contrast. COMPARISON:  08/10/2018 FINDINGS: Brain: Mild atrophic changes and chronic white matter ischemic change is seen. No findings to suggest acute hemorrhage, acute infarction or space-occupying mass lesion are noted. Vascular: No hyperdense vessel or unexpected calcification. Skull: Normal. Negative for fracture or focal lesion. Sinuses/Orbits: No acute finding. Other: None. IMPRESSION: Chronic atrophic and ischemic changes without acute abnormality. Electronically Signed   By: Inez Catalina M.D.   On: 08/11/2020 17:29    Procedures Procedures (including critical care time)  Medications Ordered in ED Medications - No data to display  ED Course  I have reviewed the triage vital signs and the nursing notes.  Pertinent labs & imaging results that were available during my care of the patient were reviewed by me and considered in my medical decision making (see chart for details).    MDM Rules/Calculators/A&P                         Patient discussed with Dr. Stark Jock.   No appreciable JVD or neck swelling on exam. No carotid  bruits.   Patient with ocular headache that resolved with abortive medication prior to arrival..  Presentation today associated with transient left arm numbness. Head CT without acute findings. Possibly complicated migraine. No current concern for [facility], ICH, Meningitis, or temporal arteritis. Pt is afebrile with no focal neuro deficits, nuchal rigidity, or change in vision. Pt is to follow up with PCP to discuss prophylactic medication. Pt verbalizes understanding and is agreeable with plan to dc.  Final Clinical Impression(s) / ED Diagnoses Final diagnoses:  Complicated migraine    Rx / DC Orders ED Discharge Orders    None       Etta Quill, NP 08/11/20 2359    Veryl Speak, MD 08/16/20 0710

## 2020-08-11 NOTE — Discharge Instructions (Signed)
Your exam and radiology findings today are reassuring. Please follow-up with your primary care provider and neurologist. Refer to attached instructions.

## 2020-09-02 ENCOUNTER — Telehealth: Payer: Self-pay | Admitting: Family Medicine

## 2020-09-02 NOTE — Telephone Encounter (Signed)
Left message for patient to schedule Annual Wellness Visit.  Please schedule with Nurse Health Advisor Martha Stanley, RN at [facility]  

## 2020-09-06 ENCOUNTER — Telehealth: Payer: Self-pay | Admitting: Family Medicine

## 2020-09-06 NOTE — Telephone Encounter (Signed)
Pt called Access Nurse line at 7:12am, she stated to them that she has a couple of TIAs on Saturday and wanted to schedule an appt.   Pt called the office asking to schedule an appt. With Dr. Birdie Riddle,  I told her that Dr. Birdie Riddle was out of the office today then asked what she wanted to be seen for. Once she told me TIA  I told her that we would need to send her to the nurse triage line and she said that she wasn't going to do that. She wanted to see her doctor or Einar Pheasant to talk about this. I asked Einar Pheasant what his recommendations were he told me  to have her triaged and she needs to call her neurologist to get an appt with their office to be evaluated. I provided the pt that information she refused to be triaged and hung up on me.

## 2020-09-07 ENCOUNTER — Ambulatory Visit: Payer: Medicare HMO | Admitting: Family Medicine

## 2020-09-07 ENCOUNTER — Encounter: Payer: Self-pay | Admitting: Family Medicine

## 2020-09-07 VITALS — BP 164/77 | HR 92 | Ht 60.0 in | Wt 216.0 lb

## 2020-09-07 DIAGNOSIS — I10 Essential (primary) hypertension: Secondary | ICD-10-CM | POA: Diagnosis not present

## 2020-09-07 DIAGNOSIS — G43109 Migraine with aura, not intractable, without status migrainosus: Secondary | ICD-10-CM | POA: Diagnosis not present

## 2020-09-07 NOTE — Patient Instructions (Addendum)
Below is our plan:  We will increase propranolol to 20mg  twice daily as directed by Dr Leta Baptist. This will help decrease frequency and intensity of migraines. Please take 1 tablet in the morning and 1 tablet in the evening. Use Excedrin as needed for abortive therapy. Try to take medication when you feel headache starting.   Please make sure you are staying well hydrated. I recommend 50-60 ounces daily. Well balanced diet and regular exercise encouraged.    Please continue follow up with care team as directed. Please keep a close eye on your blood pressure.   Follow up in 6 months   You may receive a survey regarding today's visit. I encourage you to leave honest feed back as I do use this information to improve patient care. Thank you for seeing me today!      Propranolol Tablets What is this medicine? PROPRANOLOL (proe PRAN oh lole) is a beta blocker. It decreases the amount of work your heart has to do and helps your heart beat regularly. It treats high blood pressure and/or prevent chest pain (also called angina). It is also used after a heart attack to prevent a second one. This medicine may be used for other purposes; ask your health care provider or pharmacist if you have questions. COMMON BRAND NAME(S): Inderal What should I tell my health care provider before I take this medicine? They need to know if you have any of these conditions:  circulation problems or blood vessel disease  diabetes  history of heart attack or heart disease, vasospastic angina  kidney disease  liver disease  lung or breathing disease, like asthma or emphysema  pheochromocytoma  slow heart rate  thyroid disease  an unusual or allergic reaction to propranolol, other beta-blockers, medicines, foods, dyes, or preservatives  pregnant or trying to get pregnant  breast-feeding How should I use this medicine? Take this drug by mouth. Take it as directed on the prescription label at the same  time every day. Keep taking it unless your health care provider tells you to stop. Talk to your health care provider about the use of this drug in children. Special care may be needed. Overdosage: If you think you have taken too much of this medicine contact a poison control center or emergency room at once. NOTE: This medicine is only for you. Do not share this medicine with others. What if I miss a dose? If you miss a dose, take it as soon as you can. If it is almost time for your next dose, take only that dose. Do not take double or extra doses. What may interact with this medicine? Do not take this medicine with any of the following medications:  feverfew  phenothiazines like chlorpromazine, mesoridazine, prochlorperazine, thioridazine This medicine may also interact with the following medications:  aluminum hydroxide gel  antipyrine  antiviral medicines for HIV or AIDS  barbiturates like phenobarbital  certain medicines for blood pressure, heart disease, irregular heart beat  cimetidine  ciprofloxacin  diazepam  fluconazole  haloperidol  isoniazid  medicines for cholesterol like cholestyramine or colestipol  medicines for mental depression  medicines for migraine headache like almotriptan, eletriptan, frovatriptan, naratriptan, rizatriptan, sumatriptan, zolmitriptan  NSAIDs, medicines for pain and inflammation, like ibuprofen or naproxen  phenytoin  rifampin  teniposide  theophylline  thyroid medicines  tolbutamide  warfarin  zileuton This list may not describe all possible interactions. Give your health care provider a list of all the medicines, herbs, non-prescription drugs, or dietary  supplements you use. Also tell them if you smoke, drink alcohol, or use illegal drugs. Some items may interact with your medicine. What should I watch for while using this medicine? Visit your doctor or health care professional for regular check ups. Check your blood  pressure and pulse rate regularly. Ask your health care professional what your blood pressure and pulse rate should be, and when you should contact them. You may get drowsy or dizzy. Do not drive, use machinery, or do anything that needs mental alertness until you know how this drug affects you. Do not stand or sit up quickly, especially if you are an older patient. This reduces the risk of dizzy or fainting spells. Alcohol can make you more drowsy and dizzy. Avoid alcoholic drinks. This medicine may increase blood sugar. Ask your healthcare provider if changes in diet or medicines are needed if you have diabetes. Do not treat yourself for coughs, colds, or pain while you are taking this medicine without asking your doctor or health care professional for advice. Some ingredients may increase your blood pressure. What side effects may I notice from receiving this medicine? Side effects that you should report to your doctor or health care professional as soon as possible:  allergic reactions like skin rash, itching or hives, swelling of the face, lips, or tongue  breathing problems  cold hands or feet  difficulty sleeping, nightmares  dry peeling skin  hallucinations  muscle cramps or weakness   signs and symptoms of high blood sugar such as being more thirsty or hungry or having to urinate more than normal. You may also feel very tired or have blurry vision.  slow heart rate  swelling of the legs and ankles  vomiting Side effects that usually do not require medical attention (report to your doctor or health care professional if they continue or are bothersome):  change in sex drive or performance  diarrhea  dry sore eyes  hair loss  nausea  weak or tired This list may not describe all possible side effects. Call your doctor for medical advice about side effects. You may report side effects to FDA at 1-800-FDA-1088. Where should I keep my medicine? Keep out of the reach of  children and pets. Store at room temperature between 20 and 25 degrees C (68 and 77 degrees F). Protect from light. Throw away any unused drug after the expiration date. NOTE: This sheet is a summary. It may not cover all possible information. If you have questions about this medicine, talk to your doctor, pharmacist, or health care provider.  2020 Elsevier/Gold Standard (2019-05-30 19:25:51)    Migraine Headache A migraine headache is a very strong throbbing pain on one side or both sides of your head. This type of headache can also cause other symptoms. It can last from 4 hours to 3 days. Talk with your doctor about what things may bring on (trigger) this condition. What are the causes? The exact cause of this condition is not known. This condition may be triggered or caused by:  Drinking alcohol.  Smoking.  Taking medicines, such as: ? Medicine used to treat chest pain (nitroglycerin). ? Birth control pills. ? Estrogen. ? Some blood pressure medicines.  Eating or drinking certain products.  Doing physical activity. Other things that may trigger a migraine headache include:  Having a menstrual period.  Pregnancy.  Hunger.  Stress.  Not getting enough sleep or getting too much sleep.  Weather changes.  Tiredness (fatigue). What increases the  risk?  Being 27-83 years old.  Being female.  Having a family history of migraine headaches.  Being Caucasian.  Having depression or anxiety.  Being very overweight. What are the signs or symptoms?  A throbbing pain. This pain may: ? Happen in any area of the head, such as on one side or both sides. ? Make it hard to do daily activities. ? Get worse with physical activity. ? Get worse around bright lights or loud noises.  Other symptoms may include: ? Feeling sick to your stomach (nauseous). ? Vomiting. ? Dizziness. ? Being sensitive to bright lights, loud noises, or smells.  Before you get a migraine headache,  you may get warning signs (an aura). An aura may include: ? Seeing flashing lights or having blind spots. ? Seeing bright spots, halos, or zigzag lines. ? Having tunnel vision or blurred vision. ? Having numbness or a tingling feeling. ? Having trouble talking. ? Having weak muscles.  Some people have symptoms after a migraine headache (postdromal phase), such as: ? Tiredness. ? Trouble thinking (concentrating). How is this treated?  Taking medicines that: ? Relieve pain. ? Relieve the feeling of being sick to your stomach. ? Prevent migraine headaches.  Treatment may also include: ? Having acupuncture. ? Avoiding foods that bring on migraine headaches. ? Learning ways to control your body functions (biofeedback). ? Therapy to help you know and deal with negative thoughts (cognitive behavioral therapy). Follow these instructions at home: Medicines  Take over-the-counter and prescription medicines only as told by your doctor.  Ask your doctor if the medicine prescribed to you: ? Requires you to avoid driving or using heavy machinery. ? Can cause trouble pooping (constipation). You may need to take these steps to prevent or treat trouble pooping:  Drink enough fluid to keep your pee (urine) pale yellow.  Take over-the-counter or prescription medicines.  Eat foods that are high in fiber. These include beans, whole grains, and fresh fruits and vegetables.  Limit foods that are high in fat and sugar. These include fried or sweet foods. Lifestyle  Do not drink alcohol.  Do not use any products that contain nicotine or tobacco, such as cigarettes, e-cigarettes, and chewing tobacco. If you need help quitting, ask your doctor.  Get at least 8 hours of sleep every night.  Limit and deal with stress. General instructions      Keep a journal to find out what may bring on your migraine headaches. For example, write down: ? What you eat and drink. ? How much sleep you  get. ? Any change in what you eat or drink. ? Any change in your medicines.  If you have a migraine headache: ? Avoid things that make your symptoms worse, such as bright lights. ? It may help to lie down in a dark, quiet room. ? Do not drive or use heavy machinery. ? Ask your doctor what activities are safe for you.  Keep all follow-up visits as told by your doctor. This is important. Contact a doctor if:  You get a migraine headache that is different or worse than others you have had.  You have more than 15 headache days in one month. Get help right away if:  Your migraine headache gets very bad.  Your migraine headache lasts longer than 72 hours.  You have a fever.  You have a stiff neck.  You have trouble seeing.  Your muscles feel weak or like you cannot control them.  You start to  lose your balance a lot.  You start to have trouble walking.  You pass out (faint).  You have a seizure. Summary  A migraine headache is a very strong throbbing pain on one side or both sides of your head. These headaches can also cause other symptoms.  This condition may be treated with medicines and changes to your lifestyle.  Keep a journal to find out what may bring on your migraine headaches.  Contact a doctor if you get a migraine headache that is different or worse than others you have had.  Contact your doctor if you have more than 15 headache days in a month. This information is not intended to replace advice given to you by your health care provider. Make sure you discuss any questions you have with your health care provider. Document Revised: 02/14/2019 Document Reviewed: 12/05/2018 Elsevier Patient Education  [place].

## 2020-09-07 NOTE — Progress Notes (Signed)
Chief Complaint  Patient presents with  . Follow-up    tx rm  . Migraine    Pt has no new sx.     HISTORY OF PRESENT ILLNESS: Today 09/08/20  Ann Conley is a 86 y.o. female here today for follow up for complicated migraines. She has history of partial vision loss with left arm numbness associated with headache. She was last seen 07/14/2020 by Dr Leta Baptist and advised to increase propranolol to 20mg  BID. She has not increased dose. She has had two migraines since being seen. She was evaluated by the ER on 10/6. CT brain normal. She reports symptoms resolved spontaneously without abortive therapy. She usually takes Excedrin but states that she rarely needs to take it. She reports BP is "always high". Reports it was 144/63 at home yesterday.   HISTORY (copied from Dr Gladstone Lighter note on 07/14/2020)  UPDATE (07/14/20, VRP): Since last visit, doing well except for some more migraine, numbness and vision issues. No alleviating or aggravating factors. Tolerating meds. Has only been taking propranolol 20mg  daily.     UPDATE (01/07/19, VRP): Since last visit, doing well. Symptoms are resolved. Severity is mild. No alleviating or aggravating factors. Tolerating meds. Having 1-2 HA per month, and controlled with Excedrin migraine.  PRIOR HPI (09/14/18): 86 year old female here for evaluation:  STABBING HEADACHES Patient has had long history since childhood of intermittent sharp stabbing headaches lasting for a few seconds, on the right side of her head.  These can occur several times per day and several times per month.  Patient looked up online and diagnosed herself with ice pick headaches.  She does not take any medications for these symptoms because they are so short lasting.  They have slightly worsened in the last few months.  Patient has been under more stress with her husband's illness and other family issues.  MIGRAINE TYPE HA Since 2016 patient has had a different type of headache  which consists of partial vision loss in the left eye, followed by severe global pounding throbbing headaches associated with photophobia.  No nausea or vomiting.  Headaches can last 2 hours or longer at a time.  She diagnosed herself with "ocular migraines".  She is taken over-the-counter medications with mild relief.  These headaches have been worse than last 6 to 12 months, but have spontaneously resolved since August 2019.  LEFT ARM NUMBNESS 08/10/2018 patient noticed sudden onset of left arm numbness and weakness.  She was having trouble controlling her left arm.  She also noticed some numbness in her left face.  Symptoms lasted for 1 hour.  Patient went to the emergency room for evaluation.  CT of the head and lab testing were unremarkable.  Blood pressure was 175/85.  Apparently "low concern for TIA" was determined and patient was encouraged to follow-up with PCP for evaluation. On 08/23/2018 patient had recurrence of symptoms, with left arm and left face numbness and tingling.  Patient called PCP who recommended ER evaluation but patient declined to go.  HYPERTENSION Patient ran out of her blood pressure medications 2 days ago.  She says that her blood pressure is "always high".  She is on amlodipine 10 mg daily.  Today blood pressure is 170/89.     REVIEW OF SYSTEMS: Out of a complete 14 system review of symptoms, the patient complains only of the following symptoms, headaches and all other reviewed systems are negative.    ALLERGIES: Allergies  Allergen Reactions  . Codeine Nausea And  Vomiting     HOME MEDICATIONS: Outpatient Medications Prior to Visit  Medication Sig Dispense Refill  . amLODipine (NORVASC) 10 MG tablet Take 1 tablet (10 mg total) by mouth daily. (Patient taking differently: Take 10 mg by mouth daily. Taking 2) 90 tablet 1  . Ascorbic Acid (VITAMIN C PO) Take by mouth.    Marland Kitchen aspirin 81 MG tablet Take 81 mg by mouth every evening.     Marland Kitchen  aspirin-acetaminophen-caffeine (EXCEDRIN MIGRAINE) 250-250-65 MG tablet Take by mouth every 6 (six) hours as needed for headache.    . ibuprofen (ADVIL) 800 MG tablet Take 1 tablet (800 mg total) by mouth every 8 (eight) hours as needed for headache. 30 tablet 0  . Magnesium 250 MG TABS Take 250 mg by mouth daily.    . Multiple Vitamins-Minerals (MULTIVITAMIN ADULT PO) Take 1 tablet by mouth daily.     . multivitamin-lutein (OCUVITE-LUTEIN) CAPS capsule Take 1 capsule by mouth daily.    Marland Kitchen omeprazole (PRILOSEC) 20 MG capsule Take 1 capsule (20 mg total) by mouth daily. 90 capsule 0  . propranolol (INDERAL) 20 MG tablet Take 1 tablet (20 mg total) by mouth 2 (two) times daily. 60 tablet 12  . vitamin B-12 (CYANOCOBALAMIN) 100 MCG tablet Take 100 mcg by mouth daily.     No facility-administered medications prior to visit.     PAST MEDICAL HISTORY: Past Medical History:  Diagnosis Date  . Anemia   . Blood transfusion complicating pregnancy 5277  . GERD (gastroesophageal reflux disease)   . History of chicken pox   . Hypertension   . Migraine    occular  . TIA (transient ischemic attack)      PAST SURGICAL HISTORY: Past Surgical History:  Procedure Laterality Date  . BREAST BIOPSY    . CHOLECYSTECTOMY    . PARTIAL HYSTERECTOMY       FAMILY HISTORY: Family History  Problem Relation Age of Onset  . Breast cancer Mother   . Heart disease Father   . Heart disease Sister        pacemaker  . Diabetes Sister      SOCIAL HISTORY: Social History   Socioeconomic History  . Marital status: Married    Spouse name: Not on file  . Number of children: Not on file  . Years of education: Not on file  . Highest education level: Not on file  Occupational History  . Not on file  Tobacco Use  . Smoking status: Never Smoker  . Smokeless tobacco: Never Used  Vaping Use  . Vaping Use: Never used  Substance and Sexual Activity  . Alcohol use: Yes    Alcohol/week: 0.0 standard  drinks    Comment: wine occ.  . Drug use: No  . Sexual activity: Not on file  Other Topics Concern  . Not on file  Social History Narrative   Lives with husband.  5 children.  Education BA.   Social Determinants of Health   Financial Resource Strain:   . Difficulty of Paying Living Expenses: Not on file  Food Insecurity:   . Worried About Charity fundraiser in the Last Year: Not on file  . Ran Out of Food in the Last Year: Not on file  Transportation Needs:   . Lack of Transportation (Medical): Not on file  . Lack of Transportation (Non-Medical): Not on file  Physical Activity:   . Days of Exercise per Week: Not on file  . Minutes of Exercise per  Session: Not on file  Stress:   . Feeling of Stress : Not on file  Social Connections:   . Frequency of Communication with Friends and Family: Not on file  . Frequency of Social Gatherings with Friends and Family: Not on file  . Attends Religious Services: Not on file  . Active Member of Clubs or Organizations: Not on file  . Attends Archivist Meetings: Not on file  . Marital Status: Not on file  Intimate Partner Violence:   . Fear of Current or Ex-Partner: Not on file  . Emotionally Abused: Not on file  . Physically Abused: Not on file  . Sexually Abused: Not on file      PHYSICAL EXAM  Vitals:   09/07/20 1440  BP: (!) 164/77  Pulse: 92  Weight: 216 lb (98 kg)  Height: 5' (1.524 m)   Body mass index is 42.18 kg/m.   Generalized: Well developed, in no acute distress   Cardiology: normal rate and rhythm Respiratory: clear to auscultation bilaterally   Neurological examination  Mentation: Alert oriented to time, place, history taking. Follows all commands speech and language fluent Cranial nerve II-XII: Pupils were equal round reactive to light. Extraocular movements were full, visual field were full on confrontational test. Facial sensation and strength were normal. Uvula tongue midline. Head turning and  shoulder shrug  were normal and symmetric. Motor: The motor testing reveals 5 over 5 strength of all 4 extremities. Good symmetric motor tone is noted throughout.  Sensory: Sensory testing is intact to soft touch on all 4 extremities. No evidence of extinction is noted.  Coordination: Cerebellar testing reveals good finger-nose-finger and heel-to-shin bilaterally.  Gait and station: Gait is normal.    DIAGNOSTIC DATA (LABS, IMAGING, TESTING) - I reviewed patient records, labs, notes, testing and imaging myself where available.  Lab Results  Component Value Date   WBC 8.6 08/11/2020   HGB 12.7 08/11/2020   HCT 39.6 08/11/2020   MCV 88.2 08/11/2020   PLT 290 08/11/2020      Component Value Date/Time   NA 135 08/11/2020 1705   K 4.2 08/11/2020 1705   CL 99 08/11/2020 1705   CO2 25 08/11/2020 1705   GLUCOSE 103 (H) 08/11/2020 1705   BUN 17 08/11/2020 1705   CREATININE 0.46 08/11/2020 1705   CREATININE 0.56 (L) 12/10/2015 1537   CALCIUM 9.3 08/11/2020 1705   PROT 6.6 03/09/2020 1104   ALBUMIN 3.6 03/09/2020 1104   AST 24 03/09/2020 1104   ALT 36 (H) 03/09/2020 1104   ALKPHOS 90 03/09/2020 1104   BILITOT 0.4 03/09/2020 1104   GFRNONAA >60 08/11/2020 1705   GFRAA >60 08/10/2018 1330   Lab Results  Component Value Date   CHOL 172 03/09/2020   HDL 54.20 03/09/2020   LDLCALC 104 (H) 03/09/2020   LDLDIRECT 125.0 09/09/2012   TRIG 70.0 03/09/2020   CHOLHDL 3 03/09/2020   Lab Results  Component Value Date   HGBA1C 5.7 09/12/2019   No results found for: VITAMINB12 Lab Results  Component Value Date   TSH 2.43 03/09/2020      ASSESSMENT AND PLAN  86 y.o. year old female  has a past medical history of Anemia, Blood transfusion complicating pregnancy (5053), GERD (gastroesophageal reflux disease), History of chicken pox, Hypertension, Migraine, and TIA (transient ischemic attack). here with   Migraine with aura and without status migrainosus, not intractable  Essential  hypertension  Ann Conley has not increased propranolol 20mg  dosing to twice daily.  I have reeducated her on the previous recommendation from Dr Leta Baptist last month. She will increase propranolol 20 mg twice daily. She will use Excedrin sparingly and try to treat migraine once she notices pain. She was encouraged to continue close follow up with PCP regarding BP. She reports readings are somewhat lower at home. She is asymptomatic today. Healthy lifestyle habits encouraged. She will follow up in 6 months.   I spent 20 minutes of face-to-face and non-face-to-face time with patient.  This included previsit chart review, lab review, study review, order entry, electronic health record documentation, patient education.    Debbora Presto, MSN, FNP-C 09/08/2020, 10:17 AM  [facility] [address] [place], [place] [address] [phone]

## 2020-09-08 ENCOUNTER — Encounter: Payer: Self-pay | Admitting: Family Medicine

## 2020-09-08 NOTE — Progress Notes (Signed)
I reviewed note and agree with plan.   Penni Bombard, MD 94/05/961, 8:36 PM Certified in Neurology, Neurophysiology and Neuroimaging  [facility] [address], [place], [place] [address] [phone]

## 2020-09-15 ENCOUNTER — Encounter: Payer: Self-pay | Admitting: Family Medicine

## 2020-09-15 ENCOUNTER — Other Ambulatory Visit: Payer: Self-pay

## 2020-09-15 ENCOUNTER — Ambulatory Visit (INDEPENDENT_AMBULATORY_CARE_PROVIDER_SITE_OTHER): Payer: Medicare HMO | Admitting: Family Medicine

## 2020-09-15 VITALS — BP 136/68 | HR 56 | Temp 98.2°F | Resp 15 | Ht 60.0 in | Wt 219.2 lb

## 2020-09-15 DIAGNOSIS — I10 Essential (primary) hypertension: Secondary | ICD-10-CM | POA: Diagnosis not present

## 2020-09-15 DIAGNOSIS — G43109 Migraine with aura, not intractable, without status migrainosus: Secondary | ICD-10-CM

## 2020-09-15 NOTE — Patient Instructions (Signed)
Schedule your complete physical in 6 months Continue the Amlodipine once daily and the Propranolol twice daily Keep up the good work on your walking- that's great! Call with any questions or concerns Stay Safe!  Stay healthy! Happy Holidays!

## 2020-09-15 NOTE — Progress Notes (Signed)
   Subjective:    Patient ID: Ann Conley, female    DOB: 1934-03-04, 86 y.o.   MRN: [id number]  HPI HAs- Pt has long history of complicated migraines.  She has hx of partial vision loss w/ L arm numbness when she gets her migraines.  She was advised by Dr Leta Baptist to increase her propranolol to 20mg  BID but she did not do that.  She saw Neuro again on 11/2 and was again told to increase Propranolol to 20mg  BID.  She feels that things are improving.  HTN- pt has hx of hard to control BP.  Currently on Amlodipine 10mg  daily and Propranolol 20mg  BID.  Denies CP, SOB, visual changes, edema.  Obesity- pt's weight is stable since May and BMI is 42.81  No regular exercise, 'I've been trying to walk a little bit more.  Not following a particular diet   Review of Systems For ROS see HPI   This visit occurred during the SARS-CoV-2 public health emergency.  Safety protocols were in place, including screening questions prior to the visit, additional usage of staff PPE, and extensive cleaning of exam room while observing appropriate contact time as indicated for disinfecting solutions.       Objective:   Physical Exam Vitals reviewed.  Constitutional:      General: She is not in acute distress.    Appearance: She is well-developed. She is obese. She is not ill-appearing.  HENT:     Head: Normocephalic and atraumatic.  Eyes:     Conjunctiva/sclera: Conjunctivae normal.     Pupils: Pupils are equal, round, and reactive to light.  Neck:     Thyroid: No thyromegaly.  Cardiovascular:     Rate and Rhythm: Normal rate and regular rhythm.     Heart sounds: Normal heart sounds. No murmur heard.   Pulmonary:     Effort: Pulmonary effort is normal. No respiratory distress.     Breath sounds: Normal breath sounds.  Abdominal:     General: There is no distension.     Palpations: Abdomen is soft.     Tenderness: There is no abdominal tenderness.  Musculoskeletal:     Cervical back: Normal  range of motion and neck supple.  Lymphadenopathy:     Cervical: No cervical adenopathy.  Skin:    General: Skin is warm and dry.  Neurological:     Mental Status: She is alert and oriented to person, place, and time.  Psychiatric:        Behavior: Behavior normal.           Assessment & Plan:

## 2020-09-15 NOTE — Assessment & Plan Note (Signed)
Chronic problem.  Better control since increasing Propranolol to BID.  Remains on Amlodipine 10mg  daily.  Currently asymptomatic.

## 2020-09-15 NOTE — Assessment & Plan Note (Signed)
Improved since increasing Propranolol to 20mg  BID.  Encouraged her to continue to follow w/ neurology.  She is agreeable to this.

## 2020-09-15 NOTE — Assessment & Plan Note (Signed)
Ongoing issue for pt.  She is now trying to walk more regularly.  Applauded her efforts.  Will follow.

## 2020-09-16 DIAGNOSIS — R69 Illness, unspecified: Secondary | ICD-10-CM | POA: Diagnosis not present

## 2020-10-08 DIAGNOSIS — H04123 Dry eye syndrome of bilateral lacrimal glands: Secondary | ICD-10-CM | POA: Diagnosis not present

## 2020-10-08 DIAGNOSIS — G43809 Other migraine, not intractable, without status migrainosus: Secondary | ICD-10-CM | POA: Diagnosis not present

## 2020-10-08 DIAGNOSIS — H0102B Squamous blepharitis left eye, upper and lower eyelids: Secondary | ICD-10-CM | POA: Diagnosis not present

## 2020-10-08 DIAGNOSIS — H26493 Other secondary cataract, bilateral: Secondary | ICD-10-CM | POA: Diagnosis not present

## 2020-10-20 ENCOUNTER — Telehealth: Payer: Self-pay | Admitting: Family Medicine

## 2020-10-20 NOTE — Telephone Encounter (Signed)
Left message for patient to schedule Annual Wellness Visit.  Please schedule with Nurse Health Advisor Martha Stanley, RN at [facility]  

## 2020-10-28 NOTE — Telephone Encounter (Signed)
Pt called back she doesn't want to do that at this time//ELEA   She states that someone came to her home and did a wellness visit from her insurance company.

## 2020-11-08 ENCOUNTER — Other Ambulatory Visit: Payer: Self-pay

## 2020-11-08 ENCOUNTER — Telehealth (INDEPENDENT_AMBULATORY_CARE_PROVIDER_SITE_OTHER): Payer: Medicare Other | Admitting: Family Medicine

## 2020-11-08 ENCOUNTER — Encounter: Payer: Self-pay | Admitting: Family Medicine

## 2020-11-08 DIAGNOSIS — Z20822 Contact with and (suspected) exposure to covid-19: Secondary | ICD-10-CM | POA: Diagnosis not present

## 2020-11-08 NOTE — Progress Notes (Signed)
Virtual Visit via Video   I connected with patient on 11/08/20 at 11:30 AM EST by a video enabled telemedicine application and verified that I am speaking with the correct person using two identifiers.  Location patient: Home Location provider: [facility] Persons participating in the virtual visit: Patient, Provider, CMA (Sabrina M)  I discussed the limitations of evaluation and management by telemedicine and the availability of in person appointments. The patient expressed understanding and agreed to proceed.  Subjective:   HPI:   Sore throat- 'it's going on with both Ann Conley and me'.  Son came from [facility] w/ his 5 children over the holidays.  He developed 'flu like symptoms' upon arrival.  Son tested negative for COVID.  'the worst thing is the sore throat and the coughing'.  Denies fevers.  No night sweats.  + decreased appetite.  + fatigue.  Denies SOB.  + constant dull HA.  + body aches.  Cough is intermittently productive of 'phlegm.  Has not had booster shot.    ROS:   See pertinent positives and negatives per HPI.  Patient Active Problem List   Diagnosis Date Noted  . 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 Primary osteoarthritis of both knees 06/01/2017  . 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 Bleeding hemorrhoid 04/13/2016  . Complicated migraine 02/21/2016  . Keratoma 10/19/2014  . Pain in lower limb 10/19/2014  . Hammer toe of left foot 10/19/2014  . Morbid obesity (HCC) 09/17/2014  . Snoring 03/13/2013  . Left anterior knee pain 03/13/2013  . Mole (skin) 09/18/2011  . Migraine with aura 05/07/2011  . Essential hypertension 04/25/2011  . GERD (gastroesophageal reflux disease) 04/25/2011  . Annual physical exam 04/25/2011  . BREAST CANCER, HX OF 06/21/2007    Social History   Tobacco Use  . Smoking status: Never Smoker  . Smokeless tobacco: Never Used  Substance Use Topics  . Alcohol use: Yes    Alcohol/week: 0.0 standard drinks    Comment:  wine occ.    Current Outpatient Medications:  .  amLODipine (NORVASC) 10 MG tablet, Take 1 tablet (10 mg total) by mouth daily. (Patient taking differently: Take 10 mg by mouth daily. Taking 1), Disp: 90 tablet, Rfl: 1 .  Ascorbic Acid (VITAMIN C PO), Take by mouth., Disp: , Rfl:  .  aspirin 81 MG tablet, Take 81 mg by mouth every evening. , Disp: , Rfl:  .  aspirin-acetaminophen-caffeine (EXCEDRIN MIGRAINE) 250-250-65 MG tablet, Take by mouth every 6 (six) hours as needed for headache., Disp: , Rfl:  .  ibuprofen (ADVIL) 800 MG tablet, Take 1 tablet (800 mg total) by mouth every 8 (eight) hours as needed for headache., Disp: 30 tablet, Rfl: 0 .  Magnesium 250 MG TABS, Take 250 mg by mouth daily., Disp: , Rfl:  .  Multiple Vitamins-Minerals (MULTIVITAMIN ADULT PO), Take 1 tablet by mouth daily. , Disp: , Rfl:  .  multivitamin-lutein (OCUVITE-LUTEIN) CAPS capsule, Take 1 capsule by mouth daily., Disp: , Rfl:  .  omeprazole (PRILOSEC) 20 MG capsule, Take 1 capsule (20 mg total) by mouth daily., Disp: 90 capsule, Rfl: 0 .  propranolol (INDERAL) 20 MG tablet, Take 1 tablet (20 mg total) by mouth 2 (two) times daily., Disp: 60 tablet, Rfl: 12 .  vitamin B-12 (CYANOCOBALAMIN) 100 MCG tablet, Take 100 mcg by mouth daily., Disp: , Rfl:   Allergies  Allergen Reactions  . Codeine Nausea And Vomiting    Objective:   There were no vitals  taken for this visit. AAOx3, NAD NCAT, EOMI No obvious CN deficits Pale Dry, hacking cough Pt is able to speak clearly, coherently without shortness of breath or increased work of breathing.  Thought process is linear.  Mood is appropriate.   Assessment and Plan:   Suspected COVID- son recently came from [facility] to visit and he became ill once he arrived.  Since last week, pt has had cough, sore throat, HA, body aches.  Pt reports son tested negative for COVID so she seemed very surprised when I told her I feel that both her and her husband need to be tested.  She  has not yet had her booster.  Reviewed supportive care- tylenol, ibuprofen, fluids, rest, Mucinex DM or Delsym- and red flags that should prompt immediate attention.  Stressed that she needs to be tested if she hopes to qualify for an antibody infusion.  Since pt lives in [place], recommended the testing center at the [facility].  Pt was given address, website, and contact information.  Annye Asa, MD 11/08/2020

## 2020-11-08 NOTE — Progress Notes (Signed)
I connected with  Ann Conley on 11/08/20 by a video enabled telemedicine application and verified that I am speaking with the correct person using two identifiers.   I discussed the limitations of evaluation and management by telemedicine. The patient expressed understanding and agreed to proceed.

## 2020-11-15 ENCOUNTER — Other Ambulatory Visit: Payer: Medicare Other

## 2020-11-15 ENCOUNTER — Ambulatory Visit (INDEPENDENT_AMBULATORY_CARE_PROVIDER_SITE_OTHER): Payer: Medicare Other | Admitting: Family Medicine

## 2020-11-15 VITALS — BP 118/74 | HR 63 | Temp 97.1°F

## 2020-11-15 DIAGNOSIS — R059 Cough, unspecified: Secondary | ICD-10-CM

## 2020-11-15 DIAGNOSIS — Z20822 Contact with and (suspected) exposure to covid-19: Secondary | ICD-10-CM

## 2020-11-15 DIAGNOSIS — I10 Essential (primary) hypertension: Secondary | ICD-10-CM

## 2020-11-15 DIAGNOSIS — Z09 Encounter for follow-up examination after completed treatment for conditions other than malignant neoplasm: Secondary | ICD-10-CM

## 2020-11-15 DIAGNOSIS — R0989 Other specified symptoms and signs involving the circulatory and respiratory systems: Secondary | ICD-10-CM

## 2020-11-15 DIAGNOSIS — R062 Wheezing: Secondary | ICD-10-CM

## 2020-11-15 MED ORDER — ALBUTEROL SULFATE HFA 108 (90 BASE) MCG/ACT IN AERS: 2.0000 | INHALATION_SPRAY | Freq: Four times a day (QID) | RESPIRATORY_TRACT | 11 refills | Status: DC | PRN

## 2020-11-15 MED ORDER — BENZONATATE 100 MG PO CAPS: 100.0000 mg | ORAL_CAPSULE | Freq: Three times a day (TID) | ORAL | 0 refills | Status: DC | PRN

## 2020-11-15 NOTE — Patient Instructions (Signed)
Benzonatate capsules What is this medicine? BENZONATATE (ben ZOE na tate) is used to treat cough. This medicine may be used for other purposes; ask your health care provider or pharmacist if you have questions. COMMON BRAND NAME(S): Tessalon Perles, Zonatuss What should I tell my health care provider before I take this medicine? They need to know if you have any of these conditions:  kidney or liver disease  an unusual or allergic reaction to benzonatate, anesthetics, other medicines, foods, dyes, or preservatives  pregnant or trying to get pregnant  breast-feeding How should I use this medicine? Take this medicine by mouth with a glass of water. Follow the directions on the prescription label. Avoid breaking, chewing, or sucking the capsule, as this can cause serious side effects. Take your medicine at regular intervals. Do not take your medicine more often than directed. Talk to your pediatrician regarding the use of this medicine in children. While this drug may be prescribed for children as young as 14 years old for selected conditions, precautions do apply. Overdosage: If you think you have taken too much of this medicine contact a poison control center or emergency room at once. NOTE: This medicine is only for you. Do not share this medicine with others. What if I miss a dose? If you miss a dose, take it as soon as you can. If it is almost time for your next dose, take only that dose. Do not take double or extra doses. What may interact with this medicine? Do not take this medicine with any of the following medications:  MAOIs like Carbex, Eldepryl, Marplan, Nardil, and Parnate This list may not describe all possible interactions. Give your health care provider a list of all the medicines, herbs, non-prescription drugs, or dietary supplements you use. Also tell them if you smoke, drink alcohol, or use illegal drugs. Some items may interact with your medicine. What should I watch for  while using this medicine? Tell your doctor if your symptoms do not improve or if they get worse. If you have a high fever, skin rash, or headache, see your health care professional. You may get drowsy or dizzy. Do not drive, use machinery, or do anything that needs mental alertness until you know how this medicine affects you. Do not sit or stand up quickly, especially if you are an older patient. This reduces the risk of dizzy or fainting spells. What side effects may I notice from receiving this medicine? Side effects that you should report to your doctor or health care professional as soon as possible:  allergic reactions like skin rash, itching or hives, swelling of the face, lips, or tongue  breathing problems  chest pain  confusion or hallucinations  irregular heartbeat  numbness of mouth or throat  seizures Side effects that usually do not require medical attention (report to your doctor or health care professional if they continue or are bothersome):  burning feeling in the eyes  constipation  headache  nasal congestion  stomach upset This list may not describe all possible side effects. Call your doctor for medical advice about side effects. You may report side effects to FDA at 1-800-FDA-1088. Where should I keep my medicine? Keep out of the reach of children. Store at room temperature between 15 and 30 degrees C (59 and 86 degrees F). Keep tightly closed. Protect from light and moisture. Throw away any unused medicine after the expiration date. NOTE: This sheet is a summary. It may not cover all possible information.  If you have questions about this medicine, talk to your doctor, pharmacist, or health care provider.  2021 Elsevier/Gold Standard (2008-01-22 14:52:56) Cough, Adult A cough helps to clear your throat and lungs. A cough may be a sign of an illness or another medical condition. An acute cough may only last 2-3 weeks, while a chronic cough may last 8 or  more weeks. Many things can cause a cough. They include:  Germs (viruses or bacteria) that attack the airway.  Breathing in things that bother (irritate) your lungs.  Allergies.  Asthma.  Mucus that runs down the back of your throat (postnasal drip).  Smoking.  Acid backing up from the stomach into the tube that moves food from the mouth to the stomach (gastroesophageal reflux).  Some medicines.  Lung problems.  Other medical conditions, such as heart failure or a blood clot in the lung (pulmonary embolism). Follow these instructions at home: Medicines  Take over-the-counter and prescription medicines only as told by your doctor.  Talk with your doctor before you take medicines that stop a cough (cough suppressants). Lifestyle  Do not smoke, and try not to be around smoke. Do not use any products that contain nicotine or tobacco, such as cigarettes, e-cigarettes, and chewing tobacco. If you need help quitting, ask your doctor.  Drink enough fluid to keep your pee (urine) pale yellow.  Avoid caffeine.  Do not drink alcohol if your doctor tells you not to drink.   General instructions  Watch for any changes in your cough. Tell your doctor about them.  Always cover your mouth when you cough.  Stay away from things that make you cough, such as perfume, candles, campfire smoke, or cleaning products.  If the air is dry, use a cool mist vaporizer or humidifier in your home.  If your cough is worse at night, try using extra pillows to raise your head up higher while you sleep.  Rest as needed.  Keep all follow-up visits as told by your doctor. This is important.   Contact a doctor if:  You have new symptoms.  You cough up pus.  Your cough does not get better after 2-3 weeks, or your cough gets worse.  Cough medicine does not help your cough and you are not sleeping well.  You have pain that gets worse or pain that is not helped with medicine.  You have a  fever.  You are losing weight and you do not know why.  You have night sweats. Get help right away if:  You cough up blood.  You have trouble breathing.  Your heartbeat is very fast. These symptoms may be an emergency. Do not wait to see if the symptoms will go away. Get medical help right away. Call your local emergency services (911 in the [place] Do not drive yourself to the hospital. Summary  A cough helps to clear your throat and lungs. Many things can cause a cough.  Take over-the-counter and prescription medicines only as told by your doctor.  Always cover your mouth when you cough.  Contact a doctor if you have new symptoms or you have a cough that does not get better or gets worse. This information is not intended to replace advice given to you by your health care provider. Make sure you discuss any questions you have with your health care provider. Document Revised: 12/12/2019 Document Reviewed: 11/11/2018 Elsevier Patient Education  2021 [employer]. Albuterol Metered Dose Inhaler (MDI) What is this medicine? ALBUTEROL (al  Norm[place]ean [place]) is a bronchodilator. It treats bronchospasm. Bronchospasm is when you have trouble breathing and make loud or whistling sounds when you breathe. This drug opens the airways in the lungs so it is easier to breathe. This medicine may be used for other purposes; ask your health care provider or pharmacist if you have questions. COMMON BRAND NAME(S): Proair HFA, Proventil, Proventil HFA, Respirol, Ventolin, Ventolin HFA What should I tell my health care provider before I take this medicine? They need to know if you have any of the following conditions:  diabetes (high blood sugar)  heart disease  high blood pressure  irregular heartbeat or rhythm  pheochromocytoma  seizures  thyroid disease  an unusual or allergic reaction to albuterol, levalbuterol, other medicines, foods, dyes, or preservatives  pregnant or trying to get  pregnant  breast-feeding How should I use this medicine? This medicine is inhaled through the mouth. Take it as directed on the prescription label. Do not use it more often than directed. This medicine comes with INSTRUCTIONS FOR USE. Ask your pharmacist for directions on how to use this medicine. Read the information carefully. Talk to your pharmacist or health care provider if you have questions. Talk to your health care provider about the use of this medicine in children. While it may be given to children for selected conditions, precautions do apply. Overdosage: If you think you have taken too much of this medicine contact a poison control center or emergency room at once. NOTE: This medicine is only for you. Do not share this medicine with others. What if I miss a dose? If you take this medication on a regular basis, take it as soon as you can. If it is almost time for your next dose, take only that dose. Do not take double or extra doses. What may interact with this medicine?  anti-infectives like chloroquine and pentamidine  caffeine  cisapride  diuretics  medicines for colds  medicines for depression or for emotional or psychotic conditions  medicines for weight loss including some herbal products  methadone  some antibiotics like clarithromycin, erythromycin, levofloxacin, and linezolid  some heart medicines  steroid hormones like dexamethasone, cortisone, hydrocortisone  theophylline  thyroid hormones This list may not describe all possible interactions. Give your health care provider a list of all the medicines, herbs, non-prescription drugs, or dietary supplements you use. Also tell them if you smoke, drink alcohol, or use illegal drugs. Some items may interact with your medicine. What should I watch for while using this medicine? Visit your health care provider for regular checks on your progress. Tell your health care provider if your symptoms do not start to get  better or if they get worse. If your symptoms get worse or if you are using this drug more than normal, call your health care provider right away. Do not treat yourself for coughs, colds or allergies without asking your health care provider for advice. Some nonprescription medicines can affect this one. You and your health care provider should develop an Asthma Action Plan that is just for you. Be sure to know what to do if you are in the yellow (asthma is getting worse) or red (medical alert) zones. Your mouth may get dry. Chewing sugarless gum or sucking hard candy and drinking plenty of water may help. Contact your health care provider if the problem does not go away or is severe. What side effects may I notice from receiving this medicine? Side effects that you should  report to your doctor or health care professional as soon as possible:  allergic reactions (skin rash, itching or hives; swelling of the face, lips, or tongue)  fever  heartbeat rhythm changes (trouble breathing; chest pain; dizziness; fast, irregular heartbeat; feeling faint or lightheaded, falls)  increase in blood pressure  muscle cramps, pain  muscle weakness  pain, tingling, numbness in the hands or feet  vomiting Side effects that usually do not require medical attention (report to your doctor or health care professional if they continue or are bothersome):  change in taste  cough  dry mouth  headache  nasal congestion (runny or stuffy nose)  sore throat  tremors  trouble sleeping  upset stomach This list may not describe all possible side effects. Call your doctor for medical advice about side effects. You may report side effects to FDA at 1-800-FDA-1088. Where should I keep my medicine? Keep out of the reach of children and pets. Store at room temperature between 20 and 25 degrees C (68 and 77 degrees F). Keep inhaler away from extreme heat and cold. Get rid of it when the dose counter reads 0 or  after the expiration date, whichever is first. NOTE: This sheet is a summary. It may not cover all possible information. If you have questions about this medicine, talk to your doctor, pharmacist, or health care provider.  2021 Elsevier/Gold Standard (2020-10-08 10:51:44)

## 2020-11-15 NOTE — Progress Notes (Signed)
[facility]   New Patient  Subjective:  Patient ID: Ann Conley, female    DOB: 12-28-1933  Age: 87 y.o. MRN: [id number]  CC:  Chief Complaint  Patient presents with  . Cough    Pt has cough has had x2 weeks,    HPI Ann Conley is a 87 year old female who presents for Flu-like Symptoms today.    Patient Active Problem List   Diagnosis Date Noted  . 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 Primary osteoarthritis of both knees 06/01/2017  . 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 Bleeding hemorrhoid 04/13/2016  . Complicated migraine 02/21/2016  . Keratoma 10/19/2014  . Pain in lower limb 10/19/2014  . Hammer toe of left foot 10/19/2014  . Morbid obesity (HCC) 09/17/2014  . Snoring 03/13/2013  . Left anterior knee pain 03/13/2013  . Mole (skin) 09/18/2011  . Migraine with aura 05/07/2011  . Essential hypertension 04/25/2011  . GERD (gastroesophageal reflux disease) 04/25/2011  . Annual physical exam 04/25/2011  . BREAST CANCER, HX OF 06/21/2007    Current Status: She has c/o lingering cough since her son visited from [place], who arrived with Flu-like Symptoms. He since tested negative for Covid. She has been taking Robitussin and throat lozenges with no relief in symptoms. She denies fevers, chills, fatigue, recent infections, weight loss, and night sweats. She has not had any headaches, visual changes, dizziness, and falls. No chest pain, heart palpitations, cough and shortness of breath reported. Denies GI problems such as nausea, vomiting, diarrhea, and constipation. She has no reports of blood in stools, dysuria and hematuria. No depression or anxiety reported today. She is taking all medications as prescribed.  Past Medical History:  Diagnosis Date  . Anemia   . Blood transfusion complicating pregnancy 1961  . GERD (gastroesophageal reflux disease)   . History of chicken pox   . Hypertension   . Migraine    occular  . TIA  (transient ischemic attack)     Past Surgical History:  Procedure Laterality Date  . BREAST BIOPSY    . CHOLECYSTECTOMY    . PARTIAL HYSTERECTOMY      Family History  Problem Relation Age of Onset  . Breast cancer Mother   . Heart disease Father   . Heart disease Sister        pacemaker  . Diabetes Sister     Social History   Socioeconomic History  . Marital status: Married    Spouse name: Not on file  . Number of children: Not on file  . Years of education: Not on file  . Highest education level: Not on file  Occupational History  . Not on file  Tobacco Use  . Smoking status: Never Smoker  . Smokeless tobacco: Never Used  Vaping Use  . Vaping Use: Never used  Substance and Sexual Activity  . Alcohol use: Yes    Alcohol/week: 0.0 standard drinks    Comment: wine occ.  . Drug use: No  . Sexual activity: Not on file  Other Topics Concern  . Not on file  Social History Narrative   Lives with husband.  5 children.  Education BA.   Social Determinants of Health   Financial Resource Strain: Not on file  Food Insecurity: Not on file  Transportation Needs: Not on file  Physical Activity: Not on file  Stress: Not on file  Social Connections: Not on file  Intimate Partner Violence: Not on file  Outpatient Medications Prior to Visit  Medication Sig Dispense Refill  . amLODipine (NORVASC) 10 MG tablet Take 1 tablet (10 mg total) by mouth daily. (Patient taking differently: Take 10 mg by mouth daily. Taking 1) 90 tablet 1  . Ascorbic Acid (VITAMIN C PO) Take by mouth.    Marland Kitchen aspirin 81 MG tablet Take 81 mg by mouth every evening.     Marland Kitchen aspirin-acetaminophen-caffeine (EXCEDRIN MIGRAINE) 250-250-65 MG tablet Take by mouth every 6 (six) hours as needed for headache.    . ibuprofen (ADVIL) 800 MG tablet Take 1 tablet (800 mg total) by mouth every 8 (eight) hours as needed for headache. 30 tablet 0  . Magnesium 250 MG TABS Take 250 mg by mouth daily.    . Multiple  Vitamins-Minerals (MULTIVITAMIN ADULT PO) Take 1 tablet by mouth daily.     . multivitamin-lutein (OCUVITE-LUTEIN) CAPS capsule Take 1 capsule by mouth daily.    Marland Kitchen omeprazole (PRILOSEC) 20 MG capsule Take 1 capsule (20 mg total) by mouth daily. 90 capsule 0  . propranolol (INDERAL) 20 MG tablet Take 1 tablet (20 mg total) by mouth 2 (two) times daily. 60 tablet 12  . vitamin B-12 (CYANOCOBALAMIN) 100 MCG tablet Take 100 mcg by mouth daily.     No facility-administered medications prior to visit.    Allergies  Allergen Reactions  . Codeine Nausea And Vomiting    ROS Review of Systems  Constitutional: Negative.   HENT: Negative.   Eyes: Negative.   Respiratory: Positive for cough (frequently).   Cardiovascular: Negative.   Gastrointestinal: Negative.   Endocrine: Negative.   Genitourinary: Negative.   Musculoskeletal: Positive for arthralgias (generalized ).  Skin: Negative.   Allergic/Immunologic: Negative.   Neurological: Positive for dizziness (occasional ) and headaches (occasional ).  Hematological: Negative.   Psychiatric/Behavioral: Negative.       Objective:    Physical Exam Vitals and nursing note reviewed.  Constitutional:      Appearance: Normal appearance.  HENT:     Nose: Nose normal.     Mouth/Throat:     Mouth: Mucous membranes are moist.     Pharynx: Oropharynx is clear.  Cardiovascular:     Rate and Rhythm: Normal rate and regular rhythm.     Pulses: Normal pulses.     Heart sounds: Normal heart sounds.  Pulmonary:     Breath sounds: Wheezing (bilateral lower lung lobes) and rhonchi (bilateral lower lung lobes) present.  Musculoskeletal:     Cervical back: Normal range of motion and neck supple.  Skin:    General: Skin is warm and dry.  Neurological:     Mental Status: She is alert.  Psychiatric:        Mood and Affect: Mood normal.        Behavior: Behavior normal.        Thought Content: Thought content normal.        Judgment: Judgment  normal.     BP 118/74 (BP Location: Right Arm, Patient Position: Sitting, Cuff Size: Normal)   Pulse 63   Temp (!) 97.1 F (36.2 C) (Temporal)   SpO2 97%  Wt Readings from Last 3 Encounters:  09/15/20 219 lb 3.2 oz (99.4 kg)  09/07/20 216 lb (98 kg)  08/11/20 224 lb 9.6 oz (101.9 kg)     There are no preventive care reminders to display for this patient.  There are no preventive care reminders to display for this patient.  Lab Results  Component Value  Date   TSH 2.43 03/09/2020   Lab Results  Component Value Date   WBC 8.6 08/11/2020   HGB 12.7 08/11/2020   HCT 39.6 08/11/2020   MCV 88.2 08/11/2020   PLT 290 08/11/2020   Lab Results  Component Value Date   NA 135 08/11/2020   K 4.2 08/11/2020   CO2 25 08/11/2020   GLUCOSE 103 (H) 08/11/2020   BUN 17 08/11/2020   CREATININE 0.46 08/11/2020   BILITOT 0.4 03/09/2020   ALKPHOS 90 03/09/2020   AST 24 03/09/2020   ALT 36 (H) 03/09/2020   PROT 6.6 03/09/2020   ALBUMIN 3.6 03/09/2020   CALCIUM 9.3 08/11/2020   ANIONGAP 11 08/11/2020   GFR 123.39 03/09/2020   Lab Results  Component Value Date   CHOL 172 03/09/2020   Lab Results  Component Value Date   HDL 54.20 03/09/2020   Lab Results  Component Value Date   LDLCALC 104 (H) 03/09/2020   Lab Results  Component Value Date   TRIG 70.0 03/09/2020   Lab Results  Component Value Date   CHOLHDL 3 03/09/2020   Lab Results  Component Value Date   HGBA1C 5.7 09/12/2019   Assessment & Plan:   1. Suspected COVID-19 virus infection Patient declines screening for Coronavirus today.  2. Cough - albuterol (VENTOLIN HFA) 108 (90 Base) MCG/ACT inhaler; Inhale 2 puffs into the lungs every 6 (six) hours as needed for wheezing or shortness of breath.  Dispense: 8 g; Refill: 11 - benzonatate (TESSALON) 100 MG capsule; Take 1 capsule (100 mg total) by mouth 3 (three) times daily as needed for cough.  Dispense: 30 capsule; Refill: 0  3. Wheezes - albuterol  (VENTOLIN HFA) 108 (90 Base) MCG/ACT inhaler; Inhale 2 puffs into the lungs every 6 (six) hours as needed for wheezing or shortness of breath.  Dispense: 8 g; Refill: 11  4. Rhonchi at both lung bases - albuterol (VENTOLIN HFA) 108 (90 Base) MCG/ACT inhaler; Inhale 2 puffs into the lungs every 6 (six) hours as needed for wheezing or shortness of breath.  Dispense: 8 g; Refill: 11  5. Essential hypertension The current medical regimen is effective; blood pressure is stable at 118/74 today; continue present plan and medications as prescribed. She will continue to take medications as prescribed, to decrease high sodium intake, excessive alcohol intake, increase potassium intake, smoking cessation, and increase physical activity of at least 30 minutes of cardio activity daily. She will continue to follow Heart Healthy or DASH diet.  6. Follow up She will follow up as needed.   Meds ordered this encounter  Medications  . albuterol (VENTOLIN HFA) 108 (90 Base) MCG/ACT inhaler    Sig: Inhale 2 puffs into the lungs every 6 (six) hours as needed for wheezing or shortness of breath.    Dispense:  8 g    Refill:  11  . benzonatate (TESSALON) 100 MG capsule    Sig: Take 1 capsule (100 mg total) by mouth 3 (three) times daily as needed for cough.    Dispense:  30 capsule    Refill:  0    No orders of the defined types were placed in this encounter.   Referral Orders  No referral(s) requested today    Kathe Becton, MSN, ANE, FNP-BC [facility] [address] [place], [place] [address] [phone]- fax   Problem List Items Addressed This Visit  Cardiovascular and Mediastinum   Essential hypertension    Other Visit Diagnoses    Suspected COVID-19 virus infection    -  Primary   Cough       Relevant Medications   albuterol (VENTOLIN HFA) 108 (90 Base) MCG/ACT inhaler   benzonatate  (TESSALON) 100 MG capsule   Wheezes       Relevant Medications   albuterol (VENTOLIN HFA) 108 (90 Base) MCG/ACT inhaler   Rhonchi at both lung bases       Relevant Medications   albuterol (VENTOLIN HFA) 108 (90 Base) MCG/ACT inhaler   Follow up          Meds ordered this encounter  Medications  . albuterol (VENTOLIN HFA) 108 (90 Base) MCG/ACT inhaler    Sig: Inhale 2 puffs into the lungs every 6 (six) hours as needed for wheezing or shortness of breath.    Dispense:  8 g    Refill:  11  . benzonatate (TESSALON) 100 MG capsule    Sig: Take 1 capsule (100 mg total) by mouth 3 (three) times daily as needed for cough.    Dispense:  30 capsule    Refill:  0    Follow-up: No follow-ups on file.    Azzie Glatter, FNP

## 2020-11-19 ENCOUNTER — Encounter: Payer: Self-pay | Admitting: Family Medicine

## 2020-11-22 ENCOUNTER — Ambulatory Visit: Payer: Medicare Other

## 2020-12-03 ENCOUNTER — Other Ambulatory Visit: Payer: Self-pay | Admitting: Physician Assistant

## 2020-12-06 ENCOUNTER — Other Ambulatory Visit: Payer: Self-pay | Admitting: *Deleted

## 2020-12-06 MED ORDER — OMEPRAZOLE 20 MG PO CPDR: 20.0000 mg | DELAYED_RELEASE_CAPSULE | Freq: Every day | ORAL | 1 refills | Status: DC

## 2020-12-14 ENCOUNTER — Telehealth: Payer: Self-pay | Admitting: Family Medicine

## 2020-12-14 NOTE — Telephone Encounter (Signed)
Called and LMVM x 2.

## 2020-12-14 NOTE — Telephone Encounter (Signed)
LMVM for pt to return call for her message about worsening migranes.

## 2020-12-14 NOTE — Telephone Encounter (Signed)
Pt called, have more migraines. Asking if you can work me in a sooner appt than 5/2. Would like a call from the nurse.

## 2020-12-15 NOTE — Telephone Encounter (Signed)
I called pt and LMVM for her that returned call.  Will wait to hear back from her.

## 2020-12-15 NOTE — Telephone Encounter (Signed)
Spoke to pt she has noted worsening migraine frequency.  Taking propranlol twice daily, exedrin migraine or motrin 800mg  prn.  Has L arm numbness, L eye vision/aura.  (similar presentation) as previous migraines.  Made appt to address 12-16-20.  Pt appreciated call.

## 2020-12-15 NOTE — Telephone Encounter (Signed)
Pt has called Sandy, RN back.  Please call 

## 2020-12-16 ENCOUNTER — Encounter: Payer: Self-pay | Admitting: Family Medicine

## 2020-12-16 ENCOUNTER — Ambulatory Visit: Payer: Medicare Other | Admitting: Family Medicine

## 2020-12-16 VITALS — BP 161/79 | HR 71 | Ht 60.0 in | Wt 220.0 lb

## 2020-12-16 DIAGNOSIS — I10 Essential (primary) hypertension: Secondary | ICD-10-CM | POA: Diagnosis not present

## 2020-12-16 DIAGNOSIS — G43109 Migraine with aura, not intractable, without status migrainosus: Secondary | ICD-10-CM

## 2020-12-16 MED ORDER — PROPRANOLOL HCL ER 60 MG PO CP24: 60.0000 mg | ORAL_CAPSULE | Freq: Every day | ORAL | 1 refills | Status: DC

## 2020-12-16 NOTE — Progress Notes (Signed)
I reviewed note and agree with plan.   Penni Bombard, MD 02/20/5300, 04:04 AM Certified in Neurology, Neurophysiology and Neuroimaging  [facility] [address] [place], [place] [address] [phone]

## 2020-12-16 NOTE — Patient Instructions (Signed)
Below is our plan:  We will change propranolol to an extended release version. New prescription will be for propranolol ER 60mg  once daily. Please continue to monitor blood pressures at home. Follow up very closely with PCP to help manage blood pressure readings. Consider seeing a sleep doctor to test for sleep apnea if you feel this may be a concern.   Please make sure you are staying well hydrated. I recommend 50-60 ounces daily. Well balanced diet and regular exercise encouraged. Consistent sleep schedule with 6-8 hours recommended.   Please continue follow up with care team as directed.   Follow up with me in 6 months   You may receive a survey regarding today's visit. I encourage you to leave honest feed back as I do use this information to improve patient care. Thank you for seeing me today!     Sleep Apnea Sleep apnea affects breathing during sleep. It causes breathing to stop for a short time or to become shallow. It can also increase the risk of:  Heart attack.  Stroke.  Being very overweight (obese).  Diabetes.  Heart failure.  Irregular heartbeat. The goal of treatment is to help you breathe normally again. What are the causes? There are three kinds of sleep apnea:  Obstructive sleep apnea. This is caused by a blocked or collapsed airway.  Central sleep apnea. This happens when the brain does not send the right signals to the muscles that control breathing.  Mixed sleep apnea. This is a combination of obstructive and central sleep apnea. The most common cause of this condition is a collapsed or blocked airway. This can happen if:  Your throat muscles are too relaxed.  Your tongue and tonsils are too large.  You are overweight.  Your airway is too small.   What increases the risk?  Being overweight.  Smoking.  Having a small airway.  Being older.  Being female.  Drinking alcohol.  Taking medicines to calm yourself (sedatives or  tranquilizers).  Having family members with the condition. What are the signs or symptoms?  Trouble staying asleep.  Being sleepy or tired during the day.  Getting angry a lot.  Loud snoring.  Headaches in the morning.  Not being able to focus your mind (concentrate).  Forgetting things.  Less interest in sex.  Mood swings.  Personality changes.  Feelings of sadness (depression).  Waking up a lot during the night to pee (urinate).  Dry mouth.  Sore throat. How is this diagnosed?  Your medical history.  A physical exam.  A test that is done when you are sleeping (sleep study). The test is most often done in a sleep lab but may also be done at home. How is this treated?  Sleeping on your side.  Using a medicine to get rid of mucus in your nose (decongestant).  Avoiding the use of alcohol, medicines to help you relax, or certain pain medicines (narcotics).  Losing weight, if needed.  Changing your diet.  Not smoking.  Using a machine to open your airway while you sleep, such as: ? An oral appliance. This is a mouthpiece that shifts your lower jaw forward. ? A CPAP device. This device blows air through a mask when you breathe out (exhale). ? An EPAP device. This has valves that you put in each nostril. ? A BPAP device. This device blows air through a mask when you breathe in (inhale) and breathe out.  Having surgery if other treatments do not  work. It is important to get treatment for sleep apnea. Without treatment, it can lead to:  High blood pressure.  Coronary artery disease.  In men, not being able to have an erection (impotence).  Reduced thinking ability.   Follow these instructions at home: Lifestyle  Make changes that your doctor recommends.  Eat a healthy diet.  Lose weight if needed.  Avoid alcohol, medicines to help you relax, and some pain medicines.  Do not use any products that contain nicotine or tobacco, such as cigarettes,  e-cigarettes, and chewing tobacco. If you need help quitting, ask your doctor. General instructions  Take over-the-counter and prescription medicines only as told by your doctor.  If you were given a machine to use while you sleep, use it only as told by your doctor.  If you are having surgery, make sure to tell your doctor you have sleep apnea. You may need to bring your device with you.  Keep all follow-up visits as told by your doctor. This is important. Contact a doctor if:  The machine that you were given to use during sleep bothers you or does not seem to be working.  You do not get better.  You get worse. Get help right away if:  Your chest hurts.  You have trouble breathing in enough air.  You have an uncomfortable feeling in your back, arms, or stomach.  You have trouble talking.  One side of your body feels weak.  A part of your face is hanging down. These symptoms may be an emergency. Do not wait to see if the symptoms will go away. Get medical help right away. Call your local emergency services (911 in the [place] Do not drive yourself to the hospital. Summary  This condition affects breathing during sleep.  The most common cause is a collapsed or blocked airway.  The goal of treatment is to help you breathe normally while you sleep. This information is not intended to replace advice given to you by your health care provider. Make sure you discuss any questions you have with your health care provider. Document Revised: 08/09/2018 Document Reviewed: 06/18/2018 Elsevier Patient Education  2021 [place].    Stroke Prevention Some medical conditions and lifestyle choices can lead to a higher risk for a stroke. You can help to prevent a stroke by eating healthy foods and exercising. It also helps to not smoke and to manage any health problems you may have. How can this condition affect me? A stroke is an emergency. It should be treated right away. A stroke  can lead to brain damage or threaten your life. There is a better chance of surviving and getting better after a stroke if you get medical help right away. What can increase my risk? The following medical conditions may increase your risk of a stroke:  Diseases of the heart and blood vessels (cardiovascular disease).  High blood pressure (hypertension).  Diabetes.  High cholesterol.  Sickle cell disease.  Problems with blood clotting.  Being very overweight.  Sleeping problems (obstructivesleep apnea). Other risk factors include:  Being older than age 86.  A history of blood clots, stroke, or mini-stroke (TIA).  Race, ethnic background, or a family history of stroke.  Smoking or using tobacco products.  Taking birth control pills, especially if you smoke.  Heavy alcohol and drug use.  Not being active. What actions can I take to prevent this? Manage your health conditions  High cholesterol. ? Eat a healthy diet. If  this is not enough to manage your cholesterol, you may need to take medicines. ? Take medicines as told by your doctor.  High blood pressure. ? Try to keep your blood pressure below 130/80. ? If your blood pressure cannot be managed through a healthy diet and regular exercise, you may need to take medicines. ? Take medicines as told by your doctor. ? Ask your doctor if you should check your blood pressure at home. ? Have your blood pressure checked every year.  Diabetes. ? Eat a healthy diet and get regular exercise. If your blood sugar (glucose) cannot be managed through diet and exercise, you may need to take medicines. ? Take medicines as told by your doctor.  Talk to your doctor about getting checked for sleeping problems. Signs of a problem can include: ? Snoring a lot. ? Feeling very tired.  Make sure that you manage any other conditions you have. Nutrition  Follow instructions from your doctor about what to eat or drink. You may be told  to: ? Eat and drink fewer calories each day. ? Limit how much salt (sodium) you use to 1,500 milligrams (mg) each day. ? Use only healthy fats for cooking, such as olive oil, canola oil, and sunflower oil. ? Eat healthy foods. To do this:  Choose foods that are high in fiber. These include whole grains, and fresh fruits and vegetables.  Eat at least 5 servings of fruits and vegetables a day. Try to fill one-half of your plate with fruits and vegetables at each meal.  Choose low-fat (lean) proteins. These include low-fat cuts of meat, chicken without skin, fish, tofu, beans, and nuts.  Eat low-fat dairy products. ? Avoid foods that:  Are high in salt.  Have saturated fat.  Have trans fat.  Have cholesterol.  Are processed or pre-made. ? Count how many carbohydrates you eat and drink each day.   Lifestyle  If you drink alcohol: ? Limit how much you have to:  0-1 drink a day for women who are not pregnant.  0-2 drinks a day for men. ? Know how much alcohol is in your drink. In the [place] one drink equals one 12 oz bottle of beer (324mL), one 5 oz glass of wine (129mL), or one 1 oz glass of hard liquor (64mL).  Do not smoke or use any products that have nicotine or tobacco. If you need help quitting, ask your doctor.  Avoid secondhand smoke.  Do not use drugs. Activity  Try to stay at a healthy weight.  Get at least 30 minutes of exercise on most days, such as: ? Fast walking. ? Biking. ? Swimming.   Medicines  Take over-the-counter and prescription medicines only as told by your doctor.  Avoid taking birth control pills. Talk to your doctor about the risks of taking birth control pills if: ? You are over 54 years old. ? You smoke. ? You get very bad headaches. ? You have had a blood clot. Where to find more information  American Stroke Association: www.strokeassociation.org Get help right away if:  You or a loved one has any signs of a stroke. "BE FAST" is  an easy way to remember the warning signs: ? B - Balance. Dizziness, sudden trouble walking, or loss of balance. ? E - Eyes. Trouble seeing or a change in how you see. ? F - Face. Sudden weakness or loss of feeling of the face. The face or eyelid may droop on one side. ? A -  Arms. Weakness or loss of feeling in an arm. This happens all of a sudden and most often on one side of the body. ? S - Speech. Sudden trouble speaking, slurred speech, or trouble understanding what people say. ? T - Time. Time to call emergency services. Write down what time symptoms started.  You or a loved one has other signs of a stroke, such as: ? A sudden, very bad headache with no known cause. ? Feeling like you may vomit (nausea). ? Vomiting. ? A seizure. These symptoms may be an emergency. Get help right away. Call your local emergency services (911 in the [place]  Do not wait to see if the symptoms will go away.  Do not drive yourself to the hospital. Summary  You can help to prevent a stroke by eating healthy, exercising, and not smoking. It also helps to manage any health problems you have.  Do not smoke or use any products that contain nicotine or tobacco.  Get help right away if you or a loved one has any signs of a stroke. This information is not intended to replace advice given to you by your health care provider. Make sure you discuss any questions you have with your health care provider. Document Revised: 05/24/2020 Document Reviewed: 05/24/2020 Elsevier Patient Education  2021 [place].   Migraine Headache A migraine headache is a very strong throbbing pain on one side or both sides of your head. This type of headache can also cause other symptoms. It can last from 4 hours to 3 days. Talk with your doctor about what things may bring on (trigger) this condition. What are the causes? The exact cause of this condition is not known. This condition may be triggered or caused by:  Drinking  alcohol.  Smoking.  Taking medicines, such as: ? Medicine used to treat chest pain (nitroglycerin). ? Birth control pills. ? Estrogen. ? Some blood pressure medicines.  Eating or drinking certain products.  Doing physical activity. Other things that may trigger a migraine headache include:  Having a menstrual period.  Pregnancy.  Hunger.  Stress.  Not getting enough sleep or getting too much sleep.  Weather changes.  Tiredness (fatigue). What increases the risk?  Being 70-65 years old.  Being female.  Having a family history of migraine headaches.  Being Caucasian.  Having depression or anxiety.  Being very overweight. What are the signs or symptoms?  A throbbing pain. This pain may: ? Happen in any area of the head, such as on one side or both sides. ? Make it hard to do daily activities. ? Get worse with physical activity. ? Get worse around bright lights or loud noises.  Other symptoms may include: ? Feeling sick to your stomach (nauseous). ? Vomiting. ? Dizziness. ? Being sensitive to bright lights, loud noises, or smells.  Before you get a migraine headache, you may get warning signs (an aura). An aura may include: ? Seeing flashing lights or having blind spots. ? Seeing bright spots, halos, or zigzag lines. ? Having tunnel vision or blurred vision. ? Having numbness or a tingling feeling. ? Having trouble talking. ? Having weak muscles.  Some people have symptoms after a migraine headache (postdromal phase), such as: ? Tiredness. ? Trouble thinking (concentrating). How is this treated?  Taking medicines that: ? Relieve pain. ? Relieve the feeling of being sick to your stomach. ? Prevent migraine headaches.  Treatment may also include: ? Having acupuncture. ? Avoiding foods that bring  on migraine headaches. ? Learning ways to control your body functions (biofeedback). ? Therapy to help you know and deal with negative thoughts  (cognitive behavioral therapy). Follow these instructions at home: Medicines  Take over-the-counter and prescription medicines only as told by your doctor.  Ask your doctor if the medicine prescribed to you: ? Requires you to avoid driving or using heavy machinery. ? Can cause trouble pooping (constipation). You may need to take these steps to prevent or treat trouble pooping:  Drink enough fluid to keep your pee (urine) pale yellow.  Take over-the-counter or prescription medicines.  Eat foods that are high in fiber. These include beans, whole grains, and fresh fruits and vegetables.  Limit foods that are high in fat and sugar. These include fried or sweet foods. Lifestyle  Do not drink alcohol.  Do not use any products that contain nicotine or tobacco, such as cigarettes, e-cigarettes, and chewing tobacco. If you need help quitting, ask your doctor.  Get at least 8 hours of sleep every night.  Limit and deal with stress. General instructions  Keep a journal to find out what may bring on your migraine headaches. For example, write down: ? What you eat and drink. ? How much sleep you get. ? Any change in what you eat or drink. ? Any change in your medicines.  If you have a migraine headache: ? Avoid things that make your symptoms worse, such as bright lights. ? It may help to lie down in a dark, quiet room. ? Do not drive or use heavy machinery. ? Ask your doctor what activities are safe for you.  Keep all follow-up visits as told by your doctor. This is important.      Contact a doctor if:  You get a migraine headache that is different or worse than others you have had.  You have more than 15 headache days in one month. Get help right away if:  Your migraine headache gets very bad.  Your migraine headache lasts longer than 72 hours.  You have a fever.  You have a stiff neck.  You have trouble seeing.  Your muscles feel weak or like you cannot control  them.  You start to lose your balance a lot.  You start to have trouble walking.  You pass out (faint).  You have a seizure. Summary  A migraine headache is a very strong throbbing pain on one side or both sides of your head. These headaches can also cause other symptoms.  This condition may be treated with medicines and changes to your lifestyle.  Keep a journal to find out what may bring on your migraine headaches.  Contact a doctor if you get a migraine headache that is different or worse than others you have had.  Contact your doctor if you have more than 15 headache days in a month. This information is not intended to replace advice given to you by your health care provider. Make sure you discuss any questions you have with your health care provider. Document Revised: 02/14/2019 Document Reviewed: 12/05/2018 Elsevier Patient Education  [place].

## 2020-12-16 NOTE — Progress Notes (Signed)
Chief Complaint  Patient presents with  . Follow-up    RM 1 alone Pt states migraines are better but she continues to have aura 2-3 times a month     HISTORY OF PRESENT ILLNESS: 12/16/20 ALL:  She returns for follow up for complicated migraines. At last visit, we encouraged her to increase propranolol. She reports taking 20mg  BID. She does feel that migraines are not as painful. She continues to have a visual aura with some sort of headache 1-3 times a month. PCP gave her 800mg  ibuprofen that seems to help. She reports taking 1-2 doses of ibuprofen.   She continues close follow up with PCP for blood pressure management on amlodipine 10mg  daily. She does check on occasion at home but does not remember what readings are. She reports that readings are usually "out of the ballpark". When I ask if today's reading is high for her she reports that it has been this high at home.    09/07/2020 ALL:  Ann Conley is a 87 y.o. female here today for follow up for complicated migraines. She has history of partial vision loss with left arm numbness associated with headache. She was last seen 07/14/2020 by Dr Leta Baptist and advised to increase propranolol to 20mg  BID. She has not increased dose. She has had two migraines since being seen. She was evaluated by the ER on 10/6. CT brain normal. She reports symptoms resolved spontaneously without abortive therapy. She usually takes Excedrin but states that she rarely needs to take it. She reports BP is "always high". Reports it was 144/63 at home yesterday.   HISTORY (copied from Dr Gladstone Lighter note on 07/14/2020)  UPDATE (07/14/20, VRP): Since last visit, doing well except for some more migraine, numbness and vision issues. No alleviating or aggravating factors. Tolerating meds. Has only been taking propranolol 20mg  daily.     UPDATE (01/07/19, VRP): Since last visit, doing well. Symptoms are resolved. Severity is mild. No alleviating or aggravating factors.  Tolerating meds. Having 1-2 HA per month, and controlled with Excedrin migraine.  PRIOR HPI (09/14/18): 87 year old female here for evaluation:  STABBING HEADACHES Patient has had long history since childhood of intermittent sharp stabbing headaches lasting for a few seconds, on the right side of her head.  These can occur several times per day and several times per month.  Patient looked up online and diagnosed herself with ice pick headaches.  She does not take any medications for these symptoms because they are so short lasting.  They have slightly worsened in the last few months.  Patient has been under more stress with her husband's illness and other family issues.  MIGRAINE TYPE HA Since 2016 patient has had a different type of headache which consists of partial vision loss in the left eye, followed by severe global pounding throbbing headaches associated with photophobia.  No nausea or vomiting.  Headaches can last 2 hours or longer at a time.  She diagnosed herself with "ocular migraines".  She is taken over-the-counter medications with mild relief.  These headaches have been worse than last 6 to 12 months, but have spontaneously resolved since August 2019.  LEFT ARM NUMBNESS 08/10/2018 patient noticed sudden onset of left arm numbness and weakness.  She was having trouble controlling her left arm.  She also noticed some numbness in her left face.  Symptoms lasted for 1 hour.  Patient went to the emergency room for evaluation.  CT of the head and lab testing were  unremarkable.  Blood pressure was 175/85.  Apparently "low concern for TIA" was determined and patient was encouraged to follow-up with PCP for evaluation. On 08/23/2018 patient had recurrence of symptoms, with left arm and left face numbness and tingling.  Patient called PCP who recommended ER evaluation but patient declined to go.  HYPERTENSION Patient ran out of her blood pressure medications 2 days ago.  She says that her blood  pressure is "always high".  She is on amlodipine 10 mg daily.  Today blood pressure is 170/89.     REVIEW OF SYSTEMS: Out of a complete 14 system review of symptoms, the patient complains only of the following symptoms, headaches and all other reviewed systems are negative.    ALLERGIES: Allergies  Allergen Reactions  . Codeine Nausea And Vomiting     HOME MEDICATIONS: Outpatient Medications Prior to Visit  Medication Sig Dispense Refill  . albuterol (VENTOLIN HFA) 108 (90 Base) MCG/ACT inhaler Inhale 2 puffs into the lungs every 6 (six) hours as needed for wheezing or shortness of breath. 8 g 11  . amLODipine (NORVASC) 10 MG tablet Take 1 tablet (10 mg total) by mouth daily. (Patient taking differently: Take 10 mg by mouth daily. Taking 1) 90 tablet 1  . Ascorbic Acid (VITAMIN C PO) Take by mouth.    Marland Kitchen aspirin 81 MG tablet Take 81 mg by mouth every evening.     Marland Kitchen aspirin-acetaminophen-caffeine (EXCEDRIN MIGRAINE) 250-250-65 MG tablet Take by mouth every 6 (six) hours as needed for headache.    . benzonatate (TESSALON) 100 MG capsule Take 1 capsule (100 mg total) by mouth 3 (three) times daily as needed for cough. 30 capsule 0  . ibuprofen (ADVIL) 800 MG tablet TAKE ONE TABLET BY MOUTH EVERY 8 HOURS AS NEEDED FOR HEADACHE 30 tablet 0  . Magnesium 250 MG TABS Take 250 mg by mouth daily.    . multivitamin-lutein (OCUVITE-LUTEIN) CAPS capsule Take 1 capsule by mouth daily.    Marland Kitchen omeprazole (PRILOSEC) 20 MG capsule Take 1 capsule (20 mg total) by mouth daily. 90 capsule 1  . vitamin B-12 (CYANOCOBALAMIN) 100 MCG tablet Take 100 mcg by mouth daily.    . Multiple Vitamins-Minerals (MULTIVITAMIN ADULT PO) Take 1 tablet by mouth daily.     . propranolol (INDERAL) 20 MG tablet Take 1 tablet (20 mg total) by mouth 2 (two) times daily. 60 tablet 12   No facility-administered medications prior to visit.     PAST MEDICAL HISTORY: Past Medical History:  Diagnosis Date  . Anemia   . Blood  transfusion complicating pregnancy 7867  . GERD (gastroesophageal reflux disease)   . History of chicken pox   . Hypertension   . Migraine    occular  . TIA (transient ischemic attack)      PAST SURGICAL HISTORY: Past Surgical History:  Procedure Laterality Date  . BREAST BIOPSY    . CHOLECYSTECTOMY    . PARTIAL HYSTERECTOMY       FAMILY HISTORY: Family History  Problem Relation Age of Onset  . Breast cancer Mother   . Heart disease Father   . Heart disease Sister        pacemaker  . Diabetes Sister      SOCIAL HISTORY: Social History   Socioeconomic History  . Marital status: Married    Spouse name: Not on file  . Number of children: Not on file  . Years of education: Not on file  . Highest education level: Not on  file  Occupational History  . Not on file  Tobacco Use  . Smoking status: Never Smoker  . Smokeless tobacco: Never Used  Vaping Use  . Vaping Use: Never used  Substance and Sexual Activity  . Alcohol use: Yes    Alcohol/week: 0.0 standard drinks    Comment: wine occ.  . Drug use: No  . Sexual activity: Not on file  Other Topics Concern  . Not on file  Social History Narrative   Lives with husband.  5 children.  Education BA.   Social Determinants of Health   Financial Resource Strain: Not on file  Food Insecurity: Not on file  Transportation Needs: Not on file  Physical Activity: Not on file  Stress: Not on file  Social Connections: Not on file  Intimate Partner Violence: Not on file      PHYSICAL EXAM  Vitals:   12/16/20 0954  BP: (!) 161/79  Pulse: 71  Weight: 220 lb (99.8 kg)  Height: 5' (1.524 m)   Body mass index is 42.97 kg/m.   Generalized: Well developed, in no acute distress   Cardiology: normal rate and rhythm Respiratory: clear to auscultation bilaterally   Neurological examination  Mentation: Alert oriented to time, place, history taking. Follows all commands speech and language fluent Cranial nerve  II-XII: Pupils were equal round reactive to light. Right iris with darker coloring at 6 o'clock. Extraocular movements were full, visual field were full on confrontational test. Facial sensation and strength were normal.. Head turning and shoulder shrug  were normal and symmetric. Motor: The motor testing reveals 5 over 5 strength of all 4 extremities. Good symmetric motor tone is noted throughout.  Sensory: Sensory testing is intact to soft touch on all 4 extremities. No evidence of extinction is noted.  Coordination: Cerebellar testing reveals good finger-nose-finger and heel-to-shin bilaterally.  Gait and station: Gait is normal.    DIAGNOSTIC DATA (LABS, IMAGING, TESTING) - I reviewed patient records, labs, notes, testing and imaging myself where available.  Lab Results  Component Value Date   WBC 8.6 08/11/2020   HGB 12.7 08/11/2020   HCT 39.6 08/11/2020   MCV 88.2 08/11/2020   PLT 290 08/11/2020      Component Value Date/Time   NA 135 08/11/2020 1705   K 4.2 08/11/2020 1705   CL 99 08/11/2020 1705   CO2 25 08/11/2020 1705   GLUCOSE 103 (H) 08/11/2020 1705   BUN 17 08/11/2020 1705   CREATININE 0.46 08/11/2020 1705   CREATININE 0.56 (L) 12/10/2015 1537   CALCIUM 9.3 08/11/2020 1705   PROT 6.6 03/09/2020 1104   ALBUMIN 3.6 03/09/2020 1104   AST 24 03/09/2020 1104   ALT 36 (H) 03/09/2020 1104   ALKPHOS 90 03/09/2020 1104   BILITOT 0.4 03/09/2020 1104   GFRNONAA >60 08/11/2020 1705   GFRAA >60 08/10/2018 1330   Lab Results  Component Value Date   CHOL 172 03/09/2020   HDL 54.20 03/09/2020   LDLCALC 104 (H) 03/09/2020   LDLDIRECT 125.0 09/09/2012   TRIG 70.0 03/09/2020   CHOLHDL 3 03/09/2020   Lab Results  Component Value Date   HGBA1C 5.7 09/12/2019   No results found for: VITAMINB12 Lab Results  Component Value Date   TSH 2.43 03/09/2020      ASSESSMENT AND PLAN  87 y.o. year old female  has a past medical history of Anemia, Blood transfusion  complicating pregnancy (9935), GERD (gastroesophageal reflux disease), History of chicken pox, Hypertension, Migraine, and TIA (  transient ischemic attack). here with   Migraine with aura and without status migrainosus, not intractable  Essential hypertension  Janaiya does feel that headaches are better after starting propranolol 20mg  BID. She has tolerated medication well. She continues to have a visual aura described as blue circles in the left eye with milder headaches about 1-3 times per month. Ibuprofen 800mg  helps. BP remains elevated. She is asymptomatic today. Neuro exam normal. I will with her to propranolol ER 60mg  daily. She is aware that new dosing is once daily. She will continue to check BP at home. I have asked her to journal headaches and BP readings. She will continue close follow up with PCP regarding BP readings. We have discussed possible sleep apnea. She does snore but no other red flag warnings. I have given her information to review at home. She may call if concerned and we will refer her to sleep med.  Healthy lifestyle habits encouraged. She will follow up in 6 months.   I spent 25 minutes of face-to-face and non-face-to-face time with patient.  This included previsit chart review, lab review, study review, order entry, electronic health record documentation, patient education.    Debbora Presto, MSN, FNP-C 12/16/2020, 10:39 AM  [facility] [address] [place], [place] [address] [phone]

## 2021-01-19 ENCOUNTER — Telehealth: Payer: Self-pay | Admitting: Family Medicine

## 2021-01-19 ENCOUNTER — Encounter: Payer: Self-pay | Admitting: Family Medicine

## 2021-01-19 ENCOUNTER — Ambulatory Visit (INDEPENDENT_AMBULATORY_CARE_PROVIDER_SITE_OTHER): Payer: Medicare Other | Admitting: Family Medicine

## 2021-01-19 ENCOUNTER — Other Ambulatory Visit: Payer: Self-pay

## 2021-01-19 VITALS — BP 142/70 | HR 60 | Temp 98.8°F | Resp 20 | Ht 60.0 in | Wt 219.0 lb

## 2021-01-19 DIAGNOSIS — I10 Essential (primary) hypertension: Secondary | ICD-10-CM | POA: Diagnosis not present

## 2021-01-19 DIAGNOSIS — G43109 Migraine with aura, not intractable, without status migrainosus: Secondary | ICD-10-CM | POA: Diagnosis not present

## 2021-01-19 MED ORDER — VALSARTAN 40 MG PO TABS: 40.0000 mg | ORAL_TABLET | Freq: Every day | ORAL | 3 refills | Status: DC

## 2021-01-19 NOTE — Telephone Encounter (Signed)
I called pt back.  She is having migraine since Saturday.  C/o of intermittent numbness L fingers, hand arm (initially she stated felt heaviness),,ice pick pains , not any today. Her pain level is 2-3 now, ocular vision (small amt).  Her bp this am 170"s this am.  Taking her amlodipine and propranolol LA as ordered. Tylenol ES prn.  She states has complicated migraines.  Never been here for infusion.  Not on any rescue meds.  (went over s/s stroke).  She is not having any slurred speech, once sided weakness, facial droop.  L sided numbness, (she noted usually R sided is affected).  Please advise, no appt available.  I asked about her Bp and pcp , she states that they usually state to call us, (maybe pain related).

## 2021-01-19 NOTE — Assessment & Plan Note (Signed)
Deteriorated.  BP ranges from just above 140 up to 170s.  Since she is not well controlled on Amlodipine 10mg  daily and Propranolol 60mg  daily, we will add low dose Valsartan.  I don't want to increase her Propranolol b/c her HR is only 60 today.  Her last BMP showed normal Cr and K+ and we will check both at next visit.  Pt expressed understanding and is in agreement w/ plan.

## 2021-01-19 NOTE — Telephone Encounter (Signed)
Spoke to pt and relayed that she needs to see Bp about her BP management.  Last month seen and her Bp were elevated then as well.  She stated that this am sys bp 170's on amlodipine.  Taking propranolol LA 60mg  po qhs.  If migraines continue after bp management let us know.  Pt taking tylenol prn only.  I instructed about stroke s/s, rebound headaches with taking otc tylenol, motrin daily).  She verbalized understanding and appreciation for the call.

## 2021-01-19 NOTE — Telephone Encounter (Signed)
Pt called stating that she has had a migraine since Saturday. Pt would like to speak to the RN to see if she will be able to come in today or be advised on what she can do to alleviate the pain.

## 2021-01-19 NOTE — Patient Instructions (Signed)
Follow up in 4-6 weeks to recheck BP CONTINUE the Amlodipine and the Propranolol daily ADD the Valsartan once daily Drink plenty of water Ask neuro about starting a migraine medication (Imitrex, Maxalt, etc) or a preventative medication Call with any questions or concerns Hang in there!!!

## 2021-01-19 NOTE — Progress Notes (Signed)
   Subjective:    Patient ID: Ann Conley, female    DOB: 04-14-1934, 87 y.o.   MRN: [id number]  HPI HTN- chronic problem.  Pt on Amlodipine 10mg  daily, Propranolol 60mg  daily.  Home BPs have been running as high as 170s.  At the neurologist, BPs have been 160s.  Pulse is 60 today.    Migraines- pt reports she had an 'episode' Friday night and it 'still lingers'.  She contacted Neuro and they recommend she schedule an appt here to evaluate BP.  Pt is taking Ibuprofen 800mg  w/ some relief.  Took ES Tylenol this AM.  Review of Systems For ROS see HPI   This visit occurred during the SARS-CoV-2 public health emergency.  Safety protocols were in place, including screening questions prior to the visit, additional usage of staff PPE, and extensive cleaning of exam room while observing appropriate contact time as indicated for disinfecting solutions.       Objective:   Physical Exam Vitals reviewed.  Constitutional:      General: She is not in acute distress.    Appearance: Normal appearance. She is well-developed.  HENT:     Head: Normocephalic and atraumatic.  Eyes:     Comments: Sunglasses in place  Neck:     Thyroid: No thyromegaly.  Cardiovascular:     Rate and Rhythm: Normal rate and regular rhythm.     Heart sounds: Normal heart sounds. No murmur heard.   Pulmonary:     Effort: Pulmonary effort is normal. No respiratory distress.     Breath sounds: Normal breath sounds.  Abdominal:     General: There is no distension.     Palpations: Abdomen is soft.     Tenderness: There is no abdominal tenderness.  Musculoskeletal:     Cervical back: Normal range of motion and neck supple.  Lymphadenopathy:     Cervical: No cervical adenopathy.  Skin:    General: Skin is warm and dry.  Neurological:     Mental Status: She is alert and oriented to person, place, and time.  Psychiatric:        Behavior: Behavior normal.           Assessment & Plan:

## 2021-01-19 NOTE — Assessment & Plan Note (Signed)
Ongoing issue for pt.  She states that other than Excedrin migraine, OTC tylenol, and prescription strength Ibuprofen, she has never had a prescription for her migraines.  She states she has not had an abortive triptan nor a prophylactic other than the propranolol.  Pt does see neurology regularly and rather than overstep, I will defer changes in treatment plan to them.

## 2021-01-19 NOTE — Telephone Encounter (Signed)
I would like for her to be seen by PCP for elevated BP. She reported to me last month that readings "were out of the ball park". Symptoms she described could be due to elevated BP. If migraines are not improving with BP management I will be happy to see her.

## 2021-01-27 ENCOUNTER — Emergency Department (HOSPITAL_BASED_OUTPATIENT_CLINIC_OR_DEPARTMENT_OTHER): Payer: Medicare Other

## 2021-01-27 ENCOUNTER — Encounter (HOSPITAL_BASED_OUTPATIENT_CLINIC_OR_DEPARTMENT_OTHER): Payer: Self-pay

## 2021-01-27 ENCOUNTER — Encounter: Payer: Self-pay | Admitting: Family Medicine

## 2021-01-27 ENCOUNTER — Ambulatory Visit: Payer: Medicare Other | Admitting: Family Medicine

## 2021-01-27 ENCOUNTER — Other Ambulatory Visit: Payer: Self-pay

## 2021-01-27 ENCOUNTER — Emergency Department (HOSPITAL_BASED_OUTPATIENT_CLINIC_OR_DEPARTMENT_OTHER)
Admission: EM | Admit: 2021-01-27 | Discharge: 2021-01-27 | Disposition: A | Discharge status: Home / Self Care | Payer: Medicare Other | Attending: Emergency Medicine | Admitting: Emergency Medicine

## 2021-01-27 VITALS — BP 176/79 | HR 67 | Ht 60.0 in | Wt 217.0 lb

## 2021-01-27 DIAGNOSIS — Z853 Personal history of malignant neoplasm of breast: Secondary | ICD-10-CM | POA: Insufficient documentation

## 2021-01-27 DIAGNOSIS — R200 Anesthesia of skin: Secondary | ICD-10-CM | POA: Insufficient documentation

## 2021-01-27 DIAGNOSIS — Z7982 Long term (current) use of aspirin: Secondary | ICD-10-CM | POA: Insufficient documentation

## 2021-01-27 DIAGNOSIS — R29818 Other symptoms and signs involving the nervous system: Secondary | ICD-10-CM | POA: Diagnosis not present

## 2021-01-27 DIAGNOSIS — Z79899 Other long term (current) drug therapy: Secondary | ICD-10-CM | POA: Diagnosis not present

## 2021-01-27 DIAGNOSIS — G43109 Migraine with aura, not intractable, without status migrainosus: Secondary | ICD-10-CM | POA: Diagnosis not present

## 2021-01-27 DIAGNOSIS — Z8673 Personal history of transient ischemic attack (TIA), and cerebral infarction without residual deficits: Secondary | ICD-10-CM

## 2021-01-27 DIAGNOSIS — I10 Essential (primary) hypertension: Secondary | ICD-10-CM | POA: Insufficient documentation

## 2021-01-27 LAB — CBC
HCT: 39.9 % (ref 36.0–46.0)
Hemoglobin: 13.1 g/dL (ref 12.0–15.0)
MCH: 28.9 pg (ref 26.0–34.0)
MCHC: 32.8 g/dL (ref 30.0–36.0)
MCV: 87.9 fL (ref 80.0–100.0)
Platelets: 268 10*3/uL (ref 150–400)
RBC: 4.54 MIL/uL (ref 3.87–5.11)
RDW: 14.5 % (ref 11.5–15.5)
WBC: 7 10*3/uL (ref 4.0–10.5)
nRBC: 0 % (ref 0.0–0.2)

## 2021-01-27 LAB — DIFFERENTIAL
Abs Immature Granulocytes: 0.02 10*3/uL (ref 0.00–0.07)
Basophils Absolute: 0 10*3/uL (ref 0.0–0.1)
Basophils Relative: 1 %
Eosinophils Absolute: 0.2 10*3/uL (ref 0.0–0.5)
Eosinophils Relative: 2 %
Immature Granulocytes: 0 %
Lymphocytes Relative: 27 %
Lymphs Abs: 1.9 10*3/uL (ref 0.7–4.0)
Monocytes Absolute: 0.6 10*3/uL (ref 0.1–1.0)
Monocytes Relative: 9 %
Neutro Abs: 4.3 10*3/uL (ref 1.7–7.7)
Neutrophils Relative %: 61 %

## 2021-01-27 LAB — ETHANOL: Alcohol, Ethyl (B): <10 mg/dL (ref <10)

## 2021-01-27 LAB — COMPREHENSIVE METABOLIC PANEL
ALT: 20 U/L (ref 0–44)
AST: 21 U/L (ref 15–41)
Albumin: 3.9 g/dL (ref 3.5–5.0)
Alkaline Phosphatase: 82 U/L (ref 38–126)
Anion gap: 9 (ref 5–15)
BUN: 13 mg/dL (ref 8–23)
CO2: 27 mmol/L (ref 22–32)
Calcium: 9.2 mg/dL (ref 8.9–10.3)
Chloride: 99 mmol/L (ref 98–111)
Creatinine, Ser: 0.56 mg/dL (ref 0.44–1.00)
GFR, Estimated: >60 mL/min (ref >60)
Glucose, Bld: 117 mg/dL — ABNORMAL HIGH (ref 70–99)
Potassium: 3.7 mmol/L (ref 3.5–5.1)
Sodium: 135 mmol/L (ref 135–145)
Total Bilirubin: 0.3 mg/dL (ref 0.3–1.2)
Total Protein: 7.5 g/dL (ref 6.5–8.1)

## 2021-01-27 LAB — APTT: aPTT: 111 seconds — ABNORMAL HIGH (ref 24–36)

## 2021-01-27 LAB — PROTIME-INR
INR: 1 (ref 0.8–1.2)
Prothrombin Time: 12.6 seconds (ref 11.4–15.2)

## 2021-01-27 MED ORDER — NURTEC 75 MG PO TBDP: 75.0000 mg | ORAL_TABLET | Freq: Every day | ORAL | 0 refills | Status: DC | PRN

## 2021-01-27 MED ORDER — NURTEC 75 MG PO TBDP: 75.0000 mg | ORAL_TABLET | Freq: Every day | ORAL | 11 refills | Status: DC | PRN

## 2021-01-27 NOTE — Progress Notes (Signed)
I reviewed note and agree with plan.   Penni Bombard, MD 2/41/9914, 4:45 PM Certified in Neurology, Neurophysiology and Neuroimaging  [facility] [address] [place], [place] [address] [phone]

## 2021-01-27 NOTE — ED Provider Notes (Signed)
[facility] EMERGENCY DEPARTMENT Provider Note   CSN: [id number] Arrival date & time: 01/27/21  1726     History Chief Complaint  Patient presents with  . Numbness    Ann Conley is a 87 y.o. female. She has a history of hypertension TIA migraines.  She is complaining of left arm numbness that starts in her hand and ends at her left jaw that started last evening.  It was associated with some visual symptoms.  Its been on and off since then.  She feels her left hand has been intermittently clumsy.  She went to her neurologist and was evaluated and they have set her up for some outpatient MRIs.  She called her primary care doctor because her blood pressure was elevated at the office and they told her she should come and get a CAT scan.  No chest pain or shortness of breath.  She does have complex migraines with neurologic features. The history is provided by the patient and a relative.  Cerebrovascular Accident This is a new problem. The current episode started 12 to 24 hours ago. Episode frequency: intermittent. The problem has not changed since onset.Pertinent negatives include no chest pain, no abdominal pain, no headaches and no shortness of breath. Nothing aggravates the symptoms. Nothing relieves the symptoms. She has tried nothing for the symptoms. The treatment provided no relief.       Past Medical History:  Diagnosis Date  . Anemia   . Blood transfusion complicating pregnancy 3220  . GERD (gastroesophageal reflux disease)   . History of chicken pox   . Hypertension   . Migraine    occular  . TIA (transient ischemic attack)     Patient Active Problem List   Diagnosis Date Noted  . 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 Primary osteoarthritis of both knees 06/01/2017  . 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 Bleeding hemorrhoid 04/13/2016  . Complicated migraine 25/42/7062  . Keratoma 10/19/2014  . Pain in lower limb 10/19/2014  . Hammer toe of left  foot 10/19/2014  . Morbid obesity ([place]) 09/17/2014  . Snoring 03/13/2013  . Left anterior knee pain 03/13/2013  . Mole (skin) 09/18/2011  . Migraine with aura 05/07/2011  . Essential hypertension 04/25/2011  . GERD (gastroesophageal reflux disease) 04/25/2011  . Annual physical exam 04/25/2011  . BREAST CANCER, HX OF 06/21/2007    Past Surgical History:  Procedure Laterality Date  . BREAST BIOPSY    . CHOLECYSTECTOMY    . PARTIAL HYSTERECTOMY       OB History   No obstetric history on file.     Family History  Problem Relation Age of Onset  . Breast cancer Mother   . Heart disease Father   . Heart disease Sister        pacemaker  . Diabetes Sister     Social History   Tobacco Use  . Smoking status: Never Smoker  . Smokeless tobacco: Never Used  Vaping Use  . Vaping Use: Never used  Substance Use Topics  . Alcohol use: Yes    Alcohol/week: 0.0 standard drinks    Comment: wine occ.  . Drug use: No    Home Medications Prior to Admission medications   Medication Sig Start Date End Date Taking? Authorizing Provider  amLODipine (NORVASC) 10 MG tablet Take 1 tablet (10 mg total) by mouth daily. Patient taking differently: Take 10 mg by mouth daily. Taking 1 07/26/20   Midge Minium, MD  Ascorbic  Acid (VITAMIN C PO) Take by mouth.    [provider]  aspirin 81 MG tablet Take 81 mg by mouth every evening.     [provider]  aspirin-acetaminophen-caffeine (EXCEDRIN MIGRAINE) [phone] MG tablet Take by mouth every 6 (six) hours as needed for headache.    [provider]  ibuprofen (ADVIL) 800 MG tablet TAKE ONE TABLET BY MOUTH EVERY 8 HOURS AS NEEDED FOR HEADACHE 12/07/20   Midge Minium, MD  Magnesium 250 MG TABS Take 250 mg by mouth daily.    [provider]  multivitamin-lutein (OCUVITE-LUTEIN) CAPS capsule Take 1 capsule by mouth daily.    [provider]  omeprazole (PRILOSEC) 20 MG capsule Take 1 capsule  (20 mg total) by mouth daily. 12/06/20   Midge Minium, MD  propranolol ER (INDERAL LA) 60 MG 24 hr capsule Take 1 capsule (60 mg total) by mouth daily. 12/16/20   Lomax, Amy, NP  Rimegepant Sulfate (NURTEC) 75 MG TBDP Take 75 mg by mouth daily as needed (take for abortive therapy of migraine, no more than 1 tablet in 24 hours or 10 per month). 01/27/21   Lomax, Amy, NP  Rimegepant Sulfate (NURTEC) 75 MG TBDP Take 75 mg by mouth daily as needed (take for abortive therapy of migraine, no more than 1 tablet in 24 hours or 10 per month). 01/27/21   Lomax, Amy, NP  valsartan (DIOVAN) 40 MG tablet Take 1 tablet (40 mg total) by mouth daily. 01/19/21   Midge Minium, MD  vitamin B-12 (CYANOCOBALAMIN) 100 MCG tablet Take 100 mcg by mouth daily.    [provider]    Allergies    Codeine  Review of Systems   Review of Systems  Constitutional: Negative for fever.  HENT: Negative for sore throat.   Eyes: Positive for visual disturbance.  Respiratory: Negative for shortness of breath.   Cardiovascular: Negative for chest pain.  Gastrointestinal: Negative for abdominal pain.  Genitourinary: Negative for dysuria.  Musculoskeletal: Negative for gait problem and neck pain.  Skin: Negative for rash.  Neurological: Positive for numbness. Negative for headaches.    Physical Exam Updated Vital Signs BP (!) 129/52 (BP Location: Right Arm)   Pulse 67   Temp 97.8 F (36.6 C) (Oral)   Resp 18   Ht 5' (1.524 m)   Wt 98.4 kg   SpO2 97%   BMI 42.38 kg/m   Physical Exam Vitals and nursing note reviewed.  Constitutional:      General: She is not in acute distress.    Appearance: Normal appearance. She is well-developed.  HENT:     Head: Normocephalic and atraumatic.  Eyes:     Conjunctiva/sclera: Conjunctivae normal.  Cardiovascular:     Rate and Rhythm: Normal rate and regular rhythm.     Heart sounds: No murmur heard.   Pulmonary:     Effort: Pulmonary effort is normal. No  respiratory distress.     Breath sounds: Normal breath sounds.  Abdominal:     Palpations: Abdomen is soft.     Tenderness: There is no abdominal tenderness.  Musculoskeletal:        General: No deformity or signs of injury. Normal range of motion.     Cervical back: Neck supple.  Skin:    General: Skin is warm and dry.  Neurological:     General: No focal deficit present.     Mental Status: She is alert.     Cranial Nerves: No  cranial nerve deficit.     Sensory: No sensory deficit.     Motor: No weakness.     Gait: Gait normal.     ED Results / Procedures / Treatments   Labs (all labs ordered are listed, but only abnormal results are displayed) Labs Reviewed  APTT - Abnormal; Notable for the following components:      Result Value   aPTT 111 (*)    All other components within normal limits  COMPREHENSIVE METABOLIC PANEL - Abnormal; Notable for the following components:   Glucose, Bld 117 (*)    All other components within normal limits  ETHANOL  PROTIME-INR  CBC  DIFFERENTIAL    EKG EKG Interpretation  Date/Time:  Thursday January 27 2021 17:36:41 EDT Ventricular Rate:  69 PR Interval:    QRS Duration: 110 QT Interval:  425 QTC Calculation: 456 R Axis:   -31 Text Interpretation: Sinus rhythm Left axis deviation Abnormal R-wave progression, late transition Baseline wander in lead(s) V1 No significant change since prior 10/21 Confirmed by Aletta Edouard [phone]) on 01/27/2021 5:39:25 PM   Radiology CT HEAD WO CONTRAST  Result Date: 01/27/2021 CLINICAL DATA:  Neuro deficit, acute, stroke suspected; left arm numbness. Additional history provided: Patient reports left-sided hand numbness that radiates to upper arm since last night. EXAM: CT HEAD WITHOUT CONTRAST TECHNIQUE: Contiguous axial images were obtained from the base of the skull through the vertex without intravenous contrast. COMPARISON:  Prior head CT examinations 08/11/2020 and earlier. Brain MRI examinations  09/20/2018 and earlier. FINDINGS: Brain: Cerebral volume is normal for age. Moderate patchy and ill-defined hypoattenuation within the cerebral white matter is nonspecific, but compatible with chronic small vessel ischemic disease. There is no acute intracranial hemorrhage. No demarcated cortical infarct. No extra-axial fluid collection. No evidence of intracranial mass. No midline shift. Vascular: No hyperdense vessel.  Atherosclerotic calcifications Skull: Normal. Negative for fracture or focal lesion. Sinuses/Orbits: Visualized orbits show no acute finding. Trace bilateral ethmoid sinus mucosal thickening. IMPRESSION: No evidence of acute intracranial abnormality. Redemonstrated moderate cerebral white matter chronic small vessel ischemic disease. Minimal bilateral ethmoid sinus mucosal thickening. Electronically Signed   By: Kellie Simmering DO   On: 01/27/2021 18:15    Procedures Procedures   Medications Ordered in ED Medications - No data to display  ED Course  I have reviewed the triage vital signs and the nursing notes.  Pertinent labs & imaging results that were available during my care of the patient were reviewed by me and considered in my medical decision making (see chart for details).  Clinical Course as of 01/28/21 1033  Thu Jan 27, 2021  0932 Reviewed neurology note today by [facility] neurology.  They comment upon prior episodes of her type of symptoms in the setting of complex migraines.  They found her symptoms to be improved with some migraine medicine.  They set her up for outpatient MRI MRA to exclude other causes. [MB]  6712 I reviewed her neurologic evaluation today and her imaging and lab work today here with me with our neurologist on-call Dr. Curly Shores.  She felt that in the setting of having a neurologic evaluation already today with a outpatient plan for work-up that it would be reasonable to have the patient continue her outpatient work-up.  Return instructions or if any  worsening or new symptoms.  Will review with patient and daughter. [MB]    Clinical Course User Index [MB] Hayden Rasmussen, MD   MDM Rules/Calculators/A&P  This patient complains of left arm numbness intermittent; this involves an extensive number of treatment Options and is a complaint that carries with it a high risk of complications and Morbidity. The differential includes TIA, stroke, dissection, migraine, metabolic derangement  I ordered, reviewed and interpreted labs, which included CBC with normal white count normal hemoglobin, chemistries normal, coags normal I ordered imaging studies which included CT head and I independently    visualized and interpreted imaging which showed chronic white matter changes Additional history obtained from patient's daughter Previous records obtained and reviewed in epic including prior neurology visit today I consulted neurology Dr. Curly Shores and discussed lab and imaging findings  Critical Interventions: None  After the interventions stated above, I reevaluated the patient and found patient to be asymptomatic currently.  Reviewed work-up and neurology recommendations with her and her daughter.  They are comfortable plan for outpatient work-up.  Counseled on return to the emergency department if any recurrence or new neurologic findings.   Final Clinical Impression(s) / ED Diagnoses Final diagnoses:  Left arm numbness    Rx / DC Orders ED Discharge Orders    None       Hayden Rasmussen, MD 01/28/21 1035

## 2021-01-27 NOTE — Patient Instructions (Addendum)
Below is our plan:  We will continue propranolol 60mg  daily. You may take Nurtec for any future migraines. Please call PCP asap to discuss blood pressure elevation. Continue aspirin. I will order an MRI of your brain and neck to assess for any concerns of stroke. Please listen out for a call to schedule imaging next week. If any symptoms worsen or do not resolve with blood pressure management, please seek emergency medical attention.   Please make sure you are staying well hydrated. I recommend 50-60 ounces daily. Well balanced diet and regular exercise encouraged. Consistent sleep schedule with 6-8 hours recommended.   Please continue follow up with care team as directed.   Follow up with me in 3 months, sooner if needed   You may receive a survey regarding today's visit. I encourage you to leave honest feed back as I do use this information to improve patient care. Thank you for seeing me today!     Stroke Prevention Some medical conditions and lifestyle choices can lead to a higher risk for a stroke. You can help to prevent a stroke by eating healthy foods and exercising. It also helps to not smoke and to manage any health problems you may have. How can this condition affect me? A stroke is an emergency. It should be treated right away. A stroke can lead to brain damage or threaten your life. There is a better chance of surviving and getting better after a stroke if you get medical help right away. What can increase my risk? The following medical conditions may increase your risk of a stroke:  Diseases of the heart and blood vessels (cardiovascular disease).  High blood pressure (hypertension).  Diabetes.  High cholesterol.  Sickle cell disease.  Problems with blood clotting.  Being very overweight.  Sleeping problems (obstructivesleep apnea). Other risk factors include:  Being older than age 64.  A history of blood clots, stroke, or mini-stroke (TIA).  Race, ethnic  background, or a family history of stroke.  Smoking or using tobacco products.  Taking birth control pills, especially if you smoke.  Heavy alcohol and drug use.  Not being active. What actions can I take to prevent this? Manage your health conditions  High cholesterol. ? Eat a healthy diet. If this is not enough to manage your cholesterol, you may need to take medicines. ? Take medicines as told by your doctor.  High blood pressure. ? Try to keep your blood pressure below 130/80. ? If your blood pressure cannot be managed through a healthy diet and regular exercise, you may need to take medicines. ? Take medicines as told by your doctor. ? Ask your doctor if you should check your blood pressure at home. ? Have your blood pressure checked every year.  Diabetes. ? Eat a healthy diet and get regular exercise. If your blood sugar (glucose) cannot be managed through diet and exercise, you may need to take medicines. ? Take medicines as told by your doctor.  Talk to your doctor about getting checked for sleeping problems. Signs of a problem can include: ? Snoring a lot. ? Feeling very tired.  Make sure that you manage any other conditions you have. Nutrition  Follow instructions from your doctor about what to eat or drink. You may be told to: ? Eat and drink fewer calories each day. ? Limit how much salt (sodium) you use to 1,500 milligrams (mg) each day. ? Use only healthy fats for cooking, such as olive oil, canola oil,  and sunflower oil. ? Eat healthy foods. To do this:  Choose foods that are high in fiber. These include whole grains, and fresh fruits and vegetables.  Eat at least 5 servings of fruits and vegetables a day. Try to fill one-half of your plate with fruits and vegetables at each meal.  Choose low-fat (lean) proteins. These include low-fat cuts of meat, chicken without skin, fish, tofu, beans, and nuts.  Eat low-fat dairy products. ? Avoid foods that:  Are  high in salt.  Have saturated fat.  Have trans fat.  Have cholesterol.  Are processed or pre-made. ? Count how many carbohydrates you eat and drink each day.   Lifestyle  If you drink alcohol: ? Limit how much you have to:  0-1 drink a day for women who are not pregnant.  0-2 drinks a day for men. ? Know how much alcohol is in your drink. In the [place] one drink equals one 12 oz bottle of beer (321mL), one 5 oz glass of wine (157mL), or one 1 oz glass of hard liquor (92mL).  Do not smoke or use any products that have nicotine or tobacco. If you need help quitting, ask your doctor.  Avoid secondhand smoke.  Do not use drugs. Activity  Try to stay at a healthy weight.  Get at least 30 minutes of exercise on most days, such as: ? Fast walking. ? Biking. ? Swimming.   Medicines  Take over-the-counter and prescription medicines only as told by your doctor.  Avoid taking birth control pills. Talk to your doctor about the risks of taking birth control pills if: ? You are over 43 years old. ? You smoke. ? You get very bad headaches. ? You have had a blood clot. Where to find more information  American Stroke Association: www.strokeassociation.org Get help right away if:  You or a loved one has any signs of a stroke. "BE FAST" is an easy way to remember the warning signs: ? B - Balance. Dizziness, sudden trouble walking, or loss of balance. ? E - Eyes. Trouble seeing or a change in how you see. ? F - Face. Sudden weakness or loss of feeling of the face. The face or eyelid may droop on one side. ? A - Arms. Weakness or loss of feeling in an arm. This happens all of a sudden and most often on one side of the body. ? S - Speech. Sudden trouble speaking, slurred speech, or trouble understanding what people say. ? T - Time. Time to call emergency services. Write down what time symptoms started.  You or a loved one has other signs of a stroke, such as: ? A sudden, very bad  headache with no known cause. ? Feeling like you may vomit (nausea). ? Vomiting. ? A seizure. These symptoms may be an emergency. Get help right away. Call your local emergency services (911 in the [place]  Do not wait to see if the symptoms will go away.  Do not drive yourself to the hospital. Summary  You can help to prevent a stroke by eating healthy, exercising, and not smoking. It also helps to manage any health problems you have.  Do not smoke or use any products that contain nicotine or tobacco.  Get help right away if you or a loved one has any signs of a stroke. This information is not intended to replace advice given to you by your health care provider. Make sure you discuss any questions you have with  your health care provider. Document Revised: 05/24/2020 Document Reviewed: 05/24/2020 Elsevier Patient Education  2021 [place].    Migraine Headache A migraine headache is a very strong throbbing pain on one side or both sides of your head. This type of headache can also cause other symptoms. It can last from 4 hours to 3 days. Talk with your doctor about what things may bring on (trigger) this condition. What are the causes? The exact cause of this condition is not known. This condition may be triggered or caused by:  Drinking alcohol.  Smoking.  Taking medicines, such as: ? Medicine used to treat chest pain (nitroglycerin). ? Birth control pills. ? Estrogen. ? Some blood pressure medicines.  Eating or drinking certain products.  Doing physical activity. Other things that may trigger a migraine headache include:  Having a menstrual period.  Pregnancy.  Hunger.  Stress.  Not getting enough sleep or getting too much sleep.  Weather changes.  Tiredness (fatigue). What increases the risk?  Being 34-58 years old.  Being female.  Having a family history of migraine headaches.  Being Caucasian.  Having depression or anxiety.  Being very  overweight. What are the signs or symptoms?  A throbbing pain. This pain may: ? Happen in any area of the head, such as on one side or both sides. ? Make it hard to do daily activities. ? Get worse with physical activity. ? Get worse around bright lights or loud noises.  Other symptoms may include: ? Feeling sick to your stomach (nauseous). ? Vomiting. ? Dizziness. ? Being sensitive to bright lights, loud noises, or smells.  Before you get a migraine headache, you may get warning signs (an aura). An aura may include: ? Seeing flashing lights or having blind spots. ? Seeing bright spots, halos, or zigzag lines. ? Having tunnel vision or blurred vision. ? Having numbness or a tingling feeling. ? Having trouble talking. ? Having weak muscles.  Some people have symptoms after a migraine headache (postdromal phase), such as: ? Tiredness. ? Trouble thinking (concentrating). How is this treated?  Taking medicines that: ? Relieve pain. ? Relieve the feeling of being sick to your stomach. ? Prevent migraine headaches.  Treatment may also include: ? Having acupuncture. ? Avoiding foods that bring on migraine headaches. ? Learning ways to control your body functions (biofeedback). ? Therapy to help you know and deal with negative thoughts (cognitive behavioral therapy). Follow these instructions at home: Medicines  Take over-the-counter and prescription medicines only as told by your doctor.  Ask your doctor if the medicine prescribed to you: ? Requires you to avoid driving or using heavy machinery. ? Can cause trouble pooping (constipation). You may need to take these steps to prevent or treat trouble pooping:  Drink enough fluid to keep your pee (urine) pale yellow.  Take over-the-counter or prescription medicines.  Eat foods that are high in fiber. These include beans, whole grains, and fresh fruits and vegetables.  Limit foods that are high in fat and sugar. These  include fried or sweet foods. Lifestyle  Do not drink alcohol.  Do not use any products that contain nicotine or tobacco, such as cigarettes, e-cigarettes, and chewing tobacco. If you need help quitting, ask your doctor.  Get at least 8 hours of sleep every night.  Limit and deal with stress. General instructions  Keep a journal to find out what may bring on your migraine headaches. For example, write down: ? What you eat and drink. ?  How much sleep you get. ? Any change in what you eat or drink. ? Any change in your medicines.  If you have a migraine headache: ? Avoid things that make your symptoms worse, such as bright lights. ? It may help to lie down in a dark, quiet room. ? Do not drive or use heavy machinery. ? Ask your doctor what activities are safe for you.  Keep all follow-up visits as told by your doctor. This is important.      Contact a doctor if:  You get a migraine headache that is different or worse than others you have had.  You have more than 15 headache days in one month. Get help right away if:  Your migraine headache gets very bad.  Your migraine headache lasts longer than 72 hours.  You have a fever.  You have a stiff neck.  You have trouble seeing.  Your muscles feel weak or like you cannot control them.  You start to lose your balance a lot.  You start to have trouble walking.  You pass out (faint).  You have a seizure. Summary  A migraine headache is a very strong throbbing pain on one side or both sides of your head. These headaches can also cause other symptoms.  This condition may be treated with medicines and changes to your lifestyle.  Keep a journal to find out what may bring on your migraine headaches.  Contact a doctor if you get a migraine headache that is different or worse than others you have had.  Contact your doctor if you have more than 15 headache days in a month. This information is not intended to replace  advice given to you by your health care provider. Make sure you discuss any questions you have with your health care provider. Document Revised: 02/14/2019 Document Reviewed: 12/05/2018 Elsevier Patient Education  [place].

## 2021-01-27 NOTE — Discharge Instructions (Addendum)
You were seen in the emergency department for left arm numbness.  You had blood work EKG and a CAT scan that did not show any significant abnormalities.  You were seen earlier today with your neurology group and they have set you up for an MRI.  Will be important to keep these appointments.  If you experience worsening neurologic symptoms or new neurologic symptoms you will need to go to a facility where you can get an acute MRI such as [facility].

## 2021-01-27 NOTE — ED Triage Notes (Signed)
L sided hand numbness that radiates to upper arm since last PM.  Denies HA or any other sxs today.

## 2021-01-27 NOTE — Progress Notes (Signed)
Chief Complaint  Patient presents with  . Follow-up    RM 1 with daughter (nancy) Pt started having numbness last night in hand/L arm/neck/foot/aura. Thinks migraines are slightly better, has about 2-3 a month      HISTORY OF PRESENT ILLNESS: 01/27/21 ALL:  She returns today with concerns of left arm numbness and left visual disturbance that started 1 day ago. While playing cards, last night. She felt that her left arm was going to sleep. Numbness seemed to wax and wane and would radiate to the left jaw. She describes a kaleidoscope disturbance of the left eye last night around the same time. Visual disturbance lasted about 10 minutes. Numbness of arm is still present, today. It does not seem to make a difference of what position arm is in. Numbness seems to wax and wane. She is constantly sensitive to light. She wears sun glasses all the time, even at home. She denies head pain. She has taken Excedrin that has not helped.  She denies chest pain, trouble breathing, palpitations, sweating, abdominal or back pain, fever, chills, body aches.   She has been working closely with PCP for uncontrolled HTN. Started on valsartan last week. She continues propranolol LA 60mg  daily (for migraines) and amlodipine 10mg  daily. She is unsure if it has made much difference. BP this morning was 200/86. Her head was not hurting when she took her blood pressure. Usual home readings have been around 160/80's.  Triptans have been avoided d/t history of suspected TIA in 2019 with sudden onset of left arm weakness and facial numbness. Symptoms resolved in about an hour. Imaging unremarkable. She also has history of complicated migraine and migraine with aura. She is usually able to abort headache with Excedrin or ibuprofen. She is concerned that arm numbness has lingered for this long as it usually resolves within 12 hours.    12/16/2020 ALL:  She returns for follow up for complicated migraines. At last visit, we  encouraged her to increase propranolol. She reports taking 20mg  BID. She does feel that migraines are not as painful. She continues to have a visual aura with some sort of headache 1-3 times a month. PCP gave her 800mg  ibuprofen that seems to help. She reports taking 1-2 doses of ibuprofen.   She continues close follow up with PCP for blood pressure management on amlodipine 10mg  daily. She does check on occasion at home but does not remember what readings are. She reports that readings are usually "out of the ballpark". When I ask if today's reading is high for her she reports that it has been this high at home.    09/07/2020 ALL:  Ann Conley is a 87 y.o. female here today for follow up for complicated migraines. She has history of partial vision loss with left arm numbness associated with headache. She was last seen 07/14/2020 by Dr Leta Baptist and advised to increase propranolol to 20mg  BID. She has not increased dose. She has had two migraines since being seen. She was evaluated by the ER on 10/6. CT brain normal. She reports symptoms resolved spontaneously without abortive therapy. She usually takes Excedrin but states that she rarely needs to take it. She reports BP is "always high". Reports it was 144/63 at home yesterday.   HISTORY (copied from Dr Gladstone Lighter note on 07/14/2020)  UPDATE (07/14/20, VRP): Since last visit, doing well except for some more migraine, numbness and vision issues. No alleviating or aggravating factors. Tolerating meds. Has only been taking  propranolol 20mg  daily.     UPDATE (01/07/19, VRP): Since last visit, doing well. Symptoms are resolved. Severity is mild. No alleviating or aggravating factors. Tolerating meds. Having 1-2 HA per month, and controlled with Excedrin migraine.  PRIOR HPI (09/14/18): 87 year old female here for evaluation:  STABBING HEADACHES Patient has had long history since childhood of intermittent sharp stabbing headaches lasting for a few  seconds, on the right side of her head.  These can occur several times per day and several times per month.  Patient looked up online and diagnosed herself with ice pick headaches.  She does not take any medications for these symptoms because they are so short lasting.  They have slightly worsened in the last few months.  Patient has been under more stress with her husband's illness and other family issues.  MIGRAINE TYPE HA Since 2016 patient has had a different type of headache which consists of partial vision loss in the left eye, followed by severe global pounding throbbing headaches associated with photophobia.  No nausea or vomiting.  Headaches can last 2 hours or longer at a time.  She diagnosed herself with "ocular migraines".  She is taken over-the-counter medications with mild relief.  These headaches have been worse than last 6 to 12 months, but have spontaneously resolved since August 2019.  LEFT ARM NUMBNESS 08/10/2018 patient noticed sudden onset of left arm numbness and weakness.  She was having trouble controlling her left arm.  She also noticed some numbness in her left face.  Symptoms lasted for 1 hour.  Patient went to the emergency room for evaluation.  CT of the head and lab testing were unremarkable.  Blood pressure was 175/85.  Apparently "low concern for TIA" was determined and patient was encouraged to follow-up with PCP for evaluation. On 08/23/2018 patient had recurrence of symptoms, with left arm and left face numbness and tingling.  Patient called PCP who recommended ER evaluation but patient declined to go.  HYPERTENSION Patient ran out of her blood pressure medications 2 days ago.  She says that her blood pressure is "always high".  She is on amlodipine 10 mg daily.  Today blood pressure is 170/89.   REVIEW OF SYSTEMS: Out of a complete 14 system review of symptoms, the patient complains only of the following symptoms, headaches, left arm and facial numbness and all  other reviewed systems are negative.    ALLERGIES: Allergies  Allergen Reactions  . Codeine Nausea And Vomiting     HOME MEDICATIONS: Outpatient Medications Prior to Visit  Medication Sig Dispense Refill  . amLODipine (NORVASC) 10 MG tablet Take 1 tablet (10 mg total) by mouth daily. (Patient taking differently: Take 10 mg by mouth daily. Taking 1) 90 tablet 1  . Ascorbic Acid (VITAMIN C PO) Take by mouth.    Marland Kitchen aspirin 81 MG tablet Take 81 mg by mouth every evening.     Marland Kitchen aspirin-acetaminophen-caffeine (EXCEDRIN MIGRAINE) 250-250-65 MG tablet Take by mouth every 6 (six) hours as needed for headache.    . ibuprofen (ADVIL) 800 MG tablet TAKE ONE TABLET BY MOUTH EVERY 8 HOURS AS NEEDED FOR HEADACHE 30 tablet 0  . Magnesium 250 MG TABS Take 250 mg by mouth daily.    . multivitamin-lutein (OCUVITE-LUTEIN) CAPS capsule Take 1 capsule by mouth daily.    Marland Kitchen omeprazole (PRILOSEC) 20 MG capsule Take 1 capsule (20 mg total) by mouth daily. 90 capsule 1  . propranolol ER (INDERAL LA) 60 MG 24 hr capsule Take  1 capsule (60 mg total) by mouth daily. 90 capsule 1  . valsartan (DIOVAN) 40 MG tablet Take 1 tablet (40 mg total) by mouth daily. 30 tablet 3  . vitamin B-12 (CYANOCOBALAMIN) 100 MCG tablet Take 100 mcg by mouth daily.     No facility-administered medications prior to visit.     PAST MEDICAL HISTORY: Past Medical History:  Diagnosis Date  . Anemia   . Blood transfusion complicating pregnancy 2595  . GERD (gastroesophageal reflux disease)   . History of chicken pox   . Hypertension   . Migraine    occular  . TIA (transient ischemic attack)      PAST SURGICAL HISTORY: Past Surgical History:  Procedure Laterality Date  . BREAST BIOPSY    . CHOLECYSTECTOMY    . PARTIAL HYSTERECTOMY       FAMILY HISTORY: Family History  Problem Relation Age of Onset  . Breast cancer Mother   . Heart disease Father   . Heart disease Sister        pacemaker  . Diabetes Sister       SOCIAL HISTORY: Social History   Socioeconomic History  . Marital status: Married    Spouse name: Not on file  . Number of children: Not on file  . Years of education: Not on file  . Highest education level: Not on file  Occupational History  . Not on file  Tobacco Use  . Smoking status: Never Smoker  . Smokeless tobacco: Never Used  Vaping Use  . Vaping Use: Never used  Substance and Sexual Activity  . Alcohol use: Yes    Alcohol/week: 0.0 standard drinks    Comment: wine occ.  . Drug use: No  . Sexual activity: Not on file  Other Topics Concern  . Not on file  Social History Narrative   Lives with husband.  5 children.  Education BA.   Social Determinants of Health   Financial Resource Strain: Not on file  Food Insecurity: Not on file  Transportation Needs: Not on file  Physical Activity: Not on file  Stress: Not on file  Social Connections: Not on file  Intimate Partner Violence: Not on file      PHYSICAL EXAM  Vitals:   01/27/21 1321  BP: (!) 176/79  Pulse: 67  Weight: 217 lb (98.4 kg)  Height: 5' (1.524 m)   Body mass index is 42.38 kg/m.   Generalized: Well developed, in no acute distress   Cardiology: normal rate and rhythm Respiratory: clear to auscultation bilaterally   Neurological examination  Mentation: Alert oriented to time, place, history taking. Follows all commands speech and language fluent, no slurred speech or confusion.   Cranial nerve II-XII: Pupils were equal round reactive to light. Right iris with darker coloring at 6 o'clock (baseline) Extraocular movements were full, visual field were full on confrontational test. Facial sensation and strength were normal.. Head turning and shoulder shrug  were normal and symmetric. Motor: The motor testing reveals 5 over 5 strength of all 4 extremities. Good symmetric motor tone is noted throughout.  Sensory: Sensory testing is intact to soft touch on all 4 extremities. Pinprick and  temperature sensation intact. No evidence of extinction is noted.  Coordination: Cerebellar testing reveals good finger-nose-finger and heel-to-shin bilaterally.  Gait and station: Gait is normal DTR: symmetric and normal in bilateral upper and lower extremities.     DIAGNOSTIC DATA (LABS, IMAGING, TESTING) - I reviewed patient records, labs, notes, testing and imaging  myself where available.  Lab Results  Component Value Date   WBC 8.6 08/11/2020   HGB 12.7 08/11/2020   HCT 39.6 08/11/2020   MCV 88.2 08/11/2020   PLT 290 08/11/2020      Component Value Date/Time   NA 135 08/11/2020 1705   K 4.2 08/11/2020 1705   CL 99 08/11/2020 1705   CO2 25 08/11/2020 1705   GLUCOSE 103 (H) 08/11/2020 1705   BUN 17 08/11/2020 1705   CREATININE 0.46 08/11/2020 1705   CREATININE 0.56 (L) 12/10/2015 1537   CALCIUM 9.3 08/11/2020 1705   PROT 6.6 03/09/2020 1104   ALBUMIN 3.6 03/09/2020 1104   AST 24 03/09/2020 1104   ALT 36 (H) 03/09/2020 1104   ALKPHOS 90 03/09/2020 1104   BILITOT 0.4 03/09/2020 1104   GFRNONAA >60 08/11/2020 1705   GFRAA >60 08/10/2018 1330   Lab Results  Component Value Date   CHOL 172 03/09/2020   HDL 54.20 03/09/2020   LDLCALC 104 (H) 03/09/2020   LDLDIRECT 125.0 09/09/2012   TRIG 70.0 03/09/2020   CHOLHDL 3 03/09/2020   Lab Results  Component Value Date   HGBA1C 5.7 09/12/2019   No results found for: VITAMINB12 Lab Results  Component Value Date   TSH 2.43 03/09/2020      ASSESSMENT AND PLAN  87 y.o. year old female  has a past medical history of Anemia, Blood transfusion complicating pregnancy (6270), GERD (gastroesophageal reflux disease), History of chicken pox, Hypertension, Migraine, and TIA (transient ischemic attack). here with   Migraine with aura and without status migrainosus, not intractable  Essential hypertension  History of transient ischemic attack (TIA) - Plan: MR BRAIN W WO CONTRAST, MR ANGIO HEAD WO CONTRAST, MR ANGIO NECK WO  CONTRAST  Left arm numbness - Plan: MR BRAIN W WO CONTRAST, MR ANGIO HEAD WO CONTRAST, MR ANGIO NECK WO CONTRAST  [place] has had transient left arm numbness radiating to left jaw for about 18 hours. Numbness was preceded by visual disturbance that quickly resolved last night. She has a previous history of complicated migraines with similar symptoms but she is not experiencing any head pain with this event. Blood pressure reportedly elevated at home and was 200/86 this morning. Usually 160's/80's. She recently started valsartan 40mg  and continues amlodipine 10mg  and propranolol LA 60mg  daily. Fortunately, she has a normal neurological examination and no other red flag warnings. She is very concerned as the numbness does not usually linger for longer than 12 hours after taking Excedrin. We have given her Nurtec in the office, today, with minimal response at 30 minutes. She reported numbness was significantly better at 60 minutes post administration. I have discussed case with Dr Leta Baptist. Previous MRI showed moderate chronic microvascular ischemia in 2019. She has had continued hypertension and increased frequency of left arm numbness since workup. We will order MRI of brain and MRA head and neck to rule out any concerns of ischemia contributing to symptoms. She advised to call PCP asap to discuss elevated BP. She will continue asa. LDL 104 in 03/2020. She has been reluctant to start statin. I have given her 3 sample tablets to take home with her and use daily as needed for migraine abortive therapy. Prescription called into pharmacy. She will continue propranolol LA 60mg  daily for prevention. We will avoid triptans due to concerns of previous TIA and complicated migraine with aura. She will continue aspirin. Family educated on signs and symptoms of stroke and advised to seek emergency medical attention  immediately for any new, worsening or unresolved symptoms. May consider EMG/NCS if imaging normal. Healthy  lifestyle habits encouraged. She will follow up with me in 3 months.   I spent 60 minutes of face-to-face and non-face-to-face time with patient.  This included previsit chart review, lab review, study review, order entry, electronic health record documentation, patient education.    Debbora Presto, MSN, FNP-C 01/27/2021, 4:16 PM  [facility] [address] [place], [place] [address] [phone]

## 2021-01-31 ENCOUNTER — Telehealth: Payer: Self-pay | Admitting: Family Medicine

## 2021-01-31 ENCOUNTER — Ambulatory Visit (INDEPENDENT_AMBULATORY_CARE_PROVIDER_SITE_OTHER): Payer: Medicare Other | Admitting: Family Medicine

## 2021-01-31 ENCOUNTER — Encounter: Payer: Self-pay | Admitting: Family Medicine

## 2021-01-31 ENCOUNTER — Other Ambulatory Visit: Payer: Self-pay

## 2021-01-31 VITALS — BP 130/80 | HR 63 | Temp 98.4°F | Resp 17 | Ht 60.0 in | Wt 219.8 lb

## 2021-01-31 DIAGNOSIS — G43109 Migraine with aura, not intractable, without status migrainosus: Secondary | ICD-10-CM | POA: Diagnosis not present

## 2021-01-31 DIAGNOSIS — R200 Anesthesia of skin: Secondary | ICD-10-CM

## 2021-01-31 DIAGNOSIS — I10 Essential (primary) hypertension: Secondary | ICD-10-CM

## 2021-01-31 NOTE — Patient Instructions (Signed)
Follow up as scheduled or as needed When you develop that numbness- take a Nurtec and see if that stops the symptoms Your BP looks MUCH better today!  Continue the Valsartan 40mg  daily in addition to the Amlodipine and Propranolol Call with any questions or concerns Hang in there!!!

## 2021-01-31 NOTE — Telephone Encounter (Signed)
Has the patient had a reaction to the contrast before if not the MRA Neck order should be MRA Neck w/wo contrast.

## 2021-01-31 NOTE — Progress Notes (Signed)
   Subjective:    Patient ID: Ann Conley, female    DOB: December 15, 1933, 87 y.o.   MRN: [id number]  HPI ER f/u- pt went to ER on 3/24 due to L arm numbness that started in her hand and ends in jaw.  Sxs started evening before arrival.  Feels L hand has been intermittently clumsy.  Neuro has pt set up for outpt MRI upcoming.  She has hx of complex migraines and had seen neuro earlier in the day.  Yesterday was getting ready to go out and she again had numbness of L hand and traveled up arm and into face.  Husband did not note any visible changes to her face.  sxs lasted ~20 minutes.  Denies the usual visual changes that typically accompany her numbness and tingling.  Pt reports migraines 'are not as bad' as the numbness and arm sxs currently.  But then she states that the arm sxs are part of the migraine.  She was given Nurtec to take as needed.  HTN- pt started Valsartan 40mg  daily on 01/19/21.  BP is much better controlled today at 130/80.  In ER was 129/52.   Review of Systems For ROS see HPI   This visit occurred during the SARS-CoV-2 public health emergency.  Safety protocols were in place, including screening questions prior to the visit, additional usage of staff PPE, and extensive cleaning of exam room while observing appropriate contact time as indicated for disinfecting solutions.       Objective:   Physical Exam Vitals reviewed.  Constitutional:      General: She is not in acute distress.    Appearance: Normal appearance. She is not ill-appearing.  HENT:     Head: Normocephalic and atraumatic.  Eyes:     Comments: Sunglasses in place  Cardiovascular:     Rate and Rhythm: Normal rate and regular rhythm.     Pulses: Normal pulses.  Pulmonary:     Effort: Pulmonary effort is normal. No respiratory distress.     Breath sounds: Normal breath sounds. No wheezing or rhonchi.  Abdominal:     General: Abdomen is flat. There is no distension.     Palpations: Abdomen is soft.      Tenderness: There is no abdominal tenderness. There is no guarding.  Musculoskeletal:     Cervical back: Normal range of motion and neck supple.     Right lower leg: No edema.     Left lower leg: No edema.  Skin:    General: Skin is warm and dry.  Neurological:     General: No focal deficit present.     Mental Status: She is alert and oriented to person, place, and time.  Psychiatric:        Mood and Affect: Mood normal.        Behavior: Behavior normal.        Thought Content: Thought content normal.           Assessment & Plan:

## 2021-02-01 ENCOUNTER — Other Ambulatory Visit: Payer: Self-pay | Admitting: Family Medicine

## 2021-02-03 NOTE — Assessment & Plan Note (Addendum)
Chronic problem.  Pt was started on Valsartan 40mg  on 01/19/21 and BP is much better controlled today.  Currently asymptomatic.  Reviewed metabolic panel done in ER.  No med changes at this time.

## 2021-02-03 NOTE — Assessment & Plan Note (Addendum)
Deteriorated.  Follows w/ Neurology.  She frequently has to go to ER b/c she isn't sure whether her numbness is due to complicated migraine aura or CVA (she has a history).  Has outpt MRI pending.  Initially reported that HAs were improving but then admits she feels her numbness/clumsiness is consistent w/ her migraines/auras.  If that's the case, her migraines are actually worse rather than better.  Encouraged her at onset of numbness to take Nurtec as directed by Neuro (which she hasn't tried yet) and see if sxs subside.  Pt expressed understanding and is in agreement w/ plan.

## 2021-02-13 ENCOUNTER — Other Ambulatory Visit: Payer: Self-pay

## 2021-02-13 ENCOUNTER — Ambulatory Visit
Admission: RE | Admit: 2021-02-13 | Discharge: 2021-02-13 | Disposition: A | Discharge status: Home / Self Care | Payer: Medicare Other | Source: Ambulatory Visit | Attending: Family Medicine | Admitting: Family Medicine

## 2021-02-13 DIAGNOSIS — Z8673 Personal history of transient ischemic attack (TIA), and cerebral infarction without residual deficits: Secondary | ICD-10-CM

## 2021-02-13 DIAGNOSIS — R200 Anesthesia of skin: Secondary | ICD-10-CM

## 2021-02-13 MED ORDER — GADOBENATE DIMEGLUMINE 529 MG/ML IV SOLN
20.0000 mL | Freq: Once | INTRAVENOUS | Status: AC | PRN
  Administered 2021-02-13: 20 mL via INTRAVENOUS

## 2021-02-16 ENCOUNTER — Telehealth: Payer: Self-pay | Admitting: *Deleted

## 2021-02-16 NOTE — Telephone Encounter (Signed)
I called pt with MRI/MRA results.  She appreciated call back.  SY

## 2021-03-07 ENCOUNTER — Ambulatory Visit: Payer: Medicare HMO | Admitting: Family Medicine

## 2021-03-07 ENCOUNTER — Telehealth: Payer: Self-pay

## 2021-03-07 ENCOUNTER — Encounter: Payer: Self-pay | Admitting: Family Medicine

## 2021-03-07 VITALS — BP 152/73 | HR 62 | Ht 59.0 in | Wt 223.0 lb

## 2021-03-07 DIAGNOSIS — W19XXXA Unspecified fall, initial encounter: Secondary | ICD-10-CM

## 2021-03-07 DIAGNOSIS — G43109 Migraine with aura, not intractable, without status migrainosus: Secondary | ICD-10-CM | POA: Diagnosis not present

## 2021-03-07 DIAGNOSIS — I10 Essential (primary) hypertension: Secondary | ICD-10-CM | POA: Diagnosis not present

## 2021-03-07 MED ORDER — NURTEC 75 MG PO TBDP: 75.0000 mg | ORAL_TABLET | Freq: Every day | ORAL | 11 refills | Status: DC | PRN

## 2021-03-07 NOTE — Telephone Encounter (Signed)
I have submitted a PA request for Nurtec on CMM, Key: [id number]  Awaiting determination

## 2021-03-07 NOTE — Progress Notes (Signed)
Chief Complaint  Patient presents with  . Follow-up    Rm 1 alone Pt is well, has about 2-3 migraines a month and havn't been as bad.      HISTORY OF PRESENT ILLNESS: 03/07/21 ALL:  She returns for follow up for complicated migraines. MRI/MRA unremarkable. Nurtec has been helpful for abortive therapy. BP has been much better with Dr Birdie Riddle. Ann Conley states it has been elevated some at home. She has had 2-3 headaches over the past month but reports they were not bad and aborted with Nurtec. She has not picked up prescription from pharmacy.   She does report having a fall last week. She was walking through her doorway and her foot got caught on the entryway. She hit her head up on the wall in front of her. No other injuries. She reports that her forehead has been a little sore but she is doing well, otherwise. No changes in vision. No confusion. No worsening headaches.    01/27/2021 ALL:  She returns today with concerns of left arm numbness and left visual disturbance that started 1 day ago. While playing cards, last night. She felt that her left arm was going to sleep. Numbness seemed to wax and wane and would radiate to the left jaw. She describes a kaleidoscope disturbance of the left eye last night around the same time. Visual disturbance lasted about 10 minutes. Numbness of arm is still present, today. It does not seem to make a difference of what position arm is in. Numbness seems to wax and wane. She is constantly sensitive to light. She wears sun glasses all the time, even at home. She denies head pain. She has taken Excedrin that has not helped.  She denies chest pain, trouble breathing, palpitations, sweating, abdominal or back pain, fever, chills, body aches.   She has been working closely with PCP for uncontrolled HTN. Started on valsartan last week. She continues propranolol LA 60mg  daily (for migraines) and amlodipine 10mg  daily. She is unsure if it has made much difference. BP  this morning was 200/86. Her head was not hurting when she took her blood pressure. Usual home readings have been around 160/80's.  Triptans have been avoided d/t history of suspected TIA in 2019 with sudden onset of left arm weakness and facial numbness. Symptoms resolved in about an hour. Imaging unremarkable. She also has history of complicated migraine and migraine with aura. She is usually able to abort headache with Excedrin or ibuprofen. She is concerned that arm numbness has lingered for this long as it usually resolves within 12 hours.    12/16/2020 ALL:  She returns for follow up for complicated migraines. At last visit, we encouraged her to increase propranolol. She reports taking 20mg  BID. She does feel that migraines are not as painful. She continues to have a visual aura with some sort of headache 1-3 times a month. PCP gave her 800mg  ibuprofen that seems to help. She reports taking 1-2 doses of ibuprofen.   She continues close follow up with PCP for blood pressure management on amlodipine 10mg  daily. She does check on occasion at home but does not remember what readings are. She reports that readings are usually "out of the ballpark". When I ask if today's reading is high for her she reports that it has been this high at home.    09/07/2020 ALL:  Ann Conley is a 87 y.o. female here today for follow up for complicated migraines. She has history  of partial vision loss with left arm numbness associated with headache. She was last seen 07/14/2020 by Dr Leta Baptist and advised to increase propranolol to 20mg  BID. She has not increased dose. She has had two migraines since being seen. She was evaluated by the ER on 10/6. CT brain normal. She reports symptoms resolved spontaneously without abortive therapy. She usually takes Excedrin but states that she rarely needs to take it. She reports BP is "always high". Reports it was 144/63 at home yesterday.   HISTORY (copied from Dr Gladstone Lighter note  on 07/14/2020)  UPDATE (07/14/20, VRP): Since last visit, doing well except for some more migraine, numbness and vision issues. No alleviating or aggravating factors. Tolerating meds. Has only been taking propranolol 20mg  daily.     UPDATE (01/07/19, VRP): Since last visit, doing well. Symptoms are resolved. Severity is mild. No alleviating or aggravating factors. Tolerating meds. Having 1-2 HA per month, and controlled with Excedrin migraine.  PRIOR HPI (09/14/18): 87 year old female here for evaluation:  STABBING HEADACHES Patient has had long history since childhood of intermittent sharp stabbing headaches lasting for a few seconds, on the right side of her head.  These can occur several times per day and several times per month.  Patient looked up online and diagnosed herself with ice pick headaches.  She does not take any medications for these symptoms because they are so short lasting.  They have slightly worsened in the last few months.  Patient has been under more stress with her husband's illness and other family issues.  MIGRAINE TYPE HA Since 2016 patient has had a different type of headache which consists of partial vision loss in the left eye, followed by severe global pounding throbbing headaches associated with photophobia.  No nausea or vomiting.  Headaches can last 2 hours or longer at a time.  She diagnosed herself with "ocular migraines".  She is taken over-the-counter medications with mild relief.  These headaches have been worse than last 6 to 12 months, but have spontaneously resolved since August 2019.  LEFT ARM NUMBNESS 08/10/2018 patient noticed sudden onset of left arm numbness and weakness.  She was having trouble controlling her left arm.  She also noticed some numbness in her left face.  Symptoms lasted for 1 hour.  Patient went to the emergency room for evaluation.  CT of the head and lab testing were unremarkable.  Blood pressure was 175/85.  Apparently "low concern for  TIA" was determined and patient was encouraged to follow-up with PCP for evaluation. On 08/23/2018 patient had recurrence of symptoms, with left arm and left face numbness and tingling.  Patient called PCP who recommended ER evaluation but patient declined to go.  HYPERTENSION Patient ran out of her blood pressure medications 2 days ago.  She says that her blood pressure is "always high".  She is on amlodipine 10 mg daily.  Today blood pressure is 170/89.   REVIEW OF SYSTEMS: Out of a complete 14 system review of symptoms, the patient complains only of the following symptoms, headaches, left arm and facial numbness and all other reviewed systems are negative.    ALLERGIES: Allergies  Allergen Reactions  . Codeine Nausea And Vomiting     HOME MEDICATIONS: Outpatient Medications Prior to Visit  Medication Sig Dispense Refill  . amLODipine (NORVASC) 10 MG tablet TAKE ONE TABLET BY MOUTH DAILY 90 tablet 1  . Ascorbic Acid (VITAMIN C PO) Take by mouth.    Marland Kitchen aspirin 81 MG tablet Take 81  mg by mouth every evening.     Marland Kitchen aspirin-acetaminophen-caffeine (EXCEDRIN MIGRAINE) 250-250-65 MG tablet Take by mouth every 6 (six) hours as needed for headache.    . ibuprofen (ADVIL) 800 MG tablet TAKE ONE TABLET BY MOUTH EVERY 8 HOURS AS NEEDED FOR HEADACHE 30 tablet 0  . Magnesium 250 MG TABS Take 250 mg by mouth daily.    . multivitamin-lutein (OCUVITE-LUTEIN) CAPS capsule Take 1 capsule by mouth daily.    Marland Kitchen omeprazole (PRILOSEC) 20 MG capsule Take 1 capsule (20 mg total) by mouth daily. 90 capsule 1  . propranolol ER (INDERAL LA) 60 MG 24 hr capsule Take 1 capsule (60 mg total) by mouth daily. 90 capsule 1  . valsartan (DIOVAN) 40 MG tablet Take 1 tablet (40 mg total) by mouth daily. 30 tablet 3  . vitamin B-12 (CYANOCOBALAMIN) 100 MCG tablet Take 100 mcg by mouth daily.    . Rimegepant Sulfate (NURTEC) 75 MG TBDP Take 75 mg by mouth daily as needed (take for abortive therapy of migraine, no more  than 1 tablet in 24 hours or 10 per month). 8 tablet 11  . Rimegepant Sulfate (NURTEC) 75 MG TBDP Take 75 mg by mouth daily as needed (take for abortive therapy of migraine, no more than 1 tablet in 24 hours or 10 per month). (Patient not taking: Reported on 03/07/2021) 4 tablet 0   No facility-administered medications prior to visit.     PAST MEDICAL HISTORY: Past Medical History:  Diagnosis Date  . Anemia   . Blood transfusion complicating pregnancy [id number]  . GERD (gastroesophageal reflux disease)   . History of chicken pox   . Hypertension   . Migraine    occular  . TIA (transient ischemic attack)      PAST SURGICAL HISTORY: Past Surgical History:  Procedure Laterality Date  . BREAST BIOPSY    . CHOLECYSTECTOMY    . PARTIAL HYSTERECTOMY       FAMILY HISTORY: Family History  Problem Relation Age of Onset  . Breast cancer Mother   . Heart disease Father   . Heart disease Sister        pacemaker  . Diabetes Sister      SOCIAL HISTORY: Social History   Socioeconomic History  . Marital status: Married    Spouse name: Not on file  . Number of children: Not on file  . Years of education: Not on file  . Highest education level: Not on file  Occupational History  . Not on file  Tobacco Use  . Smoking status: Never Smoker  . Smokeless tobacco: Never Used  Vaping Use  . Vaping Use: Never used  Substance and Sexual Activity  . Alcohol use: Yes    Alcohol/week: 0.0 standard drinks    Comment: wine occ.  . Drug use: No  . Sexual activity: Not on file  Other Topics Concern  . Not on file  Social History Narrative   Lives with husband.  5 children.  Education BA.   Social Determinants of Health   Financial Resource Strain: Not on file  Food Insecurity: Not on file  Transportation Needs: Not on file  Physical Activity: Not on file  Stress: Not on file  Social Connections: Not on file  Intimate Partner Violence: Not on file      PHYSICAL EXAM  Vitals:    03/07/21 1427  BP: (!) 152/73  Pulse: 62  Weight: 223 lb (101.2 kg)  Height: 4\' 11"  (1.499 m)  Body mass index is 45.04 kg/m.   Generalized: Well developed, in no acute distress   Cardiology: normal rate and rhythm Respiratory: clear to auscultation bilaterally   Neurological examination  Mentation: Alert oriented to time, place, history taking. Follows all commands speech and language fluent, no slurred speech or confusion.   Cranial nerve II-XII: Pupils were equal round reactive to light. Right iris with darker coloring at 6 o'clock (baseline) Extraocular movements were full, visual field were full on confrontational test. Facial sensation and strength were normal.. Head turning and shoulder shrug  were normal and symmetric. Motor: The motor testing reveals 5 over 5 strength of all 4 extremities. Good symmetric motor tone is noted throughout.  Sensory: Sensory testing is intact to soft touch on all 4 extremities. Pinprick and temperature sensation intact. No evidence of extinction is noted.  Coordination: Cerebellar testing reveals good finger-nose-finger and heel-to-shin bilaterally.  Gait and station: Gait is normal DTR: symmetric and normal in bilateral upper and lower extremities.     DIAGNOSTIC DATA (LABS, IMAGING, TESTING) - I reviewed patient records, labs, notes, testing and imaging myself where available.  Lab Results  Component Value Date   WBC 7.0 01/27/2021   HGB 13.1 01/27/2021   HCT 39.9 01/27/2021   MCV 87.9 01/27/2021   PLT 268 01/27/2021      Component Value Date/Time   NA 135 01/27/2021 1757   K 3.7 01/27/2021 1757   CL 99 01/27/2021 1757   CO2 27 01/27/2021 1757   GLUCOSE 117 (H) 01/27/2021 1757   BUN 13 01/27/2021 1757   CREATININE 0.56 01/27/2021 1757   CREATININE 0.56 (L) 12/10/2015 1537   CALCIUM 9.2 01/27/2021 1757   PROT 7.5 01/27/2021 1757   ALBUMIN 3.9 01/27/2021 1757   AST 21 01/27/2021 1757   ALT 20 01/27/2021 1757   ALKPHOS 82  01/27/2021 1757   BILITOT 0.3 01/27/2021 1757   GFRNONAA >60 01/27/2021 1757   GFRAA >60 08/10/2018 1330   Lab Results  Component Value Date   CHOL 172 03/09/2020   HDL 54.20 03/09/2020   LDLCALC 104 (H) 03/09/2020   LDLDIRECT 125.0 09/09/2012   TRIG 70.0 03/09/2020   CHOLHDL 3 03/09/2020   Lab Results  Component Value Date   HGBA1C 5.7 09/12/2019   No results found for: VITAMINB12 Lab Results  Component Value Date   TSH 2.43 03/09/2020      ASSESSMENT AND PLAN  87 y.o. year old female  has a past medical history of Anemia, Blood transfusion complicating pregnancy (4580), GERD (gastroesophageal reflux disease), History of chicken pox, Hypertension, Migraine, and TIA (transient ischemic attack). here with   Migraine with aura and without status migrainosus, not intractable  Essential hypertension  Fall, initial encounter  Ann Conley reports that headaches have been less frequent and less severe over the past month. Nurtec has worked well for abortive therapy. May consider CGRP injection for prevention if frequency increases. BP is a little elevated today but is much better than previous readings. BP was normal at last follow up with PCP. She will continue close follow up with Dr Birdie Riddle. We have discussed her recent fall. Fortunately, she is doing well outside some mild tenderness of her forehead. Neuro exam intact. We have discussed considering CT scan but I do not feel it is needed at this time. She was given strict follow up precautions should symptoms worsen.  She will follow up with me in 6 months.    Debbora Presto, MSN, FNP-C 03/07/2021, 3:06  PM  [facility] [address] [place], [place] [address] [phone]

## 2021-03-07 NOTE — Patient Instructions (Signed)
Below is our plan:  We will continue Nurtec. I have called in a new prescription for you to [facility] in [facility].   Please make sure you are staying well hydrated. I recommend 50-60 ounces daily. Well balanced diet and regular exercise encouraged. Consistent sleep schedule with 6-8 hours recommended.   Please continue follow up with care team as directed.   Follow up with me in 6 months   You may receive a survey regarding today's visit. I encourage you to leave honest feed back as I do use this information to improve patient care. Thank you for seeing me today!     Fall Prevention in the Home, Adult Falls can cause injuries and can happen to people of all ages. There are many things you can do to make your home safe and to help prevent falls. Ask for help when making these changes. What actions can I take to prevent falls? General Instructions  Use good lighting in all rooms. Replace any light bulbs that burn out.  Turn on the lights in dark areas. Use night-lights.  Keep items that you use often in easy-to-reach places. Lower the shelves around your home if needed.  Set up your furniture so you have a clear path. Avoid moving your furniture around.  Do not have throw rugs or other things on the floor that can make you trip.  Avoid walking on wet floors.  If any of your floors are uneven, fix them.  Add color or contrast paint or tape to clearly mark and help you see: ? Grab bars or handrails. ? First and last steps of staircases. ? Where the edge of each step is.  If you use a stepladder: ? Make sure that it is fully opened. Do not climb a closed stepladder. ? Make sure the sides of the stepladder are locked in place. ? Ask someone to hold the stepladder while you use it.  Know where your pets are when moving through your home. What can I do in the bathroom?  Keep the floor dry. Clean up any water on the floor right away.  Remove soap buildup in the tub or  shower.  Use nonskid mats or decals on the floor of the tub or shower.  Attach bath mats securely with double-sided, nonslip rug tape.  If you need to sit down in the shower, use a plastic, nonslip stool.  Install grab bars by the toilet and in the tub and shower. Do not use towel bars as grab bars.      What can I do in the bedroom?  Make sure that you have a light by your bed that is easy to reach.  Do not use any sheets or blankets for your bed that hang to the floor.  Have a firm chair with side arms that you can use for support when you get dressed. What can I do in the kitchen?  Clean up any spills right away.  If you need to reach something above you, use a step stool with a grab bar.  Keep electrical cords out of the way.  Do not use floor polish or wax that makes floors slippery. What can I do with my stairs?  Do not leave any items on the stairs.  Make sure that you have a light switch at the top and the bottom of the stairs.  Make sure that there are handrails on both sides of the stairs. Fix handrails that are broken or  loose.  Install nonslip stair treads on all your stairs.  Avoid having throw rugs at the top or bottom of the stairs.  Choose a carpet that does not hide the edge of the steps on the stairs.  Check carpeting to make sure that it is firmly attached to the stairs. Fix carpet that is loose or worn. What can I do on the outside of my home?  Use bright outdoor lighting.  Fix the edges of walkways and driveways and fix any cracks.  Remove anything that might make you trip as you walk through a door, such as a raised step or threshold.  Trim any bushes or trees on paths to your home.  Check to see if handrails are loose or broken and that both sides of all steps have handrails.  Install guardrails along the edges of any raised decks and porches.  Clear paths of anything that can make you trip, such as tools or rocks.  Have leaves, snow,  or ice cleared regularly.  Use sand or salt on paths during winter.  Clean up any spills in your garage right away. This includes grease or oil spills. What other actions can I take?  Wear shoes that: ? Have a low heel. Do not wear high heels. ? Have rubber bottoms. ? Feel good on your feet and fit well. ? Are closed at the toe. Do not wear open-toe sandals.  Use tools that help you move around if needed. These include: ? Canes. ? Walkers. ? Scooters. ? Crutches.  Review your medicines with your doctor. Some medicines can make you feel dizzy. This can increase your chance of falling. Ask your doctor what else you can do to help prevent falls. Where to find more information  Centers for Disease Control and Prevention, STEADI: http://www.wolf.info/  National Institute on Aging: http://kim-miller.com/ Contact a doctor if:  You are afraid of falling at home.  You feel weak, drowsy, or dizzy at home.  You fall at home. Summary  There are many simple things that you can do to make your home safe and to help prevent falls.  Ways to make your home safe include removing things that can make you trip and installing grab bars in the bathroom.  Ask for help when making these changes in your home. This information is not intended to replace advice given to you by your health care provider. Make sure you discuss any questions you have with your health care provider. Document Revised: 05/26/2020 Document Reviewed: 05/26/2020 Elsevier Patient Education  2021 [place].    Migraine Headache A migraine headache is a very strong throbbing pain on one side or both sides of your head. This type of headache can also cause other symptoms. It can last from 4 hours to 3 days. Talk with your doctor about what things may bring on (trigger) this condition. What are the causes? The exact cause of this condition is not known. This condition may be triggered or caused by:  Drinking  alcohol.  Smoking.  Taking medicines, such as: ? Medicine used to treat chest pain (nitroglycerin). ? Birth control pills. ? Estrogen. ? Some blood pressure medicines.  Eating or drinking certain products.  Doing physical activity. Other things that may trigger a migraine headache include:  Having a menstrual period.  Pregnancy.  Hunger.  Stress.  Not getting enough sleep or getting too much sleep.  Weather changes.  Tiredness (fatigue). What increases the risk?  Being 23-35 years old.  Being  female.  Having a family history of migraine headaches.  Being Caucasian.  Having depression or anxiety.  Being very overweight. What are the signs or symptoms?  A throbbing pain. This pain may: ? Happen in any area of the head, such as on one side or both sides. ? Make it hard to do daily activities. ? Get worse with physical activity. ? Get worse around bright lights or loud noises.  Other symptoms may include: ? Feeling sick to your stomach (nauseous). ? Vomiting. ? Dizziness. ? Being sensitive to bright lights, loud noises, or smells.  Before you get a migraine headache, you may get warning signs (an aura). An aura may include: ? Seeing flashing lights or having blind spots. ? Seeing bright spots, halos, or zigzag lines. ? Having tunnel vision or blurred vision. ? Having numbness or a tingling feeling. ? Having trouble talking. ? Having weak muscles.  Some people have symptoms after a migraine headache (postdromal phase), such as: ? Tiredness. ? Trouble thinking (concentrating). How is this treated?  Taking medicines that: ? Relieve pain. ? Relieve the feeling of being sick to your stomach. ? Prevent migraine headaches.  Treatment may also include: ? Having acupuncture. ? Avoiding foods that bring on migraine headaches. ? Learning ways to control your body functions (biofeedback). ? Therapy to help you know and deal with negative thoughts  (cognitive behavioral therapy). Follow these instructions at home: Medicines  Take over-the-counter and prescription medicines only as told by your doctor.  Ask your doctor if the medicine prescribed to you: ? Requires you to avoid driving or using heavy machinery. ? Can cause trouble pooping (constipation). You may need to take these steps to prevent or treat trouble pooping:  Drink enough fluid to keep your pee (urine) pale yellow.  Take over-the-counter or prescription medicines.  Eat foods that are high in fiber. These include beans, whole grains, and fresh fruits and vegetables.  Limit foods that are high in fat and sugar. These include fried or sweet foods. Lifestyle  Do not drink alcohol.  Do not use any products that contain nicotine or tobacco, such as cigarettes, e-cigarettes, and chewing tobacco. If you need help quitting, ask your doctor.  Get at least 8 hours of sleep every night.  Limit and deal with stress. General instructions  Keep a journal to find out what may bring on your migraine headaches. For example, write down: ? What you eat and drink. ? How much sleep you get. ? Any change in what you eat or drink. ? Any change in your medicines.  If you have a migraine headache: ? Avoid things that make your symptoms worse, such as bright lights. ? It may help to lie down in a dark, quiet room. ? Do not drive or use heavy machinery. ? Ask your doctor what activities are safe for you.  Keep all follow-up visits as told by your doctor. This is important.      Contact a doctor if:  You get a migraine headache that is different or worse than others you have had.  You have more than 15 headache days in one month. Get help right away if:  Your migraine headache gets very bad.  Your migraine headache lasts longer than 72 hours.  You have a fever.  You have a stiff neck.  You have trouble seeing.  Your muscles feel weak or like you cannot control  them.  You start to lose your balance a lot.  You  start to have trouble walking.  You pass out (faint).  You have a seizure. Summary  A migraine headache is a very strong throbbing pain on one side or both sides of your head. These headaches can also cause other symptoms.  This condition may be treated with medicines and changes to your lifestyle.  Keep a journal to find out what may bring on your migraine headaches.  Contact a doctor if you get a migraine headache that is different or worse than others you have had.  Contact your doctor if you have more than 15 headache days in a month. This information is not intended to replace advice given to you by your health care provider. Make sure you discuss any questions you have with your health care provider. Document Revised: 02/14/2019 Document Reviewed: 12/05/2018 Elsevier Patient Education  [employer].

## 2021-03-08 NOTE — Telephone Encounter (Signed)
Ann Conley from [employer] called to inform CMA that the pts medication has been approved from 03/07/21-03/222. If any questions please call [phone] Option 5

## 2021-03-17 ENCOUNTER — Other Ambulatory Visit: Payer: Self-pay

## 2021-03-17 ENCOUNTER — Ambulatory Visit (INDEPENDENT_AMBULATORY_CARE_PROVIDER_SITE_OTHER): Payer: Medicare Other | Admitting: Family Medicine

## 2021-03-17 ENCOUNTER — Encounter: Payer: Self-pay | Admitting: Family Medicine

## 2021-03-17 VITALS — BP 132/70 | HR 65 | Temp 98.7°F | Resp 17 | Ht 59.0 in | Wt 223.8 lb

## 2021-03-17 DIAGNOSIS — Z1231 Encounter for screening mammogram for malignant neoplasm of breast: Secondary | ICD-10-CM

## 2021-03-17 DIAGNOSIS — Z0000 Encounter for general adult medical examination without abnormal findings: Secondary | ICD-10-CM | POA: Diagnosis not present

## 2021-03-17 LAB — HEPATIC FUNCTION PANEL
ALT: 12 U/L (ref 0–35)
AST: 15 U/L (ref 0–37)
Albumin: 4.2 g/dL (ref 3.5–5.2)
Alkaline Phosphatase: 85 U/L (ref 39–117)
Bilirubin, Direct: 0 mg/dL (ref 0.0–0.3)
Total Bilirubin: 0.4 mg/dL (ref 0.2–1.2)
Total Protein: 7.1 g/dL (ref 6.0–8.3)

## 2021-03-17 LAB — CBC WITH DIFFERENTIAL/PLATELET
Basophils Absolute: 0.1 10*3/uL (ref 0.0–0.1)
Basophils Relative: 1 % (ref 0.0–3.0)
Eosinophils Absolute: 0.2 10*3/uL (ref 0.0–0.7)
Eosinophils Relative: 2.6 % (ref 0.0–5.0)
HCT: 37.2 % (ref 36.0–46.0)
Hemoglobin: 12.5 g/dL (ref 12.0–15.0)
Lymphocytes Relative: 22.2 % (ref 12.0–46.0)
Lymphs Abs: 1.5 10*3/uL (ref 0.7–4.0)
MCHC: 33.7 g/dL (ref 30.0–36.0)
MCV: 87.2 fl (ref 78.0–100.0)
Monocytes Absolute: 0.5 10*3/uL (ref 0.1–1.0)
Monocytes Relative: 7.9 % (ref 3.0–12.0)
Neutro Abs: 4.4 10*3/uL (ref 1.4–7.7)
Neutrophils Relative %: 66.3 % (ref 43.0–77.0)
Platelets: 247 10*3/uL (ref 150.0–400.0)
RBC: 4.26 Mil/uL (ref 3.87–5.11)
RDW: 15 % (ref 11.5–15.5)
WBC: 6.6 10*3/uL (ref 4.0–10.5)

## 2021-03-17 LAB — LIPID PANEL
Cholesterol: 181 mg/dL (ref 0–200)
HDL: 62.9 mg/dL (ref >39.00)
LDL Cholesterol: 82 mg/dL (ref 0–99)
NonHDL: 117.68
Total CHOL/HDL Ratio: 3
Triglycerides: 178 mg/dL — ABNORMAL HIGH (ref 0.0–149.0)
VLDL: 35.6 mg/dL (ref 0.0–40.0)

## 2021-03-17 LAB — BASIC METABOLIC PANEL
BUN: 20 mg/dL (ref 6–23)
CO2: 31 mEq/L (ref 19–32)
Calcium: 9.5 mg/dL (ref 8.4–10.5)
Chloride: 100 mEq/L (ref 96–112)
Creatinine, Ser: 0.56 mg/dL (ref 0.40–1.20)
GFR: 83.81 mL/min (ref >60.00)
Glucose, Bld: 92 mg/dL (ref 70–99)
Potassium: 4.5 mEq/L (ref 3.5–5.1)
Sodium: 137 mEq/L (ref 135–145)

## 2021-03-17 LAB — TSH: TSH: 2.16 u[IU]/mL (ref 0.35–4.50)

## 2021-03-17 MED ORDER — PROPRANOLOL HCL ER 60 MG PO CP24: 60.0000 mg | ORAL_CAPSULE | Freq: Every day | ORAL | 1 refills | Status: DC

## 2021-03-17 NOTE — Assessment & Plan Note (Signed)
Pt's BMI is 45.20  Encouraged healthy diet and exercise as pt is able but she needs to be very aware and careful as she has had a fall.  Check labs to risk stratify.  Will follow.

## 2021-03-17 NOTE — Assessment & Plan Note (Signed)
Pt's PE unchanged from previous and WNL w/ exception of obesity.  UTD on COVID, Tdap.  Mammo ordered.  No longer having colon cancer screening.  Check labs.  Anticipatory guidance provided.

## 2021-03-17 NOTE — Progress Notes (Signed)
   Subjective:    Patient ID: Ann Conley, female    DOB: 1934-08-11, 87 y.o.   MRN: [id number]  HPI CPE- UTD on pneumonia vaccines, Tdap, COVID.  No longer doing colon cancer screening.  Due for mammo.  Reviewed past medical, surgical, family and social histories.   Patient Care Team    Relationship Specialty Notifications Start End  Midge Minium, MD PCP - General Family Medicine  02/27/12    Comment: Noah Charon, Pearletha Forge, MD Consulting Physician Cardiology  09/11/19   Penni Bombard, MD Consulting Physician Neurology  09/11/19   Calvert Cantor, MD Consulting Physician Ophthalmology  09/11/19       Review of Systems Patient reports no vision/ hearing changes, adenopathy,fever, weight change,  persistant/recurrent hoarseness , swallowing issues, chest pain, palpitations, edema, persistant/recurrent cough, hemoptysis, dyspnea (rest/exertional/paroxysmal nocturnal), gastrointestinal bleeding (melena, rectal bleeding), abdominal pain, significant heartburn, bowel changes, GU symptoms (dysuria, hematuria, incontinence), Gyn symptoms (abnormal  bleeding, pain),  syncope, focal weakness, memory loss, numbness & tingling, skin/hair/nail changes, abnormal bruising or bleeding, anxiety, or depression.   This visit occurred during the SARS-CoV-2 public health emergency.  Safety protocols were in place, including screening questions prior to the visit, additional usage of staff PPE, and extensive cleaning of exam room while observing appropriate contact time as indicated for disinfecting solutions.       Objective:   Physical Exam General Appearance:    Alert, cooperative, no distress, appears stated age  Head:    Normocephalic, without obvious abnormality, atraumatic  Eyes:    PERRL, conjunctiva/corneas clear, EOM's intact, fundi    benign, both eyes  Ears:    Normal TM's and external ear canals, both ears  Nose:   Deferred due to COVID  Throat:   Neck:   Supple, symmetrical,  trachea midline, no adenopathy;    Thyroid: no enlargement/tenderness/nodules  Back:     Symmetric, no curvature, ROM normal, no CVA tenderness  Lungs:     Clear to auscultation bilaterally, respirations unlabored  Chest Wall:    No tenderness or deformity   Heart:    Regular rate and rhythm, S1 and S2 normal, no murmur, rub   or gallop  Breast Exam:    Deferred to mammo  Abdomen:     Soft, non-tender, bowel sounds active all four quadrants,    no masses, no organomegaly  Genitalia:    Deferred  Rectal:    Extremities:   Extremities normal, atraumatic, no cyanosis or edema  Pulses:   2+ and symmetric all extremities  Skin:   Skin color, texture, turgor normal, no rashes or lesions  Lymph nodes:   Cervical, supraclavicular, and axillary nodes normal  Neurologic:   CNII-XII intact, normal strength, sensation and reflexes    throughout          Assessment & Plan:

## 2021-03-17 NOTE — Patient Instructions (Addendum)
Follow up in 6 months to recheck BP We'll notify you of your lab results and make any changes if needed We'll call you to schedule your mammogram Continue to work on healthy diet and exercise as you are able Call with any questions or concerns Have a great summer!!!

## 2021-03-21 ENCOUNTER — Other Ambulatory Visit: Payer: Self-pay

## 2021-03-21 ENCOUNTER — Telehealth: Payer: Self-pay

## 2021-03-21 DIAGNOSIS — G43109 Migraine with aura, not intractable, without status migrainosus: Secondary | ICD-10-CM

## 2021-03-21 MED ORDER — IBUPROFEN 800 MG PO TABS: ORAL_TABLET | ORAL | 0 refills | Status: DC

## 2021-03-21 NOTE — Telephone Encounter (Signed)
Sent Rx to patient pharmacy per pcp

## 2021-03-21 NOTE — Telephone Encounter (Signed)
Since it took her 3 months to use the last prescription, we can refill for another #30, no refills

## 2021-03-21 NOTE — Telephone Encounter (Signed)
Requesting:Ibprofen 800mg  Contract: UDS: Last Visit:03/17/21 Next Visit:n/a Last Refill:12/07/20 30 tabs 0 refills  Please Advise

## 2021-03-29 NOTE — Progress Notes (Signed)
I reviewed note and agree with plan.   Penni Bombard, MD 4/35/6861, 6:83 PM Certified in Neurology, Neurophysiology and Neuroimaging  [facility] [address] [place], [place] [address] [phone]

## 2021-04-26 DIAGNOSIS — Z1231 Encounter for screening mammogram for malignant neoplasm of breast: Secondary | ICD-10-CM | POA: Diagnosis not present

## 2021-04-26 LAB — HM MAMMOGRAPHY

## 2021-04-28 ENCOUNTER — Other Ambulatory Visit: Payer: Self-pay

## 2021-04-28 ENCOUNTER — Ambulatory Visit (INDEPENDENT_AMBULATORY_CARE_PROVIDER_SITE_OTHER): Payer: Medicare Other | Admitting: Family Medicine

## 2021-04-28 ENCOUNTER — Encounter: Payer: Self-pay | Admitting: Family Medicine

## 2021-04-28 VITALS — BP 120/70 | HR 68 | Temp 98.4°F | Resp 20 | Ht 59.0 in | Wt 224.0 lb

## 2021-04-28 DIAGNOSIS — M7989 Other specified soft tissue disorders: Secondary | ICD-10-CM

## 2021-04-28 MED ORDER — FUROSEMIDE 20 MG PO TABS: 20.0000 mg | ORAL_TABLET | Freq: Every day | ORAL | 3 refills | Status: DC

## 2021-04-28 NOTE — Patient Instructions (Signed)
Follow up as needed or as scheduled The swelling in your feet is due to heat, position, and salt Try and limit your salt and increase your water intake Keep your feet elevated when you are seated- to fight gravity Use the water pill as needed for days you have a lot of swelling Call with any questions or concerns- particularly if things aren't improve Hang in there!!!

## 2021-04-28 NOTE — Progress Notes (Signed)
   Subjective:    Patient ID: Ann Conley, female    DOB: [birth year], 87 y.o.   MRN: [id number]  HPI Foot swelling- pt reports swelling is localized to her feet and doesn't extend up her legs.  'they just get tight'.  Pt keeps legs up while sitting.  Swelling started ~1 month ago.  Pt has bacon daily but otherwise denies high sodium diet.  Pt reports good water intake.  Denies SOB.  Swelling improves overnight.  She does report rings are tight.     Review of Systems For ROS see HPI   This visit occurred during the SARS-CoV-2 public health emergency.  Safety protocols were in place, including screening questions prior to the visit, additional usage of staff PPE, and extensive cleaning of exam room while observing appropriate contact time as indicated for disinfecting solutions.      Objective:   Physical Exam Vitals reviewed.  Constitutional:      General: She is not in acute distress.    Appearance: Normal appearance. She is obese. She is not ill-appearing.  HENT:     Head: Normocephalic and atraumatic.  Cardiovascular:     Rate and Rhythm: Normal rate and regular rhythm.     Pulses: Normal pulses.  Pulmonary:     Effort: Pulmonary effort is normal. No respiratory distress.     Breath sounds: Normal breath sounds. No wheezing or rales.  Musculoskeletal:     Cervical back: Normal range of motion and neck supple.     Right lower leg: Edema (trace swelling of foot) present.     Left lower leg: Edema (trace swelling of foot) present.  Lymphadenopathy:     Cervical: No cervical adenopathy.  Skin:    General: Skin is warm and dry.     Findings: No bruising or erythema.  Neurological:     General: No focal deficit present.     Mental Status: She is alert and oriented to person, place, and time.  Psychiatric:        Mood and Affect: Mood normal.        Behavior: Behavior normal.        Thought Content: Thought content normal.          Assessment & Plan:   Edema of both  feet- new.  Pt reports sxs started ~1 month ago which would correspond to when the weather got considerably warmer.  She eats bacon and other processed meats almost daily.  Encouraged her to limit her sodium intake- reviewed foods high in sodium w/ her.  Recommended increased water intake.  Elevation of legs when sitting.  Will start Lasix prn.  Pt expressed understanding and is in agreement w/ plan.

## 2021-05-11 ENCOUNTER — Telehealth: Payer: Self-pay | Admitting: Family Medicine

## 2021-05-11 NOTE — Telephone Encounter (Signed)
Called pt back. Advised AL,NP last sent rx Nurtec to [facility] 03/07/21 #8, 11 refills. Refills should be on file. She will follow back up with the pharmacy, nothing further needed.

## 2021-05-11 NOTE — Telephone Encounter (Signed)
Pt requesting refill for Rimegepant Sulfate (NURTEC) 75 MG TBDP. Pharmacy [place] [phone].

## 2021-06-08 DIAGNOSIS — M25562 Pain in left knee: Secondary | ICD-10-CM | POA: Diagnosis not present

## 2021-06-09 DIAGNOSIS — M1712 Unilateral primary osteoarthritis, left knee: Secondary | ICD-10-CM | POA: Diagnosis not present

## 2021-06-15 ENCOUNTER — Ambulatory Visit: Payer: Medicare Other | Admitting: Family Medicine

## 2021-07-06 ENCOUNTER — Telehealth: Payer: Self-pay | Admitting: Family Medicine

## 2021-07-06 NOTE — Telephone Encounter (Signed)
Pt is asking if it is ok to take both propranolol ER (INDERAL LA) 60 MG 24 hr capsule & Rimegepant Sulfate (NURTEC) 75 MG TBDP.  If not which should she stop.

## 2021-07-07 MED ORDER — NURTEC 75 MG PO TBDP: 75.0000 mg | ORAL_TABLET | ORAL | 0 refills | Status: DC

## 2021-07-07 NOTE — Telephone Encounter (Signed)
Called the patient to clarify how long she would be in the donut hole advised since the nurtec is helping Amy would rather keep her on the effective medication if possible and she can take as a preventative 1 tab every other day. Patient states it always depends on how much she has left to spend on whether it would be cheaper at a later time and they are on limited income and prior to going into the donut hole 8 tab was costing her 37$ a mth vs the 95 it cost this past time. Informed her we can offer some samples while we are figuring this out to see if there is a way to get her coverage. Pt will plan to come in on Tuesday and pick those up.

## 2021-07-07 NOTE — Addendum Note (Signed)
Addended by: Darleen Crocker on: 07/07/2021 11:49 AM   Modules accepted: Orders

## 2021-07-07 NOTE — Telephone Encounter (Signed)
Called the pt back. Advised that in the way she had the medications ordered the propranolol was being used as a preventative and the nurtec for acute intervention. Pt states that she stopped the propranolol because she had gained 20 lbs since starting the medication. She felt that she was worse while on the medication and felt better after stopping. She read where nurtec can be used as preventative and she did take it every other day for just a bit and noted improvement by doing that however at this time even though the medication is covered her insurance is in the "donut hole" and the medication at 8 tablets was costing 95$.  There is no copay card to help lower the cost.  She is needing a whole new plan all together preventative and abortive. Advised I will reach out to Amy to get her recommendations.

## 2021-07-10 ENCOUNTER — Telehealth: Payer: Self-pay

## 2021-07-10 NOTE — Telephone Encounter (Signed)
Spoke with patient and she stated that Medicare sent a nurse out to her home to do this, and that she didn't think she needed to do it again. Would like someone to please let her know for sure if she has to or not.

## 2021-07-12 ENCOUNTER — Telehealth: Payer: Self-pay | Admitting: Family Medicine

## 2021-07-12 NOTE — Telephone Encounter (Signed)
Pt called stating her migraines are worsening wanting to know would it be okay if she could be seen sooner. Pt requesting a call back.

## 2021-07-12 NOTE — Telephone Encounter (Signed)
I am helping schedule AWV's over the weekend and when I spoke with Ann Conley she told me that Medicare sent out a nurse to her home to do this kind of visit. I was told to put a note in for the PCP to know about the conversation.

## 2021-07-12 NOTE — Telephone Encounter (Signed)
Called the patient back to confirm she plans to pick up the medication (Nurtec) today. She does plan on coming today or tomorrow to pick it up but states that she had been taking the nurtec every other day while this is going on and that it didn't seem to be as effective in keeping the migraines at [place]. Advised that we will call her if an opening comes available to see if she can be worked in sooner. Informed her that for now keep with current treatment plan. Pt verbalized understanding.

## 2021-07-14 ENCOUNTER — Encounter: Payer: Self-pay | Admitting: Family Medicine

## 2021-07-14 ENCOUNTER — Ambulatory Visit: Payer: Medicare Other | Admitting: Family Medicine

## 2021-07-14 ENCOUNTER — Ambulatory Visit: Payer: Medicare HMO | Admitting: Family Medicine

## 2021-07-14 VITALS — BP 158/86 | HR 88 | Ht 60.0 in | Wt 220.0 lb

## 2021-07-14 DIAGNOSIS — G43109 Migraine with aura, not intractable, without status migrainosus: Secondary | ICD-10-CM

## 2021-07-14 NOTE — Progress Notes (Signed)
Chief Complaint  Patient presents with   Follow-up    Rm 7, alone. Here for migraine management. Past couple weeks, migraine have been more constant. Pt had a migraine w aura, for 3 days in a row, aura will go away after a while.     HISTORY OF PRESENT ILLNESS:  07/14/21 ALL:  Ann Conley returns for follow up for worsening migraines. She was using Nurtec with some success but has not been as effective, recently. She has started taking Nurtec every other day for prevention and felt it was working fairly well until she had a migraine with aura on 8/31. She reports migraine would "come and go" for about 3 days. She did not increase Nurtec but continued every other day dosing. She also stopped propranolol and valsartan. She is uncertain of when she topped these but thinks it was about 3-4 weeks ago. She was seen by PCP 6/23 and valsartan was listed as an active medication. She reports that leg swelling and muscle aches have been better since stopping these medications. She has been watching her diet and walking more and has lost 16 pounds.   03/07/2021 ALL: She returns for follow up for complicated migraines. MRI/MRA unremarkable. Nurtec has been helpful for abortive therapy. BP has been much better with Dr Birdie Riddle. [name] states it has been elevated some at home. She has had 2-3 headaches over the past month but reports they were not bad and aborted with Nurtec. She has not picked up prescription from pharmacy.   She does report having a fall last week. She was walking through her doorway and her foot got caught on the entryway. She hit her head up on the wall in front of her. No other injuries. She reports that her forehead has been a little sore but she is doing well, otherwise. No changes in vision. No confusion. No worsening headaches.    01/27/2021 ALL:  She returns today with concerns of left arm numbness and left visual disturbance that started 1 day ago. While playing cards, last night. She  felt that her left arm was going to sleep. Numbness seemed to wax and wane and would radiate to the left jaw. She describes a kaleidoscope disturbance of the left eye last night around the same time. Visual disturbance lasted about 10 minutes. Numbness of arm is still present, today. It does not seem to make a difference of what position arm is in. Numbness seems to wax and wane. She is constantly sensitive to light. She wears sun glasses all the time, even at home. She denies head pain. She has taken Excedrin that has not helped.  She denies chest pain, trouble breathing, palpitations, sweating, abdominal or back pain, fever, chills, body aches.   She has been working closely with PCP for uncontrolled HTN. Started on valsartan last week. She continues propranolol LA '60mg'$  daily (for migraines) and amlodipine '10mg'$  daily. She is unsure if it has made much difference. BP this morning was 200/86. Her head was not hurting when she took her blood pressure. Usual home readings have been around 160/80's.  Triptans have been avoided d/t history of suspected TIA in 2019 with sudden onset of left arm weakness and facial numbness. Symptoms resolved in about an hour. Imaging unremarkable. She also has history of complicated migraine and migraine with aura. She is usually able to abort headache with Excedrin or ibuprofen. She is concerned that arm numbness has lingered for this long as it usually resolves within 12  hours.    12/16/2020 ALL:  She returns for follow up for complicated migraines. At last visit, we encouraged her to increase propranolol. She reports taking '20mg'$  BID. She does feel that migraines are not as painful. She continues to have a visual aura with some sort of headache 1-3 times a month. PCP gave her '800mg'$  ibuprofen that seems to help. She reports taking 1-2 doses of ibuprofen.   She continues close follow up with PCP for blood pressure management on amlodipine '10mg'$  daily. She does check on occasion  at home but does not remember what readings are. She reports that readings are usually "out of the ballpark". When I ask if today's reading is high for her she reports that it has been this high at home.    09/07/2020 ALL:  Ann Conley is a 87 y.o. female here today for follow up for complicated migraines. She has history of partial vision loss with left arm numbness associated with headache. She was last seen 07/14/2020 by Dr Leta Baptist and advised to increase propranolol to '20mg'$  BID. She has not increased dose. She has had two migraines since being seen. She was evaluated by the ER on 10/6. CT brain normal. She reports symptoms resolved spontaneously without abortive therapy. She usually takes Excedrin but states that she rarely needs to take it. She reports BP is "always high". Reports it was 144/63 at home yesterday.   HISTORY (copied from Dr Gladstone Lighter note on 07/14/2020)  UPDATE (07/14/20, VRP): Since last visit, doing well except for some more migraine, numbness and vision issues. No alleviating or aggravating factors. Tolerating meds. Has only been taking propranolol '20mg'$  daily.      UPDATE (01/07/19, VRP): Since last visit, doing well. Symptoms are resolved. Severity is mild. No alleviating or aggravating factors. Tolerating meds. Having 1-2 HA per month, and controlled with Excedrin migraine.   PRIOR HPI (09/14/18): 87 year old female here for evaluation:   STABBING HEADACHES Patient has had long history since childhood of intermittent sharp stabbing headaches lasting for a few seconds, on the right side of her head.  These can occur several times per day and several times per month.  Patient looked up online and diagnosed herself with ice pick headaches.  She does not take any medications for these symptoms because they are so short lasting.  They have slightly worsened in the last few months.  Patient has been under more stress with her husband's illness and other family issues.   MIGRAINE  TYPE HA Since 2016 patient has had a different type of headache which consists of partial vision loss in the left eye, followed by severe global pounding throbbing headaches associated with photophobia.  No nausea or vomiting.  Headaches can last 2 hours or longer at a time.  She diagnosed herself with "ocular migraines".  She is taken over-the-counter medications with mild relief.  These headaches have been worse than last 6 to 12 months, but have spontaneously resolved since August 2019.   LEFT ARM NUMBNESS 08/10/2018 patient noticed sudden onset of left arm numbness and weakness.  She was having trouble controlling her left arm.  She also noticed some numbness in her left face.  Symptoms lasted for 1 hour.  Patient went to the emergency room for evaluation.  CT of the head and lab testing were unremarkable.  Blood pressure was 175/85.  Apparently "low concern for TIA" was determined and patient was encouraged to follow-up with PCP for evaluation. On 08/23/2018 patient had recurrence of  symptoms, with left arm and left face numbness and tingling.  Patient called PCP who recommended ER evaluation but patient declined to go.   HYPERTENSION Patient ran out of her blood pressure medications 2 days ago.  She says that her blood pressure is "always high".  She is on amlodipine 10 mg daily.  Today blood pressure is 170/89.    REVIEW OF SYSTEMS: Out of a complete 14 system review of symptoms, the patient complains only of the following symptoms, headaches, left arm and facial numbness and all other reviewed systems are negative.    ALLERGIES: Allergies  Allergen Reactions   Codeine Nausea And Vomiting     HOME MEDICATIONS: Outpatient Medications Prior to Visit  Medication Sig Dispense Refill   amLODipine (NORVASC) 10 MG tablet TAKE ONE TABLET BY MOUTH DAILY 90 tablet 1   Ascorbic Acid (VITAMIN C PO) Take by mouth.     aspirin 81 MG tablet Take 81 mg by mouth every evening.       aspirin-acetaminophen-caffeine (EXCEDRIN MIGRAINE) 250-250-65 MG tablet Take by mouth every 6 (six) hours as needed for headache.     furosemide (LASIX) 20 MG tablet Take 1 tablet (20 mg total) by mouth daily. 30 tablet 3   ibuprofen (ADVIL) 800 MG tablet TAKE ONE TABLET BY MOUTH EVERY 8 HOURS AS NEEDED FOR HEADACHE 30 tablet 0   Magnesium 250 MG TABS Take 250 mg by mouth daily.     multivitamin-lutein (OCUVITE-LUTEIN) CAPS capsule Take 1 capsule by mouth daily.     omeprazole (PRILOSEC) 20 MG capsule Take 1 capsule (20 mg total) by mouth daily. (Patient taking differently: Take 20 mg by mouth as needed.) 90 capsule 1   Rimegepant Sulfate (NURTEC) 75 MG TBDP Take 75 mg by mouth daily as needed (take for abortive therapy of migraine, no more than 1 tablet in 24 hours or 10 per month). 8 tablet 11   Rimegepant Sulfate (NURTEC) 75 MG TBDP Take 75 mg by mouth every other day. 16 tablet 0   vitamin B-12 (CYANOCOBALAMIN) 100 MCG tablet Take 100 mcg by mouth daily.     propranolol ER (INDERAL LA) 60 MG 24 hr capsule Take 1 capsule (60 mg total) by mouth daily. 90 capsule 1   valsartan (DIOVAN) 40 MG tablet Take 1 tablet (40 mg total) by mouth daily. 30 tablet 3   No facility-administered medications prior to visit.     PAST MEDICAL HISTORY: Past Medical History:  Diagnosis Date   Anemia    Blood transfusion complicating pregnancy [id number]   GERD (gastroesophageal reflux disease)    History of chicken pox    Hypertension    Migraine    occular   TIA (transient ischemic attack)      PAST SURGICAL HISTORY: Past Surgical History:  Procedure Laterality Date   BREAST BIOPSY     CHOLECYSTECTOMY     PARTIAL HYSTERECTOMY       FAMILY HISTORY: Family History  Problem Relation Age of Onset   Breast cancer Mother    Heart disease Father    Heart disease Sister        pacemaker   Diabetes Sister      SOCIAL HISTORY: Social History   Socioeconomic History   Marital status: Married     Spouse name: Not on file   Number of children: Not on file   Years of education: Not on file   Highest education level: Not on file  Occupational History   Not  on file  Tobacco Use   Smoking status: Never   Smokeless tobacco: Never  Vaping Use   Vaping Use: Never used  Substance and Sexual Activity   Alcohol use: Yes    Alcohol/week: 0.0 standard drinks    Comment: wine occ.   Drug use: No   Sexual activity: Not on file  Other Topics Concern   Not on file  Social History Narrative   Lives with husband.  5 children.  Education BA.   Social Determinants of Health   Financial Resource Strain: Not on file  Food Insecurity: Not on file  Transportation Needs: Not on file  Physical Activity: Not on file  Stress: Not on file  Social Connections: Not on file  Intimate Partner Violence: Not on file      PHYSICAL EXAM  Vitals:   07/14/21 1523  BP: (!) 158/86  Pulse: 88  Weight: 220 lb (99.8 kg)  Height: 5' (1.524 m)    Body mass index is 42.97 kg/m.   Generalized: Well developed, in no acute distress   Cardiology: normal rate and rhythm Respiratory: clear to auscultation bilaterally   Neurological examination  Mentation: Alert oriented to time, place, history taking. Follows all commands speech and language fluent, no slurred speech or confusion.   Cranial nerve II-XII: Pupils were equal round reactive to light. Right iris with darker coloring at 6 o'clock (baseline) Extraocular movements were full, visual field were full on confrontational test. Facial sensation and strength were normal.. Head turning and shoulder shrug  were normal and symmetric. Motor: The motor testing reveals 5 over 5 strength of all 4 extremities. Good symmetric motor tone is noted throughout.  Sensory: Sensory testing is intact to soft touch on all 4 extremities. Pinprick and temperature sensation intact. No evidence of extinction is noted.  Coordination: Cerebellar testing reveals good  finger-nose-finger and heel-to-shin bilaterally.  Gait and station: Gait is normal     DIAGNOSTIC DATA (LABS, IMAGING, TESTING) - I reviewed patient records, labs, notes, testing and imaging myself where available.  Lab Results  Component Value Date   WBC 6.6 03/17/2021   HGB 12.5 03/17/2021   HCT 37.2 03/17/2021   MCV 87.2 03/17/2021   PLT 247.0 03/17/2021      Component Value Date/Time   NA 137 03/17/2021 1407   K 4.5 03/17/2021 1407   CL 100 03/17/2021 1407   CO2 31 03/17/2021 1407   GLUCOSE 92 03/17/2021 1407   BUN 20 03/17/2021 1407   CREATININE 0.56 03/17/2021 1407   CREATININE 0.56 (L) 12/10/2015 1537   CALCIUM 9.5 03/17/2021 1407   PROT 7.1 03/17/2021 1407   ALBUMIN 4.2 03/17/2021 1407   AST 15 03/17/2021 1407   ALT 12 03/17/2021 1407   ALKPHOS 85 03/17/2021 1407   BILITOT 0.4 03/17/2021 1407   GFRNONAA >60 01/27/2021 1757   GFRAA >60 08/10/2018 1330   Lab Results  Component Value Date   CHOL 181 03/17/2021   HDL 62.90 03/17/2021   LDLCALC 82 03/17/2021   LDLDIRECT 125.0 09/09/2012   TRIG 178.0 (H) 03/17/2021   CHOLHDL 3 03/17/2021   Lab Results  Component Value Date   HGBA1C 5.7 09/12/2019   No results found for: VITAMINB12 Lab Results  Component Value Date   TSH 2.16 03/17/2021      ASSESSMENT AND PLAN  87 y.o. year old female  has a past medical history of Anemia, Blood transfusion complicating pregnancy (A999333), GERD (gastroesophageal reflux disease), History of chicken pox, Hypertension,  Migraine, and TIA (transient ischemic attack). here with   Migraine with aura and without status migrainosus, not intractable  Raeleen reports that headaches have been less frequent and less severe over the past three months. Nurtec has worked well. She did have one breakthrough headache last week that waxed and waned for about 3 days. I have recommended CGRP injection monthly for prevention but she wishes to hold off for now. She will continue Nurtec every  other day. She can take Tylenol and ibuprofen sparingly for breakthrough migraine. BP is a little elevated today but is much better than previous readings. I am concerned it may continue to increase as she has stopped propranolol and valsartan. She will continue close follow up with Dr Birdie Riddle. She will continue healthy lifestyle habits. She will follow up with me in 6 months.    Debbora Presto, MSN, FNP-C 07/14/2021, 3:41 PM  [facility] [address], [place], [place] [address] [phone]

## 2021-07-14 NOTE — Patient Instructions (Signed)
Below is our plan:  We will continue Nurtec every other day for migraine prevention. You can take Tylenol '1000mg'$  and ibuprofen up to '800mg'$  with Nurtec as needed but please do not use this regularly. We will consider adding injections every 30 days if needed. I have included information for review on Ajovy which is one of the injections we will consider.   Please make sure you are staying well hydrated. I recommend 50-60 ounces daily. Well balanced diet and regular exercise encouraged. Consistent sleep schedule with 6-8 hours recommended.   Please continue follow up with care team as directed.   Follow up with me in 6 months   You may receive a survey regarding today's visit. I encourage you to leave honest feed back as I do use this information to improve patient care. Thank you for seeing me today!

## 2021-07-19 ENCOUNTER — Telehealth: Payer: Self-pay | Admitting: *Deleted

## 2021-07-19 NOTE — Chronic Care Management (AMB) (Signed)
  Chronic Care Management   Note  07/19/2021 Name: CHESSA BARRASSO MRN: [id number] DOB: Dec 23, 1933  RASCHELLE WISENBAKER is a 87 y.o. year old female who is a primary care patient of Birdie Riddle, Aundra Millet, MD. I reached out to [name] by phone today in response to a referral sent by Ms. Midge Minium Palomino's PCP, Midge Minium, MD.      Ms. Weems was given information about Chronic Care Management services today including:  CCM service includes personalized support from designated clinical staff supervised by her physician, including individualized plan of care and coordination with other care providers 24/7 contact phone numbers for assistance for urgent and routine care needs. Service will only be billed when office clinical staff spend 20 minutes or more in a month to coordinate care. Only one practitioner may furnish and bill the service in a calendar month. The patient may stop CCM services at any time (effective at the end of the month) by phone call to the office staff. The patient will be responsible for cost sharing (co-pay) of up to 20% of the service fee (after annual deductible is met).  Patient agreed to services and verbal consent obtained.   Follow up plan: Telephone appointment with care management team member scheduled for: 07/20/21  [place] [facility]  Direct Dial: [phone]

## 2021-07-20 ENCOUNTER — Ambulatory Visit (INDEPENDENT_AMBULATORY_CARE_PROVIDER_SITE_OTHER): Payer: Medicare Other

## 2021-07-20 ENCOUNTER — Telehealth: Payer: Self-pay | Admitting: Family Medicine

## 2021-07-20 DIAGNOSIS — G43109 Migraine with aura, not intractable, without status migrainosus: Secondary | ICD-10-CM

## 2021-07-20 DIAGNOSIS — I10 Essential (primary) hypertension: Secondary | ICD-10-CM

## 2021-07-20 DIAGNOSIS — M170 Bilateral primary osteoarthritis of knee: Secondary | ICD-10-CM

## 2021-07-20 NOTE — Chronic Care Management (AMB) (Signed)
Chronic Care Management   CCM RN Visit Note  07/20/2021 Name: Ann Conley MRN: [id number] DOB: 08/20/1934  Subjective: Ann Conley is a 87 y.o. year old female who is a primary care patient of Birdie Riddle, Aundra Millet, MD. The care management team was consulted for assistance with disease management and care coordination needs.    Engaged with patient by telephone for initial visit in response to provider referral for case management and/or care coordination services.   Consent to Services:  The patient was given the following information about Chronic Care Management services today, agreed to services, and gave verbal consent: 1. CCM service includes personalized support from designated clinical staff supervised by the primary care provider, including individualized plan of care and coordination with other care providers 2. 24/7 contact phone numbers for assistance for urgent and routine care needs. 3. Service will only be billed when office clinical staff spend 20 minutes or more in a month to coordinate care. 4. Only one practitioner may furnish and bill the service in a calendar month. 5.The patient may stop CCM services at any time (effective at the end of the month) by phone call to the office staff. 6. The patient will be responsible for cost sharing (co-pay) of up to 20% of the service fee (after annual deductible is met). Patient agreed to services and consent obtained.  Patient agreed to services and verbal consent obtained.   Assessment: Review of patient past medical history, allergies, medications, health status, including review of consultants reports, laboratory and other test data, was performed as part of comprehensive evaluation and provision of chronic care management services.   SDOH (Social Determinants of Health) assessments and interventions performed:  SDOH Interventions    Flowsheet Row Most Recent Value  SDOH Interventions   Food Insecurity Interventions  Intervention Not Indicated  Financial Strain Interventions Intervention Not Indicated  Housing Interventions Intervention Not Indicated  Stress Interventions Intervention Not Indicated  Transportation Interventions Intervention Not Indicated        CCM Care Plan  Allergies  Allergen Reactions   Codeine Nausea And Vomiting    Outpatient Encounter Medications as of 07/20/2021  Medication Sig   amLODipine (NORVASC) 10 MG tablet TAKE ONE TABLET BY MOUTH DAILY   Ascorbic Acid (VITAMIN C PO) Take by mouth.   aspirin 81 MG tablet Take 81 mg by mouth every evening.    aspirin-acetaminophen-caffeine (EXCEDRIN MIGRAINE) 250-250-65 MG tablet Take by mouth every 6 (six) hours as needed for headache.   furosemide (LASIX) 20 MG tablet Take 1 tablet (20 mg total) by mouth daily.   ibuprofen (ADVIL) 800 MG tablet TAKE ONE TABLET BY MOUTH EVERY 8 HOURS AS NEEDED FOR HEADACHE   Magnesium 250 MG TABS Take 250 mg by mouth daily.   multivitamin-lutein (OCUVITE-LUTEIN) CAPS capsule Take 1 capsule by mouth daily.   omeprazole (PRILOSEC) 20 MG capsule Take 1 capsule (20 mg total) by mouth daily. (Patient taking differently: Take 20 mg by mouth as needed.)   Rimegepant Sulfate (NURTEC) 75 MG TBDP Take 75 mg by mouth daily as needed (take for abortive therapy of migraine, no more than 1 tablet in 24 hours or 10 per month).   Rimegepant Sulfate (NURTEC) 75 MG TBDP Take 75 mg by mouth every other day.   vitamin B-12 (CYANOCOBALAMIN) 100 MCG tablet Take 100 mcg by mouth daily.   No facility-administered encounter medications on file as of 07/20/2021.    Patient Active Problem List   Diagnosis Date Noted  Primary stabbing headache 09/11/2018   Primary osteoarthritis of both knees 06/01/2017   Burning mouth syndrome 04/13/2016   Bleeding hemorrhoid 05/05/1600   Complicated migraine 09/32/3557   Keratoma 10/19/2014   Pain in lower limb 10/19/2014   Hammer toe of left foot 10/19/2014   Morbid obesity ([place])  09/17/2014   Snoring 03/13/2013   Left anterior knee pain 03/13/2013   Mole (skin) 09/18/2011   Migraine with aura 05/07/2011   Essential hypertension 04/25/2011   GERD (gastroesophageal reflux disease) 04/25/2011   Annual physical exam 04/25/2011   BREAST CANCER, HX OF 06/21/2007    Conditions to be addressed/monitored:HTN and osteoarthritis, migraine   Care Plan : Chronic pain from migraine and osteoarthritis  Updates made by Dimitri Ped, RN since 07/20/2021 12:00 AM     Problem: Need for long term self management of chronic pain from migraine and osteoarthritis   Priority: High     Long-Range Goal: Manage Pain   Start Date: 07/20/2021  Expected End Date: 01/04/2022  This Visit's Progress: On track  Priority: High  Note:   Current Barriers:  Knowledge Deficits related to self-health management of acute or chronic pain from migraines and osteoarthritis Chronic Disease Management support and education needs related to chronic pain from migraine and osteoarthritis Knowledge Deficits related to Chronic pain from migraine and osteoarthritis Chronic Disease Management support and education needs related to Chronic pain from migraine and osteoarthritis Financial Constraints. Cost of Nurtec Unable to independently Chronic pain from migraine and osteoarthritis States that her migraine's are helped when she takes the Nurtec but she is in the doughnut hole and can not afford the medication now.  States she does take Excedrin and Motrin as neurology told her to take as needed.  States her lt knee may need to be replaced and she is to see orthopedics tomorrow.  States she has been working on weight loss by eating less and walking for exercise so that she can have surgery if needed Clinical Goal(s):  patient will verbalize understanding of plan for pain management. , patient will attend all scheduled medical appointments: CCM PharmD 09/28/21, neurology 01/12/21, orthopedics 07/21/21, patient  will demonstrate use of different relaxation  skills and/or diversional activities to assist with pain reduction (distraction, imagery, relaxation, massage, acupressure, TENS, heat, and cold application., patient will report pain at a level less than 3 to 4 on a 10-10 rating scale., patient will use pharmacological and nonpharmacological pain relief strategies as prescribed. , and patient will verbalize acceptable level of pain relief and ability to engage in desired activities Interventions:  Collaboration with Midge Minium, MD regarding development and update of comprehensive plan of care as evidenced by provider attestation and co-signature Pain assessment performed Medications reviewed Discussed plans with patient for ongoing care management follow up and provided patient with direct contact information for care management team Evaluation of current treatment plan related to Chronic pain from migraine and osteoarthritis and patient's adherence to plan as established by provider. Provided education to patient re: Chronic pain from migraine and osteoarthritis Reviewed medications with patient and discussed adherence to medications  Reviewed scheduled/upcoming provider appointments including:  Pharmacy referral for cost of Nurtec and scheduled for 09/28/21 Discussed plans with patient for ongoing care management follow up and provided patient with direct contact information for care management team Reviewed to cal RNCM with their household income to give to [facility] PharmD to see they they will qualify for pharmacy assistance Patient Goals/Self Care Activities:  Will self-administer  medications as prescribed Will attend all scheduled provider appointments Will call pharmacy for medication refills 7 days prior to needed refill date Patient will calls provider office for new concerns or questions Follow Up Plan: Telephone follow up appointment with care management team member scheduled for:  08/31/21 at 9:45 AM The patient has been provided with contact information for the care management team and has been advised to call with any health related questions or concerns.       Care Plan : Lack of long term self managment of Hypertension  Updates made by Dimitri Ped, RN since 07/20/2021 12:00 AM     Problem: Hypertension (Hypertension)      Long-Range Goal: Effective  long term self managment of Hypertension   Start Date: 07/20/2021  Expected End Date: 01/04/2022  This Visit's Progress: On track  Priority: Medium  Note:   Current Barriers:  Knowledge Deficits related to basic understanding of hypertension pathophysiology and self care management Unable to independently self manage Hypertension Does not adhere to prescribed medication regimen States she checks her B/P about 3 times a week and it was 144/75 today.  States she stopped taking the Valsartan because it made her legs hurt.  States she try to eat health but she is on a Keto diet with more meats.  States she is walking for exercise and would like to lose 10 more lbs Nurse Case Manager Clinical Goal(s):  patient will verbalize understanding of plan for hypertension management patient will not experience hospital admission. Hospital Admissions in last 6 months = 0 patient will attend all scheduled medical appointments: CCM PharmD 09/28/21 patient will demonstrate improved adherence to prescribed treatment plan for hypertension as evidenced by taking all medications as prescribed, monitoring and recording blood pressure as directed, adhering to low sodium/DASH diet patient will demonstrate improved health management independence as evidenced by checking blood pressure as directed and notifying PCP if SBP>160 or DBP > 90, taking all medications as prescribe, and adhering to a low sodium diet as discussed. patient will verbalize basic understanding of hypertension disease process and self health management plan as  evidenced by readings within limits, adherence to diet and medications Interventions:  Collaboration with Midge Minium, MD regarding development and update of comprehensive plan of care as evidenced by provider attestation and co-signature Inter-disciplinary care team collaboration (see longitudinal plan of care) Evaluation of current treatment plan related to hypertension self management and patient's adherence to plan as established by provider. Provided education to patient re: stroke prevention, s/s of heart attack and stroke, DASH diet, complications of uncontrolled blood pressure Reviewed medications with patient and discussed importance of compliance Discussed plans with patient for ongoing care management follow up and provided patient with direct contact information for care management team Advised patient, providing education and rationale, to monitor blood pressure daily and record, calling PCP for findings outside established parameters.  Reviewed scheduled/upcoming provider appointments including: CCM PharmD 09/28/21 Self-Care Activities:  Self administers medications as prescribed Attends all scheduled provider appointments Calls pharmacy for medication refills Calls provider office for new concerns or questions Patient Goals  - Self administer medications as prescribed  - Attend all scheduled provider appointments  - Call provider office for new concerns, questions, or BP outside discussed parameters  - Check BP and record as discussed  - Follows a low sodium diet/DASH diet - check blood pressure 3 times per week - choose a place to take my blood pressure (home, clinic or office, retail store) -  write blood pressure results in a log or diary - ask questions to understand - change to whole grain breads, cereal, pasta - drink 6 to 8 glasses of water each day - eat fish at least once per week - fill half of plate with vegetables - limit fast food meals to no more than  1 per week - prepare main meal at home 3 to 5 days each week - read food labels for fat, fiber, carbohydrates and portion size - reduce red meat to 2 to 3 times a week - switch to low-fat or skim milk - switch to sugar-free drinks Follow Up Plan: Telephone follow up appointment with care management team member scheduled for: 08/31/21 at 9:45 AM The patient has been provided with contact information for the care management team and has been advised to call with any health related questions or concerns.       Plan:Telephone follow up appointment with care management team member scheduled for:  08/31/21 and The patient has been provided with contact information for the care management team and has been advised to call with any health related questions or concerns.  Peter Garter RN, BSN,CCM, CDE Care Management Coordinator [place] [phone], Mobile [phone]

## 2021-07-20 NOTE — Patient Instructions (Signed)
Visit Information   PATIENT GOALS:   Goals Addressed             This Visit's Progress    RNCM:Cope with Pain-Osteoarthritis and migraine       Timeframe:  Long-Range Goal Priority:  High Start Date:     07/20/21                        Expected End Date:  01/04/22                      Follow Up Date 08/31/21    - practice acceptance of chronic pain - practice relaxation or meditation daily - tell myself I can (not I can't) - think of new ways to do favorite things - use distraction techniques - use relaxation during pain    Why is this important?   Living with joint pain and enjoying your life may be hard.  Feelings like depression or anger can make your pain worse.  Learning ways to cope may help you find some relief from the pain.    Notes:      RNCM:Eat Healthy       Timeframe:  Long-Range Goal Priority:  Medium Start Date:    07/20/21                         Expected End Date:  01/04/22                    Follow Up Date 08/31/21    - change to whole grain breads, cereal, pasta - drink 6 to 8 glasses of water each day - eat fish at least once per week - fill half of plate with vegetables - limit fast food meals to no more than 1 per week - prepare main meal at home 3 to 5 days each week - read food labels for fat, fiber, carbohydrates and portion size - reduce red meat to 2 to 3 times a week - switch to low-fat or skim milk - switch to sugar-free drinks    Why is this important?   When you are ready to manage your nutrition or weight, having a plan and setting goals will help.  Taking small steps to change how you eat and exercise is a good place to start.    Notes:      RNCM:Track and Manage My Blood Pressure-Hypertension       Timeframe:  Long-Range Goal Priority:  Medium Start Date:   07/20/21                          Expected End Date:   01/04/22                    Follow Up Date 08/31/21    - check blood pressure 3 times per week - choose a place to  take my blood pressure (home, clinic or office, retail store) - write blood pressure results in a log or diary    Why is this important?   You won't feel high blood pressure, but it can still hurt your blood vessels.  High blood pressure can cause heart or kidney problems. It can also cause a stroke.  Making lifestyle changes like losing a little weight or eating less salt will help.  Checking your blood pressure at home and at different  times of the day can help to control blood pressure.  If the doctor prescribes medicine remember to take it the way the doctor ordered.  Call the office if you cannot afford the medicine or if there are questions about it.     Notes:       Cooking With Less Salt Cooking with less salt is one way to reduce the amount of sodium you get from food. Sodium is one of the elements that make up salt. It is found naturally in foods and is also added to certain foods. Depending on your condition and overall health, your health care provider or dietitian may recommend that you reduce your sodium intake. Most people should have less than 2,300 milligrams (mg) of sodium each day. If you have high blood pressure (hypertension), you may need to limit your sodium to 1,500 mg each day. Follow the tips below to help reduce your sodium intake. What are tips for eating less sodium? Reading food labels  Check the food label before buying or using packaged ingredients. Always check the label for the serving size and sodium content. Look for products with no more than 140 mg of sodium in one serving. Check the % Daily Value column to see what percent of the daily recommended amount of sodium is provided in one serving of the product. Foods with 5% or less in this column are considered low in sodium. Foods with 20% or higher are considered high in sodium. Do not choose foods with salt as one of the first three ingredients on the ingredients list. If salt is one of the first three  ingredients, it usually means the item is high in sodium. Shopping Buy sodium-free or low-sodium products. Look for the following words on food labels: Low-sodium. Sodium-free. Reduced-sodium. No salt added. Unsalted. Always check the sodium content even if foods are labeled as low-sodium or no salt added. Buy fresh foods. Cooking Use herbs, seasonings without salt, and spices as substitutes for salt. Use sodium-free baking soda when baking. Grill, braise, or roast foods to add flavor with less salt. Avoid adding salt to pasta, rice, or hot cereals. Drain and rinse canned vegetables, beans, and meat before use. Avoid adding salt when cooking sweets and desserts. Cook with low-sodium ingredients. What foods are high in sodium? Vegetables Regular canned vegetables (not low-sodium or reduced-sodium). Sauerkraut, pickled vegetables, and relishes. Olives. [place] fries. Onion rings. Regular canned tomato sauce and paste. Regular tomato and vegetable juice. Frozen vegetables in sauces. Grains Instant hot cereals. Bread stuffing, pancake, and biscuit mixes. Croutons. Seasoned rice or pasta mixes. Noodle soup cups. Boxed or frozen macaroni and cheese. Regular salted crackers. Self-rising flour. Rolls. Bagels. Flour tortillas and wraps. Meats and other proteins Meat or fish that is salted, canned, smoked, cured, spiced, or pickled. This includes bacon, ham, sausages, hot dogs, corned beef, chipped beef, meat loaves, salt pork, jerky, pickled herring, anchovies, regular canned tuna, and sardines. Salted nuts. Dairy Processed cheese and cheese spreads. Cheese curds. Blue cheese. Feta cheese. String cheese. Regular cottage cheese. Buttermilk. Canned milk. The items listed above may not be a complete list of foods high in sodium. Actual amounts of sodium may be different depending on processing. Contact a dietitian for more information. What foods are low in sodium? Fruits Fresh, frozen, or canned  fruit with no sauce added. Fruit juice. Vegetables Fresh or frozen vegetables with no sauce added. "No salt added" canned vegetables. "No salt added" tomato sauce and paste. Low-sodium or  reduced-sodium tomato and vegetable juice. Grains Noodles, pasta, quinoa, rice. Shredded or puffed wheat or puffed rice. Regular or quick oats (not instant). Low-sodium crackers. Low-sodium bread. Whole-grain bread and whole-grain pasta. Unsalted popcorn. Meats and other proteins Fresh or frozen whole meats, poultry (not injected with sodium), and fish with no sauce added. Unsalted nuts. Dried peas, beans, and lentils without added salt. Unsalted canned beans. Eggs. Unsalted nut butters. Low-sodium canned tuna or chicken. Dairy Milk. Soy milk. Yogurt. Low-sodium cheeses, such as Swiss, Monterey Jack, [place], and [employer]. Sherbet or ice cream (keep to  cup per serving). Cream cheese. Fats and oils Unsalted butter or margarine. Other foods Homemade pudding. Sodium-free baking soda and baking powder. Herbs and spices. Low-sodium seasoning mixes. Beverages Coffee and tea. Carbonated beverages. The items listed above may not be a complete list of foods low in sodium. Actual amounts of sodium may be different depending on processing. Contact a dietitian for more information. What are some salt alternatives when cooking? The following are herbs, seasonings, and spices that can be used instead of salt to flavor your food. Herbs should be fresh or dried. Do not choose packaged mixes. Next to the name of the herb, spice, or seasoning are some examples of foods you can pair it with. Herbs [place] leaves - Soups, meat and vegetable dishes, and spaghetti sauce. Basil - [employer], soups, pasta, and fish dishes. Cilantro - Meat, poultry, and vegetable dishes. Chili powder - Marinades and Mexican dishes. Chives - Salad dressings and potato dishes. Cumin - Mexican dishes, couscous, and meat dishes. Dill - Fish dishes,  sauces, and salads. Fennel - Meat and vegetable dishes, breads, and cookies. Garlic (do not use garlic salt) - [place] dishes, meat dishes, salad dressings, and sauces. Marjoram - Soups, potato dishes, and meat dishes. Oregano - Pizza and spaghetti sauce. Parsley - Salads, soups, pasta, and meat dishes. Rosemary - [place] dishes, salad dressings, soups, and red meats. Saffron - Fish dishes, pasta, and some poultry dishes. Sage - Stuffings and sauces. Tarragon - Fish and [employer]. Thyme - Stuffing, meat, and fish dishes. Seasonings Lemon juice - Fish dishes, poultry dishes, vegetables, and salads. Vinegar - Salad dressings, vegetables, and fish dishes. Spices Cinnamon - Sweet dishes, such as cakes, cookies, and puddings. Cloves - Gingerbread, puddings, and marinades for meats. Curry - Vegetable dishes, fish and poultry dishes, and stir-fry dishes. Ginger - Vegetable dishes, fish dishes, and stir-fry dishes. Nutmeg - Pasta, vegetables, poultry, fish dishes, and custard. Summary Cooking with less salt is one way to reduce the amount of sodium that you get from food. Buy sodium-free or low-sodium products. Check the food label before using or buying packaged ingredients. Use herbs, seasonings without salt, and spices as substitutes for salt in foods. This information is not intended to replace advice given to you by your health care provider. Make sure you discuss any questions you have with your health care provider. Document Revised: 10/15/2019 Document Reviewed: 10/15/2019 Elsevier Patient Education  2022 [place]. Migraine Headache A migraine headache is a very strong throbbing pain on one side or both sides of your head. This type of headache can also cause other symptoms. It can last from 4 hours to 3 days. Talk with your doctor about what things may bring on (trigger) this condition. What are the causes? The exact cause of this condition is not known. This condition may  be triggered or caused by: Drinking alcohol. Smoking. Taking medicines, such as: Medicine used to treat  chest pain (nitroglycerin). Birth control pills. Estrogen. Some blood pressure medicines. Eating or drinking certain products. Doing physical activity. Other things that may trigger a migraine headache include: Having a menstrual period. Pregnancy. Hunger. Stress. Not getting enough sleep or getting too much sleep. Weather changes. Tiredness (fatigue). What increases the risk? Being 56-68 years old. Being female. Having a family history of migraine headaches. Being Caucasian. Having depression or anxiety. Being very overweight. What are the signs or symptoms? A throbbing pain. This pain may: Happen in any area of the head, such as on one side or both sides. Make it hard to do daily activities. Get worse with physical activity. Get worse around bright lights or loud noises. Other symptoms may include: Feeling sick to your stomach (nauseous). Vomiting. Dizziness. Being sensitive to bright lights, loud noises, or smells. Before you get a migraine headache, you may get warning signs (an aura). An aura may include: Seeing flashing lights or having blind spots. Seeing bright spots, halos, or zigzag lines. Having tunnel vision or blurred vision. Having numbness or a tingling feeling. Having trouble talking. Having weak muscles. Some people have symptoms after a migraine headache (postdromal phase), such as: Tiredness. Trouble thinking (concentrating). How is this treated? Taking medicines that: Relieve pain. Relieve the feeling of being sick to your stomach. Prevent migraine headaches. Treatment may also include: Having acupuncture. Avoiding foods that bring on migraine headaches. Learning ways to control your body functions (biofeedback). Therapy to help you know and deal with negative thoughts (cognitive behavioral therapy). Follow these instructions at  home: Medicines Take over-the-counter and prescription medicines only as told by your doctor. Ask your doctor if the medicine prescribed to you: Requires you to avoid driving or using heavy machinery. Can cause trouble pooping (constipation). You may need to take these steps to prevent or treat trouble pooping: Drink enough fluid to keep your pee (urine) pale yellow. Take over-the-counter or prescription medicines. Eat foods that are high in fiber. These include beans, whole grains, and fresh fruits and vegetables. Limit foods that are high in fat and sugar. These include fried or sweet foods. Lifestyle Do not drink alcohol. Do not use any products that contain nicotine or tobacco, such as cigarettes, e-cigarettes, and chewing tobacco. If you need help quitting, ask your doctor. Get at least 8 hours of sleep every night. Limit and deal with stress. General instructions   Keep a journal to find out what may bring on your migraine headaches. For example, write down: What you eat and drink. How much sleep you get. Any change in what you eat or drink. Any change in your medicines. If you have a migraine headache: Avoid things that make your symptoms worse, such as bright lights. It may help to lie down in a dark, quiet room. Do not drive or use heavy machinery. Ask your doctor what activities are safe for you. Keep all follow-up visits as told by your doctor. This is important. Contact a doctor if: You get a migraine headache that is different or worse than others you have had. You have more than 15 headache days in one month. Get help right away if: Your migraine headache gets very bad. Your migraine headache lasts longer than 72 hours. You have a fever. You have a stiff neck. You have trouble seeing. Your muscles feel weak or like you cannot control them. You start to lose your balance a lot. You start to have trouble walking. You pass out (faint). You have a  seizure. Summary A migraine headache is a very strong throbbing pain on one side or both sides of your head. These headaches can also cause other symptoms. This condition may be treated with medicines and changes to your lifestyle. Keep a journal to find out what may bring on your migraine headaches. Contact a doctor if you get a migraine headache that is different or worse than others you have had. Contact your doctor if you have more than 15 headache days in a month. This information is not intended to replace advice given to you by your health care provider. Make sure you discuss any questions you have with your health care provider. Document Revised: 02/14/2019 Document Reviewed: 12/05/2018 Elsevier Patient Education  2022 [employer].   Consent to CCM Services: Ms. Borelli was given information about Chronic Care Management services including:  CCM service includes personalized support from designated clinical staff supervised by her physician, including individualized plan of care and coordination with other care providers 24/7 contact phone numbers for assistance for urgent and routine care needs. Service will only be billed when office clinical staff spend 20 minutes or more in a month to coordinate care. Only one practitioner may furnish and bill the service in a calendar month. The patient may stop CCM services at any time (effective at the end of the month) by phone call to the office staff. The patient will be responsible for cost sharing (co-pay) of up to 20% of the service fee (after annual deductible is met).  Patient agreed to services and verbal consent obtained.   The patient verbalized understanding of instructions, educational materials, and care plan provided today and agreed to receive a mailed copy of patient instructions, educational materials, and care plan.   Telephone follow up appointment with care management team member scheduled for:  Peter Garter RN,  [facility], CDE Care Management Coordinator [facility] [phone], [place] [phone]   CLINICAL CARE PLAN: Patient Care Plan: Chronic pain from migraine and osteoarthritis     Problem Identified: Need for long term self management of chronic pain from migraine and osteoarthritis   Priority: High     Long-Range Goal: Manage Pain   Start Date: 07/20/2021  Expected End Date: 01/04/2022  This Visit's Progress: On track  Priority: High  Note:   Current Barriers:  Knowledge Deficits related to self-health management of acute or chronic pain from migraines and osteoarthritis Chronic Disease Management support and education needs related to chronic pain from migraine and osteoarthritis Knowledge Deficits related to Chronic pain from migraine and osteoarthritis Chronic Disease Management support and education needs related to Chronic pain from migraine and osteoarthritis Financial Constraints. Cost of Nurtec Unable to independently Chronic pain from migraine and osteoarthritis States that her migraine's are helped when she takes the Nurtec but she is in the doughnut hole and can not afford the medication now.  States she does take Excedrin and Motrin as neurology told her to take as needed.  States her lt knee may need to be replaced and she is to see orthopedics tomorrow.  States she has been working on weight loss by eating less and walking for exercise so that she can have surgery if needed Clinical Goal(s):  patient will verbalize understanding of plan for pain management. , patient will attend all scheduled medical appointments: CCM PharmD 09/28/21, neurology 01/12/21, orthopedics 07/21/21, patient will demonstrate use of different relaxation  skills and/or diversional activities to assist with pain reduction (distraction, imagery, relaxation, massage, acupressure, TENS,  heat, and cold application., patient will report pain at a level less than 3 to 4 on a 10-10 rating  scale., patient will use pharmacological and nonpharmacological pain relief strategies as prescribed. , and patient will verbalize acceptable level of pain relief and ability to engage in desired activities Interventions:  Collaboration with Midge Minium, MD regarding development and update of comprehensive plan of care as evidenced by provider attestation and co-signature Pain assessment performed Medications reviewed Discussed plans with patient for ongoing care management follow up and provided patient with direct contact information for care management team Evaluation of current treatment plan related to Chronic pain from migraine and osteoarthritis and patient's adherence to plan as established by provider. Provided education to patient re: Chronic pain from migraine and osteoarthritis Reviewed medications with patient and discussed adherence to medications  Reviewed scheduled/upcoming provider appointments including:  Pharmacy referral for cost of Nurtec and scheduled for 09/28/21 Discussed plans with patient for ongoing care management follow up and provided patient with direct contact information for care management team Reviewed to cal RNCM with their household income to give to [facility] PharmD to see they they will qualify for pharmacy assistance Patient Goals/Self Care Activities:  Will self-administer medications as prescribed Will attend all scheduled provider appointments Will call pharmacy for medication refills 7 days prior to needed refill date Patient will calls provider office for new concerns or questions Follow Up Plan: Telephone follow up appointment with care management team member scheduled for: 08/31/21 at 9:45 AM The patient has been provided with contact information for the care management team and has been advised to call with any health related questions or concerns.       Patient Care Plan: Lack of long term self managment of Hypertension     Problem  Identified: Hypertension (Hypertension)      Long-Range Goal: Effective  long term self managment of Hypertension   Start Date: 07/20/2021  Expected End Date: 01/04/2022  This Visit's Progress: On track  Priority: Medium  Note:   Current Barriers:  Knowledge Deficits related to basic understanding of hypertension pathophysiology and self care management Unable to independently self manage Hypertension Does not adhere to prescribed medication regimen States she checks her B/P about 3 times a week and it was 144/75 today.  States she stopped taking the Valsartan because it made her legs hurt.  States she try to eat health but she is on a Keto diet with more meats.  States she is walking for exercise and would like to lose 10 more lbs Nurse Case Manager Clinical Goal(s):  patient will verbalize understanding of plan for hypertension management patient will not experience hospital admission. Hospital Admissions in last 6 months = 0 patient will attend all scheduled medical appointments: CCM PharmD 09/28/21 patient will demonstrate improved adherence to prescribed treatment plan for hypertension as evidenced by taking all medications as prescribed, monitoring and recording blood pressure as directed, adhering to low sodium/DASH diet patient will demonstrate improved health management independence as evidenced by checking blood pressure as directed and notifying PCP if SBP>160 or DBP > 90, taking all medications as prescribe, and adhering to a low sodium diet as discussed. patient will verbalize basic understanding of hypertension disease process and self health management plan as evidenced by readings within limits, adherence to diet and medications Interventions:  Collaboration with Midge Minium, MD regarding development and update of comprehensive plan of care as evidenced by provider attestation and co-signature Inter-disciplinary care team collaboration (see  longitudinal plan of  care) Evaluation of current treatment plan related to hypertension self management and patient's adherence to plan as established by provider. Provided education to patient re: stroke prevention, s/s of heart attack and stroke, DASH diet, complications of uncontrolled blood pressure Reviewed medications with patient and discussed importance of compliance Discussed plans with patient for ongoing care management follow up and provided patient with direct contact information for care management team Advised patient, providing education and rationale, to monitor blood pressure daily and record, calling PCP for findings outside established parameters.  Reviewed scheduled/upcoming provider appointments including: CCM PharmD 09/28/21 Self-Care Activities:  Self administers medications as prescribed Attends all scheduled provider appointments Calls pharmacy for medication refills Calls provider office for new concerns or questions Patient Goals  - Self administer medications as prescribed  - Attend all scheduled provider appointments  - Call provider office for new concerns, questions, or BP outside discussed parameters  - Check BP and record as discussed  - Follows a low sodium diet/DASH diet - check blood pressure 3 times per week - choose a place to take my blood pressure (home, clinic or office, retail store) - write blood pressure results in a log or diary - ask questions to understand - change to whole grain breads, cereal, pasta - drink 6 to 8 glasses of water each day - eat fish at least once per week - fill half of plate with vegetables - limit fast food meals to no more than 1 per week - prepare main meal at home 3 to 5 days each week - read food labels for fat, fiber, carbohydrates and portion size - reduce red meat to 2 to 3 times a week - switch to low-fat or skim milk - switch to sugar-free drinks Follow Up Plan: Telephone follow up appointment with care management team member  scheduled for: 08/31/21 at 9:45 AM The patient has been provided with contact information for the care management team and has been advised to call with any health related questions or concerns.

## 2021-07-20 NOTE — Progress Notes (Signed)
  Chronic Care Management   Note  07/20/2021 Name: ANGELEA MCNELL MRN: [id number] DOB: 08-17-1934  ANGELAMARIE CSIZMADIA is a 87 y.o. year old female who is a primary care patient of Birdie Riddle, Aundra Millet, MD. I reached out to [name] by phone today in response to a referral sent by Ms. Midge Minium Rosander's PCP, Midge Minium, MD.   Ms. Perris was given information about Chronic Care Management services today including:  CCM service includes personalized support from designated clinical staff supervised by her physician, including individualized plan of care and coordination with other care providers 24/7 contact phone numbers for assistance for urgent and routine care needs. Service will only be billed when office clinical staff spend 20 minutes or more in a month to coordinate care. Only one practitioner may furnish and bill the service in a calendar month. The patient may stop CCM services at any time (effective at the end of the month) by phone call to the office staff.   Patient agreed to services and verbal consent obtained.   Follow up plan:   Tatjana Secretary/administrator

## 2021-07-22 ENCOUNTER — Telehealth: Payer: Self-pay

## 2021-07-22 NOTE — Telephone Encounter (Signed)
Pt declined as she states she had CPE already... I explained to pt the difference and she still declined

## 2021-07-26 ENCOUNTER — Telehealth: Payer: Self-pay | Admitting: Family Medicine

## 2021-07-26 DIAGNOSIS — M1712 Unilateral primary osteoarthritis, left knee: Secondary | ICD-10-CM | POA: Diagnosis not present

## 2021-07-26 NOTE — Telephone Encounter (Signed)
Spoke with Ann Conley and we are limited on what we can prescribe for her. We could proceed with once a month injection (Ajovy). Alternatively [name] is another option. All of these may have problems with coverage

## 2021-07-26 NOTE — Telephone Encounter (Signed)
Called the patient back and reviewed these options. Discussed Ajovy and Emgality injections. Discussed there is a [email or web address] foundation that she may can see if she benefits for assistance during the time in donut hole. She will complete research and call us back.

## 2021-07-26 NOTE — Telephone Encounter (Signed)
Pt called stating that she is not able to get the Rimegepant Sulfate (NURTEC) 75 MG TBDP due to it being $1000 for 8 pills. Pt would like to know if something else can be prescribed to her.

## 2021-08-02 ENCOUNTER — Telehealth: Payer: Self-pay

## 2021-08-02 ENCOUNTER — Ambulatory Visit (INDEPENDENT_AMBULATORY_CARE_PROVIDER_SITE_OTHER): Payer: Self-pay | Admitting: Neurology

## 2021-08-02 DIAGNOSIS — Z0289 Encounter for other administrative examinations: Secondary | ICD-10-CM

## 2021-08-02 DIAGNOSIS — G43109 Migraine with aura, not intractable, without status migrainosus: Secondary | ICD-10-CM

## 2021-08-02 MED ORDER — EMGALITY 120 MG/ML ~~LOC~~ SOAJ: 120.0000 mg | SUBCUTANEOUS | 5 refills | Status: DC

## 2021-08-02 MED ORDER — EMGALITY 120 MG/ML ~~LOC~~ SOAJ: 240.0000 mg | Freq: Once | SUBCUTANEOUS | 0 refills | Status: AC

## 2021-08-02 NOTE — Progress Notes (Signed)
Provided the patient Emgality injection in the LLQ. Educated on how to administer the medication. Advised the patient Ann Conley had already been completed and we will send a script to the pharmacy. Pt verbalized understanding.

## 2021-08-02 NOTE — Telephone Encounter (Signed)
Called the patient back. We discussed the injections and advised that we may have better luck with getting the Emgality covered and if not [employer] foundation. Advised that we could start the patient on emgality and provide her

## 2021-08-02 NOTE — Telephone Encounter (Signed)
PA approved Effective from 08/02/2021 through 08/02/2022

## 2021-08-02 NOTE — Telephone Encounter (Signed)
Advised we could provide her with emgality starter dose here in the office. This will allow [place] to also educate her on how to administer. We will enter the script in and start a PA for the pt to see about coverage.

## 2021-08-02 NOTE — Addendum Note (Signed)
Addended by: Darleen Crocker on: 08/02/2021 04:27 PM   Modules accepted: Orders

## 2021-08-02 NOTE — Telephone Encounter (Signed)
Submitted PA Emgality on CMM.  Key: [id number].  Waiting on determination from [facility].   If [employer] [place] has not responded within the specified timeframe or if you have any questions about your PA submission, contact [place] directly at [facility]) [phone] or ([place]) [phone].

## 2021-08-02 NOTE — Telephone Encounter (Signed)
Pt called says she is not taking anything right now for her migraines but wants to know should she start taking a monthly medication. Pt requesting a call back.

## 2021-08-05 DIAGNOSIS — I10 Essential (primary) hypertension: Secondary | ICD-10-CM | POA: Diagnosis not present

## 2021-08-05 DIAGNOSIS — M170 Bilateral primary osteoarthritis of knee: Secondary | ICD-10-CM | POA: Diagnosis not present

## 2021-08-16 ENCOUNTER — Ambulatory Visit (INDEPENDENT_AMBULATORY_CARE_PROVIDER_SITE_OTHER): Payer: Medicare Other | Admitting: Registered Nurse

## 2021-08-16 ENCOUNTER — Other Ambulatory Visit: Payer: Self-pay

## 2021-08-16 VITALS — BP 156/78 | HR 87 | Temp 98.1°F | Resp 16 | Ht 60.0 in | Wt 216.6 lb

## 2021-08-16 DIAGNOSIS — I10 Essential (primary) hypertension: Secondary | ICD-10-CM

## 2021-08-16 MED ORDER — AMLODIPINE BESY-BENAZEPRIL HCL 10-20 MG PO CAPS: 1.0000 | ORAL_CAPSULE | Freq: Every day | ORAL | 0 refills | Status: DC

## 2021-08-16 NOTE — Patient Instructions (Signed)
Mrs. And Mr. Gracemarie Skeet to meet you both.  Let me know how things go with the new medication  Return for a blood pressure check in 1-2 weeks. Nurse visit only  As for your granddaughter - it gets better! Older nurses can be quite cold to new ones, but once you get some experience, it gets better.  Also, if looking into graduate programs, check out [place]. I definitely recommend it.  Thank you  Rich

## 2021-08-16 NOTE — Progress Notes (Addendum)
Established Patient Office Visit  Subjective:  Patient ID: Ann Conley, female    DOB: 1934/07/01  Age: 87 y.o. MRN: [id number]  CC:  Chief Complaint  Patient presents with   Hypertension    Pt reports 169/97 Sunday evening, 151/90 yesterday has had some headache,   Migraine    Pt reports worse recently possibly related to BP reports change in sxs, will get a throbbing feeling in temples     HPI Ann Conley presents for HTN, migraine  Some high readings at home  Hypertension: Patient Currently taking: amlodipine 10mg  po qd.  Good effect. No AEs. Denies CV symptoms including: chest pain, shob, doe, headache, visual changes, fatigue, claudication, and dependent edema.   Previous readings and labs: BP Readings from Last 3 Encounters:  08/16/21 (!) 156/78  07/14/21 (!) 158/86  04/28/21 120/70   Lab Results  Component Value Date   CREATININE 0.56 03/17/2021   Thinks her high bp is worsening her migraines. Concern that her amlodipine is expired Had been on beta blocker in the past.    Past Medical History:  Diagnosis Date   Anemia    Blood transfusion complicating pregnancy 4656   GERD (gastroesophageal reflux disease)    History of chicken pox    Hypertension    Migraine    occular   TIA (transient ischemic attack)     Past Surgical History:  Procedure Laterality Date   BREAST BIOPSY     CHOLECYSTECTOMY     PARTIAL HYSTERECTOMY      Family History  Problem Relation Age of Onset   Breast cancer Mother    Heart disease Father    Heart disease Sister        pacemaker   Diabetes Sister     Social History   Socioeconomic History   Marital status: Married    Spouse name: Not on file   Number of children: Not on file   Years of education: Not on file   Highest education level: Not on file  Occupational History   Not on file  Tobacco Use   Smoking status: Never   Smokeless tobacco: Never  Vaping Use   Vaping Use: Never used  Substance  and Sexual Activity   Alcohol use: Yes    Alcohol/week: 0.0 standard drinks    Comment: wine occ.   Drug use: No   Sexual activity: Not on file  Other Topics Concern   Not on file  Social History Narrative   Lives with husband.  5 children.  Education BA.   Social Determinants of Health   Financial Resource Strain: Low Risk    Difficulty of Paying Living Expenses: Not hard at all  Food Insecurity: No Food Insecurity   Worried About Charity fundraiser in the Last Year: Never true   [place] in the Last Year: Never true  Transportation Needs: No Transportation Needs   Lack of Transportation (Medical): No   Lack of Transportation (Non-Medical): No  Physical Activity: Not on file  Stress: No Stress Concern Present   Feeling of Stress : Not at all  Social Connections: Not on file  Intimate Partner Violence: Not on file    Outpatient Medications Prior to Visit  Medication Sig Dispense Refill   amLODipine (NORVASC) 10 MG tablet TAKE ONE TABLET BY MOUTH DAILY 90 tablet 1   Ascorbic Acid (VITAMIN C PO) Take by mouth.     aspirin 81 MG tablet Take 81  mg by mouth every evening.      aspirin-acetaminophen-caffeine (EXCEDRIN MIGRAINE) 250-250-65 MG tablet Take by mouth every 6 (six) hours as needed for headache.     furosemide (LASIX) 20 MG tablet Take 1 tablet (20 mg total) by mouth daily. 30 tablet 3   Galcanezumab-gnlm (EMGALITY) 120 MG/ML SOAJ Inject 120 mg into the skin every 30 (thirty) days. 1 mL 5   ibuprofen (ADVIL) 800 MG tablet TAKE ONE TABLET BY MOUTH EVERY 8 HOURS AS NEEDED FOR HEADACHE 30 tablet 0   Magnesium 250 MG TABS Take 250 mg by mouth daily.     multivitamin-lutein (OCUVITE-LUTEIN) CAPS capsule Take 1 capsule by mouth daily.     Rimegepant Sulfate (NURTEC) 75 MG TBDP Take 75 mg by mouth daily as needed (take for abortive therapy of migraine, no more than 1 tablet in 24 hours or 10 per month). 8 tablet 11   Rimegepant Sulfate (NURTEC) 75 MG TBDP Take 75 mg by  mouth every other day. 16 tablet 0   vitamin B-12 (CYANOCOBALAMIN) 100 MCG tablet Take 100 mcg by mouth daily.     omeprazole (PRILOSEC) 20 MG capsule Take 1 capsule (20 mg total) by mouth daily. (Patient taking differently: Take 20 mg by mouth as needed.) 90 capsule 1   No facility-administered medications prior to visit.    Allergies  Allergen Reactions   Codeine Nausea And Vomiting    ROS Review of Systems  Constitutional: Negative.   HENT: Negative.    Eyes: Negative.   Respiratory: Negative.    Cardiovascular: Negative.   Gastrointestinal: Negative.   Genitourinary: Negative.   Musculoskeletal: Negative.   Skin: Negative.   Neurological: Negative.   Psychiatric/Behavioral: Negative.    All other systems reviewed and are negative.    Objective:    Physical Exam Vitals and nursing note reviewed.  Constitutional:      General: She is not in acute distress.    Appearance: Normal appearance. She is normal weight. She is not ill-appearing, toxic-appearing or diaphoretic.  Cardiovascular:     Rate and Rhythm: Normal rate and regular rhythm.     Heart sounds: Normal heart sounds. No murmur heard.   No friction rub. No gallop.  Pulmonary:     Effort: Pulmonary effort is normal. No respiratory distress.     Breath sounds: Normal breath sounds. No stridor. No wheezing, rhonchi or rales.  Chest:     Chest wall: No tenderness.  Skin:    General: Skin is warm and dry.  Neurological:     General: No focal deficit present.     Mental Status: She is alert and oriented to person, place, and time. Mental status is at baseline.  Psychiatric:        Mood and Affect: Mood normal.        Behavior: Behavior normal.        Thought Content: Thought content normal.        Judgment: Judgment normal.    BP (!) 156/78   Pulse 87   Temp 98.1 F (36.7 C) (Temporal)   Resp 16   Ht 5' (1.524 m)   Wt 216 lb 9.6 oz (98.2 kg)   SpO2 97%   BMI 42.30 kg/m  Wt Readings from Last 3  Encounters:  08/16/21 216 lb 9.6 oz (98.2 kg)  07/14/21 220 lb (99.8 kg)  04/28/21 224 lb (101.6 kg)     Health Maintenance Due  Topic Date Due   Zoster Vaccines- Shingrix (  1 of 2) Never done   DEXA SCAN  Never done   COVID-19 Vaccine (3 - Booster for Moderna series) 07/25/2020    There are no preventive care reminders to display for this patient.  Lab Results  Component Value Date   TSH 2.16 03/17/2021   Lab Results  Component Value Date   WBC 6.6 03/17/2021   HGB 12.5 03/17/2021   HCT 37.2 03/17/2021   MCV 87.2 03/17/2021   PLT 247.0 03/17/2021   Lab Results  Component Value Date   NA 137 03/17/2021   K 4.5 03/17/2021   CO2 31 03/17/2021   GLUCOSE 92 03/17/2021   BUN 20 03/17/2021   CREATININE 0.56 03/17/2021   BILITOT 0.4 03/17/2021   ALKPHOS 85 03/17/2021   AST 15 03/17/2021   ALT 12 03/17/2021   PROT 7.1 03/17/2021   ALBUMIN 4.2 03/17/2021   CALCIUM 9.5 03/17/2021   ANIONGAP 9 01/27/2021   GFR 83.81 03/17/2021   Lab Results  Component Value Date   CHOL 181 03/17/2021   Lab Results  Component Value Date   HDL 62.90 03/17/2021   Lab Results  Component Value Date   LDLCALC 82 03/17/2021   Lab Results  Component Value Date   TRIG 178.0 (H) 03/17/2021   Lab Results  Component Value Date   CHOLHDL 3 03/17/2021   Lab Results  Component Value Date   HGBA1C 5.7 09/12/2019      Assessment & Plan:   Problem List Items Addressed This Visit       Cardiovascular and Mediastinum   Essential hypertension - Primary   Relevant Medications   amLODipine-benazepril (LOTREL) 10-20 MG capsule   Other Visit Diagnoses     Uncontrolled hypertension       Relevant Medications   amLODipine-benazepril (LOTREL) 10-20 MG capsule       Meds ordered this encounter  Medications   amLODipine-benazepril (LOTREL) 10-20 MG capsule    Sig: Take 1 capsule by mouth daily.    Dispense:  90 capsule    Refill:  0    Order Specific Question:   Supervising  Provider    Answer:   Carlota Raspberry, JEFFREY R [[id number]]    Follow-up: Return in about 2 weeks (around 08/30/2021) for nurse visit bp check.   PLAN Chronic hypertension currently uncontrolled, warranting medication management. Stop amlodipine. Start amlodipine-benazepril. Return for bp check in 1-2 week, nurse visit. Return to PCP as scheduled Patient encouraged to call clinic with any questions, comments, or concerns.  Maximiano Coss, NP

## 2021-08-18 NOTE — Addendum Note (Signed)
Addended by: Maximiano Coss on: 08/18/2021 05:56 PM   Modules accepted: Level of Service

## 2021-08-29 ENCOUNTER — Telehealth: Payer: Self-pay | Admitting: Family Medicine

## 2021-08-29 NOTE — Telephone Encounter (Signed)
Called pt back. She is working on form and wanting to drop [employer] app for [employer] off to our office once complete so we can then complete prescriber portion and fax in at that point. She is due for next emgality shot 09/01/21. PA approved but med still expenseive. I set aside med sample in fridge and she will pick up tomorrow. She will call back if any further questions/concerns.

## 2021-08-29 NOTE — Telephone Encounter (Signed)
Pt is asking for a call, she has questions HE:[employer]

## 2021-08-29 NOTE — Telephone Encounter (Signed)
Patient wants you to contact her about medication problems

## 2021-08-29 NOTE — Telephone Encounter (Cosign Needed)
Called and LM for patient to return call to discuss her medication concerns./ls,CMA

## 2021-08-30 ENCOUNTER — Ambulatory Visit (INDEPENDENT_AMBULATORY_CARE_PROVIDER_SITE_OTHER): Payer: Medicare Other | Admitting: Family Medicine

## 2021-08-30 ENCOUNTER — Other Ambulatory Visit: Payer: Self-pay

## 2021-08-30 DIAGNOSIS — I10 Essential (primary) hypertension: Secondary | ICD-10-CM | POA: Diagnosis not present

## 2021-08-30 NOTE — Progress Notes (Addendum)
Patient presents to the office for a blood pressure check. Patient blood pressure reading was 120/70 after sitting for 5 minutes. Patient also stated that she has been checking at home and they were also reading at 120/70s range. Patient did not present no dizziness and stated no headaches.

## 2021-08-30 NOTE — Telephone Encounter (Signed)
Pt came and picked up sample of Emgality (1 box). Provided printed patient instructions as well. Pt will call if she has any further questions. Aware to take 1 pen/month.

## 2021-08-31 ENCOUNTER — Telehealth: Payer: Medicare Other

## 2021-09-01 ENCOUNTER — Telehealth: Payer: Self-pay | Admitting: Family Medicine

## 2021-09-01 NOTE — Telephone Encounter (Signed)
Pt called, not comfortable giving myself the  Galcanezumab-gnlm (EMGALITY) 120 MG/ML SOAJ injection. Would like a call from the nurse to discuss if could come by the office.

## 2021-09-01 NOTE — Telephone Encounter (Signed)
Called the patient and advised that it would be fine if she would like to bring her injection to the office to educate her on how to administer. Pt will be in today at 4:30

## 2021-09-05 NOTE — Progress Notes (Signed)
    Chronic Care Management Pharmacy Assistant   Name: ROLLA SERVIDIO  MRN: [id number] DOB: December 30, 1933   Reason for Encounter: CMA Phone Call    Patient returned my call in reference to her having questions about a medication. She reported she was recently prescribed Benazepril-Amlodipine on 08/16/21 and she feels she has since developed a dry hacky cough. She denies any fever, congestion, or ill contacts. She also denied any respiratory concerns or swelling of lips or tongue. She asked to have a message sent to CPP to discuss with PCP if this could be a possible side effect from this medication and if she should continue. She stated she did read that this was on of the possible side effects listed. Patient aware of message sent and of turnaround time. Patient aware she will receive a follow up call once PCP has addressed and advised. Patient also instructed to call if any emergent symptoms developed or to seek urgent medical attention. She voiced understanding./ls,cMA   Jobe Gibbon, [place] Pharmacist Assistant  [phone]  Time Spent:  10 minutes

## 2021-09-05 NOTE — Telephone Encounter (Signed)
Called and LM for patient again to call if she still had questions in regards to her medications and I would be happy to assist her./ls,CMA

## 2021-09-07 ENCOUNTER — Ambulatory Visit: Payer: Medicare Other | Admitting: Family Medicine

## 2021-09-07 NOTE — Telephone Encounter (Signed)
Reviewed cough as potential side effect with patient, opted to hold off on any medication changes due to possible improvement in cough. Denies any additional side effects or symptoms.   Will reconsider change from benazepril-amlodipine to olmesartan 20 mg (half or whole tablet) + amlodipine 10 mg during 11/23 CCM visit.

## 2021-09-09 ENCOUNTER — Other Ambulatory Visit: Payer: Self-pay

## 2021-09-09 DIAGNOSIS — K219 Gastro-esophageal reflux disease without esophagitis: Secondary | ICD-10-CM

## 2021-09-09 MED ORDER — OMEPRAZOLE 20 MG PO CPDR: 20.0000 mg | DELAYED_RELEASE_CAPSULE | Freq: Every day | ORAL | 1 refills | Status: DC

## 2021-09-14 ENCOUNTER — Telehealth: Payer: Medicare Other

## 2021-09-16 ENCOUNTER — Telehealth: Payer: Self-pay

## 2021-09-16 NOTE — Progress Notes (Signed)
Chronic Care Management Pharmacy Assistant   Name: Ann Conley  MRN: [id number] DOB: 1934-05-17   Ann Conley is an 87 y.o. year old female who presents for her initial CCM visit with the clinical pharmacist.  Reason for Encounter: Chart Prep for Initial Visit with CPP on 09/28/21 2:00 PM (in person)    Recent office visits:  08/30/21 Annye Asa, MD (PCP) - Family Medicine (Nurse visit) - Hypertension - Blood pressure check. No medication changes. Follow up as scheduled.   08/16/21 Maximiano Coss, NP - Family Medicine - Hypertension - amLODipine-benazepril (LOTREL) 10-20 MG capsule prescribed. Stop amlodipine. Start amlodipine-benazepril. Return for bp check in 1-2 week, nurse visit.  04/28/21 Annye Asa, MD (PCP) - Family Medicine - Bilateral Swelling of feet - Furosemide (LASIX) 20 MG tablet prescribed prn. Follow up as scheduled.   03/17/21 Areatha Keas, MD (PCP) - Family Medicine - Annual Physical Exam - Labs were ordered. Mammogram ordered. No medication changes. Follow up in 6 months.    Recent consult visits:  07/14/21 Debbora Presto, NP - Neurology - Migraine - recommended CGRP injection monthly for prevention but she wishes to hold off for now. She will continue Nurtec every other day. BP is a little elevated today but is much better than previous readings. I am concerned it may continue to increase as she has stopped propranolol and valsartan. She will continue close follow up with Dr Birdie Riddle. Follow up in 6 months.  Hospital visits:  None in previous 6 months  Medications: Outpatient Encounter Medications as of 09/16/2021  Medication Sig   amLODipine (NORVASC) 10 MG tablet TAKE ONE TABLET BY MOUTH DAILY   amLODipine-benazepril (LOTREL) 10-20 MG capsule Take 1 capsule by mouth daily.   Ascorbic Acid (VITAMIN C PO) Take by mouth.   aspirin 81 MG tablet Take 81 mg by mouth every evening.    aspirin-acetaminophen-caffeine (EXCEDRIN MIGRAINE) 250-250-65 MG  tablet Take by mouth every 6 (six) hours as needed for headache.   furosemide (LASIX) 20 MG tablet Take 1 tablet (20 mg total) by mouth daily.   Galcanezumab-gnlm (EMGALITY) 120 MG/ML SOAJ Inject 120 mg into the skin every 30 (thirty) days.   ibuprofen (ADVIL) 800 MG tablet TAKE ONE TABLET BY MOUTH EVERY 8 HOURS AS NEEDED FOR HEADACHE   Magnesium 250 MG TABS Take 250 mg by mouth daily.   multivitamin-lutein (OCUVITE-LUTEIN) CAPS capsule Take 1 capsule by mouth daily.   omeprazole (PRILOSEC) 20 MG capsule Take 1 capsule (20 mg total) by mouth daily.   Rimegepant Sulfate (NURTEC) 75 MG TBDP Take 75 mg by mouth daily as needed (take for abortive therapy of migraine, no more than 1 tablet in 24 hours or 10 per month).   Rimegepant Sulfate (NURTEC) 75 MG TBDP Take 75 mg by mouth every other day.   vitamin B-12 (CYANOCOBALAMIN) 100 MCG tablet Take 100 mcg by mouth daily.   No facility-administered encounter medications on file as of 09/16/2021.    Reviewed chart for medication changes ahead of medication coordination call.  No OVs, Consults, or hospital visits since last care coordination call/Pharmacist visit. (If appropriate, list visit date, provider name)  No medication changes indicated OR if recent visit, treatment plan here.  BP Readings from Last 3 Encounters:  08/16/21 (!) 156/78  07/14/21 (!) 158/86  04/28/21 120/70    Lab Results  Component Value Date   HGBA1C 5.7 09/12/2019     Have you seen any other providers since your last visit?  Any changes in your medications or health?   Any side effects from any medications?   Do you have an symptoms or problems not managed by your medications?   Any concerns about your health right now?   Has your provider asked that you check blood pressure, blood sugar, or follow special diet at home?   Do you get any type of exercise on a regular basis?   Can you think of a goal you would like to reach for your health?   Do you have  any problems getting your medications?   Is there anything that you would like to discuss during the appointment?    Please bring medications and supplements to appointment . Patient confirmed appointment date and time.    Care Gaps  AWV: done 07/22/21 Colonoscopy: done 11/17/02 DM Eye Exam: N/A DM Foot Exam: N/A Microalbumin: N/A HbgAIC: N/A DEXA: unknown  Mammogram: done 04/26/21   Star Rating Drugs: amLODipine-benazepril (LOTREL) 10-20 MG capsule - last filled 08/16/21 90 days    Future Appointments  Date Time Provider [place]  09/21/2021  2:45 PM [facility]M CARE [facility]  09/28/2021  2:00 PM [facility] PHARMACIST [facility]  01/12/2022  1:30 PM Lomax, Amy, NP [facility] None   Multiple attempts were made to contact patient. Attempts were unsuccessful. / ls,CMA   Jobe Gibbon, [place] Pharmacist Assistant  [phone]  Time Spent: 36 minutes

## 2021-09-21 ENCOUNTER — Ambulatory Visit (INDEPENDENT_AMBULATORY_CARE_PROVIDER_SITE_OTHER): Payer: Medicare Other

## 2021-09-21 DIAGNOSIS — G43109 Migraine with aura, not intractable, without status migrainosus: Secondary | ICD-10-CM

## 2021-09-21 DIAGNOSIS — M170 Bilateral primary osteoarthritis of knee: Secondary | ICD-10-CM

## 2021-09-21 DIAGNOSIS — I10 Essential (primary) hypertension: Secondary | ICD-10-CM

## 2021-09-21 NOTE — Patient Instructions (Signed)
Visit Information atient will self administer medications as prescribed Patient will attend all scheduled provider appointments Patient will call pharmacy for medication refills Patient will attend church or other social activities Patient will continue to perform ADL's independently Patient will continue to perform IADL's independently Patient will call provider office for new concerns or questions - check blood pressure daily - choose a place to take my blood pressure (home, clinic or office, retail store) - write blood pressure results in a log or diary - keep a blood pressure log - take blood pressure log to all doctor appointments - call doctor for signs and symptoms of high blood pressure - keep all doctor appointments - take medications for blood pressure exactly as prescribed - eat more whole grains, fruits and vegetables, lean meats and healthy fats - limit salt intake to 2300mg /day - practice acceptance of chronic pain - practice relaxation or meditation daily - tell myself I can (not I can't) - think of new ways to do favorite things - use distraction techniques - use relaxation during pain  The patient verbalized understanding of instructions, educational materials, and care plan provided today and declined offer to receive copy of patient instructions, educational materials, and care plan.   Telephone follow up appointment with care management team member scheduled for: 11/16/21 at 3:30 PM [place], [facility], CDE Care Management Coordinator [facility] [phone], Mobile [phone]

## 2021-09-21 NOTE — Chronic Care Management (AMB) (Signed)
Chronic Care Management   CCM RN Visit Note  09/21/2021 Name: Ann Conley MRN: [id number] DOB: Aug 03, 1934  Subjective: Ann Conley is a 87 y.o. year old female who is a primary care patient of Ann Conley, Ann Millet, MD. The care management team was consulted for assistance with disease management and care coordination needs.    Engaged with patient by telephone for follow up visit in response to provider referral for case management and/or care coordination services.   Consent to Services:  The patient was given information about Chronic Care Management services, agreed to services, and gave verbal consent prior to initiation of services.  Please see initial visit note for detailed documentation.   Patient agreed to services and verbal consent obtained.   Assessment: Review of patient past medical history, allergies, medications, health status, including review of consultants reports, laboratory and other test data, was performed as part of comprehensive evaluation and provision of chronic care management services.   SDOH (Social Determinants of Health) assessments and interventions performed:    CCM Care Plan  Allergies  Allergen Reactions   Codeine Nausea And Vomiting    Outpatient Encounter Medications as of 09/21/2021  Medication Sig   amLODipine (NORVASC) 10 MG tablet TAKE ONE TABLET BY MOUTH DAILY   amLODipine-benazepril (LOTREL) 10-20 MG capsule Take 1 capsule by mouth daily.   Ascorbic Acid (VITAMIN C PO) Take by mouth.   aspirin 81 MG tablet Take 81 mg by mouth every evening.    aspirin-acetaminophen-caffeine (EXCEDRIN MIGRAINE) 250-250-65 MG tablet Take by mouth every 6 (six) hours as needed for headache.   furosemide (LASIX) 20 MG tablet Take 1 tablet (20 mg total) by mouth daily.   Galcanezumab-gnlm (EMGALITY) 120 MG/ML SOAJ Inject 120 mg into the skin every 30 (thirty) days.   ibuprofen (ADVIL) 800 MG tablet TAKE ONE TABLET BY MOUTH EVERY 8 HOURS AS NEEDED  FOR HEADACHE   Magnesium 250 MG TABS Take 250 mg by mouth daily.   multivitamin-lutein (OCUVITE-LUTEIN) CAPS capsule Take 1 capsule by mouth daily.   omeprazole (PRILOSEC) 20 MG capsule Take 1 capsule (20 mg total) by mouth daily.   Rimegepant Sulfate (NURTEC) 75 MG TBDP Take 75 mg by mouth daily as needed (take for abortive therapy of migraine, no more than 1 tablet in 24 hours or 10 per month).   Rimegepant Sulfate (NURTEC) 75 MG TBDP Take 75 mg by mouth every other day.   vitamin B-12 (CYANOCOBALAMIN) 100 MCG tablet Take 100 mcg by mouth daily.   No facility-administered encounter medications on file as of 09/21/2021.    Patient Active Problem List   Diagnosis Date Noted   Primary stabbing headache 09/11/2018   Primary osteoarthritis of both knees 06/01/2017   Burning mouth syndrome 04/13/2016   Bleeding hemorrhoid 73/22/0254   Complicated migraine 27/04/2375   Keratoma 10/19/2014   Pain in lower limb 10/19/2014   Hammer toe of left foot 10/19/2014   Morbid obesity ([place]) 09/17/2014   Snoring 03/13/2013   Left anterior knee pain 03/13/2013   Mole (skin) 09/18/2011   Migraine with aura 05/07/2011   Essential hypertension 04/25/2011   GERD (gastroesophageal reflux disease) 04/25/2011   Annual physical exam 04/25/2011   BREAST CANCER, HX OF 06/21/2007    Conditions to be addressed/monitored:HTN, Osteoarthritis, and Migraine  Care Plan : Chronic pain from migraine and osteoarthritis  Updates made by Dimitri Ped, RN since 09/21/2021 12:00 AM  Completed 09/21/2021   Problem: Need for long term self management  of chronic pain from migraine and osteoarthritis Resolved 09/21/2021  Priority: High     Long-Range Goal: Manage Pain Completed 09/21/2021  Start Date: 07/20/2021  Expected End Date: 01/04/2022  Recent Progress: On track  Priority: High  Note:   Resolving due to duplicate goal  Current Barriers:  Knowledge Deficits related to self-health management of acute or  chronic pain from migraines and osteoarthritis Chronic Disease Management support and education needs related to chronic pain from migraine and osteoarthritis Knowledge Deficits related to Chronic pain from migraine and osteoarthritis Chronic Disease Management support and education needs related to Chronic pain from migraine and osteoarthritis Financial Constraints. Cost of Nurtec Unable to independently Chronic pain from migraine and osteoarthritis States that her migraine's are helped when she takes the Nurtec but she is in the doughnut hole and can not afford the medication now.  States she does take Excedrin and Motrin as neurology told her to take as needed.  States her lt knee may need to be replaced and she is to see orthopedics tomorrow.  States she has been working on weight loss by eating less and walking for exercise so that she can have surgery if needed Clinical Goal(s):  patient will verbalize understanding of plan for pain management. , patient will attend all scheduled medical appointments: CCM PharmD 09/28/21, neurology 01/12/21, orthopedics 07/21/21, patient will demonstrate use of different relaxation  skills and/or diversional activities to assist with pain reduction (distraction, imagery, relaxation, massage, acupressure, TENS, heat, and cold application., patient will report pain at a level less than 3 to 4 on a 10-10 rating scale., patient will use pharmacological and nonpharmacological pain relief strategies as prescribed. , and patient will verbalize acceptable level of pain relief and ability to engage in desired activities Interventions:  Collaboration with Midge Minium, MD regarding development and update of comprehensive plan of care as evidenced by provider attestation and co-signature Pain assessment performed Medications reviewed Discussed plans with patient for ongoing care management follow up and provided patient with direct contact information for care management  team Evaluation of current treatment plan related to Chronic pain from migraine and osteoarthritis and patient's adherence to plan as established by provider. Provided education to patient re: Chronic pain from migraine and osteoarthritis Reviewed medications with patient and discussed adherence to medications  Reviewed scheduled/upcoming provider appointments including:  Pharmacy referral for cost of Nurtec and scheduled for 09/28/21 Discussed plans with patient for ongoing care management follow up and provided patient with direct contact information for care management team Reviewed to cal RNCM with their household income to give to [facility] PharmD to see they they will qualify for pharmacy assistance Patient Goals/Self Care Activities:  Will self-administer medications as prescribed Will attend all scheduled provider appointments Will call pharmacy for medication refills 7 days prior to needed refill date Patient will calls provider office for new concerns or questions Follow Up Plan: Telephone follow up appointment with care management team member scheduled for: 08/31/21 at 9:45 AM The patient has been provided with contact information for the care management team and has been advised to call with any health related questions or concerns.       Care Plan : Lack of long term self managment of Hypertension  Updates made by Dimitri Ped, RN since 09/21/2021 12:00 AM  Completed 09/21/2021   Problem: Hypertension (Hypertension) Resolved 09/21/2021     Long-Range Goal: Effective  long term self managment of Hypertension Completed 09/21/2021  Start Date: 07/20/2021  Expected  End Date: 01/04/2022  Recent Progress: On track  Priority: Medium  Note:   Resolving due to duplicate goal  Current Barriers:  Knowledge Deficits related to basic understanding of hypertension pathophysiology and self care management Unable to independently self manage Hypertension Does not adhere to prescribed  medication regimen States she checks her B/P about 3 times a week and it was 144/75 today.  States she stopped taking the Valsartan because it made her legs hurt.  States she try to eat health but she is on a Keto diet with more meats.  States she is walking for exercise and would like to lose 10 more lbs Nurse Case Manager Clinical Goal(s):  patient will verbalize understanding of plan for hypertension management patient will not experience hospital admission. Hospital Admissions in last 6 months = 0 patient will attend all scheduled medical appointments: CCM PharmD 09/28/21 patient will demonstrate improved adherence to prescribed treatment plan for hypertension as evidenced by taking all medications as prescribed, monitoring and recording blood pressure as directed, adhering to low sodium/DASH diet patient will demonstrate improved health management independence as evidenced by checking blood pressure as directed and notifying PCP if SBP>160 or DBP > 90, taking all medications as prescribe, and adhering to a low sodium diet as discussed. patient will verbalize basic understanding of hypertension disease process and self health management plan as evidenced by readings within limits, adherence to diet and medications Interventions:  Collaboration with Midge Minium, MD regarding development and update of comprehensive plan of care as evidenced by provider attestation and co-signature Inter-disciplinary care team collaboration (see longitudinal plan of care) Evaluation of current treatment plan related to hypertension self management and patient's adherence to plan as established by provider. Provided education to patient re: stroke prevention, s/s of heart attack and stroke, DASH diet, complications of uncontrolled blood pressure Reviewed medications with patient and discussed importance of compliance Discussed plans with patient for ongoing care management follow up and provided patient with  direct contact information for care management team Advised patient, providing education and rationale, to monitor blood pressure daily and record, calling PCP for findings outside established parameters.  Reviewed scheduled/upcoming provider appointments including: CCM PharmD 09/28/21 Self-Care Activities:  Self administers medications as prescribed Attends all scheduled provider appointments Calls pharmacy for medication refills Calls provider office for new concerns or questions Patient Goals  - Self administer medications as prescribed  - Attend all scheduled provider appointments  - Call provider office for new concerns, questions, or BP outside discussed parameters  - Check BP and record as discussed  - Follows a low sodium diet/DASH diet - check blood pressure 3 times per week - choose a place to take my blood pressure (home, clinic or office, retail store) - write blood pressure results in a log or diary - ask questions to understand - change to whole grain breads, cereal, pasta - drink 6 to 8 glasses of water each day - eat fish at least once per week - fill half of plate with vegetables - limit fast food meals to no more than 1 per week - prepare main meal at home 3 to 5 days each week - read food labels for fat, fiber, carbohydrates and portion size - reduce red meat to 2 to 3 times a week - switch to low-fat or skim milk - switch to sugar-free drinks Follow Up Plan: Telephone follow up appointment with care management team member scheduled for: 08/31/21 at 9:45 AM The patient has been provided  with contact information for the care management team and has been advised to call with any health related questions or concerns.      Care Plan : RN Care Manager Plan of Care  Updates made by Dimitri Ped, RN since 09/21/2021 12:00 AM     Problem: Chronic Disease Management and Care Coordination Needs (HTN, Migraine, osteoarthritis)   Priority: High     Long-Range  Goal: Establish Plan of Care for Chronic Disease Management Needs (HTN, Migraine, osteoarthritis)   Start Date: 09/21/2021  Expected End Date: 03/20/2022  Priority: High  Note:   Current Barriers:  Chronic Disease Management support and education needs related to HTN, Osteoarthritis, and Migraines States that her migraine's are better since she started taking the Emgality injection. States she does take Excedrin and Motrin as neurology told her to take as needed.  States her lt knee may need to be replaced and she is working to lose weight.  States she has been working on weight loss by eating less and walking for exercise so that she can have surgery if needed.  States her B/P has been better since she started the new medication the NP gave her on 10/11. States her last B/P reading was in the 130's but she does not remember the number.  States she is checking it about 3 times a week  RNCM Clinical Goal(s):  Patient will verbalize understanding of plan for management of HTN, Osteoarthritis, and Migraines verbalize basic understanding of  HTN, Osteoarthritis, and Migraines disease process and self health management plan . take all medications exactly as prescribed and will call provider for medication related questions attend all scheduled medical appointments: CCM PharmD 09/28/21,neurology 01/12/22 demonstrate Improved adherence to prescribed treatment plan for HTN, Osteoarthritis, and Migraines as evidenced by reading within limits, reports of improved pain management, voices adherence to plan of care continue to work with RN Care Manager to address care management and care coordination needs related to  HTN, Osteoarthritis, and Migraines work with pharmacist to address Financial constraints related to cost of medications related toHTN, Osteoarthritis, and Migraines  through collaboration with Consulting civil engineer, provider, and care team.   Interventions: 1:1 collaboration with primary care provider  regarding development and update of comprehensive plan of care as evidenced by provider attestation and co-signature Inter-disciplinary care team collaboration (see longitudinal plan of care) Evaluation of current treatment plan related to  self management and patient's adherence to plan as established by provider  Pain Interventions:Goal on track:  Yes Pain assessment performed Medications reviewed Reviewed provider established plan for pain management; Discussed importance of adherence to all scheduled medical appointments; Counseled on the importance of reporting any/all new or changed pain symptoms or management strategies to pain management provider; Advised patient to report to care team affect of pain on daily activities; Discussed use of relaxation techniques and/or diversional activities to assist with pain reduction (distraction, imagery, relaxation, massage, acupressure, TENS, heat, and cold application; Reviewed with patient prescribed pharmacological and nonpharmacological pain relief strategies;  Hypertension Interventions:Goal on track:  Yes Last practice recorded BP readings:  BP Readings from Last 3 Encounters:  08/16/21 (!) 156/78  07/14/21 (!) 158/86  04/28/21 120/70  Most recent eGFR/CrCl: No results found for: EGFR  No components found for: CRCL  Evaluation of current treatment plan related to hypertension self management and patient's adherence to plan as established by provider; Provided education to patient re: stroke prevention, s/s of heart attack and stroke; Reviewed medications with patient and  discussed importance of compliance; Counseled on the importance of exercise goals with target of 150 minutes per week Discussed plans with patient for ongoing care management follow up and provided patient with direct contact information for care management team; Advised patient, providing education and rationale, to monitor blood pressure daily and record, calling PCP  for findings outside established parameters;  Provided education on prescribed diet low sodium DASH diet;    Patient Goals/Self-Care Activities: Patient will self administer medications as prescribed Patient will attend all scheduled provider appointments Patient will call pharmacy for medication refills Patient will attend church or other social activities Patient will continue to perform ADL's independently Patient will continue to perform IADL's independently Patient will call provider office for new concerns or questions - check blood pressure daily - choose a place to take my blood pressure (home, clinic or office, retail store) - write blood pressure results in a log or diary - keep a blood pressure log - take blood pressure log to all doctor appointments - call doctor for signs and symptoms of high blood pressure - keep all doctor appointments - take medications for blood pressure exactly as prescribed - eat more whole grains, fruits and vegetables, lean meats and healthy fats - limit salt intake to 2350m/day - practice acceptance of chronic pain - practice relaxation or meditation daily - tell myself I can (not I can't) - think of new ways to do favorite things - use distraction techniques - use relaxation during pain Follow Up Plan:  Telephone follow up appointment with care management team member scheduled for:  11/16/21 The patient has been provided with contact information for the care management team and has been advised to call with any health related questions or concerns.         Plan:Telephone follow up appointment with care management team member scheduled for:  11/16/21 The patient has been provided with contact information for the care management team and has been advised to call with any health related questions or concerns.  MPeter GarterRN, BSN,CCM, CDE Care Management Coordinator [facility]([phone] Mobile [phone]

## 2021-09-28 ENCOUNTER — Ambulatory Visit: Payer: Medicare Other

## 2021-10-05 DIAGNOSIS — I10 Essential (primary) hypertension: Secondary | ICD-10-CM

## 2021-10-05 DIAGNOSIS — M170 Bilateral primary osteoarthritis of knee: Secondary | ICD-10-CM

## 2021-10-06 ENCOUNTER — Other Ambulatory Visit: Payer: Self-pay

## 2021-10-06 DIAGNOSIS — G43109 Migraine with aura, not intractable, without status migrainosus: Secondary | ICD-10-CM

## 2021-10-06 MED ORDER — IBUPROFEN 800 MG PO TABS: ORAL_TABLET | ORAL | 0 refills | Status: DC

## 2021-10-17 NOTE — Progress Notes (Signed)
Chronic Care Management Pharmacy Note  10/19/2021 Name:  Ann Conley MRN:  [id number] DOB:  12-17-1933  Summary: Initial visit with PharmD.  Meds updated.  BP elevated during call 176/98 and 155/79.  Had not taken her BP med yet today.  Now taking amlodipine/benazepril 10-73m.  Also needs help with copay on Emgality which is being handles by [employer].  Recommendations/Changes made from today's visit: Follow BP closely - asked her to monitor.  If remains elevated could increase benazepril to 442m FU on LiAssurantPlan: CMA 2 weeks BP PharmD 3 months   Subjective: Ann Conley an [age].o. year old female who is a primary patient of Ann Conley.  The CCM team was consulted for assistance with disease management and care coordination needs.    Engaged with patient face to face for initial visit in response to provider referral for pharmacy case management and/or care coordination services.   Consent to Services:  The patient was given the following information about Chronic Care Management services today, agreed to services, and gave verbal consent: 1. CCM service includes personalized support from designated clinical staff supervised by the primary care provider, including individualized plan of care and coordination with other care providers 2. 24/7 contact phone numbers for assistance for urgent and routine care needs. 3. Service will only be billed when office clinical staff spend 20 minutes or more in a month to coordinate care. 4. Only one practitioner may furnish and bill the service in a calendar month. 5.The patient may stop CCM services at any time (effective at the end of the month) by phone call to the office staff. 6. The patient will be responsible for cost sharing (co-pay) of up to 20% of the service fee (after annual deductible is met). Patient agreed to services and consent obtained.  Patient Care Team: Ann Conley as PCP -  General (Family Medicine) Ann Conley as Consulting Physician (Cardiology) Ann Conley as Consulting Physician (Neurology) Ann Conley as Consulting Physician (Ophthalmology) Ann Conley as Case Manager [facility]  Recent office visits:  08/30/21 Ann Conley (PCP) - Family Medicine (Nurse visit) - Hypertension - Blood pressure check. No medication changes. Follow up as scheduled.    08/16/21 Ann Conley - Family Medicine - Hypertension - amLODipine-benazepril (LOTREL) 10-20 MG capsule prescribed. Stop amlodipine. Start amlodipine-benazepril. Return for bp check in 1-2 week, nurse visit.   04/28/21 Ann Conley (PCP) - Family Medicine - Bilateral Swelling of feet - Furosemide (LASIX) 20 MG tablet prescribed prn. Follow up as scheduled.    03/17/21 KaAreatha KeasMD (PCP) - Family Medicine - Annual Physical Exam - Labs were ordered. Mammogram ordered. No medication changes. Follow up in 6 months.      Recent consult visits:  07/14/21 Ann Conley - Neurology - Migraine - recommended CGRP injection monthly for prevention but she wishes to hold off for now. She will continue Nurtec every other day. BP is a little elevated today but is much better than previous readings. I am concerned it may continue to increase as she has stopped propranolol and valsartan. She will continue close follow up with Dr TaBirdie RiddleFollow up in 6 months.   Hospital visits:  None in previous 6 months   Objective:  Lab Results  Component Value Date   CREATININE 0.56 03/17/2021   BUN 20 03/17/2021   GFR 83.81  03/17/2021   GFRNONAA >60 01/27/2021   GFRAA >60 08/10/2018   NA 137 03/17/2021   K 4.5 03/17/2021   CALCIUM 9.5 03/17/2021   CO2 31 03/17/2021   GLUCOSE 92 03/17/2021    Lab Results  Component Value Date/Time   HGBA1C 5.7 09/12/2019 11:36 AM   HGBA1C 5.6 09/11/2018 09:34 AM   GFR 83.81 03/17/2021 02:07 PM   GFR  123.39 03/09/2020 11:04 AM    Last diabetic Eye exam: No results found for: HMDIABEYEEXA  Last diabetic Foot exam: No results found for: HMDIABFOOTEX   Lab Results  Component Value Date   CHOL 181 03/17/2021   HDL 62.90 03/17/2021   LDLCALC 82 03/17/2021   LDLDIRECT 125.0 09/09/2012   TRIG 178.0 (H) 03/17/2021   CHOLHDL 3 03/17/2021    Hepatic Function Latest Ref Rng & Units 03/17/2021 01/27/2021 03/09/2020  Total Protein 6.0 - 8.3 g/dL 7.1 7.5 6.6  Albumin 3.5 - 5.2 g/dL 4.2 3.9 3.6  AST 0 - 37 U/L _0 ALT 0 - 35 U/L 12 20 36(H)  Alk Phosphatase 39 - 117 U/L 85 82 90  Total Bilirubin 0.2 - 1.2 mg/dL 0.4 0.3 0.4  Bilirubin, Direct 0.0 - 0.3 mg/dL 0.0 - 0.1    Lab Results  Component Value Date/Time   TSH 2.16 03/17/2021 02:07 PM   TSH 2.43 03/09/2020 11:04 AM    CBC Latest Ref Rng & Units 03/17/2021 01/27/2021 08/11/2020  WBC 4.0 - 10.5 K/uL 6.6 7.0 8.6  Hemoglobin 12.0 - 15.0 g/dL 12.5 13.1 12.7  Hematocrit 36.0 - 46.0 % 37.2 39.9 39.6  Platelets 150.0 - 400.0 K/uL 247.0 268 290    No results found for: VD25OH  Clinical ASCVD: No  The ASCVD Risk score (Arnett DK, et al., 2019) failed to calculate for the following reasons:   The 2019 ASCVD risk score is only valid for ages 8 to 6    Depression screen PHQ 2/9 08/16/2021 07/20/2021 04/28/2021  Decreased Interest 0 0 0  Down, Depressed, Hopeless 0 0 0  PHQ - 2 Score 0 0 0  Altered sleeping - - 0  Tired, decreased energy - - 0  Change in appetite - - 0  Feeling bad or failure about yourself  - - 0  Trouble concentrating - - 0  Moving slowly or fidgety/restless - - 0  Suicidal thoughts - - 0  PHQ-9 Score - - 0  Difficult doing work/chores - - Not difficult at all  Some recent data might be hidden    Social History   Tobacco Use  Smoking Status Never  Smokeless Tobacco Never   BP Readings from Last 3 Encounters:  08/16/21 (!) 156/78  07/14/21 (!) 158/86  04/28/21 120/70   Pulse Readings from Last 3  Encounters:  08/16/21 87  07/14/21 88  04/28/21 68   Wt Readings from Last 3 Encounters:  08/16/21 216 lb 9.6 oz (98.2 kg)  07/14/21 220 lb (99.8 kg)  04/28/21 224 lb (101.6 kg)   BMI Readings from Last 3 Encounters:  08/16/21 42.30 kg/m  07/14/21 42.97 kg/m  04/28/21 45.24 kg/m    Assessment/Interventions: Review of patient past medical history, allergies, medications, health status, including review of consultants reports, laboratory and other test data, was performed as part of comprehensive evaluation and provision of chronic care management services.   SDOH:  (Social Determinants of Health) assessments and interventions performed: Yes  Financial Resource Strain: Low Risk    Difficulty of Paying  Living Expenses: Not hard at all    SDOH Screenings   Alcohol Screen: Not on file  Depression (PHQ2-9): Low Risk    PHQ-2 Score: 0  Financial Resource Strain: Low Risk    Difficulty of Paying Living Expenses: Not hard at all  Food Insecurity: No Food Insecurity   Worried About Charity fundraiser in the Last Year: Never true   Ran Out of Food in the Last Year: Never true  Housing: Low Risk    Last Housing Risk Score: 0  Physical Activity: Not on file  Social Connections: Not on file  Stress: No Stress Concern Present   Feeling of Stress : Not at all  Tobacco Use: Low Risk    Smoking Tobacco Use: Never   Smokeless Tobacco Use: Never   Passive Exposure: Not on file  Transportation Needs: No Transportation Needs   Lack of Transportation (Medical): No   Lack of Transportation (Non-Medical): No    CCM Care Plan  Allergies  Allergen Reactions   Codeine Nausea And Vomiting    Medications Reviewed Today     Reviewed by Edythe Clarity, [facility] (Pharmacist) on 10/19/21 at [place] List Status: <None>   Medication Order Taking? Sig Documenting Provider Last Dose Status Informant  amLODipine-benazepril (LOTREL) 10-20 MG capsule [id number] Yes Take 1 capsule by mouth  daily. Maximiano Coss, NP Taking Active   Ascorbic Acid (VITAMIN C PO) [id number]  Take by mouth. [provider]  Active   aspirin 81 MG tablet [id number]  Take 81 mg by mouth every evening.  [provider]  Active Self  aspirin-acetaminophen-caffeine (EXCEDRIN MIGRAINE) [phone] MG tablet [id number]  Take by mouth every 6 (six) hours as needed for headache. [provider]  Active   furosemide (LASIX) 20 MG tablet [id number]  Take 1 tablet (20 mg total) by mouth daily. Midge Minium, MD  Active   Galcanezumab-gnlm [facility]) 120 MG/ML Darden Palmer [id number] Yes Inject 120 mg into the skin every 30 (thirty) days. Lomax, Amy, NP Taking Active   ibuprofen (ADVIL) 800 MG tablet [id number]  TAKE ONE TABLET BY MOUTH EVERY 8 HOURS AS NEEDED FOR HEADACHE Midge Minium, MD  Active   Magnesium 250 MG TABS [id number]  Take 250 mg by mouth daily. [provider]  Active   multivitamin-lutein [facility]) CAPS capsule [id number]  Take 1 capsule by mouth daily. [provider]  Active Self  omeprazole (PRILOSEC) 20 MG capsule [id number] Yes Take 1 capsule (20 mg total) by mouth daily. Midge Minium, MD Taking Active   Rimegepant Sulfate (NURTEC) 75 MG TBDP [id number] No Take 75 mg by mouth daily as needed (take for abortive therapy of migraine, no more than 1 tablet in 24 hours or 10 per month).  Patient not taking: Reported on 10/19/2021   Debbora Presto, NP Not Taking Active   Rimegepant Sulfate (NURTEC) 75 MG TBDP [id number] No Take 75 mg by mouth every other day.  Patient not taking: Reported on 10/19/2021   Debbora Presto, NP Not Taking Active   vitamin B-12 (CYANOCOBALAMIN) 100 MCG tablet [id number]  Take 100 mcg by mouth daily. [provider]  Active Self            Patient Active Problem List   Diagnosis Date Noted   Primary stabbing headache 09/11/2018   Primary osteoarthritis of both knees 06/01/2017   Burning mouth syndrome 04/13/2016    Bleeding hemorrhoid 81/11/7508   Complicated migraine 25/85/2778   Keratoma 10/19/2014  Pain in lower limb 10/19/2014   Hammer toe of left foot 10/19/2014   Morbid obesity ([place]) 09/17/2014   Snoring 03/13/2013   Left anterior knee pain 03/13/2013   Mole (skin) 09/18/2011   Migraine with aura 05/07/2011   Essential hypertension 04/25/2011   GERD (gastroesophageal reflux disease) 04/25/2011   Annual physical exam 04/25/2011   BREAST CANCER, HX OF 06/21/2007    Immunization History  Administered Date(s) Administered   Moderna Sars-Covid-2 Vaccination 01/23/2020, 02/23/2020   Pneumococcal Conjugate-13 09/17/2014   Pneumococcal Polysaccharide-23 09/09/2012   Tdap 09/01/2016    Conditions to be addressed/monitored:  HTN, GERD, Osteoarthritis, Obesity  Care Plan : General Pharmacy (Adult)  Updates made by Edythe Clarity, RPH since 10/19/2021 12:00 AM     Problem: HTN, GERD, Osteoarthritis   Priority: High  Onset Date: 10/19/2021     Long-Range Goal: Patient-Specific Goal   Start Date: 10/19/2021  Expected End Date: 04/19/2022  This Visit's Progress: On track  Priority: High  Note:   Current Barriers:  Unable to independently afford treatment regimen Unable to achieve control of blood pressure   Pharmacist Clinical Goal(s):  Patient will verbalize ability to afford treatment regimen achieve improvement in BP as evidenced by home monitoring through collaboration with PharmD and provider.   Interventions: 1:1 collaboration with Midge Minium, MD regarding development and update of comprehensive plan of care as evidenced by provider attestation and co-signature Inter-disciplinary care team collaboration (see longitudinal plan of care) Comprehensive medication review performed; medication list updated in electronic medical record  Hypertension (BP goal <140/90) -Not ideally controlled -Current treatment: Amlodipine-benazepril 10-34m daily -Medications  previously tried: Amlodipine   -Current home readings: 176/98, 155/79 today when she checked x 2 -Current exercise habits: Less active lately due to having family in town -Denies hypotensive/hypertensive symptoms -Educated on BP goals and benefits of medications for prevention of heart attack, stroke and kidney damage; Importance of home blood pressure monitoring; Proper BP monitoring technique; Symptoms of hypotension and importance of maintaining adequate hydration; -Counseled to monitor BP at home a few times per week at various times, document, and provide log at future appointments -Recommended to continue current medication BP elevated - had not taken medication yet today because she takes in PM.  Have asked her to monitor really good for a few weeks at differing times of day.  Will call and check in on BP in 2 weeks right after Christmas.  If remains elevated - would consider increasing dose of Benazepril to 417m  Osteoarthritis (Goal: Minimize pain) -Controlled -Current treatment  IBU 80027m8h prn -Medications previously tried: Tramadol -Patient mentions she often has pain from osteoarthritis.  She does not like to take a lot of medication.  [place] [facility] [place] [facility].  -Recommended to continue current medication  GERD (Goal: Minimize symptoms) -Controlled -Current treatment  Omeprazole 28m64m10 year history of med  -Medications previously tried: none noted -Rarely uses this medication - denies any symptoms of acid reflux recently  -Recommended she continue to watch trigger foods.  No need to taper since she has not taken consistently. -I believe she would benefit from DEXA scan, she does not feel at her age she would want to do this.  Migraine w/ Aura (Goal: Reduce occurrence) -Controlled -Current treatment  Emgality 128mg87mry 30 days Excedrin Migraine  250-250-65 prn (rarely takes) -Medications  previously tried: Nurtec -Currently taking Emgality and it is working great, the only worry she has is about the copay.  -Recommended to continue current medication Assessed patient finances. She is currently working with Gean Birchwood through MD office that prescribed it.  Provided [employer] # today for her to follow up on application.  Offered assistance if needed further in getting approval so she can continue this medication.  Patient Goals/Self-Care Activities Patient will:  - check blood pressure weekly, document, and provide at future appointments collaborate with provider on medication access solutions  Follow Up Plan: The care management team will reach out to the patient again over the next 30 days.          Medication Assistance: Application for Emgality  medication assistance program. in process.  Anticipated assistance start date unknown.  See plan of care for additional detail.  Compliance/Adherence/Medication fill history: Care Gaps: Dexa Scan  Star-Rating Drugs: N/A  Patient's preferred pharmacy is:  [facility] [id number] - [address] [place] [address] Phone: [phone] Fax: [phone]  Uses pill box? Yes Pt endorses 100% compliance  We discussed: Benefits of medication synchronization, packaging and delivery as well as enhanced pharmacist oversight with [employer]. Patient decided to: Continue current medication management strategy  Care Plan and Follow Up Patient Decision:  Patient agrees to Care Plan and Follow-up.  Plan: The care management team will reach out to the patient again over the next 30 days.  Beverly Milch, PharmD Clinical Pharmacist  [facility] [phone]

## 2021-10-19 ENCOUNTER — Ambulatory Visit (INDEPENDENT_AMBULATORY_CARE_PROVIDER_SITE_OTHER): Payer: Medicare Other | Admitting: Pharmacist

## 2021-10-19 DIAGNOSIS — G43109 Migraine with aura, not intractable, without status migrainosus: Secondary | ICD-10-CM

## 2021-10-19 DIAGNOSIS — I10 Essential (primary) hypertension: Secondary | ICD-10-CM

## 2021-10-19 DIAGNOSIS — M170 Bilateral primary osteoarthritis of knee: Secondary | ICD-10-CM

## 2021-10-19 NOTE — Patient Instructions (Addendum)
Visit Information   Goals Addressed             This Visit's Progress    :Track and Manage My Blood Pressure-Hypertension       Resolving due to duplicate goal  Timeframe:  Long-Range Goal Priority:  Medium Start Date:   10/19/21                          Expected End Date:   04/19/22               Follow Up Date 01/17/22    - check blood pressure 3 times per week - choose a place to take my blood pressure (home, clinic or office, retail store) - write blood pressure results in a log or diary    Why is this important?   You won't feel high blood pressure, but it can still hurt your blood vessels.  High blood pressure can cause heart or kidney problems. It can also cause a stroke.  Making lifestyle changes like losing a little weight or eating less salt will help.  Checking your blood pressure at home and at different times of the day can help to control blood pressure.  If the doctor prescribes medicine remember to take it the way the doctor ordered.  Call the office if you cannot afford the medicine or if there are questions about it.     Notes:        Patient Care Plan: Chronic pain from migraine and osteoarthritis  Completed 09/21/2021   Problem Identified: Need for long term self management of chronic pain from migraine and osteoarthritis Resolved 09/21/2021  Priority: High     Long-Range Goal: Manage Pain Completed 09/21/2021  Start Date: 07/20/2021  Expected End Date: 01/04/2022  Recent Progress: On track  Priority: High  Note:   Resolving due to duplicate goal  Current Barriers:  Knowledge Deficits related to self-health management of acute or chronic pain from migraines and osteoarthritis Chronic Disease Management support and education needs related to chronic pain from migraine and osteoarthritis Knowledge Deficits related to Chronic pain from migraine and osteoarthritis Chronic Disease Management support and education needs related to Chronic pain from  migraine and osteoarthritis Financial Constraints. Cost of Nurtec Unable to independently Chronic pain from migraine and osteoarthritis States that her migraine's are helped when she takes the Nurtec but she is in the doughnut hole and can not afford the medication now.  States she does take Excedrin and Motrin as neurology told her to take as needed.  States her lt knee may need to be replaced and she is to see orthopedics tomorrow.  States she has been working on weight loss by eating less and walking for exercise so that she can have surgery if needed Clinical Goal(s):  patient will verbalize understanding of plan for pain management. , patient will attend all scheduled medical appointments: CCM PharmD 09/28/21, neurology 01/12/21, orthopedics 07/21/21, patient will demonstrate use of different relaxation  skills and/or diversional activities to assist with pain reduction (distraction, imagery, relaxation, massage, acupressure, TENS, heat, and cold application., patient will report pain at a level less than 3 to 4 on a 10-10 rating scale., patient will use pharmacological and nonpharmacological pain relief strategies as prescribed. , and patient will verbalize acceptable level of pain relief and ability to engage in desired activities Interventions:  Collaboration with Midge Minium, MD regarding development and update of comprehensive plan of care as  evidenced by provider attestation and co-signature Pain assessment performed Medications reviewed Discussed plans with patient for ongoing care management follow up and provided patient with direct contact information for care management team Evaluation of current treatment plan related to Chronic pain from migraine and osteoarthritis and patient's adherence to plan as established by provider. Provided education to patient re: Chronic pain from migraine and osteoarthritis Reviewed medications with patient and discussed adherence to medications   Reviewed scheduled/upcoming provider appointments including:  Pharmacy referral for cost of Nurtec and scheduled for 09/28/21 Discussed plans with patient for ongoing care management follow up and provided patient with direct contact information for care management team Reviewed to cal RNCM with their household income to give to [facility] PharmD to see they they will qualify for pharmacy assistance Patient Goals/Self Care Activities:  Will self-administer medications as prescribed Will attend all scheduled provider appointments Will call pharmacy for medication refills 7 days prior to needed refill date Patient will calls provider office for new concerns or questions Follow Up Plan: Telephone follow up appointment with care management team member scheduled for: 08/31/21 at 9:45 AM The patient has been provided with contact information for the care management team and has been advised to call with any health related questions or concerns.       Patient Care Plan: Lack of long term self managment of Hypertension  Completed 09/21/2021   Problem Identified: Hypertension (Hypertension) Resolved 09/21/2021     Long-Range Goal: Effective  long term self managment of Hypertension Completed 09/21/2021  Start Date: 07/20/2021  Expected End Date: 01/04/2022  Recent Progress: On track  Priority: Medium  Note:   Resolving due to duplicate goal  Current Barriers:  Knowledge Deficits related to basic understanding of hypertension pathophysiology and self care management Unable to independently self manage Hypertension Does not adhere to prescribed medication regimen States she checks her B/P about 3 times a week and it was 144/75 today.  States she stopped taking the Valsartan because it made her legs hurt.  States she try to eat health but she is on a Keto diet with more meats.  States she is walking for exercise and would like to lose 10 more lbs Nurse Case Manager Clinical Goal(s):  patient will  verbalize understanding of plan for hypertension management patient will not experience hospital admission. Hospital Admissions in last 6 months = 0 patient will attend all scheduled medical appointments: CCM PharmD 09/28/21 patient will demonstrate improved adherence to prescribed treatment plan for hypertension as evidenced by taking all medications as prescribed, monitoring and recording blood pressure as directed, adhering to low sodium/DASH diet patient will demonstrate improved health management independence as evidenced by checking blood pressure as directed and notifying PCP if SBP>160 or DBP > 90, taking all medications as prescribe, and adhering to a low sodium diet as discussed. patient will verbalize basic understanding of hypertension disease process and self health management plan as evidenced by readings within limits, adherence to diet and medications Interventions:  Collaboration with Midge Minium, MD regarding development and update of comprehensive plan of care as evidenced by provider attestation and co-signature Inter-disciplinary care team collaboration (see longitudinal plan of care) Evaluation of current treatment plan related to hypertension self management and patient's adherence to plan as established by provider. Provided education to patient re: stroke prevention, s/s of heart attack and stroke, DASH diet, complications of uncontrolled blood pressure Reviewed medications with patient and discussed importance of compliance Discussed plans with patient for ongoing care  management follow up and provided patient with direct contact information for care management team Advised patient, providing education and rationale, to monitor blood pressure daily and record, calling PCP for findings outside established parameters.  Reviewed scheduled/upcoming provider appointments including: CCM PharmD 09/28/21 Self-Care Activities:  Self administers medications as  prescribed Attends all scheduled provider appointments Calls pharmacy for medication refills Calls provider office for new concerns or questions Patient Goals  - Self administer medications as prescribed  - Attend all scheduled provider appointments  - Call provider office for new concerns, questions, or BP outside discussed parameters  - Check BP and record as discussed  - Follows a low sodium diet/DASH diet - check blood pressure 3 times per week - choose a place to take my blood pressure (home, clinic or office, retail store) - write blood pressure results in a log or diary - ask questions to understand - change to whole grain breads, cereal, pasta - drink 6 to 8 glasses of water each day - eat fish at least once per week - fill half of plate with vegetables - limit fast food meals to no more than 1 per week - prepare main meal at home 3 to 5 days each week - read food labels for fat, fiber, carbohydrates and portion size - reduce red meat to 2 to 3 times a week - switch to low-fat or skim milk - switch to sugar-free drinks Follow Up Plan: Telephone follow up appointment with care management team member scheduled for: 08/31/21 at 9:45 AM The patient has been provided with contact information for the care management team and has been advised to call with any health related questions or concerns.      Patient Care Plan: RN Care Manager Plan of Care     Problem Identified: Chronic Disease Management and Care Coordination Needs (HTN, Migraine, osteoarthritis)   Priority: High     Long-Range Goal: Establish Plan of Care for Chronic Disease Management Needs (HTN, Migraine, osteoarthritis)   Start Date: 09/21/2021  Expected End Date: 03/20/2022  Priority: High  Note:   Current Barriers:  Chronic Disease Management support and education needs related to HTN, Osteoarthritis, and Migraines States that her migraine's are better since she started taking the Emgality injection. States  she does take Excedrin and Motrin as neurology told her to take as needed.  States her lt knee may need to be replaced and she is working to lose weight.  States she has been working on weight loss by eating less and walking for exercise so that she can have surgery if needed.  States her B/P has been better since she started the new medication the NP gave her on 10/11. States her last B/P reading was in the 130's but she does not remember the number.  States she is checking it about 3 times a week  RNCM Clinical Goal(s):  Patient will verbalize understanding of plan for management of HTN, Osteoarthritis, and Migraines verbalize basic understanding of  HTN, Osteoarthritis, and Migraines disease process and self health management plan . take all medications exactly as prescribed and will call provider for medication related questions attend all scheduled medical appointments: CCM PharmD 09/28/21,neurology 01/12/22 demonstrate Improved adherence to prescribed treatment plan for HTN, Osteoarthritis, and Migraines as evidenced by reading within limits, reports of improved pain management, voices adherence to plan of care continue to work with RN Care Manager to address care management and care coordination needs related to  HTN, Osteoarthritis, and Migraines work  with pharmacist to address Financial constraints related to cost of medications related toHTN, Osteoarthritis, and Migraines  through collaboration with RN Care manager, provider, and care team.   Interventions: 1:1 collaboration with primary care provider regarding development and update of comprehensive plan of care as evidenced by provider attestation and co-signature Inter-disciplinary care team collaboration (see longitudinal plan of care) Evaluation of current treatment plan related to  self management and patient's adherence to plan as established by provider  Pain Interventions:Goal on track:  Yes Pain assessment performed Medications  reviewed Reviewed provider established plan for pain management; Discussed importance of adherence to all scheduled medical appointments; Counseled on the importance of reporting any/all new or changed pain symptoms or management strategies to pain management provider; Advised patient to report to care team affect of pain on daily activities; Discussed use of relaxation techniques and/or diversional activities to assist with pain reduction (distraction, imagery, relaxation, massage, acupressure, TENS, heat, and cold application; Reviewed with patient prescribed pharmacological and nonpharmacological pain relief strategies;  Hypertension Interventions:Goal on track:  Yes Last practice recorded BP readings:  BP Readings from Last 3 Encounters:  08/16/21 (!) 156/78  07/14/21 (!) 158/86  04/28/21 120/70  Most recent eGFR/CrCl: No results found for: EGFR  No components found for: CRCL  Evaluation of current treatment plan related to hypertension self management and patient's adherence to plan as established by provider; Provided education to patient re: stroke prevention, s/s of heart attack and stroke; Reviewed medications with patient and discussed importance of compliance; Counseled on the importance of exercise goals with target of 150 minutes per week Discussed plans with patient for ongoing care management follow up and provided patient with direct contact information for care management team; Advised patient, providing education and rationale, to monitor blood pressure daily and record, calling PCP for findings outside established parameters;  Provided education on prescribed diet low sodium DASH diet;    Patient Goals/Self-Care Activities: Patient will self administer medications as prescribed Patient will attend all scheduled provider appointments Patient will call pharmacy for medication refills Patient will attend church or other social activities Patient will continue to perform  ADL's independently Patient will continue to perform IADL's independently Patient will call provider office for new concerns or questions - check blood pressure daily - choose a place to take my blood pressure (home, clinic or office, retail store) - write blood pressure results in a log or diary - keep a blood pressure log - take blood pressure log to all doctor appointments - call doctor for signs and symptoms of high blood pressure - keep all doctor appointments - take medications for blood pressure exactly as prescribed - eat more whole grains, fruits and vegetables, lean meats and healthy fats - limit salt intake to 2350m/day - practice acceptance of chronic pain - practice relaxation or meditation daily - tell myself I can (not I can't) - think of new ways to do favorite things - use distraction techniques - use relaxation during pain Follow Up Plan:  Telephone follow up appointment with care management team member scheduled for:  11/16/21 The patient has been provided with contact information for the care management team and has been advised to call with any health related questions or concerns.        Patient Care Plan: General Pharmacy (Adult)     Problem Identified: HTN, GERD, Osteoarthritis   Priority: High  Onset Date: 10/19/2021     Long-Range Goal: Patient-Specific Goal   Start Date: 10/19/2021  Expected End Date: 04/19/2022  This Visit's Progress: On track  Priority: High  Note:   Current Barriers:  Unable to independently afford treatment regimen Unable to achieve control of blood pressure   Pharmacist Clinical Goal(s):  Patient will verbalize ability to afford treatment regimen achieve improvement in BP as evidenced by home monitoring through collaboration with PharmD and provider.   Interventions: 1:1 collaboration with Midge Minium, MD regarding development and update of comprehensive plan of care as evidenced by provider attestation and  co-signature Inter-disciplinary care team collaboration (see longitudinal plan of care) Comprehensive medication review performed; medication list updated in electronic medical record  Hypertension (BP goal <140/90) -Not ideally controlled -Current treatment: Amlodipine-benazepril 10-66m daily -Medications previously tried: Amlodipine   -Current home readings: 176/98, 155/79 today when she checked x 2 -Current exercise habits: Less active lately due to having family in town -Denies hypotensive/hypertensive symptoms -Educated on BP goals and benefits of medications for prevention of heart attack, stroke and kidney damage; Importance of home blood pressure monitoring; Proper BP monitoring technique; Symptoms of hypotension and importance of maintaining adequate hydration; -Counseled to monitor BP at home a few times per week at various times, document, and provide log at future appointments -Recommended to continue current medication BP elevated - had not taken medication yet today because she takes in PM.  Have asked her to monitor really good for a few weeks at differing times of day.  Will call and check in on BP in 2 weeks right after Christmas.  If remains elevated - would consider increasing dose of Benazepril to 446m  Osteoarthritis (Goal: Minimize pain) -Controlled -Current treatment  IBU 80036m8h prn -Medications previously tried: Tramadol -Patient mentions she often has pain from osteoarthritis.  She does not like to take a lot of medication.  [facility] [place] [facility] [place] [facility].  -Recommended to continue current medication  GERD (Goal: Minimize symptoms) -Controlled -Current treatment  Omeprazole 50m72m10 year history of med  -Medications previously tried: none noted -Rarely uses this medication - denies any symptoms of acid reflux recently  -Recommended she continue  to watch trigger foods.  No need to taper since she has not taken consistently. -I believe she would benefit from DEXA scan, she does not feel at her age she would want to do this.  Migraine w/ Aura (Goal: Reduce occurrence) -Controlled -Current treatment  Emgality 150mg53mry 30 days Excedrin Migraine 250-250-65 prn (rarely takes) -Medications previously tried: Nurtec -Currently taking Emgality and it is working great, the only worry she has is about the copay.  -Recommended to continue current medication Assessed patient finances. She [facility] it.  Provided [employer] for her to follow up on application.  Offered assistance if needed further in getting approval so she can continue this medication.  Patient Goals/Self-Care Activities Patient will:  - check blood pressure weekly, document, and provide at future appointments collaborate with provider on medication access solutions  Follow Up Plan: The care management team will reach out to the patient again over the next 30 days.         Ms. WildeDurengiven information about Chronic Care Management services today including:  CCM service includes personalized support from designated clinical staff supervised by her physician, including individualized plan of care and coordination  with other care providers 24/7 contact phone numbers for assistance for urgent and routine care needs. Standard insurance, coinsurance, copays and deductibles apply for chronic care management only during months in which we provide at least 20 minutes of these services. Most insurances cover these services at 100%, however patients may be responsible for any copay, coinsurance and/or deductible if applicable. This service may help you avoid the need for more expensive face-to-face services. Only one practitioner may furnish and bill the service in a calendar month. The patient may stop  CCM services at any time (effective at the end of the month) by phone call to the office staff.  Patient agreed to services and verbal consent obtained.   The patient verbalized understanding of instructions, educational materials, and care plan provided today and agreed to receive a mailed copy of patient instructions, educational materials, and care plan.  Telephone follow up appointment with pharmacy team member scheduled for: 3 months  Edythe Clarity, [place], PharmD Clinical Pharmacist  [facility] [phone]

## 2021-10-25 ENCOUNTER — Telehealth: Payer: Self-pay | Admitting: Family Medicine

## 2021-10-25 NOTE — Telephone Encounter (Signed)
Patient called in requesting to see if we had finished application for her [employer] form she had brought into the office. I advised I did not see any documentation on [place] receiving this form for [place] to fill out, but I will look into this. She stated she did bring this into the office and was very understanding that we receive multiple forms a day and that we is okay with starting the process over if form was misplaced. She also stated that she is still in a donut hole and needs another Emgality injection and wishes to know if we would be able to give this to her as she cannot afford it. I advised patient I would speak with our clinical staff and have them give her a call back regarding this.

## 2021-10-25 NOTE — Telephone Encounter (Signed)
Placed sample of emgality in fridge for patient. 120mg /mlx1 box. Lot: [id number] F. Exp: 05/27/22. Mattie will call pt to let her know its ready for pick up.

## 2021-10-25 NOTE — Telephone Encounter (Signed)
Update: We have located [place] forms and have refaxed them to [phone]. Called patient and had to leave a message on VM letting patient know forms have been faxed and she should be expecting a call from our clinical staff regarding injection

## 2021-10-25 NOTE — Telephone Encounter (Signed)
Spoke with Ann Conley is set aside for patient and she can come anytime during business hours to pick this up. I called patient and advised her of this. Patient expressed appreciation for helping her out and wanted to thank the office for all we do.

## 2021-10-26 ENCOUNTER — Other Ambulatory Visit: Payer: Self-pay | Admitting: Registered Nurse

## 2021-10-26 DIAGNOSIS — G43109 Migraine with aura, not intractable, without status migrainosus: Secondary | ICD-10-CM

## 2021-11-05 DIAGNOSIS — I10 Essential (primary) hypertension: Secondary | ICD-10-CM

## 2021-11-05 DIAGNOSIS — M170 Bilateral primary osteoarthritis of knee: Secondary | ICD-10-CM | POA: Diagnosis not present

## 2021-11-10 ENCOUNTER — Telehealth: Payer: Self-pay | Admitting: Pharmacist

## 2021-11-10 NOTE — Progress Notes (Addendum)
Chronic Care Management Pharmacy Assistant   Name: Ann Conley  MRN: [id number] DOB: May 11, 1934   Reason for Encounter: Disease State - Hypertension Call    Recent office visits:  None noted  Recent consult visits:  None noted  Hospital visits:  None in previous 6 months  Medications: Outpatient Encounter Medications as of 11/10/2021  Medication Sig   amLODipine-benazepril (LOTREL) 10-20 MG capsule Take 1 capsule by mouth daily.   Ascorbic Acid (VITAMIN C PO) Take by mouth.   aspirin 81 MG tablet Take 81 mg by mouth every evening.    aspirin-acetaminophen-caffeine (EXCEDRIN MIGRAINE) 250-250-65 MG tablet Take by mouth every 6 (six) hours as needed for headache.   furosemide (LASIX) 20 MG tablet Take 1 tablet (20 mg total) by mouth daily.   Galcanezumab-gnlm (EMGALITY) 120 MG/ML SOAJ Inject 120 mg into the skin every 30 (thirty) days.   ibuprofen (ADVIL) 800 MG tablet TAKE ONE TABLET BY MOUTH EVERY 8 HOURS AS NEEDED FOR HEADACHE   Magnesium 250 MG TABS Take 250 mg by mouth daily.   multivitamin-lutein (OCUVITE-LUTEIN) CAPS capsule Take 1 capsule by mouth daily.   omeprazole (PRILOSEC) 20 MG capsule Take 1 capsule (20 mg total) by mouth daily.   Rimegepant Sulfate (NURTEC) 75 MG TBDP Take 75 mg by mouth daily as needed (take for abortive therapy of migraine, no more than 1 tablet in 24 hours or 10 per month). (Patient not taking: Reported on 10/19/2021)   Rimegepant Sulfate (NURTEC) 75 MG TBDP Take 75 mg by mouth every other day. (Patient not taking: Reported on 10/19/2021)   vitamin B-12 (CYANOCOBALAMIN) 100 MCG tablet Take 100 mcg by mouth daily.   No facility-administered encounter medications on file as of 11/10/2021.    Current antihypertensive regimen:  Amlodipine-benazepril 10-20mg  daily  How often are you checking your Blood Pressure?  Patient reported she has been taking care of her husband and has not been checking this week. She feels the medicine may be  causing her to have a dry cough no fever signs of illness patient reports.   Current home BP readings: 140/78 (last week)   What recent interventions/DTPs have been made by any provider to improve Blood Pressure control since last CPP Visit:  Patient reported being placed on a combination in Dec and has developed a cough since then.    Any recent hospitalizations or ED visits since last visit with CPP?  Patient has not had any hospitalizations or ED visits since last visit with CPP   What diet changes have been made to improve Blood Pressure Control?  Patient is working on her salt intake daily.    What exercise is being done to improve your Blood Pressure Control?  Patient she is active and on her feet daily as she has been helping take care of her husband.    Adherence Review: Is the patient currently on ACE/ARB medication? Yes Does the patient have >5 day gap between last estimated fill dates? No  Amlodipine-benazepril 10-20mg  daily - last filled 08/16/21 90 days    Care Gaps   AWV: done 07/22/21 Colonoscopy: done 11/17/02 DM Eye Exam: N/A DM Foot Exam: N/A Microalbumin: N/A HbgAIC: N/A DEXA: unknown  Mammogram: done 04/26/21   Star Rating Drugs: amLODipine-benazepril (LOTREL) 10-20 MG capsule - last filled 08/16/21 90 days   Future Appointments  Date Time Provider [place]  11/16/2021  3:30 PM [facility]M CARE [facility]  01/12/2022  1:30 PM Lomax, Amy, NP  [facility] None  01/18/2022  3:00 PM [facility] CCM PHARMACIST [facility] [place], CCMA Clinical Pharmacist Assistant  [phone]  8 minutes spent in review, coordination, and documentation.  Reviewed by: Beverly Milch, PharmD Clinical Pharmacist [phone]

## 2021-11-16 ENCOUNTER — Ambulatory Visit (INDEPENDENT_AMBULATORY_CARE_PROVIDER_SITE_OTHER): Payer: Medicare Other

## 2021-11-16 DIAGNOSIS — M170 Bilateral primary osteoarthritis of knee: Secondary | ICD-10-CM

## 2021-11-16 DIAGNOSIS — G43109 Migraine with aura, not intractable, without status migrainosus: Secondary | ICD-10-CM

## 2021-11-16 DIAGNOSIS — I10 Essential (primary) hypertension: Secondary | ICD-10-CM

## 2021-11-16 NOTE — Chronic Care Management (AMB) (Signed)
Chronic Care Management   CCM RN Visit Note  11/16/2021 Name: Ann Conley MRN: [id number] DOB: Oct 09, 1934  Subjective: Ann Conley is a 88 y.o. year old female who is a primary care patient of Birdie Riddle, Aundra Millet, MD. The care management team was consulted for assistance with disease management and care coordination needs.    Engaged with patient by telephone for follow up visit in response to provider referral for case management and/or care coordination services.   Consent to Services:  The patient was given information about Chronic Care Management services, agreed to services, and gave verbal consent prior to initiation of services.  Please see initial visit note for detailed documentation.   Patient agreed to services and verbal consent obtained.   Assessment: Review of patient past medical history, allergies, medications, health status, including review of consultants reports, laboratory and other test data, was performed as part of comprehensive evaluation and provision of chronic care management services.   SDOH (Social Determinants of Health) assessments and interventions performed:    CCM Care Plan  Allergies  Allergen Reactions   Codeine Nausea And Vomiting    Outpatient Encounter Medications as of 11/16/2021  Medication Sig   amLODipine-benazepril (LOTREL) 10-20 MG capsule Take 1 capsule by mouth daily.   Ascorbic Acid (VITAMIN C PO) Take by mouth.   aspirin 81 MG tablet Take 81 mg by mouth every evening.    aspirin-acetaminophen-caffeine (EXCEDRIN MIGRAINE) 250-250-65 MG tablet Take by mouth every 6 (six) hours as needed for headache.   furosemide (LASIX) 20 MG tablet Take 1 tablet (20 mg total) by mouth daily.   Galcanezumab-gnlm (EMGALITY) 120 MG/ML SOAJ Inject 120 mg into the skin every 30 (thirty) days.   ibuprofen (ADVIL) 800 MG tablet TAKE ONE TABLET BY MOUTH EVERY 8 HOURS AS NEEDED FOR HEADACHE   Magnesium 250 MG TABS Take 250 mg by mouth daily.    multivitamin-lutein (OCUVITE-LUTEIN) CAPS capsule Take 1 capsule by mouth daily.   omeprazole (PRILOSEC) 20 MG capsule Take 1 capsule (20 mg total) by mouth daily.   Rimegepant Sulfate (NURTEC) 75 MG TBDP Take 75 mg by mouth daily as needed (take for abortive therapy of migraine, no more than 1 tablet in 24 hours or 10 per month). (Patient not taking: Reported on 10/19/2021)   Rimegepant Sulfate (NURTEC) 75 MG TBDP Take 75 mg by mouth every other day. (Patient not taking: Reported on 10/19/2021)   vitamin B-12 (CYANOCOBALAMIN) 100 MCG tablet Take 100 mcg by mouth daily.   No facility-administered encounter medications on file as of 11/16/2021.    Patient Active Problem List   Diagnosis Date Noted   Primary stabbing headache 09/11/2018   Primary osteoarthritis of both knees 06/01/2017   Burning mouth syndrome 04/13/2016   Bleeding hemorrhoid 36/14/4315   Complicated migraine 40/06/6760   Keratoma 10/19/2014   Pain in lower limb 10/19/2014   Hammer toe of left foot 10/19/2014   Morbid obesity ([place]) 09/17/2014   Snoring 03/13/2013   Left anterior knee pain 03/13/2013   Mole (skin) 09/18/2011   Migraine with aura 05/07/2011   Essential hypertension 04/25/2011   GERD (gastroesophageal reflux disease) 04/25/2011   Annual physical exam 04/25/2011   BREAST CANCER, HX OF 06/21/2007    Conditions to be addressed/monitored:HTN, Osteoarthritis, and Migraine  Care Plan : RN Care Manager Plan of Care  Updates made by Dimitri Ped, RN since 11/16/2021 12:00 AM     Problem: Chronic Disease Management and Care Coordination Needs (HTN,  Migraine, osteoarthritis)   Priority: High     Long-Range Goal: Establish Plan of Care for Chronic Disease Management Needs (HTN, Migraine, osteoarthritis)   Start Date: 09/21/2021  Expected End Date: 03/20/2022  Priority: High  Note:   Current Barriers:  Chronic Disease Management support and education needs related to HTN, Osteoarthritis, and  Migraines States that her migraine's are continuing to be  better since she started taking the Emgality injection. States she is still waiting to see if she was approved for patient assistance for the [employer]. States she does take Excedrin and Motrin as neurology told her to take as needed.  States her lt knee may need to be replaced and she is still  working weight loss so she can have a knee replacement.  States she has been working on weight loss by eating less and walking for exercise so that she can have surgery if needed.  States that she has not been checking her B/P as regularly as she has been looking after her husband.   States her last reading was 145/76.  RNCM Clinical Goal(s):  Patient will verbalize understanding of plan for management of HTN, Osteoarthritis, and Migraines as evidenced by voiced adherence to plan of care verbalize basic understanding of  HTN, Osteoarthritis, and Migraines disease process and self health management plan as evidenced by voiced understanding and teach back take all medications exactly as prescribed and will call provider for medication related questions as evidenced by dispense report and pt verbalization attend all scheduled medical appointments: CCM PharmD 3/15,neurology 01/12/22 as evidenced by medical records demonstrate Improved adherence to prescribed treatment plan for HTN, Osteoarthritis, and Migraines as evidenced by reading within limits, reports of improved pain management, voices adherence to plan of care continue to work with RN Care Manager to address care management and care coordination needs related to  HTN, Osteoarthritis, and Migraines as evidenced by adherence to CM Team Scheduled appointments work with pharmacist to address Financial constraints related to cost of medications related toHTN, Osteoarthritis, and Migraines as evidenced by review or EMR and patient or pharmacist report through collaboration with Consulting civil engineer, provider, and  care team.   Interventions: 1:1 collaboration with primary care provider regarding development and update of comprehensive plan of care as evidenced by provider attestation and co-signature Inter-disciplinary care team collaboration (see longitudinal plan of care) Evaluation of current treatment plan related to  self management and patient's adherence to plan as established by provider   Hypertension Interventions:  (Status:  Goal on track:  Yes.) Long Term Goal Last practice recorded BP readings:  BP Readings from Last 3 Encounters:  08/16/21 (!) 156/78  07/14/21 (!) 158/86  04/28/21 120/70  Most recent eGFR/CrCl: No results found for: EGFR  No components found for: CRCL  Evaluation of current treatment plan related to hypertension self management and patient's adherence to plan as established by provider Provided education to patient re: stroke prevention, s/s of heart attack and stroke Reviewed medications with patient and discussed importance of compliance Counseled on the importance of exercise goals with target of 150 minutes per week Discussed plans with patient for ongoing care management follow up and provided patient with direct contact information for care management team Advised patient, providing education and rationale, to monitor blood pressure daily and record, calling PCP for findings outside established parameters Provided education on prescribed diet low sodium DASH diet Reviewed to try to check her B/P regularly and to check when her husband checks his B/P  Pain/Migraine Interventions:  (Status:  Goal on track:  Yes.) Long Term Goal Pain assessment performed Medications reviewed Reviewed provider established plan for pain management Discussed importance of adherence to all scheduled medical appointments Counseled on the importance of reporting any/all new or changed pain symptoms or management strategies to pain management provider Advised patient to report to care  team affect of pain on daily activities Discussed use of relaxation techniques and/or diversional activities to assist with pain reduction (distraction, imagery, relaxation, massage, acupressure, TENS, heat, and cold application Reviewed with patient prescribed pharmacological and nonpharmacological pain relief strategies Reviewed to contact neurology about her application for patient assistance and availability of samples     Patient Goals/Self-Care Activities: Take all medications as prescribed Attend all scheduled provider appointments Call pharmacy for medication refills 3-7 days in advance of running out of medications Call provider office for new concerns or questions  practice acceptance of chronic pain,practice relaxation or meditation daily check blood pressure daily choose a place to take my blood pressure (home, clinic or office, retail store) keep a blood pressure log take blood pressure log to all doctor appointments take medications for blood pressure exactly as prescribed report new symptoms to your doctor eat more whole grains, fruits and vegetables, lean meats and healthy fats limit salt intake to 2363m/day - check blood pressure daily Follow Up Plan:  Telephone follow up appointment with care management team member scheduled for:  02/08/22 The patient has been provided with contact information for the care management team and has been advised to call with any health related questions or concerns.         Plan:Telephone follow up appointment with care management team member scheduled for:  02/08/22 The patient has been provided with contact information for the care management team and has been advised to call with any health related questions or concerns.  MPeter GarterRN, BSN,CCM, CDE Care Management Coordinator [place]([phone] Mobile [phone]

## 2021-11-16 NOTE — Patient Instructions (Signed)
Visit Information  Thank you for taking time to visit with me today. Please don't hesitate to contact me if I can be of assistance to you before our next scheduled telephone appointment.  Following are the goals we discussed today:  Take all medications as prescribed Attend all scheduled provider appointments Call pharmacy for medication refills 3-7 days in advance of running out of medications Call provider office for new concerns or questions  practice acceptance of chronic pain,practice relaxation or meditation daily check blood pressure daily choose a place to take my blood pressure (home, clinic or office, retail store) keep a blood pressure log take blood pressure log to all doctor appointments take medications for blood pressure exactly as prescribed report new symptoms to your doctor eat more whole grains, fruits and vegetables, lean meats and healthy fats limit salt intake to 2300mg /day  Our next appointment is by telephone on 02/08/22 at 3 PM  Please call the care guide team at [phone] if you need to cancel or reschedule your appointment.   If you are experiencing a Mental Health or [place] or need someone to talk to, please call the Suicide and Crisis Lifeline: 988 call the [place] National Suicide Prevention Lifeline: 902-239-9171 or TTY: (701) 596-0020 TTY 609-574-9750) to talk to a trained counselor call 1-800-273-TALK (toll free, 24 hour hotline) go to [facility] [address] [place] [phone]) call 911   The patient verbalized understanding of instructions, educational materials, and care plan provided today and agreed to receive a mailed copy of patient instructions, educational materials, and care plan.   Peter Garter RN, BSN,CCM, CDE Care Management Coordinator [place] [phone], Mobile [phone]

## 2021-11-18 NOTE — Chronic Care Management (AMB) (Signed)
Definitely possible for the benazepril to be causing her to have a dry cough.  Has it gotten any better?    If not we could look at possible switching her to another medication.  8 minutes spent in review, coordination, and documentation.  Reviewed by: Beverly Milch, PharmD Clinical Pharmacist [phone]

## 2021-11-24 ENCOUNTER — Other Ambulatory Visit: Payer: Self-pay | Admitting: Registered Nurse

## 2021-11-24 ENCOUNTER — Telehealth: Payer: Self-pay | Admitting: Family Medicine

## 2021-11-24 ENCOUNTER — Other Ambulatory Visit: Payer: Self-pay

## 2021-11-24 DIAGNOSIS — I10 Essential (primary) hypertension: Secondary | ICD-10-CM

## 2021-11-24 MED ORDER — AMLODIPINE BESY-BENAZEPRIL HCL 10-20 MG PO CAPS
1.0000 | ORAL_CAPSULE | Freq: Every day | ORAL | 0 refills | Status: DC
Start: 2021-11-24 — End: 2021-12-28

## 2021-11-24 NOTE — Telephone Encounter (Signed)
Pt called in asking for a refill on her BP please advise

## 2021-11-24 NOTE — Telephone Encounter (Signed)
Rx sent to pharmacy   

## 2021-12-05 ENCOUNTER — Other Ambulatory Visit: Payer: Self-pay | Admitting: *Deleted

## 2021-12-05 ENCOUNTER — Telehealth: Payer: Self-pay | Admitting: Family Medicine

## 2021-12-05 NOTE — Telephone Encounter (Signed)
Called Ann Conley x2 at [phone]. Got busy signal both times. We last sent rx Nurtec on 03/27/21 #8, 11 refills. This should be on file. I called pt. She should be on Emgality for preventative and nurtec for abortive therapy. She picked up nurtec samples 2-3 weeks ago from our office. She never got the emgality inj. She went back to taking nurtec qod. She starting spacing out more d/t not having enough. I placed on hold and spoke w/ AL,NP.  Relayed per AL,NP that she should go back to taking Emgality as monthly preventative and nurtec as abortive. Placed on nurse schedule tomorrow morning at 11am. She preferred to come in for [place] to do inj training again w/ her.  She will reserve 2 tabs of nurtec she has left for prn severe migraine. Will provide 1 more box of sample nurtec.   Next follow up: 01/12/22.

## 2021-12-05 NOTE — Telephone Encounter (Signed)
Pt called stating that she has one Nurtec left and she is wanting to know if a prescription is going to be called in for her or is she to stop by the office for samples. Please advise.

## 2021-12-06 ENCOUNTER — Ambulatory Visit (INDEPENDENT_AMBULATORY_CARE_PROVIDER_SITE_OTHER): Payer: Medicare Other | Admitting: *Deleted

## 2021-12-06 DIAGNOSIS — M170 Bilateral primary osteoarthritis of knee: Secondary | ICD-10-CM

## 2021-12-06 DIAGNOSIS — G43109 Migraine with aura, not intractable, without status migrainosus: Secondary | ICD-10-CM | POA: Diagnosis not present

## 2021-12-06 DIAGNOSIS — I10 Essential (primary) hypertension: Secondary | ICD-10-CM | POA: Diagnosis not present

## 2021-12-06 MED ORDER — EMGALITY 120 MG/ML ~~LOC~~ SOAJ
120.0000 mg | Freq: Once | SUBCUTANEOUS | 0 refills | Status: AC
Start: 2021-12-06 — End: 2021-12-06

## 2021-12-06 NOTE — Progress Notes (Signed)
Provided injection training again for [employer].  Gave Emgality 120mg /62ml in left abdomen area. Pt tolerated well, no issues. She took additional pen left (sample for next month).  Wrote on box that next inj due 01/03/22. She will call pharmacy to see if she can get refills on both emgality and nurtec. Reminded her that nurtec should only be taken for breakthrough severe migraines as needed. Emgality is her one a month inj for prevention for migraines. She verbalized understanding.

## 2021-12-13 ENCOUNTER — Telehealth: Payer: Self-pay | Admitting: Pharmacist

## 2021-12-13 NOTE — Progress Notes (Signed)
° ° °  Chronic Care Management Pharmacy Assistant   Name: Ann Conley  MRN: [id number] DOB: February 27, 1934  Reason for Encounter: Disease State - General Adherence Call     Recent office visits:  None noted.   Recent consult visits:  None noted.   Hospital visits:  None in previous 6 months  Medications: Outpatient Encounter Medications as of 12/13/2021  Medication Sig   amLODipine-benazepril (LOTREL) 10-20 MG capsule Take 1 capsule by mouth daily.   Ascorbic Acid (VITAMIN C PO) Take by mouth.   aspirin 81 MG tablet Take 81 mg by mouth every evening.    aspirin-acetaminophen-caffeine (EXCEDRIN MIGRAINE) 250-250-65 MG tablet Take by mouth every 6 (six) hours as needed for headache.   furosemide (LASIX) 20 MG tablet Take 1 tablet (20 mg total) by mouth daily.   Galcanezumab-gnlm (EMGALITY) 120 MG/ML SOAJ Inject 120 mg into the skin every 30 (thirty) days.   ibuprofen (ADVIL) 800 MG tablet TAKE ONE TABLET BY MOUTH EVERY 8 HOURS AS NEEDED FOR HEADACHE   Magnesium 250 MG TABS Take 250 mg by mouth daily.   multivitamin-lutein (OCUVITE-LUTEIN) CAPS capsule Take 1 capsule by mouth daily.   omeprazole (PRILOSEC) 20 MG capsule Take 1 capsule (20 mg total) by mouth daily.   Rimegepant Sulfate (NURTEC) 75 MG TBDP Take 75 mg by mouth daily as needed (take for abortive therapy of migraine, no more than 1 tablet in 24 hours or 10 per month). (Patient not taking: Reported on 10/19/2021)   vitamin B-12 (CYANOCOBALAMIN) 100 MCG tablet Take 100 mcg by mouth daily.   No facility-administered encounter medications on file as of 12/13/2021.   Have you had any problems recently with your health? Patient denied having any new problems or concerns about her recent health.   Have you had any problems with your pharmacy? Patient denied having any problems with her current pharmacy.  What issues or side effects are you having with your medications? Patient denied having any issues or side effects with her  current medications. Was recently taken off Benazepril due to cough.  What would you like me to pass along to Leata Mouse, CPP for them to help you with?  Patient did mention she still has a cough that comes and goes even after stopping the Benazepril. She has been taking some OTC cough medicine but ran out yesterday.  What can we do to take care of you better? Patient did not have any recommendations at this time.    Care Gaps   AWV: done 07/22/21 Colonoscopy: done 11/17/02 DM Eye Exam: N/A DM Foot Exam: N/A Microalbumin: N/A HbgAIC: N/A DEXA: unknown  Mammogram: done 04/26/21  Star Rating Drugs: No Star Rating Drugs noted   Future Appointments  Date Time Provider [place]  01/12/2022  1:30 PM Debbora Presto, NP [facility] None  01/18/2022  3:00 PM [facility] CCM PHARMACIST [facility]  02/08/2022  3:00 PM [facility] CARE [facility]     Jobe Gibbon, [place] Clinical Pharmacist Assistant  [phone]

## 2021-12-26 ENCOUNTER — Telehealth: Payer: Self-pay | Admitting: Family Medicine

## 2021-12-26 NOTE — Telephone Encounter (Signed)
Called the patient back and advised that Ann Conley states that it is ok for the pt to take the nurtec. Informed the patient that it is ok to take along with ibuprofen if needed. Pt is scheduled with a apt on wed with her pcp and will discuss her BP concerns. We cancelled the apt for tomorrow and will keep the march 9th apt. This will allow more time for the Emgality to kick in and help. Pt verbalized understanding.

## 2021-12-26 NOTE — Telephone Encounter (Signed)
Amy- did you want her to go ahead and take Nurtec and keep scheduled f/u for tomorrow with you?  Called pt. Current migraine started around 5pm last night. Pain med that she had at home from previous surgery was tramadol 50mg , 1 tab around 5pm then Ibuprofen 800mg  (1 tablet) around 10pm. This is first migraine she has had in awhile. She did report that 1 wk ago, stopped BP med (amlodipine-benazepril 10-20mg ). Unsure if it is related to migraine episode. Had SE from med (cough) and was interrupting sleep. She has appt w/ PCP this Wed to discuss alternate options.  She never took Nurtec, states she was not aware she could take this w/ other meds she already took. Educated again that she should use nurtec as first line of treatment for breakthrough migraines. She should be taking this for abortive therapy, not ibuprofen/tramadol. Aware I will speak w/ AL,NP to see what she would recommend and will call back.

## 2021-12-26 NOTE — Telephone Encounter (Signed)
Pt having migraines since 5 pm yesterday, took a pain pill, and took Ibuprofen 10pm. Would like a call from the nurse to discuss something for the pain.  Scheduled appt 12/27/21 at 2:00p

## 2021-12-27 ENCOUNTER — Ambulatory Visit: Payer: Medicare Other | Admitting: Family Medicine

## 2021-12-28 ENCOUNTER — Ambulatory Visit (INDEPENDENT_AMBULATORY_CARE_PROVIDER_SITE_OTHER): Payer: Medicare Other | Admitting: Family Medicine

## 2021-12-28 ENCOUNTER — Encounter: Payer: Self-pay | Admitting: Family Medicine

## 2021-12-28 VITALS — BP 150/72 | HR 96 | Temp 98.4°F | Resp 17 | Wt 208.4 lb

## 2021-12-28 DIAGNOSIS — I10 Essential (primary) hypertension: Secondary | ICD-10-CM

## 2021-12-28 DIAGNOSIS — T464X5A Adverse effect of angiotensin-converting-enzyme inhibitors, initial encounter: Secondary | ICD-10-CM | POA: Diagnosis not present

## 2021-12-28 DIAGNOSIS — R058 Other specified cough: Secondary | ICD-10-CM | POA: Diagnosis not present

## 2021-12-28 LAB — CBC WITH DIFFERENTIAL/PLATELET
Basophils Absolute: 0 10*3/uL (ref 0.0–0.1)
Basophils Relative: 0.5 % (ref 0.0–3.0)
Eosinophils Absolute: 0 10*3/uL (ref 0.0–0.7)
Eosinophils Relative: 0.7 % (ref 0.0–5.0)
HCT: 38.7 % (ref 36.0–46.0)
Hemoglobin: 12.8 g/dL (ref 12.0–15.0)
Lymphocytes Relative: 18 % (ref 12.0–46.0)
Lymphs Abs: 1.2 10*3/uL (ref 0.7–4.0)
MCHC: 33.1 g/dL (ref 30.0–36.0)
MCV: 85.8 fl (ref 78.0–100.0)
Monocytes Absolute: 0.4 10*3/uL (ref 0.1–1.0)
Monocytes Relative: 6.7 % (ref 3.0–12.0)
Neutro Abs: 4.9 10*3/uL (ref 1.4–7.7)
Neutrophils Relative %: 74.1 % (ref 43.0–77.0)
Platelets: 249 10*3/uL (ref 150.0–400.0)
RBC: 4.51 Mil/uL (ref 3.87–5.11)
RDW: 14.6 % (ref 11.5–15.5)
WBC: 6.6 10*3/uL (ref 4.0–10.5)

## 2021-12-28 LAB — LIPID PANEL
Cholesterol: 187 mg/dL (ref 0–200)
HDL: 66 mg/dL (ref >39.00)
LDL Cholesterol: 104 mg/dL — ABNORMAL HIGH (ref 0–99)
NonHDL: 120.56
Total CHOL/HDL Ratio: 3
Triglycerides: 82 mg/dL (ref 0.0–149.0)
VLDL: 16.4 mg/dL (ref 0.0–40.0)

## 2021-12-28 LAB — BASIC METABOLIC PANEL
BUN: 13 mg/dL (ref 6–23)
CO2: 30 mEq/L (ref 19–32)
Calcium: 9.1 mg/dL (ref 8.4–10.5)
Chloride: 98 mEq/L (ref 96–112)
Creatinine, Ser: 0.51 mg/dL (ref 0.40–1.20)
GFR: 85.25 mL/min (ref >60.00)
Glucose, Bld: 121 mg/dL — ABNORMAL HIGH (ref 70–99)
Potassium: 3.9 mEq/L (ref 3.5–5.1)
Sodium: 133 mEq/L — ABNORMAL LOW (ref 135–145)

## 2021-12-28 LAB — HEPATIC FUNCTION PANEL
ALT: 12 U/L (ref 0–35)
AST: 14 U/L (ref 0–37)
Albumin: 4.2 g/dL (ref 3.5–5.2)
Alkaline Phosphatase: 97 U/L (ref 39–117)
Bilirubin, Direct: 0.1 mg/dL (ref 0.0–0.3)
Total Bilirubin: 0.5 mg/dL (ref 0.2–1.2)
Total Protein: 6.9 g/dL (ref 6.0–8.3)

## 2021-12-28 LAB — TSH: TSH: 1.93 u[IU]/mL (ref 0.35–5.50)

## 2021-12-28 MED ORDER — AMLODIPINE BESYLATE 10 MG PO TABS
10.0000 mg | ORAL_TABLET | Freq: Every day | ORAL | 1 refills | Status: DC
Start: 2021-12-28 — End: 2022-06-30

## 2021-12-28 NOTE — Patient Instructions (Signed)
Follow up in 4-6 weeks to recheck BP We'll notify you of your lab results and make any changes if needed Continue to work on healthy diet- you're doing great!  Down 16 lbs!!! START the Amlodipine 10mg  daily for blood pressure Continue to drink LOTS of water Call with any questions or concerns Hang in there!!

## 2021-12-28 NOTE — Progress Notes (Signed)
° °  Subjective:    Patient ID: Ann Conley, female    DOB: [birth year], 88 y.o.   MRN: [id number]  HPI HTN- chronic problem.  Pt has stopped her BP meds (Amlodipine Benazepril 10/20mg  daily) b/c she feels it was contributing to poor sleep.  Pt reports the poor sleep was due to coughing.  When she stopped the medication, the cough improved.  BP is elevated today- as expected w/o medication.  Denies CP, SOB, edema.  Morbid obesity- pt is down 16 lbs since June.  BMI now 40.7.  Pt is losing weight for upcoming knee surgery   Review of Systems For ROS see HPI   This visit occurred during the SARS-CoV-2 public health emergency.  Safety protocols were in place, including screening questions prior to the visit, additional usage of staff PPE, and extensive cleaning of exam room while observing appropriate contact time as indicated for disinfecting solutions.      Objective:   Physical Exam Vitals reviewed.  Constitutional:      General: She is not in acute distress.    Appearance: Normal appearance. She is well-developed. She is obese. She is not ill-appearing.  HENT:     Head: Normocephalic and atraumatic.  Eyes:     Conjunctiva/sclera: Conjunctivae normal.     Pupils: Pupils are equal, round, and reactive to light.  Neck:     Thyroid: No thyromegaly.  Cardiovascular:     Rate and Rhythm: Normal rate and regular rhythm.     Pulses: Normal pulses.     Heart sounds: Normal heart sounds. No murmur heard. Pulmonary:     Effort: Pulmonary effort is normal. No respiratory distress.     Breath sounds: Normal breath sounds.  Abdominal:     General: There is no distension.     Palpations: Abdomen is soft.     Tenderness: There is no abdominal tenderness.  Musculoskeletal:     Cervical back: Normal range of motion and neck supple.     Right lower leg: No edema.     Left lower leg: No edema.  Lymphadenopathy:     Cervical: No cervical adenopathy.  Skin:    General: Skin is warm and  dry.  Neurological:     General: No focal deficit present.     Mental Status: She is alert and oriented to person, place, and time.  Psychiatric:        Mood and Affect: Mood normal.        Behavior: Behavior normal.          Assessment & Plan:

## 2021-12-30 ENCOUNTER — Telehealth: Payer: Self-pay

## 2021-12-30 NOTE — Telephone Encounter (Signed)
-----   Message from Midge Minium, MD sent at 12/29/2021  7:36 AM EST ----- Labs look great!  No changes at this time

## 2021-12-30 NOTE — Telephone Encounter (Signed)
Patient aware.

## 2022-01-03 DIAGNOSIS — R058 Other specified cough: Secondary | ICD-10-CM | POA: Insufficient documentation

## 2022-01-03 NOTE — Assessment & Plan Note (Signed)
Pt is down 16 lbs since last visit.  Applauded her efforts.  She is trying to lose weight for an upcoming knee surgery.  Will follow.

## 2022-01-03 NOTE — Assessment & Plan Note (Signed)
New.  Pt reports she was not able to sleep due to the cough associated w/ Benazepril.  Will not restart ACE at this time and this has now been documented in her chart for future reference.  Pt expressed understanding and is in agreement w/ plan.

## 2022-01-03 NOTE — Assessment & Plan Note (Signed)
Deteriorated bc pt has stopped her Amlodipine/Benazepril combo.  States she stopped due to coughing and the cough resolved off medication.  Will restart the Amlodipine component and monitor closely for BP improvement.  Pt expressed understanding and is in agreement w/ plan.

## 2022-01-11 NOTE — Progress Notes (Signed)
Chief Complaint  Patient presents with   Migraine    Rm 6, 6 month FU "never got a call from [facility] about Emgality so  I guess I don't qualify; the shot really works, have taken for 2 months"     HISTORY OF PRESENT ILLNESS:  01/12/22 ALL:  Ann Conley is a 88 y.o. female here today for follow up for migraines. She was last seen 07/2021 and advised to continue Nurtec QOD for prevention. She called 07/26/2021 reports cost of Nurtec was unaffordable. We suggested starting Emgality. Sample pens administered 08/02/2021 in the office and she verbalized willingness and comfort with continuing monthly at home. She called 09/01/2021 requesting another sample pen and for our office to help with administration. She came by the office 11/2021 and picked up Nurtec samples instead of Emgality sample. Conversation with RN 12/05/2021 reports she had returned to taking Nurtec QOD. I recommended she use Emgality for prevention and Nurtec for abortive therapy due to cost. Sample pen injected in the office 12/06/2021. She was given sample pen to administer with directions printed on packaging. She called reporting intractable migraine 12/26/2021. She reported stopping amlodipine/benazepril due to cough and interrupted sleep. She had taken tramadol and ibuprofen but not used Nurtec. She reported being unaware she could use Nurtec with other medications. She was seen by PCP 12/28/2021 and BP med changed to amlodipine '10mg'$  daily.   Today, she reports she has been better since starting the Emgality. She came into the office to receive her Emgality injection 12/06/21 and was given the pen for her injection that was due on 01/03/22. She will be due for her next injection on 01/31/22. Her insurance authorized the [employer] in 09/23, but she states she never received a call. I encouraged the patient to reach out to the pharmacy to obtain this medication.    She has lost about 20 pounds in preparation for knee surgery. Labs  followed with PCP. LDL 104. On asa '81mg'$ . Patient has been hesitant to start statin. She reports taking amlodipine as prescribed.    HISTORY (copied from previous note) 07/14/21 ALL:  Ann Conley returns for follow up for worsening migraines. She was using Nurtec with some success but has not been as effective, recently. She has started taking Nurtec every other day for prevention and felt it was working fairly well until she had a migraine with aura on 8/31. She reports migraine would "come and go" for about 3 days. She did not increase Nurtec but continued every other day dosing. She also stopped propranolol and valsartan. She is uncertain of when she topped these but thinks it was about 3-4 weeks ago. She was seen by PCP 6/23 and valsartan was listed as an active medication. She reports that leg swelling and muscle aches have been better since stopping these medications. She has been watching her diet and walking more and has lost 16 pounds.    03/07/2021 ALL: She returns for follow up for complicated migraines. MRI/MRA unremarkable. Nurtec has been helpful for abortive therapy. BP has been much better with Dr Birdie Riddle. Ann Conley states it has been elevated some at home. She has had 2-3 headaches over the past month but reports they were not bad and aborted with Nurtec. She has not picked up prescription from pharmacy.    She does report having a fall last week. She was walking through her doorway and her foot got caught on the entryway. She hit her head up on the wall  in front of her. No other injuries. She reports that her forehead has been a little sore but she is doing well, otherwise. No changes in vision. No confusion. No worsening headaches.      01/27/2021 ALL:  She returns today with concerns of left arm numbness and left visual disturbance that started 1 day ago. While playing cards, last night. She felt that her left arm was going to sleep. Numbness seemed to wax and wane and would radiate to the  left jaw. She describes a kaleidoscope disturbance of the left eye last night around the same time. Visual disturbance lasted about 10 minutes. Numbness of arm is still present, today. It does not seem to make a difference of what position arm is in. Numbness seems to wax and wane. She is constantly sensitive to light. She wears sun glasses all the time, even at home. She denies head pain. She has taken Excedrin that has not helped.   She denies chest pain, trouble breathing, palpitations, sweating, abdominal or back pain, fever, chills, body aches.    She has been working closely with PCP for uncontrolled HTN. Started on valsartan last week. She continues propranolol LA '60mg'$  daily (for migraines) and amlodipine '10mg'$  daily. She is unsure if it has made much difference. BP this morning was 200/86. Her head was not hurting when she took her blood pressure. Usual home readings have been around 160/80's.   Triptans have been avoided d/t history of suspected TIA in 2019 with sudden onset of left arm weakness and facial numbness. Symptoms resolved in about an hour. Imaging unremarkable. She also has history of complicated migraine and migraine with aura. She is usually able to abort headache with Excedrin or ibuprofen. She is concerned that arm numbness has lingered for this long as it usually resolves within 12 hours.      12/16/2020 ALL:  She returns for follow up for complicated migraines. At last visit, we encouraged her to increase propranolol. She reports taking '20mg'$  BID. She does feel that migraines are not as painful. She continues to have a visual aura with some sort of headache 1-3 times a month. PCP gave her '800mg'$  ibuprofen that seems to help. She reports taking 1-2 doses of ibuprofen.    She continues close follow up with PCP for blood pressure management on amlodipine '10mg'$  daily. She does check on occasion at home but does not remember what readings are. She reports that readings are usually "out  of the ballpark". When I ask if today's reading is high for her she reports that it has been this high at home.      09/07/2020 ALL:  Ann Conley is a 88 y.o. female here today for follow up for complicated migraines. She has history of partial vision loss with left arm numbness associated with headache. She was last seen 07/14/2020 by Dr Leta Baptist and advised to increase propranolol to '20mg'$  BID. She has not increased dose. She has had two migraines since being seen. She was evaluated by the ER on 10/6. CT brain normal. She reports symptoms resolved spontaneously without abortive therapy. She usually takes Excedrin but states that she rarely needs to take it. She reports BP is "always high". Reports it was 144/63 at home yesterday.   REVIEW OF SYSTEMS: Out of a complete 14 system review of symptoms, the patient complains only of the following symptoms, migraines, knee pain and all other reviewed systems are negative.   ALLERGIES: Allergies  Allergen Reactions  Codeine Nausea And Vomiting     HOME MEDICATIONS: Outpatient Medications Prior to Visit  Medication Sig Dispense Refill   amLODipine (NORVASC) 10 MG tablet Take 1 tablet (10 mg total) by mouth daily. 90 tablet 1   aspirin 81 MG tablet Take 81 mg by mouth every evening.      furosemide (LASIX) 20 MG tablet Take 1 tablet (20 mg total) by mouth daily. 30 tablet 3   Galcanezumab-gnlm (EMGALITY) 120 MG/ML SOAJ Inject 120 mg into the skin every 30 (thirty) days. 1 mL 5   ibuprofen (ADVIL) 800 MG tablet TAKE ONE TABLET BY MOUTH EVERY 8 HOURS AS NEEDED FOR HEADACHE 30 tablet 0   Magnesium 250 MG TABS Take 250 mg by mouth daily.     multivitamin-lutein (OCUVITE-LUTEIN) CAPS capsule Take 1 capsule by mouth daily.     omeprazole (PRILOSEC) 20 MG capsule Take 1 capsule (20 mg total) by mouth daily. 90 capsule 1   Rimegepant Sulfate (NURTEC) 75 MG TBDP Take 75 mg by mouth daily as needed (take for abortive therapy of migraine, no more than 1  tablet in 24 hours or 10 per month). 8 tablet 11   vitamin B-12 (CYANOCOBALAMIN) 100 MCG tablet Take 100 mcg by mouth daily.     aspirin-acetaminophen-caffeine (EXCEDRIN MIGRAINE) 250-250-65 MG tablet Take by mouth every 6 (six) hours as needed for headache.     Ascorbic Acid (VITAMIN C PO) Take by mouth. (Patient not taking: Reported on 01/12/2022)     No facility-administered medications prior to visit.     PAST MEDICAL HISTORY: Past Medical History:  Diagnosis Date   Anemia    Blood transfusion complicating pregnancy 7341   GERD (gastroesophageal reflux disease)    History of chicken pox    Hypertension    Migraine    occular   TIA (transient ischemic attack)      PAST SURGICAL HISTORY: Past Surgical History:  Procedure Laterality Date   BREAST BIOPSY     CHOLECYSTECTOMY     PARTIAL HYSTERECTOMY       FAMILY HISTORY: Family History  Problem Relation Age of Onset   Breast cancer Mother    Heart disease Father    Heart disease Sister        pacemaker   Diabetes Sister      SOCIAL HISTORY: Social History   Socioeconomic History   Marital status: Married    Spouse name: Not on file   Number of children: 5   Years of education: Not on file   Highest education level: Bachelor's degree (e.g., BA, AB, BS)  Occupational History   Not on file  Tobacco Use   Smoking status: Never   Smokeless tobacco: Never  Vaping Use   Vaping Use: Never used  Substance and Sexual Activity   Alcohol use: Yes    Alcohol/week: 0.0 standard drinks    Comment: wine occ.   Drug use: No   Sexual activity: Not on file  Other Topics Concern   Not on file  Social History Narrative   Lives with husband.  5 children.  Education BA.   Social Determinants of Health   Financial Resource Strain: Low Risk    Difficulty of Paying Living Expenses: Not hard at all  Food Insecurity: No Food Insecurity   Worried About Charity fundraiser in the Last Year: Never true   Ran Out of Food in  the Last Year: Never true  Transportation Needs: No Transportation Needs  Lack of Transportation (Medical): No   Lack of Transportation (Non-Medical): No  Physical Activity: Not on file  Stress: No Stress Concern Present   Feeling of Stress : Not at all  Social Connections: Not on file  Intimate Partner Violence: Not on file     PHYSICAL EXAM  Vitals:   01/12/22 1303  BP: (!) 153/75  Pulse: 87  Weight: 211 lb (95.7 kg)  Height: 5' (1.524 m)   Body mass index is 41.21 kg/m.  Generalized: Well developed, in no acute distress  Cardiology: normal rate and rhythm, no murmur auscultated  Respiratory: clear to auscultation bilaterally    Neurological examination  Mentation: Alert oriented to time, place, history taking. Follows all commands speech and language fluent Cranial nerve II-XII: Pupils were equal round reactive to light. Right iris with darker coloring at 6 o'clock (baseline). Extraocular movements were full, visual field were full on confrontational test. Facial sensation and strength were normal. Head turning and shoulder shrug  were normal and symmetric. Motor: The motor testing reveals 5 over 5 strength of all 4 extremities. Good symmetric motor tone is noted throughout.  Gait and station: Gait is arthritic, stable with cane     DIAGNOSTIC DATA (LABS, IMAGING, TESTING) - I reviewed patient records, labs, notes, testing and imaging myself where available.  Lab Results  Component Value Date   WBC 6.6 12/28/2021   HGB 12.8 12/28/2021   HCT 38.7 12/28/2021   MCV 85.8 12/28/2021   PLT 249.0 12/28/2021      Component Value Date/Time   NA 133 (L) 12/28/2021 1229   K 3.9 12/28/2021 1229   CL 98 12/28/2021 1229   CO2 30 12/28/2021 1229   GLUCOSE 121 (H) 12/28/2021 1229   BUN 13 12/28/2021 1229   CREATININE 0.51 12/28/2021 1229   CREATININE 0.56 (L) 12/10/2015 1537   CALCIUM 9.1 12/28/2021 1229   PROT 6.9 12/28/2021 1229   ALBUMIN 4.2 12/28/2021 1229   AST  14 12/28/2021 1229   ALT 12 12/28/2021 1229   ALKPHOS 97 12/28/2021 1229   BILITOT 0.5 12/28/2021 1229   GFRNONAA >60 01/27/2021 1757   GFRAA >60 08/10/2018 1330   Lab Results  Component Value Date   CHOL 187 12/28/2021   HDL 66.00 12/28/2021   LDLCALC 104 (H) 12/28/2021   LDLDIRECT 125.0 09/09/2012   TRIG 82.0 12/28/2021   CHOLHDL 3 12/28/2021   Lab Results  Component Value Date   HGBA1C 5.7 09/12/2019   No results found for: VITAMINB12 Lab Results  Component Value Date   TSH 1.93 12/28/2021    MMSE - Mini Mental State Exam 09/20/2017  Orientation to time 5  Orientation to Place 5  Registration 3  Attention/ Calculation 5  Recall 2  Language- name 2 objects 2  Language- repeat 1  Language- follow 3 step command 3  Language- read & follow direction 1  Write a sentence 1  Copy design 1  Total score 29     No flowsheet data found.   ASSESSMENT AND PLAN  88 y.o. year old female  has a past medical history of Anemia, Blood transfusion complicating pregnancy (2505), GERD (gastroesophageal reflux disease), History of chicken pox, Hypertension, Migraine, and TIA (transient ischemic attack). here with    Migraine with aura and without status migrainosus, not intractable  History of transient ischemic attack (TIA)  Essential hypertension  Tida reports migraines are well managed on Emgality. I have used teachback in the office, today, to ensure she  is aware of appropraite administration and storage of Emgality. I have provided detailed instructions for next due date and to continue 1 injection on the 28th of every month. I have asked her to write the due date on her Emgality box before placing in the refrigerator. She may continue Nurtec as needed. I have written out specific dosing instructions for Tylenol and ibuprofen if needed and educated her that they may be used with Nurtec for intractable headaches. I will ensure both medications have current refills. She was  encouraged to continue amlodipine for BP management. She will continue close follow up with PCP. She may return in 1 year, sooner if needed.    No orders of the defined types were placed in this encounter.    No orders of the defined types were placed in this encounter.     Debbora Presto, MSN, FNP-C 01/12/2022, 3:01 PM  [facility] [address], [place], [place] [address] [phone]

## 2022-01-11 NOTE — Patient Instructions (Addendum)
Below is our plan: ? ?We will continue with the Emgality 1 injection every single month. Take Nurtec only as needed. Only take the Nurtec when you have a breakthrough migraine. You can also try over the counter ibuprofen or tylenol for some minor headaches. It is ok to take ibuprofen up to '800mg'$  every 8 hours and Tylenol up to '1000mg'$  every 6-8 hours with Nurtec for really bad headaches.  ? ?Please call the pharmacy today to let them know that you will be filling the prescription so they can get it ready for you. Store your injection in the refrigerator until you are ready to inject.  ? ?You need to do the next injection on 3/28 and every month following on the 28th ? ?Please continue amlodipine '10mg'$  daily as directed by Dr Birdie Riddle. Keep a close eye on your blood pressure.  ? ?Please make sure you are staying well hydrated. I recommend 50-60 ounces daily. Well balanced diet and regular exercise encouraged. Consistent sleep schedule with 6-8 hours recommended.  ? ?Please continue follow up with care team as directed.  ? ?Follow up with me in 1 year if doing well, sooner if needed  ? ?You may receive a survey regarding today's visit. I encourage you to leave honest feed back as I do use this information to improve patient care. Thank you for seeing me today!  ?  ?

## 2022-01-12 ENCOUNTER — Encounter: Payer: Self-pay | Admitting: Family Medicine

## 2022-01-12 ENCOUNTER — Ambulatory Visit: Payer: Medicare Other | Admitting: Family Medicine

## 2022-01-12 ENCOUNTER — Other Ambulatory Visit: Payer: Self-pay

## 2022-01-12 VITALS — BP 153/75 | HR 87 | Ht 60.0 in | Wt 211.0 lb

## 2022-01-12 DIAGNOSIS — I10 Essential (primary) hypertension: Secondary | ICD-10-CM | POA: Diagnosis not present

## 2022-01-12 DIAGNOSIS — Z8673 Personal history of transient ischemic attack (TIA), and cerebral infarction without residual deficits: Secondary | ICD-10-CM | POA: Diagnosis not present

## 2022-01-12 DIAGNOSIS — G43109 Migraine with aura, not intractable, without status migrainosus: Secondary | ICD-10-CM

## 2022-01-12 MED ORDER — NURTEC 75 MG PO TBDP
75.0000 mg | ORAL_TABLET | Freq: Every day | ORAL | 11 refills | Status: DC | PRN
Start: 2022-01-12 — End: 2023-01-22

## 2022-01-12 MED ORDER — EMGALITY 120 MG/ML ~~LOC~~ SOAJ: 120.0000 mg | SUBCUTANEOUS | 3 refills | Status: DC

## 2022-01-16 NOTE — Progress Notes (Unsigned)
Chronic Care Management Pharmacy Note  01/16/2022 Name:  [name] MRN:  [id number] DOB:  1934/02/24  Summary: Initial visit with PharmD.  Meds updated.  BP elevated during call 176/98 and 155/79.  Had not taken her BP med yet today.  Now taking amlodipine/benazepril 10-51m.  Also needs help with copay on Em[employer]ality which is being handles by [employer].  Recommendations/Changes made from today's visit: Follow BP closely - asked her to monitor.  If remains elevated could increase benazepril to 426m FU on LiAssurantPlan: CMA 2 weeks BP PharmD 3 months   Subjective: Ann Conley an [age].o. year old female who is a primary patient of [name], [name].  The CCM team was consulted for assistance with disease management and care coordination needs.    Engaged with patient face to face for initial visit in response to provider referral for pharmacy case management and/or care coordination services.   Consent to Services:  The patient was given the following information about Chronic Care Management services today, agreed to services, and gave verbal consent: 1. CCM service includes personalized support from designated clinical staff supervised by the primary care provider, including individualized plan of care and coordination with other care providers 2. 24/7 contact phone numbers for assistance for urgent and routine care needs. 3. Service will only be billed when office clinical staff spend 20 minutes or more in a month to coordinate care. 4. Only one practitioner may furnish and bill the service in a calendar month. 5.The patient may stop CCM services at any time (effective at the end of the month) by phone call to the office staff. 6. The patient will be responsible for cost sharing (co-pay) of up to 20% of the service fee (after annual deductible is met). Patient agreed to services and consent obtained.  Patient Care Team: Ann MiniumMD as PCP -  General (Family Medicine) Ann HarpMD as Consulting Physician (Cardiology) Ann BombardMD as Consulting Physician (Neurology) Ann CantorMD as Consulting Physician (Ophthalmology) SaDimitri PedRN as Case Manager DaEdythe ClarityRPMount Sinai Rehabilitation HospitalPharmacist)  Recent office visits:  08/30/21 KaAnnye AsaMD (PCP) - Family Medicine (Nurse visit) - Hypertension - Blood pressure check. No medication changes. Follow up as scheduled.    08/16/21 RiMaximiano CossNP - Family Medicine - Hypertension - amLODipine-benazepril (LOTREL) 10-20 MG capsule prescribed. Stop amlodipine. Start amlodipine-benazepril. Return for bp check in 1-2 week, nurse visit.   04/28/21 KaAnnye AsaMD (PCP) - Family Medicine - Bilateral Swelling of feet - Furosemide (LASIX) 20 MG tablet prescribed prn. Follow up as scheduled.    03/17/21 KaAreatha KeasMD (PCP) - Family Medicine - Annual Physical Exam - Labs were ordered. Mammogram ordered. No medication changes. Follow up in 6 months.      Recent consult visits:  07/14/21 AmDebbora PrestoNP - Neurology - Migraine - recommended CGRP injection monthly for prevention but she wishes to hold off for now. She will continue Nurtec every other day. BP is a little elevated today but is much better than previous readings. I am concerned it may continue to increase as she has stopped propranolol and valsartan. She will continue close follow up with Dr TaBirdie RiddleFollow up in 6 months.   Hospital visits:  None in previous 6 months   Objective:  Lab Results  Component Value Date   CREATININE 0.51 12/28/2021   BUN 13 12/28/2021   GFR 85.25  12/28/2021   GFRNONAA >60 01/27/2021   GFRAA >60 08/10/2018   NA 133 (L) 12/28/2021   K 3.9 12/28/2021   CALCIUM 9.1 12/28/2021   CO2 30 12/28/2021   GLUCOSE 121 (H) 12/28/2021    Lab Results  Component Value Date/Time   HGBA1C 5.7 09/12/2019 11:36 AM   HGBA1C 5.6 09/11/2018 09:34 AM   GFR 85.25 12/28/2021 12:29  PM   GFR 83.81 03/17/2021 02:07 PM    Last diabetic Eye exam: No results found for: HMDIABEYEEXA  Last diabetic Foot exam: No results found for: HMDIABFOOTEX   Lab Results  Component Value Date   CHOL 187 12/28/2021   HDL 66.00 12/28/2021   LDLCALC 104 (H) 12/28/2021   LDLDIRECT 125.0 09/09/2012   TRIG 82.0 12/28/2021   CHOLHDL 3 12/28/2021    Hepatic Function Latest Ref Rng & Units 12/28/2021 03/17/2021 01/27/2021  Total Protein 6.0 - 8.3 g/dL 6.9 7.1 7.5  Albumin 3.5 - 5.2 g/dL 4.2 4.2 3.9  AST 0 - 37 U/L _0 ALT 0 - 35 U/L _1 Alk Phosphatase 39 - 117 U/L 97 85 82  Total Bilirubin 0.2 - 1.2 mg/dL 0.5 0.4 0.3  Bilirubin, Direct 0.0 - 0.3 mg/dL 0.1 0.0 -    Lab Results  Component Value Date/Time   TSH 1.93 12/28/2021 12:29 PM   TSH 2.16 03/17/2021 02:07 PM    CBC Latest Ref Rng & Units 12/28/2021 03/17/2021 01/27/2021  WBC 4.0 - 10.5 K/uL 6.6 6.6 7.0  Hemoglobin 12.0 - 15.0 g/dL 12.8 12.5 13.1  Hematocrit 36.0 - 46.0 % 38.7 37.2 39.9  Platelets 150.0 - 400.0 K/uL 249.0 247.0 268    No results found for: VD25OH  Clinical ASCVD: No  The ASCVD Risk score (Arnett DK, et al., 2019) failed to calculate for the following reasons:   The 2019 ASCVD risk score is only valid for ages 49 to 51    Depression screen PHQ 2/9 12/28/2021 08/16/2021 07/20/2021  Decreased Interest 0 0 0  Down, Depressed, Hopeless 0 0 0  PHQ - 2 Score 0 0 0  Altered sleeping 0 - -  Tired, decreased energy 0 - -  Change in appetite 0 - -  Feeling bad or failure about yourself  0 - -  Trouble concentrating 0 - -  Moving slowly or fidgety/restless 0 - -  Suicidal thoughts 0 - -  PHQ-9 Score 0 - -  Difficult doing work/chores Not difficult at all - -  Some recent data might be hidden    Social History   Tobacco Use  Smoking Status Never  Smokeless Tobacco Never   BP Readings from Last 3 Encounters:  01/12/22 (!) 153/75  12/28/21 (!) 150/72  08/16/21 (!) 156/78   Pulse Readings from  Last 3 Encounters:  01/12/22 87  12/28/21 96  08/16/21 87   Wt Readings from Last 3 Encounters:  01/12/22 211 lb (95.7 kg)  12/28/21 208 lb 6.4 oz (94.5 kg)  08/16/21 216 lb 9.6 oz (98.2 kg)   BMI Readings from Last 3 Encounters:  01/12/22 41.21 kg/m  12/28/21 40.70 kg/m  08/16/21 42.30 kg/m    Assessment/Interventions: Review of patient past medical history, allergies, medications, health status, including review of consultants reports, laboratory and other test data, was performed as part of comprehensive evaluation and provision of chronic care management services.   SDOH:  (Social Determinants of Health) assessments and interventions performed: Yes  Financial Resource Strain: Low Risk  Difficulty of Paying Living Expenses: Not hard at all    SDOH Screenings   Alcohol Screen: Not on file  Depression (PHQ2-9): Low Risk    PHQ-2 Score: 0  Financial Resource Strain: Low Risk    Difficulty of Paying Living Expenses: Not hard at all  Food Insecurity: No Food Insecurity   Worried About Charity fundraiser in the Last Year: Never true   Ran Out of Food in the Last Year: Never true  Housing: Low Risk    Last Housing Risk Score: 0  Physical Activity: Not on file  Social Connections: Not on file  Stress: No Stress Concern Present   Feeling of Stress : Not at all  Tobacco Use: Low Risk    Smoking Tobacco Use: Never   Smokeless Tobacco Use: Never   Passive Exposure: Not on file  Transportation Needs: No Transportation Needs   Lack of Transportation (Medical): No   Lack of Transportation (Non-Medical): No    CCM Care Plan  Allergies  Allergen Reactions   Codeine Nausea And Vomiting    Medications Reviewed Today     Reviewed by Debbora Presto, NP (Nurse Practitioner) on 01/12/22 at 1511  Med List Status: <None>   Medication Order Taking? Sig Documenting Provider Last Dose Status Informant  amLODipine (NORVASC) 10 MG tablet [id number] Yes Take 1 tablet (10 mg total)  by mouth daily. Midge Minium, MD Taking Active   Ascorbic Acid (VITAMIN C PO) [id number] No Take by mouth.  Patient not taking: Reported on 01/12/2022   [provider] Not Taking Active   aspirin 81 MG tablet [id number] Yes Take 81 mg by mouth every evening.  [provider] Taking Active Self  furosemide (LASIX) 20 MG tablet [id number] Yes Take 1 tablet (20 mg total) by mouth daily. Midge Minium, MD Taking Active   Galcanezumab-gnlm [facility]) 120 MG/ML Darden Palmer [id number]  Inject 120 mg into the skin every 30 (thirty) days. Lomax, Amy, NP  Active   ibuprofen (ADVIL) 800 MG tablet [id number] Yes TAKE ONE TABLET BY MOUTH EVERY 8 HOURS AS NEEDED FOR HEADACHE Midge Minium, MD Taking Active   Magnesium 250 MG TABS [id number] Yes Take 250 mg by mouth daily. [provider] Taking Active   multivitamin-lutein [facility]) CAPS capsule [id number] Yes Take 1 capsule by mouth daily. [provider] Taking Active Self  omeprazole (PRILOSEC) 20 MG capsule [id number] Yes Take 1 capsule (20 mg total) by mouth daily. Midge Minium, MD Taking Active   Rimegepant Sulfate (NURTEC) 75 MG TBDP [id number]  Take 75 mg by mouth daily as needed (take for abortive therapy of migraine, no more than 1 tablet in 24 hours or 8 per month). Lomax, Amy, NP  Active   vitamin B-12 (CYANOCOBALAMIN) 100 MCG tablet [id number] Yes Take 100 mcg by mouth daily. [provider] Taking Active Self            Patient Active Problem List   Diagnosis Date Noted   ACE-inhibitor cough 01/03/2022   Primary stabbing headache 09/11/2018   Primary osteoarthritis of both knees 06/01/2017   Burning mouth syndrome 04/13/2016   Bleeding hemorrhoid 82/80/0349   Complicated migraine 17/91/5056   Keratoma 10/19/2014   Pain in lower limb 10/19/2014   Hammer toe of left foot 10/19/2014   Morbid obesity ([place]) 09/17/2014   Snoring 03/13/2013   Left anterior knee pain 03/13/2013    Mole (skin) 09/18/2011   Migraine with aura 05/07/2011   Essential hypertension  04/25/2011   GERD (gastroesophageal reflux disease) 04/25/2011   Annual physical exam 04/25/2011   BREAST CANCER, HX OF 06/21/2007    Immunization History  Administered Date(s) Administered   Moderna Sars-Covid-2 Vaccination 01/23/2020, 02/23/2020   Pneumococcal Conjugate-13 09/17/2014   Pneumococcal Polysaccharide-23 09/09/2012   Tdap 09/01/2016    Conditions to be addressed/monitored:  HTN, GERD, Osteoarthritis, Obesity  There are no care plans that you recently modified to display for this patient.     Medication Assistance: Application for Emgality  medication assistance program. in process.  Anticipated assistance start date unknown.  See plan of care for additional detail.  Compliance/Adherence/Medication fill history: Care Gaps: Dexa Scan  Star-Rating Drugs: N/A  Patient's preferred pharmacy is:  [id number] [place] [place] [address] Phone: [phone] Fax: [phone]   Uses pill box? Yes Pt endorses 100% compliance  We discussed: Benefits of medication synchronization, packaging and delivery as well as enhanced pharmacist oversight with [employer]. Patient decided to: Continue current medication management strategy  Care Plan and Follow Up Patient Decision:  Patient agrees to Care Plan and Follow-up.  Plan: The care management team will reach out to the patient again over the next 30 days.  Beverly Milch, PharmD Clinical Pharmacist  [facility] [phone]     Current Barriers:  Unable to independently afford treatment regimen Unable to achieve control of blood pressure   Pharmacist Clinical Goal(s):  Patient will verbalize ability to afford treatment regimen achieve improvement in BP as evidenced by home monitoring through collaboration with PharmD and provider.    Interventions: 1:1 collaboration with Midge Minium, MD regarding development and update of comprehensive plan of care as evidenced by provider attestation and co-signature Inter-disciplinary care team collaboration (see longitudinal plan of care) Comprehensive medication review performed; medication list updated in electronic medical record  Hypertension (BP goal <140/90) -Not ideally controlled -Current treatment: Amlodipine-benazepril 10-17m daily -Medications previously tried: Amlodipine   -Current home readings: 176/98, 155/79 today when she checked x 2 -Current exercise habits: Less active lately due to having family in town -Denies hypotensive/hypertensive symptoms -Educated on BP goals and benefits of medications for prevention of heart attack, stroke and kidney damage; Importance of home blood pressure monitoring; Proper BP monitoring technique; Symptoms of hypotension and importance of maintaining adequate hydration; -Counseled to monitor BP at home a few times per week at various times, document, and provide log at future appointments -Recommended to continue current medication BP elevated - had not taken medication yet today because she takes in PM.  Have asked her to monitor really good for a few weeks at differing times of day.  Will call and check in on BP in 2 weeks right after Christmas.  If remains elevated - would consider increasing dose of Benazepril to 468m  Osteoarthritis (Goal: Minimize pain) -Controlled -Current treatment  IBU 80064m8h prn -Medications previously tried: Tramadol -Patient mentions she often has pain from osteoarthritis.  She does not like to take a lot of medication.  She has [place] [facility] type clinic.  -Recommended to continue current medication  GERD (Goal: Minimize symptoms) -Controlled -Current treatment  Omeprazole 54m60m 10 year history of med  -Medications previously tried: none noted -Rarely uses this medication - denies any symptoms of acid reflux  recently  -Recommended she continue to watch trigger foods.  No need to taper since she has not taken consistently. -I believe she would benefit from DEXA scan, she does not feel at her age she would want to do this.  Migraine w/ Aura (Goal: Reduce occurrence) -Controlled -Current treatment  Emgality 144m every 30 days Excedrin Migraine 250-250-65 prn (rarely takes) -Medications previously tried: Nurtec -Currently taking Emgality and it is working great, the only worry she has is about the copay.  -Recommended to continue current medication Assessed patient finances. She is currently working with [facility] [place] [facility] that prescribed it.  Provided [employer] today for her to follow up on application.  Offered assistance if needed further in getting approval so she can continue this medication.  Patient Goals/Self-Care Activities Patient will:  - check blood pressure weekly, document, and provide at future appointments collaborate with provider on medication access solutions  Follow Up Plan: The care management team will reach out to the patient again over the next 30 days.

## 2022-01-18 ENCOUNTER — Ambulatory Visit (INDEPENDENT_AMBULATORY_CARE_PROVIDER_SITE_OTHER): Payer: Medicare Other | Admitting: Pharmacist

## 2022-01-18 DIAGNOSIS — G43109 Migraine with aura, not intractable, without status migrainosus: Secondary | ICD-10-CM

## 2022-01-18 DIAGNOSIS — I10 Essential (primary) hypertension: Secondary | ICD-10-CM

## 2022-01-19 NOTE — Patient Instructions (Addendum)
Visit Information ? ? Goals Addressed   ? ?  ?  ?  ?  ? This Visit's Progress  ?  :Track and Manage My Blood Pressure-Hypertension   On track  ?  Resolving due to duplicate goal  ?Timeframe:  Long-Range Goal ?Priority:  Medium ?Start Date:   10/19/21                          ?Expected End Date:   04/19/22              ? ?Follow Up Date 01/17/22  ?  ?- check blood pressure 3 times per week ?- choose a place to take my blood pressure (home, clinic or office, retail store) ?- write blood pressure results in a log or diary  ?  ?Why is this important?   ?You won't feel high blood pressure, but it can still hurt your blood vessels.  ?High blood pressure can cause heart or kidney problems. It can also cause a stroke.  ?Making lifestyle changes like losing a little weight or eating less salt will help.  ?Checking your blood pressure at home and at different times of the day can help to control blood pressure.  ?If the doctor prescribes medicine remember to take it the way the doctor ordered.  ?Call the office if you cannot afford the medicine or if there are questions about it.   ?  ?Notes:  ?  ? ?  ? ?Patient Care Plan: Chronic pain from migraine and osteoarthritis  ?Completed 09/21/2021  ? ?Problem Identified: Need for long term self management of chronic pain from migraine and osteoarthritis Resolved 09/21/2021  ?Priority: High  ?  ? ?Long-Range Goal: Manage Pain Completed 09/21/2021  ?Start Date: 07/20/2021  ?Expected End Date: 01/04/2022  ?Recent Progress: On track  ?Priority: High  ?Note:   ?Resolving due to duplicate goal  ?Current Barriers:  ?Knowledge Deficits related to self-health management of acute or chronic pain from migraines and osteoarthritis ?Chronic Disease Management support and education needs related to chronic pain from migraine and osteoarthritis ?Knowledge Deficits related to Chronic pain from migraine and osteoarthritis ?Chronic Disease Management support and education needs related to Chronic pain  from migraine and osteoarthritis ?Film/video editor. Cost of Nurtec ?Unable to independently Chronic pain from migraine and osteoarthritis ?States that her migraine's are helped when she takes the Nurtec but she is in the doughnut hole and can not afford the medication now.  States she does take Excedrin and Motrin as neurology told her to take as needed.  States her lt knee may need to be replaced and she is to see orthopedics tomorrow.  States she has been working on weight loss by eating less and walking for exercise so that she can have surgery if needed ?Clinical Goal(s):  ?patient will verbalize understanding of plan for pain management. , patient will attend all scheduled medical appointments: CCM PharmD 09/28/21, neurology 01/12/21, orthopedics 07/21/21, patient will demonstrate use of different relaxation  skills and/or diversional activities to assist with pain reduction (distraction, imagery, relaxation, massage, acupressure, TENS, heat, and cold application., patient will report pain at a level less than 3 to 4 on a 10-10 rating scale., patient will use pharmacological and nonpharmacological pain relief strategies as prescribed. , and patient will verbalize acceptable level of pain relief and ability to engage in desired activities ?Interventions:  ?Collaboration with Midge Minium, MD regarding development and update of comprehensive plan of care  as evidenced by provider attestation and co-signature ?Pain assessment performed ?Medications reviewed ?Discussed plans with patient for ongoing care management follow up and provided patient with direct contact information for care management team ?Evaluation of current treatment plan related to Chronic pain from migraine and osteoarthritis and patient's adherence to plan as established by provider. ?Provided education to patient re: Chronic pain from migraine and osteoarthritis ?Reviewed medications with patient and discussed adherence to medications   ?Reviewed scheduled/upcoming provider appointments including:  ?Pharmacy referral for cost of Nurtec and scheduled for 09/28/21 ?Discussed plans with patient for ongoing care management follow up and provided patient with direct contact information for care management team ?Reviewed to cal RNCM with their household income to give to [facility] PharmD to see they they will qualify for pharmacy assistance ?Patient Goals/Self Care Activities:  ?Will self-administer medications as prescribed ?Will attend all scheduled provider appointments ?Will call pharmacy for medication refills 7 days prior to needed refill date ?Patient will calls provider office for new concerns or questions ?Follow Up Plan: Telephone follow up appointment with care management team member scheduled for: 08/31/21 at 9:45 AM ?The patient has been provided with contact information for the care management team and has been advised to call with any health related questions or concerns.  ?  ?  ? ?Patient Care Plan: Lack of long term self managment of Hypertension  ?Completed 09/21/2021  ? ?Problem Identified: Hypertension (Hypertension) Resolved 09/21/2021  ?  ? ?Long-Range Goal: Effective  long term self managment of Hypertension Completed 09/21/2021  ?Start Date: 07/20/2021  ?Expected End Date: 01/04/2022  ?Recent Progress: On track  ?Priority: Medium  ?Note:   ?Resolving due to duplicate goal  ?Current Barriers:  ?Knowledge Deficits related to basic understanding of hypertension pathophysiology and self care management ?Unable to independently self manage Hypertension ?Does not adhere to prescribed medication regimen ?States she checks her B/P about 3 times a week and it was 144/75 today.  States she stopped taking the Valsartan because it made her legs hurt.  States she try to eat health but she is on a Keto diet with more meats.  States she is walking for exercise and would like to lose 10 more lbs ?Nurse Case Manager Clinical Goal(s):  ?patient will  verbalize understanding of plan for hypertension management ?patient will not experience hospital admission. Hospital Admissions in last 6 months = 0 ?patient will attend all scheduled medical appointments: CCM PharmD 09/28/21 ?patient will demonstrate improved adherence to prescribed treatment plan for hypertension as evidenced by taking all medications as prescribed, monitoring and recording blood pressure as directed, adhering to low sodium/DASH diet ?patient will demonstrate improved health management independence as evidenced by checking blood pressure as directed and notifying PCP if SBP>160 or DBP > 90, taking all medications as prescribe, and adhering to a low sodium diet as discussed. ?patient will verbalize basic understanding of hypertension disease process and self health management plan as evidenced by readings within limits, adherence to diet and medications ?Interventions:  ?Collaboration with Midge Minium, MD regarding development and update of comprehensive plan of care as evidenced by provider attestation and co-signature ?Inter-disciplinary care team collaboration (see longitudinal plan of care) ?Evaluation of current treatment plan related to hypertension self management and patient's adherence to plan as established by provider. ?Provided education to patient re: stroke prevention, s/s of heart attack and stroke, DASH diet, complications of uncontrolled blood pressure ?Reviewed medications with patient and discussed importance of compliance ?Discussed plans with patient for ongoing  care management follow up and provided patient with direct contact information for care management team ?Advised patient, providing education and rationale, to monitor blood pressure daily and record, calling PCP for findings outside established parameters.  ?Reviewed scheduled/upcoming provider appointments including: CCM PharmD 09/28/21 ?Self-Care Activities:  ?Self administers medications as  prescribed ?Attends all scheduled provider appointments ?Calls pharmacy for medication refills ?Calls provider office for new concerns or questions ?Patient Goals ? - Self administer medications as prescribed ? - Attend all

## 2022-01-23 ENCOUNTER — Encounter: Payer: Self-pay | Admitting: Family Medicine

## 2022-01-23 ENCOUNTER — Telehealth (INDEPENDENT_AMBULATORY_CARE_PROVIDER_SITE_OTHER): Payer: Medicare Other | Admitting: Family Medicine

## 2022-01-23 DIAGNOSIS — R051 Acute cough: Secondary | ICD-10-CM

## 2022-01-23 DIAGNOSIS — J301 Allergic rhinitis due to pollen: Secondary | ICD-10-CM | POA: Diagnosis not present

## 2022-01-23 MED ORDER — PREDNISONE 10 MG PO TABS: ORAL_TABLET | ORAL | 0 refills | Status: DC

## 2022-01-23 MED ORDER — PROMETHAZINE-DM 6.25-15 MG/5ML PO SYRP: 5.0000 mL | ORAL_SOLUTION | Freq: Four times a day (QID) | ORAL | 0 refills | Status: DC | PRN

## 2022-01-23 NOTE — Progress Notes (Signed)
? ?Virtual Visit via Video  ? ?I connected with patient on 01/23/22 at 10:30 AM EDT by a video enabled telemedicine application and verified that I am speaking with the correct person using two identifiers. ? ?Location patient: Home ?Location provider: [facility], Office ?Persons participating in the virtual visit: Patient, Provider, [place] [employer] Billings C) ? ?I discussed the limitations of evaluation and management by telemedicine and the availability of in person appointments. The patient expressed understanding and agreed to proceed. ? ?Subjective:  ? ?HPI:  ? ?Cough- pt reports several weeks of sxs.  Has tried OTC meds w/o relief.  Cough is not productive.  Denies SOB and wheezing.  No fever.  + hoarseness.  + PND.  'i just have this cough that makes you feel really bad'.   Not taking any daily allergy medication but a friend gave her some.  Cough is triggering migraine. ? ?ROS:  ? ?See pertinent positives and negatives per HPI. ? ?Patient Active Problem List  ? Diagnosis Date Noted  ? ACE-inhibitor cough 01/03/2022  ? Primary stabbing headache 09/11/2018  ? Primary osteoarthritis of both knees 06/01/2017  ? Burning mouth syndrome 04/13/2016  ? Bleeding hemorrhoid 04/13/2016  ? Complicated migraine 84/69/6295  ? Keratoma 10/19/2014  ? Pain in lower limb 10/19/2014  ? Hammer toe of left foot 10/19/2014  ? Morbid obesity ([place]) 09/17/2014  ? Snoring 03/13/2013  ? Left anterior knee pain 03/13/2013  ? Mole (skin) 09/18/2011  ? Migraine with aura 05/07/2011  ? Essential hypertension 04/25/2011  ? GERD (gastroesophageal reflux disease) 04/25/2011  ? Annual physical exam 04/25/2011  ? BREAST CANCER, HX OF 06/21/2007  ?  ?Social History  ? ?Tobacco Use  ? Smoking status: Never  ? Smokeless tobacco: Never  ?Substance Use Topics  ? Alcohol use: Yes  ?  Alcohol/week: 0.0 standard drinks  ?  Comment: wine occ.  ? ? ?Current Outpatient Medications:  ?  amLODipine (NORVASC) 10 MG tablet, Take 1 tablet (10 mg total) by mouth  daily., Disp: 90 tablet, Rfl: 1 ?  Ascorbic Acid (VITAMIN C PO), Take by mouth., Disp: , Rfl:  ?  aspirin 81 MG tablet, Take 81 mg by mouth every evening. , Disp: , Rfl:  ?  furosemide (LASIX) 20 MG tablet, Take 1 tablet (20 mg total) by mouth daily., Disp: 30 tablet, Rfl: 3 ?  Galcanezumab-gnlm (EMGALITY) 120 MG/ML SOAJ, Inject 120 mg into the skin every 30 (thirty) days., Disp: 3 mL, Rfl: 3 ?  ibuprofen (ADVIL) 800 MG tablet, TAKE ONE TABLET BY MOUTH EVERY 8 HOURS AS NEEDED FOR HEADACHE, Disp: 30 tablet, Rfl: 0 ?  Magnesium 250 MG TABS, Take 250 mg by mouth daily., Disp: , Rfl:  ?  multivitamin-lutein (OCUVITE-LUTEIN) CAPS capsule, Take 1 capsule by mouth daily., Disp: , Rfl:  ?  omeprazole (PRILOSEC) 20 MG capsule, Take 1 capsule (20 mg total) by mouth daily., Disp: 90 capsule, Rfl: 1 ?  Rimegepant Sulfate (NURTEC) 75 MG TBDP, Take 75 mg by mouth daily as needed (take for abortive therapy of migraine, no more than 1 tablet in 24 hours or 8 per month)., Disp: 8 tablet, Rfl: 11 ?  vitamin B-12 (CYANOCOBALAMIN) 100 MCG tablet, Take 100 mcg by mouth daily., Disp: , Rfl:  ? ?Allergies  ?Allergen Reactions  ? Codeine Nausea And Vomiting  ? ? ?Objective:  ? ?There were no vitals taken for this visit. ?AAOx3, NAD ?NCAT, EOMI ?No obvious CN deficits ?Coloring WNL ?Pt is able to speak  clearly, coherently without shortness of breath or increased work of breathing.  ?+ audible hoarseness ?Thought process is linear.  Mood is appropriate.  ? ?Assessment and Plan:  ? ?Cough- new.  Pt does not seem to have a bacterial infxn so this is likely a viral/allergy combo.  Will start Prednisone to improve airway inflammation and help cough.  Cough syrup to help w/ rest at night.  Daily antihistamine to decrease PND.  Reviewed supportive care and red flags that should prompt return.  Pt expressed understanding and is in agreement w/ plan.  ? ?Allergic rhinitis- new.  Pt reports no hx of seasonal allergies but does report quite a bit of  PND over the last few weeks.  This would correlate w/ the increased pollen levels.  Encouraged her to start the daily allergy medication her friend provided.  Pt expressed understanding and is in agreement w/ plan.  ? ? ?Annye Asa, MD ?01/23/2022 ? ? ?

## 2022-01-24 ENCOUNTER — Ambulatory Visit: Payer: Medicare Other | Admitting: Registered Nurse

## 2022-01-24 DIAGNOSIS — M1711 Unilateral primary osteoarthritis, right knee: Secondary | ICD-10-CM | POA: Diagnosis not present

## 2022-01-24 DIAGNOSIS — M1712 Unilateral primary osteoarthritis, left knee: Secondary | ICD-10-CM | POA: Diagnosis not present

## 2022-01-26 ENCOUNTER — Ambulatory Visit: Payer: Medicare Other | Admitting: Family Medicine

## 2022-01-31 ENCOUNTER — Other Ambulatory Visit: Payer: Self-pay

## 2022-01-31 DIAGNOSIS — G43109 Migraine with aura, not intractable, without status migrainosus: Secondary | ICD-10-CM

## 2022-01-31 MED ORDER — IBUPROFEN 800 MG PO TABS: ORAL_TABLET | ORAL | 0 refills | Status: DC

## 2022-02-03 DIAGNOSIS — I10 Essential (primary) hypertension: Secondary | ICD-10-CM

## 2022-02-07 ENCOUNTER — Telehealth: Payer: Self-pay | Admitting: Pharmacist

## 2022-02-07 ENCOUNTER — Ambulatory Visit: Payer: Medicare Other | Admitting: Family Medicine

## 2022-02-07 NOTE — Progress Notes (Signed)
? ? ?Chronic Care Management ?Pharmacy Assistant  ? ?Name: Ann Conley  MRN: [id number] DOB: 10-23-1934 ? ? ?Reason for Encounter: Disease State - Hypertension Call  ?  ? ? ?Recent office visits:  ?01/23/22 Annye Asa, MD - Family Medicine (Video Visit) - Acute cough - predniSONE (DELTASONE) 10 MG tablet and promethazine-dextromethorphan (PROMETHAZINE-DM) 6.25-15 MG/5ML syrup prescribed. Follow up if no improvement.  ? ?Recent consult visits:  ?None noted. ? ?Hospital visits:  ?None in previous 6 months ? ?Medications: ?Outpatient Encounter Medications as of 02/07/2022  ?Medication Sig  ? amLODipine (NORVASC) 10 MG tablet Take 1 tablet (10 mg total) by mouth daily.  ? Ascorbic Acid (VITAMIN C PO) Take by mouth.  ? aspirin 81 MG tablet Take 81 mg by mouth every evening.   ? furosemide (LASIX) 20 MG tablet Take 1 tablet (20 mg total) by mouth daily.  ? Galcanezumab-gnlm (EMGALITY) 120 MG/ML SOAJ Inject 120 mg into the skin every 30 (thirty) days.  ? ibuprofen (ADVIL) 800 MG tablet TAKE ONE TABLET BY MOUTH EVERY 8 HOURS AS NEEDED FOR HEADACHE  ? Magnesium 250 MG TABS Take 250 mg by mouth daily.  ? multivitamin-lutein (OCUVITE-LUTEIN) CAPS capsule Take 1 capsule by mouth daily.  ? omeprazole (PRILOSEC) 20 MG capsule Take 1 capsule (20 mg total) by mouth daily.  ? predniSONE (DELTASONE) 10 MG tablet 3 tabs x3 days and then 2 tabs x3 days and then 1 tab x3 days.  Take w/ food.  ? promethazine-dextromethorphan (PROMETHAZINE-DM) 6.25-15 MG/5ML syrup Take 5 mLs by mouth 4 (four) times daily as needed.  ? Rimegepant Sulfate (NURTEC) 75 MG TBDP Take 75 mg by mouth daily as needed (take for abortive therapy of migraine, no more than 1 tablet in 24 hours or 8 per month).  ? vitamin B-12 (CYANOCOBALAMIN) 100 MCG tablet Take 100 mcg by mouth daily.  ? ?No facility-administered encounter medications on file as of 02/07/2022.  ? ?Current antihypertensive regimen:  ?Amlodipine '10mg'$  1 tablet daily  ?  ?How often are you checking  your Blood Pressure?  ?Patient reported she is checking her blood pressure every few days.  ?  ?  ?Current home BP readings: 141/71 (2 days ago) ?  ?  ?What recent interventions/DTPs have been made by any provider to improve Blood Pressure control since last CPP Visit:  ?Patient reported being changed to Amlodipine 10 mg as she felt the combination was causing her to have a dry cough.  ?  ?  ?Any recent hospitalizations or ED visits since last visit with CPP?  ?Patient has not had any hospitalizations or ED visits since last visit with CPP ?  ?  ?What diet changes have been made to improve Blood Pressure Control?  ?Patient is working on her salt intake daily.  ?  ?  ?What exercise is being done to improve your Blood Pressure Control?  ?Patient she is active and on her feet daily as she has been helping take care of her husband. She reported she was taking him for an appt in the office today at 3 pm. ?  ?  ?  ?Adherence Review: ?Is the patient currently on ACE/ARB medication? Yes ?Does the patient have >5 day gap between last estimated fill dates? No ?  ?  ?  ?Care Gaps ?  ?AWV: done 07/22/21 ?Colonoscopy: done 11/17/02 ?DM Eye Exam: N/A ?DM Foot Exam: N/A ?Microalbumin: N/A ?HbgAIC: N/A ?DEXA: unknown  ?Mammogram: done 04/26/21 ?  ?  ?Star Rating Drugs: ?  No Star Rating Drugs Noted.  ? ? ?Future Appointments  ?Date Time Provider [place]  ?02/08/2022  3:00 PM [facility] CARE [facility]  ?02/20/2022  1:00 PM Midge Minium, MD [facility]  ?08/02/2022  3:00 PM [facility] PHARMACIST [facility]  ? ? ?Liza Showfety, CCMA ?Clinical Pharmacist Assistant  ?[phone] ? ?

## 2022-02-08 ENCOUNTER — Ambulatory Visit (INDEPENDENT_AMBULATORY_CARE_PROVIDER_SITE_OTHER): Payer: Medicare Other

## 2022-02-08 DIAGNOSIS — G43109 Migraine with aura, not intractable, without status migrainosus: Secondary | ICD-10-CM

## 2022-02-08 DIAGNOSIS — I10 Essential (primary) hypertension: Secondary | ICD-10-CM

## 2022-02-08 DIAGNOSIS — M170 Bilateral primary osteoarthritis of knee: Secondary | ICD-10-CM

## 2022-02-08 NOTE — Chronic Care Management (AMB) (Signed)
?Chronic Care Management  ? ?CCM RN Visit Note ? ?02/08/2022 ?Name: Ann Conley MRN: [id number] DOB: 1934/07/23 ? ?Subjective: ?Ann Conley is a 88 y.o. year old female who is a primary care patient of Ann Conley, Ann Millet, MD. The care management team was consulted for assistance with disease management and care coordination needs.   ? ?Engaged with patient by telephone for follow up visit in response to provider referral for case management and/or care coordination services.  ? ?Consent to Services:  ?The patient was given information about Chronic Care Management services, agreed to services, and gave verbal consent prior to initiation of services.  Please see initial visit note for detailed documentation.  ? ?Patient agreed to services and verbal consent obtained.  ? ?Assessment: Review of patient past medical history, allergies, medications, health status, including review of consultants reports, laboratory and other test data, was performed as part of comprehensive evaluation and provision of chronic care management services.  ? ?SDOH (Social Determinants of Health) assessments and interventions performed:   ? ?CCM Care Plan ? ?Allergies  ?Allergen Reactions  ? Codeine Nausea And Vomiting  ? ? ?Outpatient Encounter Medications as of 02/08/2022  ?Medication Sig  ? amLODipine (NORVASC) 10 MG tablet Take 1 tablet (10 mg total) by mouth daily.  ? Ascorbic Acid (VITAMIN C PO) Take by mouth.  ? aspirin 81 MG tablet Take 81 mg by mouth every evening.   ? furosemide (LASIX) 20 MG tablet Take 1 tablet (20 mg total) by mouth daily.  ? Galcanezumab-gnlm (EMGALITY) 120 MG/ML SOAJ Inject 120 mg into the skin every 30 (thirty) days.  ? ibuprofen (ADVIL) 800 MG tablet TAKE ONE TABLET BY MOUTH EVERY 8 HOURS AS NEEDED FOR HEADACHE  ? Magnesium 250 MG TABS Take 250 mg by mouth daily.  ? multivitamin-lutein (OCUVITE-LUTEIN) CAPS capsule Take 1 capsule by mouth daily.  ? omeprazole (PRILOSEC) 20 MG capsule Take 1 capsule  (20 mg total) by mouth daily.  ? predniSONE (DELTASONE) 10 MG tablet 3 tabs x3 days and then 2 tabs x3 days and then 1 tab x3 days.  Take w/ food.  ? promethazine-dextromethorphan (PROMETHAZINE-DM) 6.25-15 MG/5ML syrup Take 5 mLs by mouth 4 (four) times daily as needed.  ? Rimegepant Sulfate (NURTEC) 75 MG TBDP Take 75 mg by mouth daily as needed (take for abortive therapy of migraine, no more than 1 tablet in 24 hours or 8 per month).  ? vitamin B-12 (CYANOCOBALAMIN) 100 MCG tablet Take 100 mcg by mouth daily.  ? ?No facility-administered encounter medications on file as of 02/08/2022.  ? ? ?Patient Active Problem List  ? Diagnosis Date Noted  ? ACE-inhibitor cough 01/03/2022  ? Primary stabbing headache 09/11/2018  ? Primary osteoarthritis of both knees 06/01/2017  ? Burning mouth syndrome 04/13/2016  ? Bleeding hemorrhoid 04/13/2016  ? Complicated migraine 37/90/2409  ? Keratoma 10/19/2014  ? Pain in lower limb 10/19/2014  ? Hammer toe of left foot 10/19/2014  ? Morbid obesity ([place]) 09/17/2014  ? Snoring 03/13/2013  ? Left anterior knee pain 03/13/2013  ? Mole (skin) 09/18/2011  ? Migraine with aura 05/07/2011  ? Essential hypertension 04/25/2011  ? GERD (gastroesophageal reflux disease) 04/25/2011  ? Annual physical exam 04/25/2011  ? BREAST CANCER, HX OF 06/21/2007  ? ? ?Conditions to be addressed/monitored:HTN, Osteoarthritis, and Migraines ? ?Care Plan : RN Care Manager Plan of Care  ?Updates made by Dimitri Ped, RN since 02/08/2022 12:00 AM  ?  ? ?Problem: Chronic Disease  Management and Care Coordination Needs (HTN, Migraine, osteoarthritis)   ?Priority: High  ?  ? ?Long-Range Goal: Establish Plan of Care for Chronic Disease Management Needs (HTN, Migraine, osteoarthritis)   ?Start Date: 09/21/2021  ?Expected End Date: 02/08/2023  ?Priority: High  ?Note:   ?Current Barriers:  ?Chronic Disease Management support and education needs related to HTN, Osteoarthritis, and Migraines ?States that her cough is  starting to get better since she is taking the prednisone and cough syrup at night.  States her B/P is better since she started taking the amlodipine.  States her B/P was 141/71 today. States that her migraine's are continuing to be  better since she started taking the Emgality injection. States she is still waiting to see if she was approved for patient assistance for the [employer]. States she does take Excedrin and Motrin as neurology told her to take as needed.  States her lt knee may need to be replaced and she is still  working weight loss so she can have a knee replacement.  States she has been working on weight loss by eating less and walking for exercise so that she can have surgery if needed.   ?RNCM Clinical Goal(s):  ?Patient will verbalize understanding of plan for management of HTN, Osteoarthritis, and Migraines as evidenced by voiced adherence to plan of care ?verbalize basic understanding of  HTN, Osteoarthritis, and Migraines disease process and self health management plan as evidenced by voiced understanding and teach back ?take all medications exactly as prescribed and will call provider for medication related questions as evidenced by dispense report and pt verbalization ?attend all scheduled medical appointments: Dr. Birdie Conley 02/20/22 CCM PharmD 08/02/22 as evidenced by medical records ?demonstrate Improved adherence to prescribed treatment plan for HTN, Osteoarthritis, and Migraines as evidenced by reading within limits, reports of improved pain management, voices adherence to plan of care ?continue to work with RN Care Manager to address care management and care coordination needs related to  HTN, Osteoarthritis, and Migraines as evidenced by adherence to CM Team Scheduled appointments ?work with pharmacist to address Financial constraints related to cost of medications related toHTN, Osteoarthritis, and Migraines as evidenced by review or EMR and patient or pharmacist report through collaboration  with Consulting civil engineer, provider, and care team.  ? ?Interventions: ?1:1 collaboration with primary care provider regarding development and update of comprehensive plan of care as evidenced by provider attestation and co-signature ?Inter-disciplinary care team collaboration (see longitudinal plan of care) ?Evaluation of current treatment plan related to  self management and patient's adherence to plan as established by provider ? ? ?Hypertension Interventions:  (Status:  Goal on track:  NO.) Long Term Goal ?Last practice recorded BP readings:  ?BP Readings from Last 3 Encounters:  ?01/12/22 (!) 153/75  ?12/28/21 (!) 150/72  ?08/16/21 (!) 156/78  ?Most recent eGFR/CrCl: No results found for: EGFR  No components found for: CRCL ? ?Evaluation of current treatment plan related to hypertension self management and patient's adherence to plan as established by provider ?Provided education to patient re: stroke prevention, s/s of heart attack and stroke ?Reviewed medications with patient and discussed importance of compliance ?Counseled on the importance of exercise goals with target of 150 minutes per week ?Discussed plans with patient for ongoing care management follow up and provided patient with direct contact information for care management team ?Advised patient, providing education and rationale, to monitor blood pressure daily and record, calling PCP for findings outside established parameters ?Provided education on prescribed diet low  sodium DASH diet ?Reviewed to not stop her B/P medication unless ordered by doctor. Reinforced to try to check her B/P regularly and to check when her husband checks his B/P ? ?Pain/Migraine Interventions:  (Status:  Goal on track:  Yes.) Long Term Goal ?Pain assessment performed ?Medications reviewed ?Reviewed provider established plan for pain management ?Discussed importance of adherence to all scheduled medical appointments ?Counseled on the importance of reporting any/all new or  changed pain symptoms or management strategies to pain management provider ?Advised patient to report to care team affect of pain on daily activities ?Discussed use of relaxation techniques and/or diversional ac

## 2022-02-08 NOTE — Patient Instructions (Signed)
Visit Information ? ?Thank you for taking time to visit with me today. Please don't hesitate to contact me if I can be of assistance to you before our next scheduled telephone appointment. ? ?Following are the goals we discussed today:  ?Take all medications as prescribed ?Attend all scheduled provider appointments ?Call pharmacy for medication refills 3-7 days in advance of running out of medications ?Call provider office for new concerns or questions  ?practice acceptance of chronic pain,practice relaxation or meditation daily ?check blood pressure daily ?choose a place to take my blood pressure (home, clinic or office, retail store) ?keep a blood pressure log ?take blood pressure log to all doctor appointments ?take medications for blood pressure exactly as prescribed ?report new symptoms to your doctor ?eat more whole grains, fruits and vegetables, lean meats and healthy fats ?limit salt intake to '2300mg'$ /day ? ?Our next appointment is by telephone on 05/03/22 at 3 PM ? ?Please call the care guide team at [phone] if you need to cancel or reschedule your appointment.  ? ?If you are experiencing a Mental Health or [place] or need someone to talk to, please call the Suicide and Crisis Lifeline: 988 ?call the [place] National Suicide Prevention Lifeline: 437 038 5406 or TTY: 954-289-7138 TTY 517-471-7095) to talk to a trained counselor ?call 1-800-273-TALK (toll free, 24 hour hotline) ?go to [facility] [address] [place] [phone]) ?call 911  ? ?The patient verbalized understanding of instructions, educational materials, and care plan provided today and agreed to receive a mailed copy of patient instructions, educational materials, and care plan.  ? ?Peter Garter RN, BSN,CCM, CDE ?Care Management Coordinator ?[facility] ?[phone]   ?

## 2022-02-20 ENCOUNTER — Ambulatory Visit (INDEPENDENT_AMBULATORY_CARE_PROVIDER_SITE_OTHER): Payer: Medicare Other | Admitting: Family Medicine

## 2022-02-20 ENCOUNTER — Encounter: Payer: Self-pay | Admitting: Family Medicine

## 2022-02-20 VITALS — BP 130/60 | HR 84 | Temp 98.3°F | Resp 16 | Wt 211.4 lb

## 2022-02-20 DIAGNOSIS — I10 Essential (primary) hypertension: Secondary | ICD-10-CM | POA: Diagnosis not present

## 2022-02-20 NOTE — Progress Notes (Signed)
? ?  Subjective:  ? ? Patient ID: Ann Conley, female    DOB: 02/14/1934, 88 y.o.   MRN: [id number] ? ?HPI ?HTN- BP today is 130/60 which is much better than it was on 3/9 when it was 153/75.  At that time, we restarted her Amlodipine '10mg'$  but not the ACE component as pt states she was unable to sleep due to cough.  Pt reports the cough resolved after stopping the Benazepril.   ? ? ?Review of Systems ?For ROS see HPI  ?   ?Objective:  ? Physical Exam ?Vitals reviewed.  ?Constitutional:   ?   General: She is not in acute distress. ?   Appearance: Normal appearance. She is well-developed. She is obese. She is not ill-appearing.  ?HENT:  ?   Head: Normocephalic and atraumatic.  ?Eyes:  ?   Conjunctiva/sclera: Conjunctivae normal.  ?   Pupils: Pupils are equal, round, and reactive to light.  ?Neck:  ?   Thyroid: No thyromegaly.  ?Cardiovascular:  ?   Rate and Rhythm: Normal rate and regular rhythm.  ?   Pulses: Normal pulses.  ?   Heart sounds: Normal heart sounds. No murmur heard. ?Pulmonary:  ?   Effort: Pulmonary effort is normal. No respiratory distress.  ?   Breath sounds: Normal breath sounds.  ?Abdominal:  ?   General: There is no distension.  ?   Palpations: Abdomen is soft.  ?   Tenderness: There is no abdominal tenderness.  ?Musculoskeletal:  ?   Cervical back: Normal range of motion and neck supple.  ?   Right lower leg: No edema.  ?   Left lower leg: No edema.  ?Lymphadenopathy:  ?   Cervical: No cervical adenopathy.  ?Skin: ?   General: Skin is warm and dry.  ?Neurological:  ?   Mental Status: She is alert and oriented to person, place, and time.  ?Psychiatric:     ?   Behavior: Behavior normal.  ? ? ? ? ? ?   ?Assessment & Plan:  ? ? ?

## 2022-02-20 NOTE — Assessment & Plan Note (Signed)
Chronic problem.  Better control today since restarting Amlodipine '10mg'$  daily.  Currently asymptomatic.  No med changes at this time.  No need for repeat labs. ?

## 2022-02-20 NOTE — Patient Instructions (Signed)
Follow up as scheduled or as needed ?Continue the Amlodipine daily- your blood pressure looks great! ?Call with any questions or concerns ?Stay Safe!  Stay Healthy! ?Happy Spring!!! ?

## 2022-02-21 ENCOUNTER — Ambulatory Visit: Payer: Medicare Other | Admitting: Family Medicine

## 2022-02-21 ENCOUNTER — Encounter: Payer: Self-pay | Admitting: Family Medicine

## 2022-02-21 VITALS — BP 163/70 | HR 82 | Ht 60.0 in | Wt 209.5 lb

## 2022-02-21 DIAGNOSIS — I10 Essential (primary) hypertension: Secondary | ICD-10-CM | POA: Diagnosis not present

## 2022-02-21 DIAGNOSIS — G43109 Migraine with aura, not intractable, without status migrainosus: Secondary | ICD-10-CM

## 2022-02-21 NOTE — Patient Instructions (Addendum)
Below is our plan: ? ?We will continue Emgality every 30 days on the 28th of the month. Continue Nurtec as needed for abortive therapy of headache/aura. You can take Nurtec once daily as needed but only up to 8 times per month. Call me if you have any questions or concerns.  ? ?Please make sure to keep a close eye on your blood pressure at home If readings are greater than 140-150/80-90. Monitor BP if you are having the ice pick sensation. Follow up closely with Dr Birdie Riddle.  ? ?Please make sure you are staying well hydrated. I recommend 50-60 ounces daily. Well balanced diet and regular exercise encouraged. Consistent sleep schedule with 6-8 hours recommended.  ? ?Please continue follow up with care team as directed.  ? ?Follow up with me in March 2024.  ? ?You may receive a survey regarding today's visit. I encourage you to leave honest feed back as I do use this information to improve patient care. Thank you for seeing me today!  ? ? ?

## 2022-02-21 NOTE — Progress Notes (Signed)
? ? ?Chief Complaint  ?Patient presents with  ? Follow-up  ?  RM 2, alone. Last seen 01/12/22. Reports the week she is due for Emgality, starts having migraine auras. Takes nurtec and it is beneficial. Wondering if she can take Emgality early? Also has numb in arms around time she is due for injection. Usually lasts for about 5 min. Has two nurtec left at home.   ? ? ? ?HISTORY OF PRESENT ILLNESS: ? ?02/21/22 ALL:  ?Ann Conley returns to discuss some concerns she has about her migraines. She continues to feel migraines are much better managed on Emgality. She continues Emgality on the 28th of every month. She reports auras return about a week before next dose is due. Nurtec works well. She has intermittent numbess. Sometimes numbness goes away spontaneously. Nurtec abort aura symptoms. She has some ice pick headaches lasting 1-2 seconds in the evenings. Symptoms resolve spontaneously.  ? ?She continues to work closely with her PCP for BP management. She has recently restarted amlodipine. Could not tolerate ACE due to cough. She was seen yesterday and BP was much better (130/60, previously 153/75). Today BP is 163/70. She takes amlodipine in the evenings. She reports home readings are 353-614 systolic.   ? ?01/12/2022 ALL:  ?Ann Conley is a 88 y.o. female here today for follow up for migraines. She was last seen 07/2021 and advised to continue Nurtec QOD for prevention. She called 07/26/2021 reports cost of Nurtec was unaffordable. We suggested starting Emgality. Sample pens administered 08/02/2021 in the office and she verbalized willingness and comfort with continuing monthly at home. She called 09/01/2021 requesting another sample pen and for our office to help with administration. She came by the office 11/2021 and picked up Nurtec samples instead of Emgality sample. Conversation with RN 12/05/2021 reports she had returned to taking Nurtec QOD. I recommended she use Emgality for prevention and Nurtec for abortive  therapy due to cost. Sample pen injected in the office 12/06/2021. She was given sample pen to administer with directions printed on packaging. She called reporting intractable migraine 12/26/2021. She reported stopping amlodipine/benazepril due to cough and interrupted sleep. She had taken tramadol and ibuprofen but not used Nurtec. She reported being unaware she could use Nurtec with other medications. She was seen by PCP 12/28/2021 and BP med changed to amlodipine '10mg'$  daily.  ? ?Today, she reports she has been better since starting the Emgality. She came into the office to receive her Emgality injection 12/06/21 and was given the pen for her injection that was due on 01/03/22. She will be due for her next injection on 01/31/22. Her insurance authorized the [employer] in 09/23, but she states she never received a call. I encouraged the patient to reach out to the pharmacy to obtain this medication.  ? ? ?She has lost about 20 pounds in preparation for knee surgery. Labs followed with PCP. LDL 104. On asa '81mg'$ . Patient has been hesitant to start statin. She reports taking amlodipine as prescribed.  ? ? ?HISTORY (copied from previous note) ?07/14/21 ALL:  ?Ann Conley returns for follow up for worsening migraines. She was using Nurtec with some success but has not been as effective, recently. She has started taking Nurtec every other day for prevention and felt it was working fairly well until she had a migraine with aura on 8/31. She reports migraine would "come and go" for about 3 days. She did not increase Nurtec but continued every other day dosing. She also stopped propranolol  and valsartan. She is uncertain of when she topped these but thinks it was about 3-4 weeks ago. She was seen by PCP 6/23 and valsartan was listed as an active medication. She reports that leg swelling and muscle aches have been better since stopping these medications. She has been watching her diet and walking more and has lost 16 pounds.  ?   ?03/07/2021 ALL: ?She returns for follow up for complicated migraines. MRI/MRA unremarkable. Nurtec has been helpful for abortive therapy. BP has been much better with Dr Birdie Riddle. Ann Conley states it has been elevated some at home. She has had 2-3 headaches over the past month but reports they were not bad and aborted with Nurtec. She has not picked up prescription from pharmacy.  ?  ?She does report having a fall last week. She was walking through her doorway and her foot got caught on the entryway. She hit her head up on the wall in front of her. No other injuries. She reports that her forehead has been a little sore but she is doing well, otherwise. No changes in vision. No confusion. No worsening headaches.  ?  ?  ?01/27/2021 ALL:  ?She returns today with concerns of left arm numbness and left visual disturbance that started 1 day ago. While playing cards, last night. She felt that her left arm was going to sleep. Numbness seemed to wax and wane and would radiate to the left jaw. She describes a kaleidoscope disturbance of the left eye last night around the same time. Visual disturbance lasted about 10 minutes. Numbness of arm is still present, today. It does not seem to make a difference of what position arm is in. Numbness seems to wax and wane. She is constantly sensitive to light. She wears sun glasses all the time, even at home. She denies head pain. She has taken Excedrin that has not helped. ?  ?She denies chest pain, trouble breathing, palpitations, sweating, abdominal or back pain, fever, chills, body aches.  ?  ?She has been working closely with PCP for uncontrolled HTN. Started on valsartan last week. She continues propranolol LA '60mg'$  daily (for migraines) and amlodipine '10mg'$  daily. She is unsure if it has made much difference. BP this morning was 200/86. Her head was not hurting when she took her blood pressure. Usual home readings have been around 160/80's. ?  ?Triptans have been avoided d/t history of  suspected TIA in 2019 with sudden onset of left arm weakness and facial numbness. Symptoms resolved in about an hour. Imaging unremarkable. She also has history of complicated migraine and migraine with aura. She is usually able to abort headache with Excedrin or ibuprofen. She is concerned that arm numbness has lingered for this long as it usually resolves within 12 hours.  ?  ?  ?12/16/2020 ALL:  ?She returns for follow up for complicated migraines. At last visit, we encouraged her to increase propranolol. She reports taking '20mg'$  BID. She does feel that migraines are not as painful. She continues to have a visual aura with some sort of headache 1-3 times a month. PCP gave her '800mg'$  ibuprofen that seems to help. She reports taking 1-2 doses of ibuprofen.  ?  ?She continues close follow up with PCP for blood pressure management on amlodipine '10mg'$  daily. She does check on occasion at home but does not remember what readings are. She reports that readings are usually "out of the ballpark". When I ask if today's reading is high for her she reports  that it has been this high at home.  ?  ?  ?09/07/2020 ALL:  ?Ann Conley is a 88 y.o. female here today for follow up for complicated migraines. She has history of partial vision loss with left arm numbness associated with headache. She was last seen 07/14/2020 by Dr Leta Baptist and advised to increase propranolol to '20mg'$  BID. She has not increased dose. She has had two migraines since being seen. She was evaluated by the ER on 10/6. CT brain normal. She reports symptoms resolved spontaneously without abortive therapy. She usually takes Excedrin but states that she rarely needs to take it. She reports BP is "always high". Reports it was 144/63 at home yesterday.  ? ?REVIEW OF SYSTEMS: Out of a complete 14 system review of symptoms, the patient complains only of the following symptoms, migraines, knee pain and all other reviewed systems are  negative. ? ? ?ALLERGIES: ?Allergies  ?Allergen Reactions  ? Codeine Nausea And Vomiting  ? ? ? ?HOME MEDICATIONS: ?Outpatient Medications Prior to Visit  ?Medication Sig Dispense Refill  ? amLODipine (NORVASC) 10 MG tablet Take 1 tablet (

## 2022-02-23 ENCOUNTER — Telehealth: Payer: Self-pay | Admitting: *Deleted

## 2022-02-23 NOTE — Telephone Encounter (Signed)
PA approved effective from 02/23/2022 through 02/24/2023. ? ?

## 2022-02-23 NOTE — Telephone Encounter (Signed)
Submitted PA Nurtec on CMM. Key: [id number]. Waiting on determination from [place] [employer] Part [place]. ?

## 2022-03-07 ENCOUNTER — Telehealth: Payer: Self-pay | Admitting: Pharmacist

## 2022-03-07 NOTE — Progress Notes (Signed)
Chronic Care Management Pharmacy Assistant   Name: TALEYAH HILLMAN  MRN: [id number] DOB: [birth year]   Reason for Encounter: Disease State - General Adherence Call      Recent office visits:  02/20/22 Annye Asa, MD - Hypertension - No medication changes. Follow up as scheduled.   Recent consult visits:  02/21/22 Debbora Presto NP - Neurology - Migraine - No medication changes. Follow upin 1 year. Sooner if needed.   Hospital visits:  None in previous 6 months  Medications: Outpatient Encounter Medications as of 03/07/2022  Medication Sig   amLODipine (NORVASC) 10 MG tablet Take 1 tablet (10 mg total) by mouth daily.   Ascorbic Acid (VITAMIN C PO) Take by mouth.   aspirin 81 MG tablet Take 81 mg by mouth every evening.    Galcanezumab-gnlm (EMGALITY) 120 MG/ML SOAJ Inject 120 mg into the skin every 30 (thirty) days.   ibuprofen (ADVIL) 800 MG tablet TAKE ONE TABLET BY MOUTH EVERY 8 HOURS AS NEEDED FOR HEADACHE   Magnesium 250 MG TABS Take 250 mg by mouth daily.   multivitamin-lutein (OCUVITE-LUTEIN) CAPS capsule Take 1 capsule by mouth daily.   omeprazole (PRILOSEC) 20 MG capsule Take 1 capsule (20 mg total) by mouth daily.   Rimegepant Sulfate (NURTEC) 75 MG TBDP Take 75 mg by mouth daily as needed (take for abortive therapy of migraine, no more than 1 tablet in 24 hours or 8 per month).   vitamin B-12 (CYANOCOBALAMIN) 100 MCG tablet Take 100 mcg by mouth daily.   No facility-administered encounter medications on file as of 03/07/2022.    Contacted [name] for General Review Call   Chart Review:  Have there been any documented new, changed, or discontinued medications since last visit? No (If yes, include name, dose, frequency, date) Has there been any documented recent hospitalizations or ED visits since last visit with Clinical Pharmacist? No Brief Summary (including medication and/or Diagnosis changes):   Adherence Review:  Does the Clinical Pharmacist  Assistant have access to adherence rates? Yes Adherence rates for STAR metric medications (List medication(s)/day supply/ last 2 fill dates). Adherence rates for medications indicated for disease state being reviewed (List medication(s)/day supply/ last 2 fill dates). Does the patient have >5 day gap between last estimated fill dates for any of the above medications or other medication gaps? No Reason for medication gaps.   Disease State Questions:  Able to connect with Patient? Yes Did patient have any problems with their health recently? No Note problems and Concerns: Have you had any admissions or emergency room visits or worsening of your condition(s) since last visit? No Details of ED visit, hospital visit and/or worsening condition(s): Have you had any visits with new specialists or providers since your last visit? No Explain: Have you had any new health care problem(s) since your last visit? No New problem(s) reported: Have you run out of any of your medications since you last spoke with clinical pharmacist? No What caused you to run out of your medications? Are there any medications you are not taking as prescribed? No What kept you from taking your medications as prescribed? Are you having any issues or side effects with your medications? No Note of issues or side effects: Do you have any other health concerns or questions you want to discuss with your Clinical Pharmacist before your next visit? No Note additional concerns and questions from Patient. Are there any health concerns that you feel we can do a better job  addressing? No Note Patient's response. Are you having any problems with any of the following since the last visit: (select all that apply)  None  Details: 12. Any falls since last visit? No  Details: 13. Any increased or uncontrolled pain since last visit? No  Details: 14. Next visit Type: office       Visit with: PCP        Date: 05/03/22         Time:  3:00pm  15. Additional Details? No     Care Gaps   AWV: done 07/22/21 Colonoscopy: done 11/17/02 DM Eye Exam: N/A DM Foot Exam: N/A Microalbumin: N/A HbgAIC: N/A DEXA: unknown  Mammogram: done 04/26/21 has been ordered   Star Rating Drugs: No star rating drugs noted.    Future Appointments  Date Time Provider [place]  05/03/2022  3:00 PM [facility] CARE [facility]  08/02/2022  3:00 PM [facility] PHARMACIST [facility]  01/23/2023  2:30 PM Lomax, Amy, NP [facility] None    Jobe Gibbon, [place] Clinical Pharmacist Assistant  [phone]

## 2022-03-14 ENCOUNTER — Other Ambulatory Visit: Payer: Self-pay

## 2022-03-14 DIAGNOSIS — K219 Gastro-esophageal reflux disease without esophagitis: Secondary | ICD-10-CM

## 2022-03-14 MED ORDER — OMEPRAZOLE 20 MG PO CPDR: 20.0000 mg | DELAYED_RELEASE_CAPSULE | Freq: Every day | ORAL | 1 refills | Status: DC

## 2022-03-27 ENCOUNTER — Telehealth: Payer: Self-pay | Admitting: Family Medicine

## 2022-03-27 NOTE — Telephone Encounter (Signed)
Pt is asking for a call to discuss how every time is a few days away from time to take her Galcanezumab-gnlm (EMGALITY) 120 MG/ML SOAJ, she has numbness on the left side of her mouth. Pt states she has been to th ED multiple times about that, and is not overly concerned to the point of going again because this is 1 of the sx with the Emgality.  Pt wants  a call to discuss sx with that medication and, to discuss how she is still using Nurtec for relief, please call.

## 2022-03-27 NOTE — Telephone Encounter (Signed)
Called and spoke with pt. Relayed Amy's message. She verbalized understanding and appreciation. She will call if she develops any new or worsening sx moving forward.  She did update me that she also takes tramadol '50mg'$  po q am prescribed by orthopaedics for knee pain. She can take q 6hr prn but only takes once daily. I added to med list.

## 2022-03-27 NOTE — Telephone Encounter (Signed)
Called pt back. She reports this past weekend on Friday, started having light numbness on left side of mouth that is intermittent. No dropping of face/no numbness in arms. No slurred speech. No confusion. However, this is a new sx for her. Did not go to ER, did not feel there was anything they could do. Denies any falls or injury prior to sx starting. No sickness/infection right now. Now takes amlodipine in the morning and BP more controlled. Took nurtec for numbness. Ineffective. Has 3 nurtec left at home. Did educate pt that she should reserve for migraines, not numbness. Has not started any new meds recently. 1-2 days before emgality injections are due, she also gets nauseous and throws up. Denies having a migraine prior to this. May have slight headache.  Last [facility] 03/03/2022. Due next 04/02/22.   She wanted to report sx since it was new and get Amy's thoughts on this and if she needs to do anything different right now. Aware I will send to her to review and will call back.

## 2022-04-11 ENCOUNTER — Other Ambulatory Visit: Payer: Self-pay | Admitting: Registered Nurse

## 2022-04-11 DIAGNOSIS — R110 Nausea: Secondary | ICD-10-CM

## 2022-04-11 MED ORDER — ONDANSETRON 4 MG PO TBDP: 4.0000 mg | ORAL_TABLET | Freq: Every day | ORAL | 0 refills | Status: DC | PRN

## 2022-04-12 ENCOUNTER — Telehealth: Payer: Self-pay | Admitting: *Deleted

## 2022-04-12 NOTE — Telephone Encounter (Signed)
Called pt per AL,NP request. She reported to Dr. Orland Mustard that she felt she was having nausea secondary to Emgality/nurtec. She last took [facility] 04/02/22 and nurtec 04/01/22. She typically gets nausea intermittently over the last three months.  Most recent episode of nausea started 04/09/22 and lasted three days. She feels nausea resolved today.  She is agreeable to trying zofran to taking only prn. Aware to use very sparingly. After reviewing chart, relayed that Dr. Orland Mustard calling in zofran '4mg'$  yesterday to [facility]. She will pick this up to try whenever she has another episode of nausea. If beneficial, she will let us know.

## 2022-05-02 ENCOUNTER — Telehealth: Payer: Self-pay | Admitting: Family Medicine

## 2022-05-02 NOTE — Telephone Encounter (Signed)
Pt states taking the Galcanezumab-gnlm (EMGALITY) 120 MG/ML SOAJ and (NURTEC) taken together makes her a little sick.  Pt has not picked up the [facility] to take tomorrow because it cost around $170.00 pt would like to know if it would be ok to skip the [facility] this week and see how she feels? Please call pt.

## 2022-05-02 NOTE — Telephone Encounter (Addendum)
Called the patient and advised that Amy would not recommend skipping her medication given her history of migraines and how far we have come to get to this point where they are being managed. We would encourage that she continue to take the medication as prescribed.  Pt has not had to use the zofran she has been making sure she is eating properly.  Pt was appreciative for the call back and will let us know if she needs [place]

## 2022-05-03 ENCOUNTER — Ambulatory Visit (INDEPENDENT_AMBULATORY_CARE_PROVIDER_SITE_OTHER): Payer: Medicare Other

## 2022-05-03 DIAGNOSIS — M170 Bilateral primary osteoarthritis of knee: Secondary | ICD-10-CM

## 2022-05-03 DIAGNOSIS — G43109 Migraine with aura, not intractable, without status migrainosus: Secondary | ICD-10-CM

## 2022-05-03 DIAGNOSIS — I10 Essential (primary) hypertension: Secondary | ICD-10-CM

## 2022-05-03 NOTE — Patient Instructions (Signed)
Visit Information RNCM case closed goals met Thank you for allowing me to share the care management and care coordination services that are available to you as part of your health plan and services through your primary care provider and medical home. Please reach out to me at [phone] if the care management/care coordination team may be of assistance to you in the future.  Rye Dorado RN, BSN,CCM, CDE Care Management Coordinator [facility] [phone]   

## 2022-05-03 NOTE — Chronic Care Management (AMB) (Signed)
Chronic Care Management   CCM RN Visit Note  05/03/2022 Name: Ann Conley MRN: [id number] DOB: [birth year]  Subjective: Ann Conley is a 88 y.o. year old female who is a primary care patient of Birdie Riddle, Aundra Millet, MD. The care management team was consulted for assistance with disease management and care coordination needs.    Engaged with patient by telephone for follow up visit in response to provider referral for case management and/or care coordination services.   Consent to Services:  The patient was given information about Chronic Care Management services, agreed to services, and gave verbal consent prior to initiation of services.  Please see initial visit note for detailed documentation.   Patient agreed to services and verbal consent obtained.   Assessment: Review of patient past medical history, allergies, medications, health status, including review of consultants reports, laboratory and other test data, was performed as part of comprehensive evaluation and provision of chronic care management services.   SDOH (Social Determinants of Health) assessments and interventions performed:    CCM Care Plan  Allergies  Allergen Reactions   Codeine Nausea And Vomiting    Outpatient Encounter Medications as of 05/03/2022  Medication Sig   amLODipine (NORVASC) 10 MG tablet Take 1 tablet (10 mg total) by mouth daily.   Ascorbic Acid (VITAMIN C PO) Take by mouth.   aspirin 81 MG tablet Take 81 mg by mouth every evening.    Galcanezumab-gnlm (EMGALITY) 120 MG/ML SOAJ Inject 120 mg into the skin every 30 (thirty) days.   ibuprofen (ADVIL) 800 MG tablet TAKE ONE TABLET BY MOUTH EVERY 8 HOURS AS NEEDED FOR HEADACHE   Magnesium 250 MG TABS Take 250 mg by mouth daily.   multivitamin-lutein (OCUVITE-LUTEIN) CAPS capsule Take 1 capsule by mouth daily.   omeprazole (PRILOSEC) 20 MG capsule Take 1 capsule (20 mg total) by mouth daily.   ondansetron (ZOFRAN-ODT) 4 MG disintegrating  tablet Take 1 tablet (4 mg total) by mouth daily as needed for nausea or vomiting.   Rimegepant Sulfate (NURTEC) 75 MG TBDP Take 75 mg by mouth daily as needed (take for abortive therapy of migraine, no more than 1 tablet in 24 hours or 8 per month).   traMADol (ULTRAM) 50 MG tablet Take 50 mg by mouth every 6 (six) hours as needed.   vitamin B-12 (CYANOCOBALAMIN) 100 MCG tablet Take 100 mcg by mouth daily.   No facility-administered encounter medications on file as of 05/03/2022.    Patient Active Problem List   Diagnosis Date Noted   ACE-inhibitor cough 01/03/2022   Primary stabbing headache 09/11/2018   Primary osteoarthritis of both knees 06/01/2017   Burning mouth syndrome 04/13/2016   Bleeding hemorrhoid 66/29/4765   Complicated migraine 46/50/3546   Keratoma 10/19/2014   Pain in lower limb 10/19/2014   Hammer toe of left foot 10/19/2014   Morbid obesity ([place]) 09/17/2014   Snoring 03/13/2013   Left anterior knee pain 03/13/2013   Mole (skin) 09/18/2011   Migraine with aura 05/07/2011   Essential hypertension 04/25/2011   GERD (gastroesophageal reflux disease) 04/25/2011   Annual physical exam 04/25/2011   BREAST CANCER, HX OF 06/21/2007    Conditions to be addressed/monitored:HTN, Osteoarthritis, and Migraine  Care Plan : RN Care Manager Plan of Care  Updates made by Dimitri Ped, RN since 05/03/2022 12:00 AM  Completed 05/03/2022   Problem: Chronic Disease Management and Care Coordination Needs (HTN, Migraine, osteoarthritis) Resolved 05/03/2022  Priority: High     Long-Range Goal:  Establish Plan of Care for Chronic Disease Management Needs (HTN, Migraine, osteoarthritis) Completed 05/03/2022  Start Date: 09/21/2021  Expected End Date: 02/08/2023  Priority: High  Note:   RNCM case closed goals met Current Barriers:  Chronic Disease Management support and education needs related to HTN, Osteoarthritis, and Migraines  States her B/P is better since she started  taking the amlodipine.  States that her migraine's are continuing to be  better since she started taking the Emgality injection. States the [place] is expensive and she is not sure she can keep getting it filled. States she is still waiting to see if she was approved for patient assistance for the [employer]. States she does take Excedrin and Motrin as neurology told her to take as needed.  States her lt knee may need to be replaced and she is still  working weight loss so she can have a knee replacement.  States she has been working on weight loss by eating less and walking for exercise so that she can have surgery if needed.  States she and her husband are moving soon into a retirement home at [employer]. States it is only 15 minutes away from their daughter in [place]. RNCM Clinical Goal(s):  Patient will verbalize understanding of plan for management of HTN, Osteoarthritis, and Migraines as evidenced by voiced adherence to plan of care verbalize basic understanding of  HTN, Osteoarthritis, and Migraines disease process and self health management plan as evidenced by voiced understanding and teach back take all medications exactly as prescribed and will call provider for medication related questions as evidenced by dispense report and pt verbalization attend all scheduled medical appointments: Dr. Birdie Riddle 02/20/22 CCM PharmD 08/02/22 as evidenced by medical records demonstrate Improved adherence to prescribed treatment plan for HTN, Osteoarthritis, and Migraines as evidenced by reading within limits, reports of improved pain management, voices adherence to plan of care continue to work with RN Care Manager to address care management and care coordination needs related to  HTN, Osteoarthritis, and Migraines as evidenced by adherence to CM Team Scheduled appointments work with pharmacist to address Financial constraints related to cost of medications related toHTN, Osteoarthritis, and Migraines as evidenced by  review or EMR and patient or pharmacist report through collaboration with Consulting civil engineer, provider, and care team.   Interventions: 1:1 collaboration with primary care provider regarding development and update of comprehensive plan of care as evidenced by provider attestation and co-signature Inter-disciplinary care team collaboration (see longitudinal plan of care) Evaluation of current treatment plan related to  self management and patient's adherence to plan as established by provider Communicated to PharmD concerns about cost of Emgality   Hypertension Interventions:  (Status:  Goal Met.) Long Term Goal Last practice recorded BP readings:  BP Readings from Last 3 Encounters:  02/21/22 (!) 163/70  02/20/22 130/60  01/12/22 (!) 153/75  Most recent eGFR/CrCl: No results found for: EGFR  No components found for: CRCL  Evaluation of current treatment plan related to hypertension self management and patient's adherence to plan as established by provider Provided education to patient re: stroke prevention, s/s of heart attack and stroke Reviewed medications with patient and discussed importance of compliance Counseled on the importance of exercise goals with target of 150 minutes per week Discussed plans with patient for ongoing care management follow up and provided patient with direct contact information for care management team Advised patient, providing education and rationale, to monitor blood pressure daily and record, calling PCP for findings outside established  parameters Provided education on prescribed diet low sodium DASH diet Reviewed to not stop her B/P medication unless ordered by doctor. Reinforced to try to check her B/P regularly and to check when her husband checks his B/P  Pain/Migraine Interventions:  (Status:  Goal Met.) Long Term Goal Pain assessment performed Medications reviewed Reviewed provider established plan for pain management Discussed importance of  adherence to all scheduled medical appointments Counseled on the importance of reporting any/all new or changed pain symptoms or management strategies to pain management provider Advised patient to report to care team affect of pain on daily activities Discussed use of relaxation techniques and/or diversional activities to assist with pain reduction (distraction, imagery, relaxation, massage, acupressure, TENS, heat, and cold application Reviewed with patient prescribed pharmacological and nonpharmacological pain relief strategies Reviewed to contact neurology to make follow up appointment     Patient Goals/Self-Care Activities: Take all medications as prescribed Attend all scheduled provider appointments Call pharmacy for medication refills 3-7 days in advance of running out of medications Call provider office for new concerns or questions  practice acceptance of chronic pain,practice relaxation or meditation daily check blood pressure daily choose a place to take my blood pressure (home, clinic or office, retail store) keep a blood pressure log take blood pressure log to all doctor appointments take medications for blood pressure exactly as prescribed report new symptoms to your doctor eat more whole grains, fruits and vegetables, lean meats and healthy fats limit salt intake to 2326m/day  Follow Up Plan:  The patient has been provided with contact information for the care management team and has been advised to call with any health related questions or concerns.  No further follow up required: RNCM case closed goals met         Plan:The patient has been provided with contact information for the care management team and has been advised to call with any health related questions or concerns.  No further follow up required: RNCM case closed goals met MPeter GarterRN, BTog[facility] CDE Care Management Coordinator [facility] ([phone]

## 2022-05-04 ENCOUNTER — Telehealth: Payer: Self-pay | Admitting: Pharmacist

## 2022-05-04 NOTE — Progress Notes (Signed)
    Chronic Care Management Pharmacy Assistant    Name: AMOREE NEWLON  MRN: [id number] DOB: August 18, 1934   Reason for Encounter: Emgality PAP    PAP form initiated for [employer]. Will be mailed to patient to complete patient portion and return to prescribing MD office for final portion to be completed by MD and faxed in for patient for processing. Patient will update on the outcome of their application once received.   Jobe Gibbon, [facility] Clinical Pharmacist Assistant  [phone]

## 2022-05-05 DIAGNOSIS — I10 Essential (primary) hypertension: Secondary | ICD-10-CM

## 2022-05-05 DIAGNOSIS — M170 Bilateral primary osteoarthritis of knee: Secondary | ICD-10-CM

## 2022-05-26 ENCOUNTER — Other Ambulatory Visit: Payer: Self-pay

## 2022-05-26 DIAGNOSIS — G43109 Migraine with aura, not intractable, without status migrainosus: Secondary | ICD-10-CM

## 2022-05-26 MED ORDER — IBUPROFEN 800 MG PO TABS: ORAL_TABLET | ORAL | 0 refills | Status: DC

## 2022-06-26 ENCOUNTER — Telehealth: Payer: Self-pay | Admitting: Family Medicine

## 2022-06-26 NOTE — Telephone Encounter (Signed)
Caller name: Ann Conley   On DPR? :yes/no: Yes  Call back number: [phone]  Provider they see: Birdie Riddle   Reason for call: Pt called asking should her and her husband Ann Conley) get their shingles shot. Pt is 89 and her husband is 62.

## 2022-06-28 NOTE — Telephone Encounter (Signed)
Called pt. Pt is now aware.

## 2022-06-30 ENCOUNTER — Other Ambulatory Visit: Payer: Self-pay

## 2022-06-30 DIAGNOSIS — I10 Essential (primary) hypertension: Secondary | ICD-10-CM

## 2022-06-30 MED ORDER — AMLODIPINE BESYLATE 10 MG PO TABS
10.0000 mg | ORAL_TABLET | Freq: Every day | ORAL | 3 refills | Status: DC
  Filled 2023-04-03: qty 90, 90d supply, fill #0

## 2022-07-26 NOTE — Progress Notes (Signed)
Chronic Care Management Pharmacy Note  08/02/2022 Name:  Ann Conley MRN:  [id number] DOB:  1934/03/19  Summary: PharmD FU visit.  She reports BP is controlled at home - [phone] systolic 56-25 diastolic.  No dizziness.  Now only on amlodipine after benazepril caused dry cough  Recommendations/Changes made from today's visit: Follow BP closely - asked her to monitor.   Plan: CMA 3 months BP PharmD 6 months   Subjective: Ann Conley is an 88 y.o. year old female who is a primary patient of [name], Aundra Millet, MD.  The CCM team was consulted for assistance with disease management and care coordination needs.    Engaged with patient face to face for follow up visit in response to provider referral for pharmacy case management and/or care coordination services.   Consent to Services:  The patient was given the following information about Chronic Care Management services today, agreed to services, and gave verbal consent: 1. CCM service includes personalized support from designated clinical staff supervised by the primary care provider, including individualized plan of care and coordination with other care providers 2. 24/7 contact phone numbers for assistance for urgent and routine care needs. 3. Service will only be billed when office clinical staff spend 20 minutes or more in a month to coordinate care. 4. Only one practitioner may furnish and bill the service in a calendar month. 5.The patient may stop CCM services at any time (effective at the end of the month) by phone call to the office staff. 6. The patient will be responsible for cost sharing (co-pay) of up to 20% of the service fee (after annual deductible is met). Patient agreed to services and consent obtained.  Patient Care Team: Midge Minium, MD as PCP - General (Family Medicine) Lorretta Harp, MD as Consulting Physician (Cardiology) Penni Bombard, MD as Consulting Physician (Neurology) Calvert Cantor, MD as Consulting Physician (Ophthalmology) Edythe Clarity, [facility] (Pharmacist)  Recent office visits:  08/30/21 Annye Asa, MD (PCP) - Family Medicine (Nurse visit) - Hypertension - Blood pressure check. No medication changes. Follow up as scheduled.    08/16/21 Maximiano Coss, NP - Family Medicine - Hypertension - amLODipine-benazepril (LOTREL) 10-20 MG capsule prescribed. Stop amlodipine. Start amlodipine-benazepril. Return for bp check in 1-2 week, nurse visit.   04/28/21 Annye Asa, MD (PCP) - Family Medicine - Bilateral Swelling of feet - Furosemide (LASIX) 20 MG tablet prescribed prn. Follow up as scheduled.    03/17/21 Areatha Keas, MD (PCP) - Family Medicine - Annual Physical Exam - Labs were ordered. Mammogram ordered. No medication changes. Follow up in 6 months.      Recent consult visits:  07/14/21 Debbora Presto, NP - Neurology - Migraine - recommended CGRP injection monthly for prevention but she wishes to hold off for now. She will continue Nurtec every other day. BP is a little elevated today but is much better than previous readings. I am concerned it may continue to increase as she has stopped propranolol and valsartan. She will continue close follow up with Dr Birdie Riddle. Follow up in 6 months.   Hospital visits:  None in previous 6 months   Objective:  Lab Results  Component Value Date   CREATININE 0.51 12/28/2021   BUN 13 12/28/2021   GFR 85.25 12/28/2021   GFRNONAA >60 01/27/2021   GFRAA >60 08/10/2018   NA 133 (L) 12/28/2021   K 3.9 12/28/2021   CALCIUM 9.1 12/28/2021   CO2 30 12/28/2021  GLUCOSE 121 (H) 12/28/2021    Lab Results  Component Value Date/Time   HGBA1C 5.7 09/12/2019 11:36 AM   HGBA1C 5.6 09/11/2018 09:34 AM   GFR 85.25 12/28/2021 12:29 PM   GFR 83.81 03/17/2021 02:07 PM    Last diabetic Eye exam: No results found for: "HMDIABEYEEXA"  Last diabetic Foot exam: No results found for: "HMDIABFOOTEX"   Lab Results  Component  Value Date   CHOL 187 12/28/2021   HDL 66.00 12/28/2021   LDLCALC 104 (H) 12/28/2021   LDLDIRECT 125.0 09/09/2012   TRIG 82.0 12/28/2021   CHOLHDL 3 12/28/2021       Latest Ref Rng & Units 12/28/2021   12:29 PM 03/17/2021    2:07 PM 01/27/2021    5:57 PM  Hepatic Function  Total Protein 6.0 - 8.3 g/dL 6.9  7.1  7.5   Albumin 3.5 - 5.2 g/dL 4.2  4.2  3.9   AST 0 - 37 U/L 14  15  21   ALT 0 - 35 U/L 12  12  20   Alk Phosphatase 39 - 117 U/L 97  85  82   Total Bilirubin 0.2 - 1.2 mg/dL 0.5  0.4  0.3   Bilirubin, Direct 0.0 - 0.3 mg/dL 0.1  0.0      Lab Results  Component Value Date/Time   TSH 1.93 12/28/2021 12:29 PM   TSH 2.16 03/17/2021 02:07 PM       Latest Ref Rng & Units 12/28/2021   12:29 PM 03/17/2021    2:07 PM 01/27/2021    5:57 PM  CBC  WBC 4.0 - 10.5 K/uL 6.6  6.6  7.0   Hemoglobin 12.0 - 15.0 g/dL 12.8  12.5  13.1   Hematocrit 36.0 - 46.0 % 38.7  37.2  39.9   Platelets 150.0 - 400.0 K/uL 249.0  247.0  268     No results found for: "VD25OH"  Clinical ASCVD: No  The ASCVD Risk score (Arnett DK, et al., 2019) failed to calculate for the following reasons:   The 2019 ASCVD risk score is only valid for ages 40 to 79       12/28/2021   12:08 PM 08/16/2021    1:56 PM 07/20/2021   10:06 AM  Depression screen PHQ 2/9  Decreased Interest 0 0 0  Down, Depressed, Hopeless 0 0 0  PHQ - 2 Score 0 0 0  Altered sleeping 0    Tired, decreased energy 0    Change in appetite 0    Feeling bad or failure about yourself  0    Trouble concentrating 0    Moving slowly or fidgety/restless 0    Suicidal thoughts 0    PHQ-9 Score 0    Difficult doing work/chores Not difficult at all      Social History   Tobacco Use  Smoking Status Never  Smokeless Tobacco Never   BP Readings from Last 3 Encounters:  02/21/22 (!) 163/70  02/20/22 130/60  01/12/22 (!) 153/75   Pulse Readings from Last 3 Encounters:  02/21/22 82  02/20/22 84  01/12/22 87   Wt Readings from Last  3 Encounters:  02/21/22 209 lb 8 oz (95 kg)  02/20/22 211 lb 6.4 oz (95.9 kg)  01/12/22 211 lb (95.7 kg)   BMI Readings from Last 3 Encounters:  02/21/22 40.92 kg/m  02/20/22 41.29 kg/m  01/12/22 41.21 kg/m    Assessment/Interventions: Review of patient past medical history, allergies, medications, health status, including   review of consultants reports, laboratory and other test data, was performed as part of comprehensive evaluation and provision of chronic care management services.   SDOH:  (Social Determinants of Health) assessments and interventions performed: Yes SDOH Interventions    Flowsheet Row Chronic Care Management from 07/20/2021 in [facility]  SDOH Interventions   Food Insecurity Interventions Intervention Not Indicated  Housing Interventions Intervention Not Indicated  Transportation Interventions Intervention Not Indicated  Financial Strain Interventions Intervention Not Indicated  Stress Interventions Intervention Not Indicated      Financial Resource Strain: Low Risk  (07/20/2021)   Overall Financial Resource Strain (CARDIA)    Difficulty of Paying Living Expenses: Not hard at all    [place]: No Food Insecurity (08/02/2022)  Housing: Low Risk  (07/20/2021)  Transportation Needs: No Transportation Needs (07/20/2021)  Depression (PHQ2-9): Low Risk  (12/28/2021)  Financial Resource Strain: Low Risk  (07/20/2021)  Stress: No Stress Concern Present (07/20/2021)  Tobacco Use: Low Risk  (02/21/2022)    [place]  Allergies  Allergen Reactions   Codeine Nausea And Vomiting    Medications Reviewed Today     Reviewed by Edythe Clarity, [facility] (Pharmacist) on 08/02/22 at 1613  Med List Status: <None>   Medication Order Taking? Sig Documenting Provider Last Dose Status Informant  amLODipine (NORVASC) 10 MG tablet [id number] Yes Take 1 tablet (10 mg total) by mouth daily. Midge Minium,  MD Taking Active   Ascorbic Acid (VITAMIN C PO) [id number] Yes Take by mouth. [provider] Taking Active   aspirin 81 MG tablet [id number] Yes Take 81 mg by mouth every evening.  [provider] Taking Active Self  Galcanezumab-gnlm [facility]) 120 MG/ML Darden Palmer [id number] Yes Inject 120 mg into the skin every 30 (thirty) days. Lomax, Amy, NP Taking Active   ibuprofen (ADVIL) 800 MG tablet [id number] Yes TAKE ONE TABLET BY MOUTH EVERY 8 HOURS AS NEEDED FOR HEADACHE Midge Minium, MD Taking Active   Magnesium 250 MG TABS [id number] Yes Take 250 mg by mouth daily. [provider] Taking Active   multivitamin-lutein [facility]) CAPS capsule [id number] Yes Take 1 capsule by mouth daily. [provider] Taking Active Self  omeprazole (PRILOSEC) 20 MG capsule [id number] Yes Take 1 capsule (20 mg total) by mouth daily. Midge Minium, MD Taking Active   ondansetron (ZOFRAN-ODT) 4 MG disintegrating tablet [id number] Yes Take 1 tablet (4 mg total) by mouth daily as needed for nausea or vomiting. Maximiano Coss, NP Taking Active   Rimegepant Sulfate (NURTEC) 75 MG TBDP [id number] Yes Take 75 mg by mouth daily as needed (take for abortive therapy of migraine, no more than 1 tablet in 24 hours or 8 per month). Lomax, Amy, NP Taking Active   traMADol (ULTRAM) 50 MG tablet [id number] Yes Take 50 mg by mouth every 6 (six) hours as needed. [provider] Taking Active   vitamin B-12 (CYANOCOBALAMIN) 100 MCG tablet [id number] Yes Take 100 mcg by mouth daily. [provider] Taking Active Self            Patient Active Problem List   Diagnosis Date Noted   ACE-inhibitor cough 01/03/2022   Primary stabbing headache 09/11/2018   Primary osteoarthritis of both knees 06/01/2017   Burning mouth syndrome 04/13/2016   Bleeding hemorrhoid 41/58/3094   Complicated migraine 07/68/0881   Keratoma 10/19/2014   Pain in lower limb 10/19/2014   Hammer toe of  left foot 10/19/2014  Morbid obesity (HCC) 09/17/2014   Snoring 03/13/2013   Left anterior knee pain 03/13/2013   Mole (skin) 09/18/2011   Migraine with aura 05/07/2011   Essential hypertension 04/25/2011   GERD (gastroesophageal reflux disease) 04/25/2011   Annual physical exam 04/25/2011   BREAST CANCER, HX OF 06/21/2007    Immunization History  Administered Date(s) Administered   Moderna Sars-Covid-2 Vaccination 01/23/2020, 02/23/2020   Pneumococcal Conjugate-13 09/17/2014   Pneumococcal Polysaccharide-23 09/09/2012   Tdap 09/01/2016    Conditions to be addressed/monitored:  HTN, GERD, Osteoarthritis, Obesity  Care Plan : General Pharmacy (Adult)  Updates made by Davis, Christian L, RPH since 08/02/2022 12:00 AM     Problem: HTN, GERD, Osteoarthritis   Priority: High  Onset Date: 10/19/2021     Long-Range Goal: Patient-Specific Goal   Start Date: 10/19/2021  Expected End Date: 04/19/2022  Recent Progress: On track  Priority: High  Note:   Current Barriers:  Unable to independently afford treatment regimen Unable to achieve control of blood pressure   Pharmacist Clinical Goal(s):  Patient will verbalize ability to afford treatment regimen achieve improvement in BP as evidenced by home monitoring through collaboration with PharmD and provider.   Interventions: 1:1 collaboration with [name], Katherine E, MD regarding development and update of comprehensive plan of care as evidenced by provider attestation and co-signature Inter-disciplinary care team collaboration (see longitudinal plan of care) Comprehensive medication review performed; medication list updated in electronic medical record  Hypertension (BP goal <140/90) 08/02/22 -Controlled based on her home readings -Current treatment: Amlodipine 10mg Appropriate, Effective, Safe, Accessible -Medications previously tried: Benazepril (cough) -Current home readings: 130/140s systolic 70/80s systolic. -Denies  hypotensive/hypertensive symptoms -Educated on BP goals and benefits of medications for prevention of heart attack, stroke and kidney damage; Importance of home blood pressure monitoring; Proper BP monitoring technique; Symptoms of hypotension and importance of maintaining adequate hydration; -She is completely off of benazepril now due to cough, the cough has gone away since stopping the medication. Amlodipine alone seems to be controlling BP to goal.  Update 01/18/22 Has not  been checking her BP at home due to busy week. Recently stopped benazepril due to cough she was having.  She resumed amlodipine 10mg but has not been checking her BP at home.  Discussed importance of BP monitoring.  Patient agrees to start checking more and recording up to twice daily. Will FU in a week or so to see how BP is doing. No changes at this time.  Osteoarthritis (Goal: Minimize pain) -Controlled -Current treatment  IBU 800mg q8h prn -Medications previously tried: Tramadol -Patient mentions she often has pain from osteoarthritis.  She does not like to take a lot of medication.  She has requested a refill of Tramadol to be sent to [facility] pharmacy.  I believe this was last filled at an urgent care type clinic.  -Recommended to continue current medication  GERD (Goal: Minimize symptoms) -Controlled -Current treatment  Omeprazole 20mg - 10 year history of med  -Medications previously tried: none noted -Rarely uses this medication - denies any symptoms of acid reflux recently  -Recommended she continue to watch trigger foods.  No need to taper since she has not taken consistently. -I believe she would benefit from DEXA scan, she does not feel at her age she would want to do this.  Migraine w/ Aura (Goal: Reduce occurrence) 08/02/22 -Controlled -Current treatment  Emgality 120mg every 30 days Appropriate, Effective, Safe, Accessible Nurtec 75mg prn Appropriate, Effective, Safe,  Accessible -Medications   previously tried: [employer] is still working great, it is expensive but right now she is able to afford it. She applied and was denied through [employer] due to income. Unsure what happened with the last approval. For now, continue as is unless she reports not being able to afford the copay.  Update 01/18/22 She reports she was approved for [employer] through [employer].  However, she has not gotten medication for free yet.  She was given instructions on calling [employer] to follow up on application.  Medication is always mailed to the patient's house she will not pick up at pharmacy.  She should not have to pay to pick up if already approved. Encouraged her to call me if she continues to have issues with the cost of this. No changes at this time.  Patient Goals/Self-Care Activities Patient will:  - check blood pressure weekly, document, and provide at future appointments collaborate with provider on medication access solutions  Follow Up Plan: The care management team will reach out to the patient again over the next 180 days.               Medication Assistance: Application for [employer]  medication assistance program. in process.  Anticipated assistance start date unknown.  See plan of care for additional detail.  Compliance/Adherence/Medication fill history: Care Gaps: Dexa Scan Mammogram  Star-Rating Drugs: N/A  Patient's preferred pharmacy is:  [id number] - [address] [place] [address] Phone: [phone] Fax: [phone]   Uses pill box? Yes Pt endorses 100% compliance  We discussed: Benefits of medication synchronization, packaging and delivery as well as enhanced pharmacist oversight with [employer]. Patient decided to: Continue current medication management strategy  Care Plan and Follow Up Patient Decision:  Patient agrees to Care Plan and  Follow-up.  Plan: The care management team will reach out to the patient again over the next 30 days.  Beverly Milch, PharmD Clinical Pharmacist  [facility] [phone]

## 2022-08-02 ENCOUNTER — Ambulatory Visit: Payer: Medicare Other | Admitting: Pharmacist

## 2022-08-02 DIAGNOSIS — G43109 Migraine with aura, not intractable, without status migrainosus: Secondary | ICD-10-CM

## 2022-08-02 DIAGNOSIS — I10 Essential (primary) hypertension: Secondary | ICD-10-CM

## 2022-08-02 NOTE — Patient Instructions (Addendum)
Visit Information   Goals Addressed             This Visit's Progress    :Track and Manage My Blood Pressure-Hypertension   On track    Resolving due to duplicate goal  Timeframe:  Long-Range Goal Priority:  Medium Start Date:   10/19/21                          Expected End Date:   04/19/22               Follow Up Date 01/17/22    - check blood pressure 3 times per week - choose a place to take my blood pressure (home, clinic or office, retail store) - write blood pressure results in a log or diary    Why is this important?   You won't feel high blood pressure, but it can still hurt your blood vessels.  High blood pressure can cause heart or kidney problems. It can also cause a stroke.  Making lifestyle changes like losing a little weight or eating less salt will help.  Checking your blood pressure at home and at different times of the day can help to control blood pressure.  If the doctor prescribes medicine remember to take it the way the doctor ordered.  Call the office if you cannot afford the medicine or if there are questions about it.     Notes:        Patient Care Plan: Chronic pain from migraine and osteoarthritis  Completed 09/21/2021   Problem Identified: Need for long term self management of chronic pain from migraine and osteoarthritis Resolved 09/21/2021  Priority: High     Long-Range Goal: Manage Pain Completed 09/21/2021  Start Date: 07/20/2021  Expected End Date: 01/04/2022  Recent Progress: On track  Priority: High  Note:   Resolving due to duplicate goal  Current Barriers:  Knowledge Deficits related to self-health management of acute or chronic pain from migraines and osteoarthritis Chronic Disease Management support and education needs related to chronic pain from migraine and osteoarthritis Knowledge Deficits related to Chronic pain from migraine and osteoarthritis Chronic Disease Management support and education needs related to Chronic pain  from migraine and osteoarthritis Financial Constraints. Cost of Nurtec Unable to independently Chronic pain from migraine and osteoarthritis States that her migraine's are helped when she takes the Nurtec but she is in the doughnut hole and can not afford the medication now.  States she does take Excedrin and Motrin as neurology told her to take as needed.  States her lt knee may need to be replaced and she is to see orthopedics tomorrow.  States she has been working on weight loss by eating less and walking for exercise so that she can have surgery if needed Clinical Goal(s):  patient will verbalize understanding of plan for pain management. , patient will attend all scheduled medical appointments: CCM PharmD 09/28/21, neurology 01/12/21, orthopedics 07/21/21, patient will demonstrate use of different relaxation  skills and/or diversional activities to assist with pain reduction (distraction, imagery, relaxation, massage, acupressure, TENS, heat, and cold application., patient will report pain at a level less than 3 to 4 on a 10-10 rating scale., patient will use pharmacological and nonpharmacological pain relief strategies as prescribed. , and patient will verbalize acceptable level of pain relief and ability to engage in desired activities Interventions:  Collaboration with Midge Minium, MD regarding development and update of comprehensive plan of care  as evidenced by provider attestation and co-signature Pain assessment performed Medications reviewed Discussed plans with patient for ongoing care management follow up and provided patient with direct contact information for care management team Evaluation of current treatment plan related to Chronic pain from migraine and osteoarthritis and patient's adherence to plan as established by provider. Provided education to patient re: Chronic pain from migraine and osteoarthritis Reviewed medications with patient and discussed adherence to medications   Reviewed scheduled/upcoming provider appointments including:  Pharmacy referral for cost of Nurtec and scheduled for 09/28/21 Discussed plans with patient for ongoing care management follow up and provided patient with direct contact information for care management team Reviewed to cal RNCM with their household income to give to [facility] PharmD to see they they will qualify for pharmacy assistance Patient Goals/Self Care Activities:  Will self-administer medications as prescribed Will attend all scheduled provider appointments Will call pharmacy for medication refills 7 days prior to needed refill date Patient will calls provider office for new concerns or questions Follow Up Plan: Telephone follow up appointment with care management team member scheduled for: 08/31/21 at 9:45 AM The patient has been provided with contact information for the care management team and has been advised to call with any health related questions or concerns.       Patient Care Plan: Lack of long term self managment of Hypertension  Completed 09/21/2021   Problem Identified: Hypertension (Hypertension) Resolved 09/21/2021     Long-Range Goal: Effective  long term self managment of Hypertension Completed 09/21/2021  Start Date: 07/20/2021  Expected End Date: 01/04/2022  Recent Progress: On track  Priority: Medium  Note:   Resolving due to duplicate goal  Current Barriers:  Knowledge Deficits related to basic understanding of hypertension pathophysiology and self care management Unable to independently self manage Hypertension Does not adhere to prescribed medication regimen States she checks her B/P about 3 times a week and it was 144/75 today.  States she stopped taking the Valsartan because it made her legs hurt.  States she try to eat health but she is on a Keto diet with more meats.  States she is walking for exercise and would like to lose 10 more lbs Nurse Case Manager Clinical Goal(s):  patient will  verbalize understanding of plan for hypertension management patient will not experience hospital admission. Hospital Admissions in last 6 months = 0 patient will attend all scheduled medical appointments: CCM PharmD 09/28/21 patient will demonstrate improved adherence to prescribed treatment plan for hypertension as evidenced by taking all medications as prescribed, monitoring and recording blood pressure as directed, adhering to low sodium/DASH diet patient will demonstrate improved health management independence as evidenced by checking blood pressure as directed and notifying PCP if SBP>160 or DBP > 90, taking all medications as prescribe, and adhering to a low sodium diet as discussed. patient will verbalize basic understanding of hypertension disease process and self health management plan as evidenced by readings within limits, adherence to diet and medications Interventions:  Collaboration with Midge Minium, MD regarding development and update of comprehensive plan of care as evidenced by provider attestation and co-signature Inter-disciplinary care team collaboration (see longitudinal plan of care) Evaluation of current treatment plan related to hypertension self management and patient's adherence to plan as established by provider. Provided education to patient re: stroke prevention, s/s of heart attack and stroke, DASH diet, complications of uncontrolled blood pressure Reviewed medications with patient and discussed importance of compliance Discussed plans with patient for ongoing  care management follow up and provided patient with direct contact information for care management team Advised patient, providing education and rationale, to monitor blood pressure daily and record, calling PCP for findings outside established parameters.  Reviewed scheduled/upcoming provider appointments including: CCM PharmD 09/28/21 Self-Care Activities:  Self administers medications as  prescribed Attends all scheduled provider appointments Calls pharmacy for medication refills Calls provider office for new concerns or questions Patient Goals  - Self administer medications as prescribed  - Attend all scheduled provider appointments  - Call provider office for new concerns, questions, or BP outside discussed parameters  - Check BP and record as discussed  - Follows a low sodium diet/DASH diet - check blood pressure 3 times per week - choose a place to take my blood pressure (home, clinic or office, retail store) - write blood pressure results in a log or diary - ask questions to understand - change to whole grain breads, cereal, pasta - drink 6 to 8 glasses of water each day - eat fish at least once per week - fill half of plate with vegetables - limit fast food meals to no more than 1 per week - prepare main meal at home 3 to 5 days each week - read food labels for fat, fiber, carbohydrates and portion size - reduce red meat to 2 to 3 times a week - switch to low-fat or skim milk - switch to sugar-free drinks Follow Up Plan: Telephone follow up appointment with care management team member scheduled for: 08/31/21 at 9:45 AM The patient has been provided with contact information for the care management team and has been advised to call with any health related questions or concerns.      Patient Care Plan: RN Care Manager Plan of Care  Completed 05/03/2022   Problem Identified: Chronic Disease Management and Care Coordination Needs (HTN, Migraine, osteoarthritis) Resolved 05/03/2022  Priority: High     Long-Range Goal: Establish Plan of Care for Chronic Disease Management Needs (HTN, Migraine, osteoarthritis) Completed 05/03/2022  Start Date: 09/21/2021  Expected End Date: 02/08/2023  Priority: High  Note:   RNCM case closed goals met Current Barriers:  Chronic Disease Management support and education needs related to HTN, Osteoarthritis, and Migraines  States her  B/P is better since she started taking the amlodipine.  States that her migraine's are continuing to be  better since she started taking the Emgality injection. States the Nye is expensive and she is not sure she can keep getting it filled. States she is still waiting to see if she was approved for patient assistance for the [employer]. States she does take Excedrin and Motrin as neurology told her to take as needed.  States her lt knee may need to be replaced and she is still  working weight loss so she can have a knee replacement.  States she has been working on weight loss by eating less and walking for exercise so that she can have surgery if needed.  States she and her husband are moving soon into a retirement home at [employer]. States it is only 15 minutes away from their daughter in [place]. RNCM Clinical Goal(s):  Patient will verbalize understanding of plan for management of HTN, Osteoarthritis, and Migraines as evidenced by voiced adherence to plan of care verbalize basic understanding of  HTN, Osteoarthritis, and Migraines disease process and self health management plan as evidenced by voiced understanding and teach back take all medications exactly as prescribed and will call provider for medication  related questions as evidenced by dispense report and pt verbalization attend all scheduled medical appointments: Dr. Birdie Riddle 02/20/22 CCM PharmD 08/02/22 as evidenced by medical records demonstrate Improved adherence to prescribed treatment plan for HTN, Osteoarthritis, and Migraines as evidenced by reading within limits, reports of improved pain management, voices adherence to plan of care continue to work with RN Care Manager to address care management and care coordination needs related to  HTN, Osteoarthritis, and Migraines as evidenced by adherence to CM Team Scheduled appointments work with pharmacist to address Financial constraints related to cost of medications related toHTN,  Osteoarthritis, and Migraines as evidenced by review or EMR and patient or pharmacist report through collaboration with Consulting civil engineer, provider, and care team.   Interventions: 1:1 collaboration with primary care provider regarding development and update of comprehensive plan of care as evidenced by provider attestation and co-signature Inter-disciplinary care team collaboration (see longitudinal plan of care) Evaluation of current treatment plan related to  self management and patient's adherence to plan as established by provider Communicated to PharmD concerns about cost of Emgality   Hypertension Interventions:  (Status:  Goal Met.) Long Term Goal Last practice recorded BP readings:  BP Readings from Last 3 Encounters:  02/21/22 (!) 163/70  02/20/22 130/60  01/12/22 (!) 153/75  Most recent eGFR/CrCl: No results found for: EGFR  No components found for: CRCL  Evaluation of current treatment plan related to hypertension self management and patient's adherence to plan as established by provider Provided education to patient re: stroke prevention, s/s of heart attack and stroke Reviewed medications with patient and discussed importance of compliance Counseled on the importance of exercise goals with target of 150 minutes per week Discussed plans with patient for ongoing care management follow up and provided patient with direct contact information for care management team Advised patient, providing education and rationale, to monitor blood pressure daily and record, calling PCP for findings outside established parameters Provided education on prescribed diet low sodium DASH diet Reviewed to not stop her B/P medication unless ordered by doctor. Reinforced to try to check her B/P regularly and to check when her husband checks his B/P  Pain/Migraine Interventions:  (Status:  Goal Met.) Long Term Goal Pain assessment performed Medications reviewed Reviewed provider established plan for  pain management Discussed importance of adherence to all scheduled medical appointments Counseled on the importance of reporting any/all new or changed pain symptoms or management strategies to pain management provider Advised patient to report to care team affect of pain on daily activities Discussed use of relaxation techniques and/or diversional activities to assist with pain reduction (distraction, imagery, relaxation, massage, acupressure, TENS, heat, and cold application Reviewed with patient prescribed pharmacological and nonpharmacological pain relief strategies Reviewed to contact neurology to make follow up appointment     Patient Goals/Self-Care Activities: Take all medications as prescribed Attend all scheduled provider appointments Call pharmacy for medication refills 3-7 days in advance of running out of medications Call provider office for new concerns or questions  practice acceptance of chronic pain,practice relaxation or meditation daily check blood pressure daily choose a place to take my blood pressure (home, clinic or office, retail store) keep a blood pressure log take blood pressure log to all doctor appointments take medications for blood pressure exactly as prescribed report new symptoms to your doctor eat more whole grains, fruits and vegetables, lean meats and healthy fats limit salt intake to 2357m/day  Follow Up Plan:  The patient has been provided with contact  information for the care management team and has been advised to call with any health related questions or concerns.  No further follow up required: RNCM case closed goals met        Patient Care Plan: General Pharmacy (Adult)     Problem Identified: HTN, GERD, Osteoarthritis   Priority: High  Onset Date: 10/19/2021     Long-Range Goal: Patient-Specific Goal   Start Date: 10/19/2021  Expected End Date: 04/19/2022  Recent Progress: On track  Priority: High  Note:   Current Barriers:   Unable to independently afford treatment regimen Unable to achieve control of blood pressure   Pharmacist Clinical Goal(s):  Patient will verbalize ability to afford treatment regimen achieve improvement in BP as evidenced by home monitoring through collaboration with PharmD and provider.   Interventions: 1:1 collaboration with Midge Minium, MD regarding development and update of comprehensive plan of care as evidenced by provider attestation and co-signature Inter-disciplinary care team collaboration (see longitudinal plan of care) Comprehensive medication review performed; medication list updated in electronic medical record  Hypertension (BP goal <140/90) 08/02/22 -Controlled based on her home readings -Current treatment: Amlodipine 43m Appropriate, Effective, Safe, Accessible -Medications previously tried: Benazepril (cough) -Current home readings: 1161/096Esystolic 745/40Jsystolic. -Denies hypotensive/hypertensive symptoms -Educated on BP goals and benefits of medications for prevention of heart attack, stroke and kidney damage; Importance of home blood pressure monitoring; Proper BP monitoring technique; Symptoms of hypotension and importance of maintaining adequate hydration; -She is completely off of benazepril now due to cough, the cough has gone away since stopping the medication. Amlodipine alone seems to be controlling BP to goal.  Update 01/18/22 Has not  been checking her BP at home due to busy week. Recently stopped benazepril due to cough she was having.  She resumed amlodipine 13mbut has not been checking her BP at home.  Discussed importance of BP monitoring.  Patient agrees to start checking more and recording up to twice daily. Will FU in a week or so to see how BP is doing. No changes at this time.  Osteoarthritis (Goal: Minimize pain) -Controlled -Current treatment  IBU 80012m8h prn -Medications previously tried: Tramadol -Patient mentions she  often has pain from osteoarthritis.  She does not like to take a lot of medication.  She has [facility] type [facility].  -Recommended to continue current medication  GERD (Goal: Minimize symptoms) -Controlled -Current treatment  Omeprazole 43m87m10 year history of med  -Medications previously tried: none noted -Rarely uses this medication - denies any symptoms of acid reflux recently  -Recommended she continue to watch trigger foods.  No need to taper since she has not taken consistently. -I believe she would benefit from DEXA scan, she does not feel at her age she would want to do this.  Migraine w/ Aura (Goal: Reduce occurrence) 08/02/22 -Controlled -Current treatment  Emgality 143mg36mry 30 days Appropriate, Effective, Safe, Accessible Nurtec 75mg 22mAppropriate, Effective, Safe, Accessible -Medications previously tried: Nurtec -Emgality is still working great, it is expensive but right now she is able to afford it. She applied and was denied through [employer] income. Unsure what happened with the last approval. For now, continue as is unless she reports not being able to afford the copay.  Update 01/18/22 She reports she was approved for [employer],  she has not gotten medication for free yet.  She was given instructions on calling [employer] to follow up on application.  Medication is always mailed to the patient's house she will not pick up at pharmacy.  She should not have to pay to pick up if already approved. Encouraged her to call me if she continues to have issues with the cost of this. No changes at this time.  Patient Goals/Self-Care Activities Patient will:  - check blood pressure weekly, document, and provide at future appointments collaborate with provider on medication access solutions  Follow Up Plan: The care management team  will reach out to the patient again over the next 180 days.             The patient verbalized understanding of instructions, educational materials, and care plan provided today and DECLINED offer to receive copy of patient instructions, educational materials, and care plan.  Telephone follow up appointment with pharmacy team member scheduled for: 6 months  Edythe Clarity, [place], PharmD Clinical Pharmacist  [facility] [phone]

## 2022-08-29 ENCOUNTER — Telehealth: Payer: Self-pay | Admitting: *Deleted

## 2022-08-29 NOTE — Telephone Encounter (Signed)
Submitted PA Emgality on CMM. Key: [id number]. Waiting on determination from [facility].

## 2022-08-30 NOTE — Telephone Encounter (Signed)
PA approved effective from 08/29/2022 through 08/30/2023.

## 2022-09-07 ENCOUNTER — Telehealth: Payer: Self-pay | Admitting: Pharmacist

## 2022-09-07 NOTE — Progress Notes (Signed)
    Chronic Care Management Pharmacy Assistant   Name: Ann Conley  MRN: [id number] DOB: [birth year]   Reason for Encounter: Disease State - Hypertension Call     Recent office visits:  None noted.   Recent consult visits:  None noted.   Hospital visits:  None in previous 6 months  Medications: Outpatient Encounter Medications as of 09/07/2022  Medication Sig   amLODipine (NORVASC) 10 MG tablet Take 1 tablet (10 mg total) by mouth daily.   Ascorbic Acid (VITAMIN C PO) Take by mouth.   aspirin 81 MG tablet Take 81 mg by mouth every evening.    Galcanezumab-gnlm (EMGALITY) 120 MG/ML SOAJ Inject 120 mg into the skin every 30 (thirty) days.   ibuprofen (ADVIL) 800 MG tablet TAKE ONE TABLET BY MOUTH EVERY 8 HOURS AS NEEDED FOR HEADACHE   Magnesium 250 MG TABS Take 250 mg by mouth daily.   multivitamin-lutein (OCUVITE-LUTEIN) CAPS capsule Take 1 capsule by mouth daily.   omeprazole (PRILOSEC) 20 MG capsule Take 1 capsule (20 mg total) by mouth daily.   ondansetron (ZOFRAN-ODT) 4 MG disintegrating tablet Take 1 tablet (4 mg total) by mouth daily as needed for nausea or vomiting.   Rimegepant Sulfate (NURTEC) 75 MG TBDP Take 75 mg by mouth daily as needed (take for abortive therapy of migraine, no more than 1 tablet in 24 hours or 8 per month).   traMADol (ULTRAM) 50 MG tablet Take 50 mg by mouth every 6 (six) hours as needed.   vitamin B-12 (CYANOCOBALAMIN) 100 MCG tablet Take 100 mcg by mouth daily.   No facility-administered encounter medications on file as of 09/07/2022.    Current antihypertensive regimen:  Amlodipine '10mg'$    How often are you checking your Blood Pressure?  Patient reported checking blood pressures  Current home BP readings: 120/70 (pt reported)    What recent interventions/DTPs have been made by any provider to improve Blood Pressure control since last CPP Visit:  Patient denied any recent changes in medications since last visit  with CPP.    Any  recent hospitalizations or ED visits since last visit with CPP? Patient has not had any hospitalizations or ED visits since last visit with CPP    What diet changes have been made to improve Blood Pressure Control?  Patient reported she is careful with her sodium intake in her diet.    What exercise is being done to improve your Blood Pressure Control?   Patient reports she tries to walk and remain as active as possible.    Adherence Review: Is the patient currently on ACE/ARB medication? No Does the patient have >5 day gap between last estimated fill dates? No      Care Gaps   AWV: done 07/22/21 Colonoscopy: done 11/17/02 DM Eye Exam: N/A DM Foot Exam: N/A Microalbumin: N/A HbgAIC: N/A DEXA: unknown  Mammogram: done 04/26/21 has been ordered     Star Rating Drugs: No star rating drugs noted.    Future Appointments  Date Time Provider [place]  01/23/2023  2:30 PM Debbora Presto, NP [facility] None  02/07/2023  2:00 PM [facility] CCM PHARMACIST [facility] [place], [place] Clinical Pharmacist Assistant  [phone]

## 2022-09-25 ENCOUNTER — Other Ambulatory Visit: Payer: Self-pay

## 2022-09-25 DIAGNOSIS — K219 Gastro-esophageal reflux disease without esophagitis: Secondary | ICD-10-CM

## 2022-09-25 MED ORDER — OMEPRAZOLE 20 MG PO CPDR: 20.0000 mg | DELAYED_RELEASE_CAPSULE | Freq: Every day | ORAL | 1 refills | Status: DC

## 2022-10-05 ENCOUNTER — Ambulatory Visit (INDEPENDENT_AMBULATORY_CARE_PROVIDER_SITE_OTHER): Payer: Medicare Other | Admitting: *Deleted

## 2022-10-05 DIAGNOSIS — Z0000 Encounter for general adult medical examination without abnormal findings: Secondary | ICD-10-CM

## 2022-10-05 NOTE — Patient Instructions (Signed)
[name] , Thank you for taking time to come for your Medicare Wellness Visit. I appreciate your ongoing commitment to your health goals. Please review the following plan we discussed and let me know if I can assist you in the future.   These are the goals we discussed:  Goals      :Track and Manage My Blood Pressure-Hypertension     Resolving due to duplicate goal  Timeframe:  Long-Range Goal Priority:  Medium Start Date:   10/19/21                          Expected End Date:   04/19/22               Follow Up Date 01/17/22    - check blood pressure 3 times per week - choose a place to take my blood pressure (home, clinic or office, retail store) - write blood pressure results in a log or diary    Why is this important?   You won't feel high blood pressure, but it can still hurt your blood vessels.  High blood pressure can cause heart or kidney problems. It can also cause a stroke.  Making lifestyle changes like losing a little weight or eating less salt will help.  Checking your blood pressure at home and at different times of the day can help to control blood pressure.  If the doctor prescribes medicine remember to take it the way the doctor ordered.  Call the office if you cannot afford the medicine or if there are questions about it.     Notes:      patient     Maintain current health by staying active.      Patient Stated     No goals        This is a list of the screening recommended for you and due dates:  Health Maintenance  Topic Date Due   Mammogram  04/26/2022   COVID-19 Vaccine (3 - 2023-24 season) 10/21/2022*   Zoster (Shingles) Vaccine (1 of 2) 01/04/2023*   DEXA scan (bone density measurement)  10/06/2023*   Medicare Annual Wellness Visit  10/06/2023   DTaP/Tdap/Td vaccine (2 - Td or Tdap) 09/01/2026   Pneumonia Vaccine  Completed   HPV Vaccine  Aged Out   Flu Shot  Discontinued  *Topic was postponed. The date shown is not the original due date.     Advanced directives: Education [provided  Conditions/risks identified:      Preventive Care 80 Years and Older, Female Preventive care refers to lifestyle choices and visits with your health care provider that can promote health and wellness. What does preventive care include? A yearly physical exam. This is also called an annual well check. Dental exams once or twice a year. Routine eye exams. Ask your health care provider how often you should have your eyes checked. Personal lifestyle choices, including: Daily care of your teeth and gums. Regular physical activity. Eating a healthy diet. Avoiding tobacco and drug use. Limiting alcohol use. Practicing safe sex. Taking low-dose aspirin every day. Taking vitamin and mineral supplements as recommended by your health care provider. What happens during an annual well check? The services and screenings done by your health care provider during your annual well check will depend on your age, overall health, lifestyle risk factors, and family history of disease. Counseling  Your health care provider may ask you questions about your: Alcohol use. Tobacco  use. Drug use. Emotional well-being. Home and relationship well-being. Sexual activity. Eating habits. History of falls. Memory and ability to understand (cognition). Work and work Statistician. Reproductive health. Screening  You may have the following tests or measurements: Height, weight, and BMI. Blood pressure. Lipid and cholesterol levels. These may be checked every 5 years, or more frequently if you are over 40 years old. Skin check. Lung cancer screening. You may have this screening every year starting at age 7 if you have a 30-pack-year history of smoking and currently smoke or have quit within the past 15 years. Fecal occult blood test (FOBT) of the stool. You may have this test every year starting at age 59. Flexible sigmoidoscopy or colonoscopy. You may have a  sigmoidoscopy every 5 years or a colonoscopy every 10 years starting at age 70. Hepatitis C blood test. Hepatitis B blood test. Sexually transmitted disease (STD) testing. Diabetes screening. This is done by checking your blood sugar (glucose) after you have not eaten for a while (fasting). You may have this done every 1-3 years. Bone density scan. This is done to screen for osteoporosis. You may have this done starting at age 64. Mammogram. This may be done every 1-2 years. Talk to your health care provider about how often you should have regular mammograms. Talk with your health care provider about your test results, treatment options, and if necessary, the need for more tests. Vaccines  Your health care provider may recommend certain vaccines, such as: Influenza vaccine. This is recommended every year. Tetanus, diphtheria, and acellular pertussis (Tdap, Td) vaccine. You may need a Td booster every 10 years. Zoster vaccine. You may need this after age 30. Pneumococcal 13-valent conjugate (PCV13) vaccine. One dose is recommended after age 67. Pneumococcal polysaccharide (PPSV23) vaccine. One dose is recommended after age 81. Talk to your health care provider about which screenings and vaccines you need and how often you need them. This information is not intended to replace advice given to you by your health care provider. Make sure you discuss any questions you have with your health care provider. Document Released: 11/19/2015 Document Revised: 07/12/2016 Document Reviewed: 08/24/2015 Elsevier Interactive Patient Education  2017 [employer] Prevention in the Home Falls can cause injuries. They can happen to people of all ages. There are many things you can do to make your home safe and to help prevent falls. What can I do on the outside of my home? Regularly fix the edges of walkways and driveways and fix any cracks. Remove anything that might make you trip as you walk through a  door, such as a raised step or threshold. Trim any bushes or trees on the path to your home. Use bright outdoor lighting. Clear any walking paths of anything that might make someone trip, such as rocks or tools. Regularly check to see if handrails are loose or broken. Make sure that both sides of any steps have handrails. Any raised decks and porches should have guardrails on the edges. Have any leaves, snow, or ice cleared regularly. Use sand or salt on walking paths during winter. Clean up any spills in your garage right away. This includes oil or grease spills. What can I do in the bathroom? Use night lights. Install grab bars by the toilet and in the tub and shower. Do not use towel bars as grab bars. Use non-skid mats or decals in the tub or shower. If you need to sit down in the shower, use a  plastic, non-slip stool. Keep the floor dry. Clean up any water that spills on the floor as soon as it happens. Remove soap buildup in the tub or shower regularly. Attach bath mats securely with double-sided non-slip rug tape. Do not have throw rugs and other things on the floor that can make you trip. What can I do in the bedroom? Use night lights. Make sure that you have a light by your bed that is easy to reach. Do not use any sheets or blankets that are too big for your bed. They should not hang down onto the floor. Have a firm chair that has side arms. You can use this for support while you get dressed. Do not have throw rugs and other things on the floor that can make you trip. What can I do in the kitchen? Clean up any spills right away. Avoid walking on wet floors. Keep items that you use a lot in easy-to-reach places. If you need to reach something above you, use a strong step stool that has a grab bar. Keep electrical cords out of the way. Do not use floor polish or wax that makes floors slippery. If you must use wax, use non-skid floor wax. Do not have throw rugs and other things  on the floor that can make you trip. What can I do with my stairs? Do not leave any items on the stairs. Make sure that there are handrails on both sides of the stairs and use them. Fix handrails that are broken or loose. Make sure that handrails are as long as the stairways. Check any carpeting to make sure that it is firmly attached to the stairs. Fix any carpet that is loose or worn. Avoid having throw rugs at the top or bottom of the stairs. If you do have throw rugs, attach them to the floor with carpet tape. Make sure that you have a light switch at the top of the stairs and the bottom of the stairs. If you do not have them, ask someone to add them for you. What else can I do to help prevent falls? Wear shoes that: Do not have high heels. Have rubber bottoms. Are comfortable and fit you well. Are closed at the toe. Do not wear sandals. If you use a stepladder: Make sure that it is fully opened. Do not climb a closed stepladder. Make sure that both sides of the stepladder are locked into place. Ask someone to hold it for you, if possible. Clearly mark and make sure that you can see: Any grab bars or handrails. First and last steps. Where the edge of each step is. Use tools that help you move around (mobility aids) if they are needed. These include: Canes. Walkers. Scooters. Crutches. Turn on the lights when you go into a dark area. Replace any light bulbs as soon as they burn out. Set up your furniture so you have a clear path. Avoid moving your furniture around. If any of your floors are uneven, fix them. If there are any pets around you, be aware of where they are. Review your medicines with your doctor. Some medicines can make you feel dizzy. This can increase your chance of falling. Ask your doctor what other things that you can do to help prevent falls. This information is not intended to replace advice given to you by your health care provider. Make sure you discuss any  questions you have with your health care provider. Document Released: 08/19/2009 Document Revised: 03/30/2016  Document Reviewed: 11/27/2014 Elsevier Interactive Patient Education  2017 [employer].

## 2022-10-05 NOTE — Progress Notes (Signed)
Subjective:   Ann Conley is a 88 y.o. female who presents for Medicare Annual (Subsequent) preventive examination.  I connected with  Ann Conley on 10/05/22 by a telephone enabled telemedicine application and verified that I am speaking with the correct person using two identifiers.   I discussed the limitations of evaluation and management by telemedicine. The patient expressed understanding and agreed to proceed.  Patient location: home  Provider location:   Tele-health-home    Review of Systems     Cardiac Risk Factors include: advanced age (>35mn, >>56women);hypertension;family history of premature cardiovascular disease;obesity (BMI >30kg/m2);sedentary lifestyle     Objective:    Today's Vitals   There is no height or weight on file to calculate BMI.     10/05/2022   11:32 AM 07/20/2021   10:06 AM 01/27/2021    5:35 PM 08/11/2020    5:00 PM 08/10/2018    1:03 PM 09/20/2017   11:03 AM 08/17/2016    2:20 PM  Advanced Directives  Does Patient Have a Medical Advance Directive? Yes Yes Yes Yes Yes Yes No;Yes  Type of AParamedicof [place] will   [place] will Living will Living will  Does patient want to make changes to medical advance directive?  No - Patient declined No - Patient declined No - Patient declined     Copy of [place] Chart? No - copy requested No - copy requested   No - copy requested      Current Medications (verified) Outpatient Encounter Medications as of 10/05/2022  Medication Sig   amLODipine (NORVASC) 10 MG tablet Take 1 tablet (10 mg total) by mouth daily.   Ascorbic Acid (VITAMIN C PO) Take by mouth.   aspirin 81 MG tablet Take 81 mg by mouth every evening.    Galcanezumab-gnlm (EMGALITY) 120 MG/ML SOAJ Inject 120 mg into the skin every 30 (thirty) days.   ibuprofen (ADVIL) 800 MG tablet TAKE ONE TABLET BY MOUTH EVERY 8  HOURS AS NEEDED FOR HEADACHE   Magnesium 250 MG TABS Take 250 mg by mouth daily.   multivitamin-lutein (OCUVITE-LUTEIN) CAPS capsule Take 1 capsule by mouth daily.   omeprazole (PRILOSEC) 20 MG capsule Take 1 capsule (20 mg total) by mouth daily.   ondansetron (ZOFRAN-ODT) 4 MG disintegrating tablet Take 1 tablet (4 mg total) by mouth daily as needed for nausea or vomiting.   Rimegepant Sulfate (NURTEC) 75 MG TBDP Take 75 mg by mouth daily as needed (take for abortive therapy of migraine, no more than 1 tablet in 24 hours or 8 per month).   traMADol (ULTRAM) 50 MG tablet Take 50 mg by mouth every 6 (six) hours as needed.   vitamin B-12 (CYANOCOBALAMIN) 100 MCG tablet Take 100 mcg by mouth daily.   No facility-administered encounter medications on file as of 10/05/2022.    Allergies (verified) Codeine   History: Past Medical History:  Diagnosis Date   Anemia    Blood transfusion complicating pregnancy 14403  GERD (gastroesophageal reflux disease)    History of chicken pox    Hypertension    Migraine    occular   TIA (transient ischemic attack)    Past Surgical History:  Procedure Laterality Date   BREAST BIOPSY     CHOLECYSTECTOMY     PARTIAL HYSTERECTOMY     Family History  Problem Relation Age of Onset   Breast cancer Mother  Heart disease Father    Heart disease Sister        pacemaker   Diabetes Sister    Social History   Socioeconomic History   Marital status: Married    Spouse name: Not on file   Number of children: 5   Years of education: Not on file   Highest education level: Bachelor's degree (e.g., BA, AB, BS)  Occupational History   Not on file  Tobacco Use   Smoking status: Never   Smokeless tobacco: Never  Vaping Use   Vaping Use: Never used  Substance and Sexual Activity   Alcohol use: Yes    Alcohol/week: 0.0 standard drinks of alcohol    Comment: wine occ.   Drug use: No   Sexual activity: Not on file  Other Topics Concern   Not on file   Social History Narrative   Lives with husband.  5 children.  Education BA.   Social Determinants of Health   Financial Resource Strain: Low Risk  (10/05/2022)   Overall Financial Resource Strain (CARDIA)    Difficulty of Paying Living Expenses: Not hard at all  Food Insecurity: No Food Insecurity (10/05/2022)   Hunger Vital Sign    Worried About Running Out of Food in the Last Year: Never true    Ran Out of Food in the Last Year: Never true  Transportation Needs: No Transportation Needs (10/05/2022)   PRAPARE - Hydrologist (Medical): No    Lack of Transportation (Non-Medical): No  Physical Activity: Inactive (10/05/2022)   Exercise Vital Sign    Days of Exercise per Week: 0 days    Minutes of Exercise per Session: 0 min  Stress: No Stress Concern Present (10/05/2022)   [place]    Feeling of Stress : Not at all  Social Connections: [place] (10/05/2022)   Social Connection and Isolation Panel [NHANES]    Frequency of Communication with Friends and Family: More than three times a week    Frequency of Social Gatherings with Friends and Family: Three times a week    Attends Religious Services: More than 4 times per year    Active Member of Clubs or Organizations: Yes    Attends Music therapist: More than 4 times per year    Marital Status: Married    Tobacco Counseling Counseling given: Not Answered   Clinical Intake:  Pre-visit preparation completed: Yes  Pain : No/denies pain     Diabetes: No  How often do you need to have someone help you when you read instructions, pamphlets, or other written materials from your doctor or pharmacy?: 1 - Never  Diabetic?  no  Interpreter Needed?: No  Information entered by :: Leroy Kennedy LPN   Activities of Daily Living    10/05/2022   11:57 AM  In your present state of health, do you have any  difficulty performing the following activities:  Hearing? 0  Vision? 0  Difficulty concentrating or making decisions? 0  Walking or climbing stairs? 0  Dressing or bathing? 0  Doing errands, shopping? 0  Preparing Food and eating ? N  Using the Toilet? N  In the past six months, have you accidently leaked urine? N  Do you have problems with loss of bowel control? N  Managing your Medications? N  Managing your Finances? N  Housekeeping or managing your Housekeeping? N    Patient Care Team: Annye Asa  E, MD as PCP - General (Family Medicine) Lorretta Harp, MD as Consulting Physician (Cardiology) Penni Bombard, MD as Consulting Physician (Neurology) Calvert Cantor, MD as Consulting Physician (Ophthalmology) Edythe Clarity, [facility] (Pharmacist)  Indicate any recent Medical Services you may have received from other than [facility] providers in the past year (date may be approximate).     Assessment:   This is a routine wellness examination for [facility].  Hearing/Vision screen Hearing Screening - Comments:: No hearing aids Vision Screening - Comments:: Not up to date  Dietary issues and exercise activities discussed: Current Exercise Habits: The patient does not participate in regular exercise at present   Goals Addressed             This Visit's Progress    Patient Stated       No goals       Depression Screen    10/05/2022   11:41 AM 12/28/2021   12:08 PM 08/16/2021    1:56 PM 07/20/2021   10:06 AM 04/28/2021   11:10 AM 03/17/2021    1:38 PM 01/31/2021    2:14 PM  PHQ 2/9 Scores  PHQ - 2 Score 0 0 0 0 0 0 0  PHQ- 9 Score 0 0   0 0 0    Fall Risk    10/05/2022   11:32 AM 01/12/2022    1:10 PM 12/28/2021   12:08 PM 08/16/2021    1:56 PM 07/20/2021    9:54 AM  [place] in the past year? 0 1 0 0 1  Number falls in past yr: 0 0  0 1  Comment     tripped on base board  Injury with Fall? 0   0 1  Comment  knee injury   hit face and lt knee   Risk for fall due to :   No Fall Risks History of fall(s) Impaired balance/gait;Medication side effect  Follow up Falls evaluation completed;Education provided;Falls prevention discussed  Falls evaluation completed Falls evaluation completed Education provided;Falls prevention discussed    FALL RISK PREVENTION PERTAINING TO THE HOME:  Any stairs in or around the home? Yes  If so, are there any without handrails? No  Home free of loose throw rugs in walkways, pet beds, electrical cords, etc? Yes  Adequate lighting in your home to reduce risk of falls? Yes   ASSISTIVE DEVICES UTILIZED TO PREVENT FALLS:  Life alert? No  Use of a cane, walker or w/c? No  Grab bars in the bathroom? Yes  Shower chair or bench in shower? Yes  Elevated toilet seat or a handicapped toilet? Yes   TIMED UP AND GO:  Was the test performed? No .    Cognitive Function:    09/20/2017   11:05 AM  MMSE - Mini Mental State Exam  Orientation to time 5  Orientation to Place 5  Registration 3  Attention/ Calculation 5  Recall 2  Language- name 2 objects 2  Language- repeat 1  Language- follow 3 step command 3  Language- read & follow direction 1  Write a sentence 1  Copy design 1  Total score 29        10/05/2022   11:37 AM  6CIT Screen  What Year? 0 points  What month? 0 points  Count back from 20 0 points  Months in reverse 2 points  Repeat phrase 0 points    Immunizations Immunization History  Administered Date(s) Administered   [employer]  Vaccination 01/23/2020, 02/23/2020   Pneumococcal Conjugate-13 09/17/2014   Pneumococcal Polysaccharide-23 09/09/2012   Tdap 09/01/2016    TDAP status: Up to date  Flu Vaccine status: Due, Education has been provided regarding the importance of this vaccine. Advised may receive this vaccine at local pharmacy or Health Dept. Aware to provide a copy of the vaccination record if obtained from local pharmacy or Health Dept. Verbalized  acceptance and understanding.  Pneumococcal vaccine status: Up to date  Covid-19 vaccine status: Declined, Education has been provided regarding the importance of this vaccine but patient still declined. Advised may receive this vaccine at local pharmacy or Health Dept.or vaccine clinic. Aware to provide a copy of the vaccination record if obtained from local pharmacy or Health Dept. Verbalized acceptance and understanding.  Qualifies for Shingles Vaccine? Yes   Zostavax completed No   Shingrix Completed?: No.    Education has been provided regarding the importance of this vaccine. Patient has been advised to call insurance company to determine out of pocket expense if they have not yet received this vaccine. Advised may also receive vaccine at local pharmacy or Health Dept. Verbalized acceptance and understanding.  Screening Tests Health Maintenance  Topic Date Due   MAMMOGRAM  04/26/2022   COVID-19 Vaccine (3 - 2023-24 season) 10/21/2022 (Originally 07/07/2022)   Zoster Vaccines- Shingrix (1 of 2) 01/04/2023 (Originally 10/14/1986)   DEXA SCAN  10/06/2023 (Originally 10/14/2001)   Medicare Annual Wellness (AWV)  10/06/2023   DTaP/Tdap/Td (2 - Td or Tdap) 09/01/2026   Pneumonia Vaccine 79+ Years old  Completed   HPV VACCINES  Aged Out   [place] Maintenance  Health Maintenance Due  Topic Date Due   MAMMOGRAM  04/26/2022    Colorectal cancer screening: No longer required.   Mammogram completed 2022  Bone Density declined  Lung Cancer Screening: (Low Dose CT Chest recommended if Age 22-80 years, 30 pack-year currently smoking OR have quit w/in 15years.) does not qualify.   Lung Cancer Screening Referral:   Additional Screening:  Hepatitis C Screen does not qualify;   Vision Screening: Recommended annual ophthalmology exams for early detection of glaucoma and other disorders of the eye. Is the patient up to date with their annual eye exam?  No   Who is the provider or what is the name of the office in which the patient attends annual eye exams?  If pt is not established with a provider, would they like to  be referred to a provider to establish care? No .   Dental Screening: Recommended annual dental exams for proper oral hygiene  Community Resource Referral / Chronic Care Management: CRR required this visit?  No   CCM required this visit?  No      Plan:     I have personally reviewed and noted the following in the patient's chart:   Medical and social history Use of alcohol, tobacco or illicit drugs  Current medications and supplements including opioid prescriptions. Patient is not currently taking opioid prescriptions. Functional ability and status Nutritional status Physical activity Advanced directives List of other physicians Hospitalizations, surgeries, and ER visits in previous 12 months Vitals Screenings to include cognitive, depression, and falls Referrals and appointments  In addition, I have reviewed and discussed with patient certain preventive protocols, quality metrics, and best practice recommendations. A written personalized care plan for preventive services as well as general preventive health recommendations were provided to patient.     Leroy Kennedy,  LPN   [id number]   Nurse Notes:

## 2022-10-10 ENCOUNTER — Telehealth: Payer: Self-pay | Admitting: Family Medicine

## 2022-10-10 NOTE — Telephone Encounter (Signed)
Called pt back. Relayed AL,NP note. She verbalized understanding. She has 2 Nurtec left. She does not have tylenol at home. Asked if ibuprofen would be ok. I got verbal ok from Amy while she was on the phone. Advised she should not take more than 1-2 times. She will take nurtec/ibuprofen today and tomorrow. She will make sure to drink plenty of water as well.

## 2022-10-10 NOTE — Telephone Encounter (Signed)
Called pt. This past Friday, started having aura, numbness in hand/arm. Does have intermittent numbness with migraines, this is not new. She has been sick for the past week and a half. Had a severe cold. Very congested with a cough. Did not see PCP. She feels she is improving now. She has been taking coricidin cough and cold. Has not taken for the last couple days. Has never had a fever. Has cough intermittently now and some congestion.  Last took [facility] 10/03/22. Took Nurtec 10/06/22. Very effective. Took another 10/08/22 and 10/09/22, effective.  Wondering what Amy Lomax,NP would recommend since sx are ongoing. She does not want to overtake Nurtec.

## 2022-10-10 NOTE — Telephone Encounter (Signed)
Pt is calling. Stated she is having a bad migraine with hand & arm numbness. Stated this started on Friday.

## 2022-10-16 ENCOUNTER — Other Ambulatory Visit: Payer: Self-pay

## 2022-10-16 DIAGNOSIS — G43109 Migraine with aura, not intractable, without status migrainosus: Secondary | ICD-10-CM

## 2022-10-16 MED ORDER — IBUPROFEN 800 MG PO TABS: ORAL_TABLET | ORAL | 0 refills | Status: DC

## 2022-12-12 ENCOUNTER — Ambulatory Visit (INDEPENDENT_AMBULATORY_CARE_PROVIDER_SITE_OTHER)
Admission: RE | Admit: 2022-12-12 | Discharge: 2022-12-12 | Disposition: A | Discharge status: Home / Self Care | Payer: Medicare Other | Source: Ambulatory Visit | Attending: Family Medicine | Admitting: Family Medicine

## 2022-12-12 ENCOUNTER — Encounter: Payer: Self-pay | Admitting: Family Medicine

## 2022-12-12 ENCOUNTER — Ambulatory Visit (INDEPENDENT_AMBULATORY_CARE_PROVIDER_SITE_OTHER): Payer: Medicare Other | Admitting: Family Medicine

## 2022-12-12 ENCOUNTER — Other Ambulatory Visit: Payer: Self-pay | Admitting: Family Medicine

## 2022-12-12 VITALS — BP 130/58 | HR 82 | Temp 97.5°F | Wt 214.4 lb

## 2022-12-12 DIAGNOSIS — M25562 Pain in left knee: Secondary | ICD-10-CM

## 2022-12-12 DIAGNOSIS — M1712 Unilateral primary osteoarthritis, left knee: Secondary | ICD-10-CM | POA: Diagnosis not present

## 2022-12-12 MED ORDER — METHYLPREDNISOLONE 4 MG PO TBPK: ORAL_TABLET | ORAL | 0 refills | Status: DC

## 2022-12-12 NOTE — Progress Notes (Signed)
Musculoskeletal Exam  Patient: Ann Conley DOB: 09-Aug-1934  DOS: 12/12/2022  SUBJECTIVE:  Chief Complaint:   Chief Complaint  Patient presents with   Knee Pain    Left     Ann Conley is a 89 y.o.  female for evaluation and treatment of L knee pain. Here w her daughter  Onset:  2 weeks ago got worse; started 2 yrs ago. No inj or change in activity.  Location: anterior inner knee Character:  sharp  Progression of issue:  is unchanged Associated symptoms: difficult when she stands, swelling No bruising, redness, fevers Treatment: to date has been ice.   Neurovascular symptoms: no  Past Medical History:  Diagnosis Date   Anemia    Blood transfusion complicating pregnancy 5364   GERD (gastroesophageal reflux disease)    History of chicken pox    Hypertension    Migraine    occular   TIA (transient ischemic attack)     Objective: VITAL SIGNS: BP (!) 130/58 (BP Location: Left Arm, Cuff Size: Large)   Pulse 82   Temp (!) 97.5 F (36.4 C) (Oral)   Wt 214 lb 6 oz (97.2 kg)   SpO2 98%   BMI 41.87 kg/m  Constitutional: Well formed, well developed. No acute distress. Thorax & Lungs: No accessory muscle use Musculoskeletal: L knee.   Normal active range of motion: no.   Normal passive range of motion: no Tenderness to palpation: yes over medial tibia Deformity: no Ecchymosis: no Tests positive: none Tests negative: Pat app/grind Neurologic: Normal sensory function. Gait is slow and cautious.  Psychiatric: Normal mood. Age appropriate judgment and insight. Alert & oriented x 3.    Assessment:  Left knee pain, unspecified chronicity - Plan: DG Knee Complete 4 Views Left, methylPREDNISolone (MEDROL DOSEPAK) 4 MG TBPK tablet  Plan: Exacerbation of chronic issue.  Update film of left knee.  Medrol Dosepak, stretches/exercises, heat, ice, Tylenol.  Sports medicine versus Ortho depending on imaging results if no better. F/u with regular PCP as needed. The  patient and her daughter voiced understanding and agreement to the plan.   Stevens, DO 12/12/22  12:16 PM

## 2022-12-12 NOTE — Patient Instructions (Addendum)
Ice/cold pack over area for 10-15 min twice daily.  OK to take Tylenol 1000 mg (2 extra strength tabs) or 975 mg (3 regular strength tabs) every 6 hours as needed.  Please get your X-ray done in the basement of our [place] office located on: [address], [place] [address]  You do not need an appointment for that location.   Let us know if you need anything.  Knee Exercises It is normal to feel mild stretching, pulling, tightness, or discomfort as you do these exercises, but you should stop right away if you feel sudden pain or your pain gets worse.  STRETCHING AND RANGE OF MOTION EXERCISES  These exercises warm up your muscles and joints and improve the movement and flexibility of your knee. These exercises also help to relieve pain, numbness, and tingling. Exercise A: Knee Extension, Prone  Lie on your abdomen on a bed. Place your left / right knee just beyond the edge of the surface so your knee is not on the bed. You can put a towel under your left / right thigh just above your knee for comfort. Relax your leg muscles and allow gravity to straighten your knee. You should feel a stretch behind your left / right knee. Hold this position for 30 seconds. Scoot up so your knee is supported between repetitions. Repeat 2 times. Complete this stretch 3 times per week. Exercise B: Knee Flexion, Active     Lie on your back with both knees straight. If this causes back discomfort, bend your left / right knee so your foot is flat on the floor. Slowly slide your left / right heel back toward your buttocks until you feel a gentle stretch in the front of your knee or thigh. Hold this position for 30 seconds. Slowly slide your left / right heel back to the starting position. Repeat 2 times. Complete this exercise 3 times per week. Exercise C: Quadriceps, Prone     Lie on your abdomen on a firm surface, such as a bed or padded floor. Bend your left / right knee and hold your ankle. If you  cannot reach your ankle or pant leg, loop a belt around your foot and grab the belt instead. Gently pull your heel toward your buttocks. Your knee should not slide out to the side. You should feel a stretch in the front of your thigh and knee. Hold this position for 30 seconds. Repeat 2 times. Complete this stretch 3 times per week. Exercise D: Hamstring, Supine  Lie on your back. Loop a belt or towel over the ball of your left / right foot. The ball of your foot is on the walking surface, right under your toes. Straighten your left / right knee and slowly pull on the belt to raise your leg until you feel a gentle stretch behind your knee. Do not let your left / right knee bend while you do this. Keep your other leg flat on the floor. Hold this position for 30 seconds. Repeat 2 times. Complete this stretch 3 times per week. STRENGTHENING EXERCISES  These exercises build strength and endurance in your knee. Endurance is the ability to use your muscles for a long time, even after they get tired. Exercise E: Quadriceps, Isometric     Lie on your back with your left / right leg extended and your other knee bent. Put a rolled towel or small pillow under your knee if told by your health care provider. Slowly tense  the muscles in the front of your left / right thigh. You should see your kneecap slide up toward your hip or see increased dimpling just above the knee. This motion will push the back of the knee toward the floor. For 3 seconds, keep the muscle as tight as you can without increasing your pain. Relax the muscles slowly and completely. Repeat for 10 total reps Repeat 2 ti mes. Complete this exercise 3 times per week. Exercise F: Straight Leg Raises - Quadriceps  Lie on your back with your left / right leg extended and your other knee bent. Tense the muscles in the front of your left / right thigh. You should see your kneecap slide up or see increased dimpling just above the knee. Your thigh  may even shake a bit. Keep these muscles tight as you raise your leg 4-6 inches (10-15 cm) off the floor. Do not let your knee bend. Hold this position for 3 seconds. Keep these muscles tense as you lower your leg. Relax your muscles slowly and completely after each repetition. 10 total reps. Repeat 2 times. Complete this exercise 3 times per week.  Exercise G: Hamstring Curls     If told by your health care provider, do this exercise while wearing ankle weights. Begin with 5 lb weights (optional). Then increase the weight by 1 lb (0.5 kg) increments. Do not wear ankle weights that are more than 20 lbs to start with. Lie on your abdomen with your legs straight. Bend your left / right knee as far as you can without feeling pain. Keep your hips flat against the floor. Hold this position for 3 seconds. Slowly lower your leg to the starting position. Repeat for 10 reps.  Repeat 2 times. Complete this exercise 3 times per week. Exercise H: Squats (Quadriceps)  Stand in front of a table, with your feet and knees pointing straight ahead. You may rest your hands on the table for balance but not for support. Slowly bend your knees and lower your hips like you are going to sit in a chair. Keep your weight over your heels, not over your toes. Keep your lower legs upright so they are parallel with the table legs. Do not let your hips go lower than your knees. Do not bend lower than told by your health care provider. If your knee pain increases, do not bend as low. Hold the squat position for 1 second. Slowly push with your legs to return to standing. Do not use your hands to pull yourself to standing. Repeat 2 times. Complete this exercise 3 times per week. Exercise I: Wall Slides (Quadriceps)     Lean your back against a smooth wall or door while you walk your feet out 18-24 inches (46-61 cm) from it. Place your feet hip-width apart. Slowly slide down the wall or door until your knees Repeat 2  times. Complete this exercise every other day. Exercise K: Straight Leg Raises - Hip Abductors  Lie on your side with your left / right leg in the top position. Lie so your head, shoulder, knee, and hip line up. You may bend your bottom knee to help you keep your balance. Roll your hips slightly forward so your hips are stacked directly over each other and your left / right knee is facing forward. Leading with your heel, lift your top leg 4-6 inches (10-15 cm). You should feel the muscles in your outer hip lifting. Do not let your foot drift forward. Do  not let your knee roll toward the ceiling. Hold this position for 3 seconds. Slowly return your leg to the starting position. Let your muscles relax completely after each repetition. 10 total reps. Repeat 2 times. Complete this exercise 3 times per week. Exercise J: Straight Leg Raises - Hip Extensors  Lie on your abdomen on a firm surface. You can put a pillow under your hips if that is more comfortable. Tense the muscles in your buttocks and lift your left / right leg about 4-6 inches (10-15 cm). Keep your knee straight as you lift your leg. Hold this position for 3 seconds. Slowly lower your leg to the starting position. Let your leg relax completely after each repetition. Repeat 2 times. Complete this exercise 3 times per week. Document Released: 09/06/2005 Document Revised: 07/17/2016 Document Reviewed: 08/29/2015 Elsevier Interactive Patient Education  2017 [employer].

## 2022-12-13 ENCOUNTER — Other Ambulatory Visit: Payer: Self-pay

## 2022-12-13 DIAGNOSIS — G43109 Migraine with aura, not intractable, without status migrainosus: Secondary | ICD-10-CM

## 2022-12-13 MED ORDER — IBUPROFEN 800 MG PO TABS: ORAL_TABLET | ORAL | 0 refills | Status: DC

## 2022-12-14 ENCOUNTER — Encounter: Payer: Self-pay | Admitting: Family Medicine

## 2022-12-14 ENCOUNTER — Ambulatory Visit: Payer: Medicare Other | Admitting: Family Medicine

## 2022-12-14 ENCOUNTER — Ambulatory Visit: Payer: Self-pay

## 2022-12-14 VITALS — BP 136/84 | Ht 60.0 in | Wt 210.0 lb

## 2022-12-14 DIAGNOSIS — M1712 Unilateral primary osteoarthritis, left knee: Secondary | ICD-10-CM | POA: Insufficient documentation

## 2022-12-14 DIAGNOSIS — M25562 Pain in left knee: Secondary | ICD-10-CM

## 2022-12-14 NOTE — Patient Instructions (Signed)
Nice to meet you Please try ice as needed  Please try the exercises  You can try voltaren over the counter gel   Please send me a message in [place] with any questions or updates.  Please see me back in 2-3 weeks.   --Dr. Raeford Razor

## 2022-12-14 NOTE — Assessment & Plan Note (Addendum)
Acutely occurring.  Does have some irritation within the fat pad as well.  Has gotten some improvement with the steroid. -Counseled on home exercise therapy and supportive care. -Counseled on taking the steroid. - counseled on voltaren gel  -Could consider physical therapy or injection.

## 2022-12-14 NOTE — Progress Notes (Signed)
  Ann Conley - 89 y.o. female MRN [id number]  Date of birth: [birth year]  SUBJECTIVE:  Including CC & ROS.  No chief complaint on file.   Ann Conley is a 89 y.o. female that is presenting with acute left knee pain.  The pain is severe in nature.  She feels over the medial compartment.  Has started a course of steroids which has helped some with the pain.  No history of similar pain.  No history of surgery.  Localized to the knee..   Independent review of the left knee x-ray from 2/6 shows degenerative changes in the patellofemoral joint.  Review of Systems See HPI   HISTORY: Past Medical, Surgical, Social, and Family History Reviewed & Updated per EMR.   Pertinent Historical Findings include:  Past Medical History:  Diagnosis Date   Anemia    Blood transfusion complicating pregnancy 8675   GERD (gastroesophageal reflux disease)    History of chicken pox    Hypertension    Migraine    occular   TIA (transient ischemic attack)     Past Surgical History:  Procedure Laterality Date   BREAST BIOPSY     CHOLECYSTECTOMY     PARTIAL HYSTERECTOMY       PHYSICAL EXAM:  VS: BP 136/84   Ht 5' (1.524 m)   Wt 210 lb (95.3 kg)   BMI 41.01 kg/m  Physical Exam Gen: NAD, alert, cooperative with exam, well-appearing MSK:  Neurovascularly intact    Limited ultrasound: Left knee pain:  Mild effusion within the suprapatellar pouch. Normal-appearing quadricep and patellar tendon. There is some hyperemia within Hoffa's fat pad. Degenerative changes appreciated the medial compartment.  Summary: Findings consistent with degenerative changes  Ultrasound and interpretation by Clearance Coots, MD    ASSESSMENT & PLAN:   Primary osteoarthritis of left knee Acutely occurring.  Does have some irritation within the fat pad as well.  Has gotten some improvement with the steroid. -Counseled on home exercise therapy and supportive care. -Counseled on taking the steroid. -  counseled on voltaren gel  -Could consider physical therapy or injection.

## 2023-01-03 ENCOUNTER — Other Ambulatory Visit: Payer: Self-pay | Admitting: Family Medicine

## 2023-01-03 DIAGNOSIS — M25562 Pain in left knee: Secondary | ICD-10-CM

## 2023-01-04 ENCOUNTER — Ambulatory Visit: Payer: Medicare Other | Admitting: Family Medicine

## 2023-01-04 DIAGNOSIS — M1712 Unilateral primary osteoarthritis, left knee: Secondary | ICD-10-CM | POA: Diagnosis not present

## 2023-01-04 DIAGNOSIS — M170 Bilateral primary osteoarthritis of knee: Secondary | ICD-10-CM | POA: Diagnosis not present

## 2023-01-08 ENCOUNTER — Encounter: Payer: Self-pay | Admitting: Family Medicine

## 2023-01-08 ENCOUNTER — Ambulatory Visit (INDEPENDENT_AMBULATORY_CARE_PROVIDER_SITE_OTHER): Payer: Medicare Other | Admitting: Family Medicine

## 2023-01-08 VITALS — BP 140/60 | HR 96 | Temp 98.5°F | Resp 18 | Ht 60.0 in | Wt 211.6 lb

## 2023-01-08 DIAGNOSIS — M1712 Unilateral primary osteoarthritis, left knee: Secondary | ICD-10-CM

## 2023-01-08 DIAGNOSIS — J040 Acute laryngitis: Secondary | ICD-10-CM

## 2023-01-08 MED ORDER — PREDNISONE 10 MG PO TABS: ORAL_TABLET | ORAL | 0 refills | Status: DC

## 2023-01-08 NOTE — Patient Instructions (Signed)
Follow up as needed or as scheduled START the Prednisone as directed- take w/ food. ADD a daily Claritin or Zyrtec to decrease congestion and drip Drink LOTS of water! REST!!! Call with any questions or concerns Hang in there!!!

## 2023-01-08 NOTE — Progress Notes (Signed)
   Subjective:    Patient ID: Ann Conley, female    DOB: 07-01-1934, 89 y.o.   MRN: [id number]  HPI Hoarse- sxs started yesterday.  + cough- productive.  'i just feel yucky'. No fever.  No change to HA's.  Denies sinus pain/pressure. no body aches/chills.  Not currently taking allergy medication.  L knee pain- pt had a shot w/ Dr Mayer Camel on Friday.  Continues to have knee pain.  Taking Tylenol.  Using ice.     Review of Systems For ROS see HPI     Objective:   Physical Exam Vitals reviewed.  Constitutional:      General: She is not in acute distress.    Appearance: Normal appearance. She is well-developed.  HENT:     Head: Normocephalic and atraumatic.     Right Ear: Tympanic membrane and ear canal normal.     Left Ear: Tympanic membrane and ear canal normal.     Nose: Mucosal edema and congestion present. No rhinorrhea.     Right Sinus: No maxillary sinus tenderness or frontal sinus tenderness.     Left Sinus: No maxillary sinus tenderness or frontal sinus tenderness.     Mouth/Throat:     Pharynx: Posterior oropharyngeal erythema (w/ PND) present.  Eyes:     Conjunctiva/sclera: Conjunctivae normal.     Pupils: Pupils are equal, round, and reactive to light.  Cardiovascular:     Rate and Rhythm: Normal rate and regular rhythm.     Heart sounds: Normal heart sounds.  Pulmonary:     Effort: Pulmonary effort is normal. No respiratory distress.     Breath sounds: Normal breath sounds. No wheezing or rales.     Comments: Dry cough Musculoskeletal:     Cervical back: Normal range of motion and neck supple.  Lymphadenopathy:     Cervical: No cervical adenopathy.  Skin:    General: Skin is warm and dry.  Neurological:     General: No focal deficit present.     Mental Status: She is alert and oriented to person, place, and time.     Comments: + laryngitis  Psychiatric:        Mood and Affect: Mood normal.        Behavior: Behavior normal.        Thought Content: Thought  content normal.           Assessment & Plan:  Laryngitis- new.  Suspect a combination of post nasal drip and viral illness.  Will start daily antihistamine to decrease nasal congestion and drainage and will start Prednisone taper to improve vocal cord inflammation.  Reviewed supportive care and red flags that should prompt return.  Pt expressed understanding and is in agreement w/ plan.   L knee pain- deteriorated.  Following w/ Ortho.  Her level of pain is what prompted me to consider a prednisone taper for her laryngitis in hopes of treating both issues.  Pt expressed understanding and is in agreement w/ plan.

## 2023-01-22 NOTE — Patient Instructions (Incomplete)
Below is our plan:  We will continue Emgality every 30 days and Nurtec as needed. Please let me know if you have trouble getting Emgality. We could consider Qulipta 60mg  daily to see if it is better covered if you want to.   Please make sure you are staying well hydrated. I recommend 50-60 ounces daily. Well balanced diet and regular exercise encouraged. Consistent sleep schedule with 6-8 hours recommended.   Please continue follow up with care team as directed.   Follow up with me in 1 year   You may receive a survey regarding today's visit. I encourage you to leave honest feed back as I do use this information to improve patient care. Thank you for seeing me today!

## 2023-01-22 NOTE — Progress Notes (Unsigned)
No chief complaint on file.    HISTORY OF PRESENT ILLNESS:  01/22/23 ALL:  Ann Conley returns for follow up for migraines. She continues Radio producer.   02/21/2022 ALL: Ann Conley returns to discuss some concerns she has about her migraines. She continues to feel migraines are much better managed on Emgality. She continues Emgality on the 28th of every month. She reports auras return about a week before next dose is due. Nurtec works well. She has intermittent numbess. Sometimes numbness goes away spontaneously. Nurtec abort aura symptoms. She has some ice pick headaches lasting 1-2 seconds in the evenings. Symptoms resolve spontaneously.   She continues to work closely with her PCP for BP management. She has recently restarted amlodipine. Could not tolerate ACE due to cough. She was seen yesterday and BP was much better (130/60, previously 153/75). Today BP is 163/70. She takes amlodipine in the evenings. She reports home readings are A999333 systolic.    01/12/2022 ALL:  Ann Conley is a 89 y.o. female here today for follow up for migraines. She was last seen 07/2021 and advised to continue Nurtec QOD for prevention. She called 07/26/2021 reports cost of Nurtec was unaffordable. We suggested starting Emgality. Sample pens administered 08/02/2021 in the office and she verbalized willingness and comfort with continuing monthly at home. She called 09/01/2021 requesting another sample pen and for our office to help with administration. She came by the office 11/2021 and picked up Nurtec samples instead of Emgality sample. Conversation with RN 12/05/2021 reports she had returned to taking Nurtec QOD. I recommended she use Emgality for prevention and Nurtec for abortive therapy due to cost. Sample pen injected in the office 12/06/2021. She was given sample pen to administer with directions printed on packaging. She called reporting intractable migraine 12/26/2021. She reported stopping  amlodipine/benazepril due to cough and interrupted sleep. She had taken tramadol and ibuprofen but not used Nurtec. She reported being unaware she could use Nurtec with other medications. She was seen by PCP 12/28/2021 and BP med changed to amlodipine 10mg  daily.   Today, she reports she has been better since starting the Emgality. She came into the office to receive her Emgality injection 12/06/21 and was given the pen for her injection that was due on 01/03/22. She will be due for her next injection on 01/31/22. Her insurance authorized the [employer] in 09/23, but she states she never received a call. I encouraged the patient to reach out to the pharmacy to obtain this medication.    She has lost about 20 pounds in preparation for knee surgery. Labs followed with PCP. LDL 104. On asa 81mg . Patient has been hesitant to start statin. She reports taking amlodipine as prescribed.    HISTORY (copied from previous note) 07/14/21 ALL:  Ann Conley returns for follow up for worsening migraines. She was using Nurtec with some success but has not been as effective, recently. She has started taking Nurtec every other day for prevention and felt it was working fairly well until she had a migraine with aura on 8/31. She reports migraine would "come and go" for about 3 days. She did not increase Nurtec but continued every other day dosing. She also stopped propranolol and valsartan. She is uncertain of when she topped these but thinks it was about 3-4 weeks ago. She was seen by PCP 6/23 and valsartan was listed as an active medication. She reports that leg swelling and muscle aches have been better since stopping these medications. She  has been watching her diet and walking more and has lost 16 pounds.    03/07/2021 ALL: She returns for follow up for complicated migraines. MRI/MRA unremarkable. Nurtec has been helpful for abortive therapy. BP has been much better with Dr Birdie Riddle. Ann Conley states it has been elevated some at  home. She has had 2-3 headaches over the past month but reports they were not bad and aborted with Nurtec. She has not picked up prescription from pharmacy.    She does report having a fall last week. She was walking through her doorway and her foot got caught on the entryway. She hit her head up on the wall in front of her. No other injuries. She reports that her forehead has been a little sore but she is doing well, otherwise. No changes in vision. No confusion. No worsening headaches.      01/27/2021 ALL:  She returns today with concerns of left arm numbness and left visual disturbance that started 1 day ago. While playing cards, last night. She felt that her left arm was going to sleep. Numbness seemed to wax and wane and would radiate to the left jaw. She describes a kaleidoscope disturbance of the left eye last night around the same time. Visual disturbance lasted about 10 minutes. Numbness of arm is still present, today. It does not seem to make a difference of what position arm is in. Numbness seems to wax and wane. She is constantly sensitive to light. She wears sun glasses all the time, even at home. She denies head pain. She has taken Excedrin that has not helped.   She denies chest pain, trouble breathing, palpitations, sweating, abdominal or back pain, fever, chills, body aches.    She has been working closely with PCP for uncontrolled HTN. Started on valsartan last week. She continues propranolol LA 60mg  daily (for migraines) and amlodipine 10mg  daily. She is unsure if it has made much difference. BP this morning was 200/86. Her head was not hurting when she took her blood pressure. Usual home readings have been around 160/80's.   Triptans have been avoided d/t history of suspected TIA in 2019 with sudden onset of left arm weakness and facial numbness. Symptoms resolved in about an hour. Imaging unremarkable. She also has history of complicated migraine and migraine with aura. She is usually  able to abort headache with Excedrin or ibuprofen. She is concerned that arm numbness has lingered for this long as it usually resolves within 12 hours.      12/16/2020 ALL:  She returns for follow up for complicated migraines. At last visit, we encouraged her to increase propranolol. She reports taking 20mg  BID. She does feel that migraines are not as painful. She continues to have a visual aura with some sort of headache 1-3 times a month. PCP gave her 800mg  ibuprofen that seems to help. She reports taking 1-2 doses of ibuprofen.    She continues close follow up with PCP for blood pressure management on amlodipine 10mg  daily. She does check on occasion at home but does not remember what readings are. She reports that readings are usually "out of the ballpark". When I ask if today's reading is high for her she reports that it has been this high at home.      09/07/2020 ALL:  Ann Conley is a 89 y.o. female here today for follow up for complicated migraines. She has history of partial vision loss with left arm numbness associated with headache. She was  last seen 07/14/2020 by Dr Leta Baptist and advised to increase propranolol to 20mg  BID. She has not increased dose. She has had two migraines since being seen. She was evaluated by the ER on 10/6. CT brain normal. She reports symptoms resolved spontaneously without abortive therapy. She usually takes Excedrin but states that she rarely needs to take it. She reports BP is "always high". Reports it was 144/63 at home yesterday.   REVIEW OF SYSTEMS: Out of a complete 14 system review of symptoms, the patient complains only of the following symptoms, migraines, knee pain and all other reviewed systems are negative.   ALLERGIES: Allergies  Allergen Reactions   Codeine Nausea And Vomiting     HOME MEDICATIONS: Outpatient Medications Prior to Visit  Medication Sig Dispense Refill   amLODipine (NORVASC) 10 MG tablet Take 1 tablet (10 mg total) by  mouth daily. 90 tablet 3   Ascorbic Acid (VITAMIN C PO) Take by mouth.     aspirin 81 MG tablet Take 81 mg by mouth every evening.      Galcanezumab-gnlm (EMGALITY) 120 MG/ML SOAJ Inject 120 mg into the skin every 30 (thirty) days. 3 mL 3   ibuprofen (ADVIL) 800 MG tablet TAKE ONE TABLET BY MOUTH EVERY 8 HOURS AS NEEDED FOR HEADACHE 30 tablet 0   Magnesium 250 MG TABS Take 250 mg by mouth daily.     multivitamin-lutein (OCUVITE-LUTEIN) CAPS capsule Take 1 capsule by mouth daily.     omeprazole (PRILOSEC) 20 MG capsule Take 1 capsule (20 mg total) by mouth daily. 90 capsule 1   ondansetron (ZOFRAN-ODT) 4 MG disintegrating tablet Take 1 tablet (4 mg total) by mouth daily as needed for nausea or vomiting. 10 tablet 0   predniSONE (DELTASONE) 10 MG tablet 3 tabs x3 days and then 2 tabs x3 days and then 1 tab x3 days.  Take w/ food. 18 tablet 0   Rimegepant Sulfate (NURTEC) 75 MG TBDP Take 75 mg by mouth daily as needed (take for abortive therapy of migraine, no more than 1 tablet in 24 hours or 8 per month). 8 tablet 11   traMADol (ULTRAM) 50 MG tablet Take 50 mg by mouth every 6 (six) hours as needed.     vitamin B-12 (CYANOCOBALAMIN) 100 MCG tablet Take 100 mcg by mouth daily.     No facility-administered medications prior to visit.     PAST MEDICAL HISTORY: Past Medical History:  Diagnosis Date   Anemia    Blood transfusion complicating pregnancy A999333   GERD (gastroesophageal reflux disease)    History of chicken pox    Hypertension    Migraine    occular   TIA (transient ischemic attack)      PAST SURGICAL HISTORY: Past Surgical History:  Procedure Laterality Date   BREAST BIOPSY     CHOLECYSTECTOMY     PARTIAL HYSTERECTOMY       FAMILY HISTORY: Family History  Problem Relation Age of Onset   Breast cancer Mother    Heart disease Father    Heart disease Sister        pacemaker   Diabetes Sister      SOCIAL HISTORY: Social History   Socioeconomic History    Marital status: Married    Spouse name: Not on file   Number of children: 5   Years of education: Not on file   Highest education level: Bachelor's degree (e.g., BA, AB, BS)  Occupational History   Not on file  Tobacco Use  Smoking status: Never   Smokeless tobacco: Never  Vaping Use   Vaping Use: Never used  Substance and Sexual Activity   Alcohol use: Yes    Alcohol/week: 0.0 standard drinks of alcohol    Comment: wine occ.   Drug use: No   Sexual activity: Not on file  Other Topics Concern   Not on file  Social History Narrative   Lives with husband.  5 children.  Education BA.   Social Determinants of Health   Financial Resource Strain: Low Risk  (10/05/2022)   Overall Financial Resource Strain (CARDIA)    Difficulty of Paying Living Expenses: Not hard at all  Food Insecurity: No Food Insecurity (10/05/2022)   Hunger Vital Sign    Worried About Running Out of Food in the Last Year: Never true    Ran Out of Food in the Last Year: Never true  Transportation Needs: No Transportation Needs (10/05/2022)   PRAPARE - Hydrologist (Medical): No    Lack of Transportation (Non-Medical): No  Physical Activity: Inactive (10/05/2022)   Exercise Vital Sign    Days of Exercise per Week: 0 days    Minutes of Exercise per Session: 0 min  Stress: No Stress Concern Present (10/05/2022)   [place]    Feeling of Stress : Not at all  Social Connections: [place] (10/05/2022)   Social Connection and Isolation Panel [NHANES]    Frequency of Communication with Friends and Family: More than three times a week    Frequency of Social Gatherings with Friends and Family: Three times a week    Attends Religious Services: More than 4 times per year    Active Member of Clubs or Organizations: Yes    Attends Archivist Meetings: More than 4 times per year    Marital Status:  Married  Human resources officer Violence: Not At Risk (10/05/2022)   Humiliation, Afraid, Rape, and Kick questionnaire    Fear of Current or Ex-Partner: No    Emotionally Abused: No    Physically Abused: No    Sexually Abused: No     PHYSICAL EXAM  There were no vitals filed for this visit.   There is no height or weight on file to calculate BMI.  Generalized: Well developed, in no acute distress  Cardiology: normal rate and rhythm, no murmur auscultated  Respiratory: clear to auscultation bilaterally    Neurological examination  Mentation: Alert oriented to time, place, history taking. Follows all commands speech and language fluent Cranial nerve II-XII: Pupils were equal round reactive to light. Right iris with darker coloring at 6 o'clock (baseline). Extraocular movements were full, visual field were full on confrontational test. Facial sensation and strength were normal. Head turning and shoulder shrug  were normal and symmetric. Motor: The motor testing reveals 5 over 5 strength of all 4 extremities. Good symmetric motor tone is noted throughout.  Gait and station: Gait is arthritic, stable with cane     DIAGNOSTIC DATA (LABS, IMAGING, TESTING) - I reviewed patient records, labs, notes, testing and imaging myself where available.  Lab Results  Component Value Date   WBC 6.6 12/28/2021   HGB 12.8 12/28/2021   HCT 38.7 12/28/2021   MCV 85.8 12/28/2021   PLT 249.0 12/28/2021      Component Value Date/Time   NA 133 (L) 12/28/2021 1229   K 3.9 12/28/2021 1229   CL 98 12/28/2021 1229  CO2 30 12/28/2021 1229   GLUCOSE 121 (H) 12/28/2021 1229   BUN 13 12/28/2021 1229   CREATININE 0.51 12/28/2021 1229   CREATININE 0.56 (L) 12/10/2015 1537   CALCIUM 9.1 12/28/2021 1229   PROT 6.9 12/28/2021 1229   ALBUMIN 4.2 12/28/2021 1229   AST 14 12/28/2021 1229   ALT 12 12/28/2021 1229   ALKPHOS 97 12/28/2021 1229   BILITOT 0.5 12/28/2021 1229   GFRNONAA >60 01/27/2021 1757    GFRAA >60 08/10/2018 1330   Lab Results  Component Value Date   CHOL 187 12/28/2021   HDL 66.00 12/28/2021   LDLCALC 104 (H) 12/28/2021   LDLDIRECT 125.0 09/09/2012   TRIG 82.0 12/28/2021   CHOLHDL 3 12/28/2021   Lab Results  Component Value Date   HGBA1C 5.7 09/12/2019   No results found for: "VITAMINB12" Lab Results  Component Value Date   TSH 1.93 12/28/2021       09/20/2017   11:05 AM  MMSE - Mini Mental State Exam  Orientation to time 5  Orientation to Place 5  Registration 3  Attention/ Calculation 5  Recall 2  Language- name 2 objects 2  Language- repeat 1  Language- follow 3 step command 3  Language- read & follow direction 1  Write a sentence 1  Copy design 1  Total score 29         No data to display           ASSESSMENT AND PLAN  89 y.o. year old female  has a past medical history of Anemia, Blood transfusion complicating pregnancy (A999333), GERD (gastroesophageal reflux disease), History of chicken pox, Hypertension, Migraine, and TIA (transient ischemic attack). here with    No diagnosis found.  Onia reports migraines are well managed on Emgality. She will continue 1 injection on the 28th of every month. She may continue Nurtec as needed. We have discussed need for BP monitoring at home. She takes amlodipine at night. Ice pick headache could be associated with elevated BP but will have her monitor to be certain. She was encouraged to continue amlodipine for BP management. She will continue close follow up with PCP. She may return in 1 year, sooner if needed.    No orders of the defined types were placed in this encounter.    No orders of the defined types were placed in this encounter.     Debbora Presto, MSN, FNP-C 01/22/2023, 1:58 PM  [facility] [address], [place], [place] [address] [phone]

## 2023-01-23 ENCOUNTER — Ambulatory Visit: Payer: Medicare Other | Admitting: Family Medicine

## 2023-01-23 ENCOUNTER — Encounter: Payer: Self-pay | Admitting: Family Medicine

## 2023-01-23 VITALS — BP 140/66 | HR 78 | Ht 60.0 in | Wt 217.4 lb

## 2023-01-23 DIAGNOSIS — G43109 Migraine with aura, not intractable, without status migrainosus: Secondary | ICD-10-CM

## 2023-01-23 DIAGNOSIS — Z8673 Personal history of transient ischemic attack (TIA), and cerebral infarction without residual deficits: Secondary | ICD-10-CM | POA: Diagnosis not present

## 2023-01-23 MED ORDER — EMGALITY 120 MG/ML ~~LOC~~ SOAJ
120.0000 mg | SUBCUTANEOUS | 3 refills | Status: DC
  Filled 2023-04-03: qty 3, 90d supply, fill #0
  Filled 2023-05-02 – 2023-05-04 (×3): qty 1, 30d supply, fill #0
  Filled 2023-05-26: qty 1, 30d supply, fill #1
  Filled 2023-06-30: qty 1, 30d supply, fill #2
  Filled 2023-07-30: qty 1, 30d supply, fill #3
  Filled 2023-08-29: qty 1, 30d supply, fill #4
  Filled 2023-11-20: qty 1, 30d supply, fill #5
  Filled 2023-12-13 – 2023-12-29 (×4): qty 1, 30d supply, fill #6

## 2023-01-23 MED ORDER — NURTEC 75 MG PO TBDP
75.0000 mg | ORAL_TABLET | Freq: Every day | ORAL | 11 refills | Status: DC | PRN
  Filled 2023-04-03: qty 8, 30d supply, fill #0
  Filled 2023-06-30: qty 2, 7d supply, fill #0
  Filled 2023-07-27: qty 2, 7d supply, fill #1
  Filled 2023-12-06: qty 8, 30d supply, fill #0
  Filled 2023-12-06 (×2): qty 8, 30d supply, fill #2

## 2023-01-29 ENCOUNTER — Other Ambulatory Visit: Payer: Self-pay

## 2023-01-29 DIAGNOSIS — G43109 Migraine with aura, not intractable, without status migrainosus: Secondary | ICD-10-CM

## 2023-01-29 MED ORDER — IBUPROFEN 800 MG PO TABS: ORAL_TABLET | ORAL | 0 refills | Status: DC

## 2023-02-06 ENCOUNTER — Telehealth: Payer: Self-pay | Admitting: Pharmacist

## 2023-02-06 NOTE — Progress Notes (Signed)
Care Management & Coordination Services Pharmacy Team  Reason for Encounter: Appointment Reminder  Contacted patient to confirm telephone appointment with Leata Mouse, PharmD on 02/07/23 at 12PM. Unsuccessful outreach. Unable to leave voicemail. Mailbox is full.  Do you have any problems getting your medications?  If yes what types of problems are you experiencing?   What is your top health concern you would like to discuss at your upcoming visit?   Have you seen any other providers since your last visit with PCP?    Future Appointments  Date Time Provider [place]  02/07/2023 12:00 PM Edythe Clarity, [place] None  02/09/2023  2:20 PM Midge Minium, MD [facility]  01/23/2024  2:30 PM Lomax, Amy, NP [facility] None    [facility]

## 2023-02-07 ENCOUNTER — Ambulatory Visit: Payer: Medicare Other | Admitting: Pharmacist

## 2023-02-07 ENCOUNTER — Encounter: Payer: Medicare Other | Admitting: Pharmacist

## 2023-02-07 NOTE — Progress Notes (Signed)
Care Management & Coordination Services Pharmacy Note  02/08/2023 Name:  Ann Conley MRN:  [id number] DOB:  03/22/1934  Summary: PharmD FU visit.  Patient doing well.  Her husband is recovering a fall so that is how she is spending her time currently caring for him.  For now all chronic conditions controlled.  She does mention needing delivery of her meds.  I believe BCBS would be standard tier with [facility] and she is currently using preferred [employer].  May involve a higher copay.  Recommendations/Changes made from today's visit: No changes to meds, consider change to [facility]  Follow up plan: FU 6 months   Subjective: Ann Conley is an 89 y.o. year old female who is a primary patient of [name], Aundra Millet, MD.  The care coordination team was consulted for assistance with disease management and care coordination needs.    Engaged with patient by telephone for follow up visit.  Recent office visits:  08/30/21 Annye Asa, MD (PCP) - Family Medicine (Nurse visit) - Hypertension - Blood pressure check. No medication changes. Follow up as scheduled.    08/16/21 Maximiano Coss, NP - Family Medicine - Hypertension - amLODipine-benazepril (LOTREL) 10-20 MG capsule prescribed. Stop amlodipine. Start amlodipine-benazepril. Return for bp check in 1-2 week, nurse visit.   04/28/21 Annye Asa, MD (PCP) - Family Medicine - Bilateral Swelling of feet - Furosemide (LASIX) 20 MG tablet prescribed prn. Follow up as scheduled.    03/17/21 Areatha Keas, MD (PCP) - Family Medicine - Annual Physical Exam - Labs were ordered. Mammogram ordered. No medication changes. Follow up in 6 months.      Recent consult visits:  07/14/21 Debbora Presto, NP - Neurology - Migraine - recommended CGRP injection monthly for prevention but she wishes to hold off for now. She will continue Nurtec every other day. BP is a little elevated today but is much better than previous readings. I am concerned  it may continue to increase as she has stopped propranolol and valsartan. She will continue close follow up with Dr Birdie Riddle. Follow up in 6 months.   Hospital visits:  None in previous 6 months   Objective:  Lab Results  Component Value Date   CREATININE 0.51 12/28/2021   BUN 13 12/28/2021   GFR 85.25 12/28/2021   GFRNONAA >60 01/27/2021   GFRAA >60 08/10/2018   NA 133 (L) 12/28/2021   K 3.9 12/28/2021   CALCIUM 9.1 12/28/2021   CO2 30 12/28/2021   GLUCOSE 121 (H) 12/28/2021    Lab Results  Component Value Date/Time   HGBA1C 5.7 09/12/2019 11:36 AM   HGBA1C 5.6 09/11/2018 09:34 AM   GFR 85.25 12/28/2021 12:29 PM   GFR 83.81 03/17/2021 02:07 PM    Last diabetic Eye exam: No results found for: "HMDIABEYEEXA"  Last diabetic Foot exam: No results found for: "HMDIABFOOTEX"   Lab Results  Component Value Date   CHOL 187 12/28/2021   HDL 66.00 12/28/2021   LDLCALC 104 (H) 12/28/2021   LDLDIRECT 125.0 09/09/2012   TRIG 82.0 12/28/2021   CHOLHDL 3 12/28/2021       Latest Ref Rng & Units 12/28/2021   12:29 PM 03/17/2021    2:07 PM 01/27/2021    5:57 PM  Hepatic Function  Total Protein 6.0 - 8.3 g/dL 6.9  7.1  7.5   Albumin 3.5 - 5.2 g/dL 4.2  4.2  3.9   AST 0 - 37 U/L 14  15  21    ALT 0 -  35 U/L 12  12  20    Alk Phosphatase 39 - 117 U/L 97  85  82   Total Bilirubin 0.2 - 1.2 mg/dL 0.5  0.4  0.3   Bilirubin, Direct 0.0 - 0.3 mg/dL 0.1  0.0      Lab Results  Component Value Date/Time   TSH 1.93 12/28/2021 12:29 PM   TSH 2.16 03/17/2021 02:07 PM       Latest Ref Rng & Units 12/28/2021   12:29 PM 03/17/2021    2:07 PM 01/27/2021    5:57 PM  CBC  WBC 4.0 - 10.5 K/uL 6.6  6.6  7.0   Hemoglobin 12.0 - 15.0 g/dL 12.8  12.5  13.1   Hematocrit 36.0 - 46.0 % 38.7  37.2  39.9   Platelets 150.0 - 400.0 K/uL 249.0  247.0  268     No results found for: "VD25OH", "VITAMINB12"  Clinical ASCVD: No  The ASCVD Risk score (Arnett DK, et al., 2019) failed to calculate for the  following reasons:   The 2019 ASCVD risk score is only valid for ages 7 to 62        10/05/2022   11:41 AM 12/28/2021   12:08 PM 08/16/2021    1:56 PM  Depression screen PHQ 2/9  Decreased Interest 0 0 0  Down, Depressed, Hopeless 0 0 0  PHQ - 2 Score 0 0 0  Altered sleeping 0 0   Tired, decreased energy 0 0   Change in appetite 0 0   Feeling bad or failure about yourself  0 0   Trouble concentrating 0 0   Moving slowly or fidgety/restless 0 0   Suicidal thoughts 0 0   PHQ-9 Score 0 0   Difficult doing work/chores Not difficult at all Not difficult at all      Social History   Tobacco Use  Smoking Status Never  Smokeless Tobacco Never   BP Readings from Last 3 Encounters:  01/23/23 (!) 140/66  01/08/23 (!) 140/60  12/14/22 136/84   Pulse Readings from Last 3 Encounters:  01/23/23 78  01/08/23 96  12/12/22 82   Wt Readings from Last 3 Encounters:  01/23/23 217 lb 6.4 oz (98.6 kg)  01/08/23 211 lb 9.6 oz (96 kg)  12/14/22 210 lb (95.3 kg)   BMI Readings from Last 3 Encounters:  01/23/23 42.46 kg/m  01/08/23 41.33 kg/m  12/14/22 41.01 kg/m    Allergies  Allergen Reactions   Codeine Nausea And Vomiting    Medications Reviewed Today     Reviewed by Edythe Clarity, [facility] (Pharmacist) on 02/08/23 at 1244  Med List Status: <None>   Medication Order Taking? Sig Documenting Provider Last Dose Status Informant  amLODipine (NORVASC) 10 MG tablet [id number] Yes Take 1 tablet (10 mg total) by mouth daily. Midge Minium, MD Taking Active   Ascorbic Acid (VITAMIN C PO) [id number] Yes Take by mouth. [provider] Taking Active   aspirin 81 MG tablet [id number] Yes Take 81 mg by mouth every evening.  [provider] Taking Active Self  Galcanezumab-gnlm [facility]) 120 MG/ML Darden Palmer [id number] Yes Inject 120 mg into the skin every 30 (thirty) days. Lomax, Amy, NP Taking Active   ibuprofen (ADVIL) 800 MG tablet [id number] Yes TAKE ONE TABLET BY  MOUTH EVERY 8 HOURS AS NEEDED FOR HEADACHE Midge Minium, MD Taking Active   Magnesium 250 MG TABS [id number] Yes Take 250 mg by mouth as needed. [provider] Taking Active  multivitamin-lutein (OCUVITE-LUTEIN) CAPS capsule [id number] Yes Take 1 capsule by mouth daily. [provider] Taking Active Self  omeprazole (PRILOSEC) 20 MG capsule [id number] Yes Take 1 capsule (20 mg total) by mouth daily.  Patient taking differently: Take 20 mg by mouth as needed.   Midge Minium, MD Taking Active   Rimegepant Sulfate (NURTEC) 75 MG TBDP [id number] Yes Take 1 tablet (75 mg total) by mouth daily as needed (take for abortive therapy of migraine, no more than 1 tablet in 24 hours or 8 per month). Lomax, Amy, NP Taking Active   traMADol (ULTRAM) 50 MG tablet [id number] Yes Take 50 mg by mouth every 6 (six) hours as needed. [provider] Taking Active   vitamin B-12 (CYANOCOBALAMIN) 100 MCG tablet [id number] Yes Take 100 mcg by mouth daily. [provider] Taking Active Self            SDOH:  (Social Determinants of Health) assessments and interventions performed: No, done within the year Financial Resource Strain: Low Risk  (10/05/2022)   Overall Financial Resource Strain (CARDIA)    Difficulty of Paying Living Expenses: Not hard at all   Food Insecurity: No Food Insecurity (10/05/2022)   Hunger Vital Sign    Worried About Running Out of Food in the Last Year: Never true    Ran Out of Food in the Last Year: Never true    SDOH Interventions    Flowsheet Row Clinical Support from 10/05/2022 in [place] at [facility] [facility] from 07/20/2021 in [place] at [place] Interventions    Food Insecurity Interventions Intervention Not Indicated Intervention Not Indicated  Housing Interventions Intervention Not Indicated Intervention Not Indicated  Transportation  Interventions Intervention Not Indicated Intervention Not Indicated  Utilities Interventions Intervention Not Indicated --  Alcohol Usage Interventions Intervention Not Indicated (Score <7) --  Financial Strain Interventions Intervention Not Indicated Intervention Not Indicated  Physical Activity Interventions Intervention Not Indicated --  Stress Interventions Intervention Not Indicated Intervention Not Indicated  Social Connections Interventions Intervention Not Indicated --       Medication Assistance: None required.  Patient affirms current coverage meets needs.  Medication Access: Within the past 30 days, how often has patient missed a dose of medication? 0 Is a pillbox or other method used to improve adherence? Yes  Factors that may affect medication adherence? transportation problems Are meds synced by current pharmacy? No  Are meds delivered by current pharmacy? No  Does patient experience delays in picking up medications due to transportation concerns? No   [facility] Services Reviewed: Is patient disadvantaged to use [facility] Pharmacy?: Yes  Current Rx insurance plan: [place] Name and location of Current pharmacy:  [id number] - [place] [place] [address] Phone: [phone] Fax: [phone]  [facility] Pharm[facility] services reviewed with patient today?: Yes  Patient requests to transfer care to [facility] Pharmacy?: No  Reason patient declined to change pharmacies: Disadvantaged due to insurance/mail order  Compliance/Adherence/Medication fill history: Care Gaps: Mammogram  Star-Rating Drugs: None   Assessment/Plan    Current Barriers:  Unable to independently afford treatment regimen Unable to achieve control of blood pressure   Pharmacist Clinical Goal(s):  Patient will verbalize ability to afford treatment regimen achieve improvement in BP as evidenced by home monitoring through  collaboration with PharmD and provider.   Interventions: 1:1 collaboration with Midge Minium, MD  regarding development and update of comprehensive plan of care as evidenced by provider attestation and co-signature Inter-disciplinary care team collaboration (see longitudinal plan of care) Comprehensive medication review performed; medication list updated in electronic medical record  Hypertension (BP goal <140/90) 02/07/23 -Controlled based on her home readings -Current treatment: Amlodipine 10mg  Appropriate, Effective, Safe, Accessible -Medications previously tried: Benazepril (cough) -Current home readings: [id number] systolic [id number] systolic. -Denies hypotensive/hypertensive symptoms -Educated on BP goals and benefits of medications for prevention of heart attack, stroke and kidney damage; Importance of home blood pressure monitoring; Proper BP monitoring technique; Symptoms of hypotension and importance of maintaining adequate hydration; Bp still controlled - no changes needed at this time. Denies any swelling or concerns with dizziness or falls.  Update 01/18/22 Has not  been checking her BP at home due to busy week. Recently stopped benazepril due to cough she was having.  She resumed amlodipine 10mg  but has not been checking her BP at home.  Discussed importance of BP monitoring.  Patient agrees to start checking more and recording up to twice daily. Will FU in a week or so to see how BP is doing. No changes at this time.  Osteoarthritis (Goal: Minimize pain) -Controlled, not assessed -Current treatment  IBU 800mg  q8h prn -Medications previously tried: Tramadol -Patient mentions she often has pain from osteoarthritis.  She does not like to take a lot of medication.  She has requested a refill of Tramadol to be sent to [facility].  I believe this was last filled at an urgent care type clinic.  -Recommended to continue current medication  GERD (Goal: Minimize  symptoms) -Controlled -Current treatment  Omeprazole 20mg  - 10 year history of med  -Medications previously tried: none noted -Rarely uses this medication - denies any symptoms of acid reflux recently  -Recommended she continue to watch trigger foods.  No need to taper since she has not taken consistently. -I believe she would benefit from DEXA scan, she does not feel at her age she would want to do this.  Migraine w/ Aura (Goal: Reduce occurrence) 02/07/23 -Controlled -Current treatment  Emgality 120mg  every 30 days Appropriate, Effective, Safe, Accessible Nurtec 75mg  prn Appropriate, Effective, Safe, Accessible -Medications previously tried: Nurtec -Migraines have been consistently controlled.  Continues on same medications.  Copay still high for Emgality but she has not qualified for PAP in the past. Continue on current treatment.  I will check in to pharmacy for you. Patient reports that she may need delivery set up with pharmacy.  BCBS medicare advantage  Update 01/18/22 She reports she was approved for [employer] through [employer].  However, she has not gotten medication for free yet.  She was given instructions on calling [employer] to follow up on application.  Medication is always mailed to the patient's house she will not pick up at pharmacy.  She should not have to pay to pick up if already approved. Encouraged her to call me if she continues to have issues with the cost of this. No changes at this time.          Beverly Milch, PharmD Clinical Pharmacist  [facility] [phone]

## 2023-02-09 ENCOUNTER — Encounter: Payer: Self-pay | Admitting: Family Medicine

## 2023-02-09 ENCOUNTER — Ambulatory Visit (INDEPENDENT_AMBULATORY_CARE_PROVIDER_SITE_OTHER): Payer: Medicare Other | Admitting: Family Medicine

## 2023-02-09 VITALS — BP 132/60 | HR 80 | Temp 97.7°F | Resp 18 | Ht 60.0 in | Wt 218.0 lb

## 2023-02-09 DIAGNOSIS — Z0001 Encounter for general adult medical examination with abnormal findings: Secondary | ICD-10-CM | POA: Diagnosis not present

## 2023-02-09 DIAGNOSIS — Z0000 Encounter for general adult medical examination without abnormal findings: Secondary | ICD-10-CM

## 2023-02-09 NOTE — Patient Instructions (Addendum)
Follow up in 6 months to recheck blood pressure We'll notify you of your lab results and make any changes if needed Continue to work on healthy diet and regular physical and mental activity Call with any questions or concerns Stay Safe!  Stay Healthy! Happy Spring!!!

## 2023-02-09 NOTE — Progress Notes (Unsigned)
   Subjective:    Patient ID: Ann Conley, female    DOB: [birth year], 89 y.o.   MRN: [id number]  HPI CPE- pt has stopped doing mammograms.  UTD on DEXA.  UTD on immunizations.  Patient Care Team    Relationship Specialty Notifications Start End  Sheliah Hatch, MD PCP - General Family Medicine  02/27/12    Comment: Despina Hick, Delton See, MD Consulting Physician Cardiology  09/11/19   Suanne Marker, MD Consulting Physician Neurology  09/11/19   Nelson Chimes, MD Consulting Physician Ophthalmology  09/11/19   Erroll Luna, [facility]  Pharmacist  10/19/21    Comment: [phone]     Health Maintenance  Topic Date Due   DEXA SCAN  10/06/2023 (Originally 10/14/2001)   Medicare Annual Wellness (AWV)  10/06/2023   DTaP/Tdap/Td (2 - Td or Tdap) 09/01/2026   Pneumonia Vaccine 48+ Years old  Completed   HPV VACCINES  Aged Out   INFLUENZA VACCINE  Discontinued   COVID-19 Vaccine  Discontinued   Zoster Vaccines- Shingrix  Discontinued       Review of Systems Patient reports no vision/ hearing changes, adenopathy,fever, weight change,  persistant/recurrent hoarseness , swallowing issues, chest pain, palpitations, edema, persistant/recurrent cough, hemoptysis, dyspnea (rest/exertional/paroxysmal nocturnal), gastrointestinal bleeding (melena, rectal bleeding), abdominal pain, significant heartburn, bowel changes, GU symptoms (dysuria, hematuria, incontinence), Gyn symptoms (abnormal  bleeding, pain),  syncope, focal weakness, memory loss, numbness & tingling, skin/hair/nail changes, abnormal bruising or bleeding, anxiety, or depression.     Objective:   Physical Exam General Appearance:    Alert, cooperative, no distress, appears stated age, obese  Head:    Normocephalic, without obvious abnormality, atraumatic  Eyes:    PERRL, conjunctiva/corneas clear, EOM's intact both eyes  Ears:    Normal TM's and external ear canals, both ears  Nose:   Nares normal, septum midline,  mucosa normal, no drainage    or sinus tenderness  Throat:   Lips, mucosa, and tongue normal; teeth and gums normal  Neck:   Supple, symmetrical, trachea midline, no adenopathy;    Thyroid: no enlargement/tenderness/nodules  Back:     Symmetric, no curvature, ROM normal, no CVA tenderness  Lungs:     Clear to auscultation bilaterally, respirations unlabored  Chest Wall:    No tenderness or deformity   Heart:    Regular rate and rhythm, S1 and S2 normal, no murmur, rub   or gallop  Breast Exam:    Deferred  Abdomen:     Soft, non-tender, bowel sounds active all four quadrants,    no masses, no organomegaly  Genitalia:    Deferred  Rectal:    Extremities:   Extremities normal, atraumatic, no cyanosis or edema  Pulses:   2+ and symmetric all extremities  Skin:   Skin color, texture, turgor normal, no rashes or lesions  Lymph nodes:   Cervical, supraclavicular, and axillary nodes normal  Neurologic:   CNII-XII intact, normal strength, sensation and reflexes    throughout          Assessment & Plan:

## 2023-02-10 LAB — BASIC METABOLIC PANEL
BUN/Creatinine Ratio: 38 (calc) — ABNORMAL HIGH (ref 6–22)
BUN: 21 mg/dL (ref 7–25)
CO2: 26 mmol/L (ref 20–32)
Calcium: 9.6 mg/dL (ref 8.6–10.4)
Chloride: 101 mmol/L (ref 98–110)
Creat: 0.56 mg/dL — ABNORMAL LOW (ref 0.60–0.95)
Glucose, Bld: 111 mg/dL — ABNORMAL HIGH (ref 65–99)
Potassium: 5 mmol/L (ref 3.5–5.3)
Sodium: 138 mmol/L (ref 135–146)

## 2023-02-10 LAB — LIPID PANEL
Cholesterol: 227 mg/dL — ABNORMAL HIGH (ref <200)
HDL: 80 mg/dL (ref >=50)
LDL Cholesterol (Calc): 120 mg/dL (calc) — ABNORMAL HIGH
Non-HDL Cholesterol (Calc): 147 mg/dL (calc) — ABNORMAL HIGH (ref <130)
Total CHOL/HDL Ratio: 2.8 (calc) (ref <5.0)
Triglycerides: 159 mg/dL — ABNORMAL HIGH (ref <150)

## 2023-02-10 LAB — CBC WITH DIFFERENTIAL/PLATELET
Absolute Monocytes: 569 cells/uL (ref 200–950)
Basophils Absolute: 51 cells/uL (ref 0–200)
Basophils Relative: 0.7 %
Eosinophils Absolute: 146 cells/uL (ref 15–500)
Eosinophils Relative: 2 %
HCT: 39.3 % (ref 35.0–45.0)
Hemoglobin: 13 g/dL (ref 11.7–15.5)
Lymphs Abs: 1628 cells/uL (ref 850–3900)
MCH: 28.1 pg (ref 27.0–33.0)
MCHC: 33.1 g/dL (ref 32.0–36.0)
MCV: 84.9 fL (ref 80.0–100.0)
MPV: 11 fL (ref 7.5–12.5)
Monocytes Relative: 7.8 %
Neutro Abs: 4906 cells/uL (ref 1500–7800)
Neutrophils Relative %: 67.2 %
Platelets: 332 10*3/uL (ref 140–400)
RBC: 4.63 10*6/uL (ref 3.80–5.10)
RDW: 13.7 % (ref 11.0–15.0)
Total Lymphocyte: 22.3 %
WBC: 7.3 10*3/uL (ref 3.8–10.8)

## 2023-02-10 LAB — HEPATIC FUNCTION PANEL
AG Ratio: 1.7 (calc) (ref 1.0–2.5)
ALT: 13 U/L (ref 6–29)
AST: 14 U/L (ref 10–35)
Albumin: 4.4 g/dL (ref 3.6–5.1)
Alkaline phosphatase (APISO): 119 U/L (ref 37–153)
Bilirubin, Direct: 0.1 mg/dL (ref 0.0–0.2)
Globulin: 2.6 g/dL (calc) (ref 1.9–3.7)
Indirect Bilirubin: 0.3 mg/dL (calc) (ref 0.2–1.2)
Total Bilirubin: 0.4 mg/dL (ref 0.2–1.2)
Total Protein: 7 g/dL (ref 6.1–8.1)

## 2023-02-10 LAB — TSH: TSH: 2.1 mIU/L (ref 0.40–4.50)

## 2023-02-11 NOTE — Assessment & Plan Note (Signed)
Ongoing issue for pt.  Current BMI 42.58  Stressed need for healthy diet and pt is no longer able to exercise- ambulates w/ a cane.  Check labs to risk stratify.  Will follow.

## 2023-02-11 NOTE — Assessment & Plan Note (Signed)
Pt's PE WNL w/ exception of BMI.  UTD on immunizations and DEXA.  Check labs.  Anticipatory guidance provided.

## 2023-02-12 ENCOUNTER — Telehealth: Payer: Self-pay

## 2023-02-12 ENCOUNTER — Other Ambulatory Visit: Payer: Self-pay | Admitting: Family Medicine

## 2023-02-12 MED ORDER — TRAMADOL HCL 50 MG PO TABS: 50.0000 mg | ORAL_TABLET | Freq: Four times a day (QID) | ORAL | 0 refills | Status: DC | PRN

## 2023-02-12 NOTE — Addendum Note (Signed)
Addended by: Eldred Manges on: 02/12/2023 10:30 AM   Modules accepted: Orders

## 2023-02-12 NOTE — Telephone Encounter (Signed)
Patient is requesting a refill of the following medications: Requested Prescriptions   Pending Prescriptions Disp Refills   traMADol (ULTRAM) 50 MG tablet 30 tablet 0    Sig: Take 1 tablet (50 mg total) by mouth every 6 (six) hours as needed.    Date of patient request: 02/12/23 Last office visit: 02/09/23 Date of last refill: unknown Last refill amount: 30 Follow up time period per chart: 6 months

## 2023-02-12 NOTE — Telephone Encounter (Signed)
Encourage patient to contact the pharmacy for refills or they can request refills through [facility]  (Please schedule appointment if patient has not been seen in over a year)    WHAT PHARMACY WOULD THEY LIKE THIS SENT TO:  [facility] [id number] - [place], [place] - [address]    MEDICATION NAME & DOSE:traMADol (ULTRAM) 50 MG tablet   NOTES/COMMENTS FROM PATIENT: patient wants know if Dr.[name] can dispense  90 days because [facility] don't do mail order. It would be a lot easier on Mrs.Landis Gandy.      Front office please notify patient: It takes 48-72 hours to process rx refill requests Ask patient to call pharmacy to ensure rx is ready before heading there.

## 2023-02-12 NOTE — Telephone Encounter (Signed)
Left lab results on pt VM 

## 2023-02-12 NOTE — Telephone Encounter (Signed)
-----   Message from Sheliah Hatch, MD sent at 02/12/2023  7:47 AM EDT ----- Total cholesterol has increased but the ratio of good to bad cholesterol remains excellent!  Remainder of labs look great!  No changes at this time

## 2023-02-13 NOTE — Telephone Encounter (Signed)
Called patient and LM to inform her the prescription has been refilled

## 2023-02-21 ENCOUNTER — Telehealth: Payer: Self-pay | Admitting: *Deleted

## 2023-02-21 DIAGNOSIS — M1712 Unilateral primary osteoarthritis, left knee: Secondary | ICD-10-CM | POA: Diagnosis not present

## 2023-02-21 NOTE — Telephone Encounter (Signed)
Received fax from [facility] that PA approved until 02/21/24.

## 2023-02-21 NOTE — Telephone Encounter (Signed)
Submitted PA Nurtec on covermymeds. Key: [id number]. Waiting on determination from [facility].

## 2023-02-22 ENCOUNTER — Encounter: Payer: Self-pay | Admitting: *Deleted

## 2023-03-21 DIAGNOSIS — M1712 Unilateral primary osteoarthritis, left knee: Secondary | ICD-10-CM | POA: Diagnosis not present

## 2023-03-27 ENCOUNTER — Other Ambulatory Visit (HOSPITAL_COMMUNITY): Payer: Self-pay

## 2023-03-30 ENCOUNTER — Other Ambulatory Visit: Payer: Self-pay

## 2023-03-30 ENCOUNTER — Other Ambulatory Visit (HOSPITAL_COMMUNITY): Payer: Self-pay

## 2023-03-30 DIAGNOSIS — K219 Gastro-esophageal reflux disease without esophagitis: Secondary | ICD-10-CM

## 2023-03-30 MED ORDER — OMEPRAZOLE 20 MG PO CPDR
20.0000 mg | DELAYED_RELEASE_CAPSULE | Freq: Every day | ORAL | 1 refills | Status: DC
Start: 2023-03-30 — End: 2023-07-18

## 2023-04-03 ENCOUNTER — Other Ambulatory Visit: Payer: Self-pay

## 2023-04-03 ENCOUNTER — Other Ambulatory Visit (HOSPITAL_COMMUNITY): Payer: Self-pay

## 2023-04-05 ENCOUNTER — Other Ambulatory Visit: Payer: Self-pay

## 2023-04-06 ENCOUNTER — Other Ambulatory Visit: Payer: Self-pay

## 2023-04-09 ENCOUNTER — Other Ambulatory Visit: Payer: Self-pay

## 2023-04-09 ENCOUNTER — Encounter: Payer: Self-pay | Admitting: Pharmacist

## 2023-04-10 ENCOUNTER — Other Ambulatory Visit (HOSPITAL_COMMUNITY): Payer: Self-pay

## 2023-04-10 ENCOUNTER — Other Ambulatory Visit: Payer: Self-pay

## 2023-04-23 DIAGNOSIS — K080 Exfoliation of teeth due to systemic causes: Secondary | ICD-10-CM | POA: Diagnosis not present

## 2023-04-30 ENCOUNTER — Other Ambulatory Visit (HOSPITAL_COMMUNITY): Payer: Self-pay

## 2023-05-01 DIAGNOSIS — M1712 Unilateral primary osteoarthritis, left knee: Secondary | ICD-10-CM | POA: Diagnosis not present

## 2023-05-02 ENCOUNTER — Other Ambulatory Visit: Payer: Self-pay

## 2023-05-02 ENCOUNTER — Other Ambulatory Visit (HOSPITAL_COMMUNITY): Payer: Self-pay

## 2023-05-04 ENCOUNTER — Other Ambulatory Visit (HOSPITAL_COMMUNITY): Payer: Self-pay

## 2023-05-04 ENCOUNTER — Other Ambulatory Visit: Payer: Self-pay

## 2023-05-07 DIAGNOSIS — Z6841 Body Mass Index (BMI) 40.0 and over, adult: Secondary | ICD-10-CM | POA: Diagnosis not present

## 2023-05-26 ENCOUNTER — Other Ambulatory Visit (HOSPITAL_COMMUNITY): Payer: Self-pay

## 2023-05-28 ENCOUNTER — Other Ambulatory Visit: Payer: Self-pay

## 2023-06-04 ENCOUNTER — Telehealth: Payer: Self-pay | Admitting: Family Medicine

## 2023-06-05 ENCOUNTER — Ambulatory Visit: Payer: Medicare Other | Admitting: Family Medicine

## 2023-06-30 ENCOUNTER — Other Ambulatory Visit (HOSPITAL_COMMUNITY): Payer: Self-pay

## 2023-07-02 ENCOUNTER — Other Ambulatory Visit (HOSPITAL_COMMUNITY): Payer: Self-pay

## 2023-07-18 ENCOUNTER — Other Ambulatory Visit: Payer: Self-pay | Admitting: Family Medicine

## 2023-07-18 ENCOUNTER — Other Ambulatory Visit (HOSPITAL_COMMUNITY): Payer: Self-pay

## 2023-07-18 DIAGNOSIS — G43109 Migraine with aura, not intractable, without status migrainosus: Secondary | ICD-10-CM

## 2023-07-18 DIAGNOSIS — K219 Gastro-esophageal reflux disease without esophagitis: Secondary | ICD-10-CM

## 2023-07-18 DIAGNOSIS — I10 Essential (primary) hypertension: Secondary | ICD-10-CM

## 2023-07-19 ENCOUNTER — Other Ambulatory Visit (HOSPITAL_COMMUNITY): Payer: Self-pay

## 2023-07-19 MED ORDER — OMEPRAZOLE 20 MG PO CPDR
20.0000 mg | DELAYED_RELEASE_CAPSULE | Freq: Every day | ORAL | 1 refills | Status: DC
Start: 2023-07-19 — End: 2024-02-11
  Filled 2023-07-19: qty 90, 90d supply, fill #0
  Filled 2023-11-19: qty 90, 90d supply, fill #1

## 2023-07-19 MED ORDER — IBUPROFEN 800 MG PO TABS
800.0000 mg | ORAL_TABLET | Freq: Three times a day (TID) | ORAL | 0 refills | Status: DC | PRN
Start: 2023-07-19 — End: 2023-08-29
  Filled 2023-07-19: qty 30, 10d supply, fill #0

## 2023-07-19 MED ORDER — AMLODIPINE BESYLATE 10 MG PO TABS
10.0000 mg | ORAL_TABLET | Freq: Every day | ORAL | 3 refills | Status: DC
Start: 2023-07-19 — End: 2024-06-23
  Filled 2023-07-19: qty 90, 90d supply, fill #0
  Filled 2023-11-19: qty 90, 90d supply, fill #1
  Filled 2024-02-11: qty 90, 90d supply, fill #2
  Filled 2024-05-12: qty 90, 90d supply, fill #3

## 2023-07-27 ENCOUNTER — Other Ambulatory Visit (HOSPITAL_COMMUNITY): Payer: Self-pay

## 2023-07-30 ENCOUNTER — Other Ambulatory Visit (HOSPITAL_COMMUNITY): Payer: Self-pay

## 2023-08-13 ENCOUNTER — Encounter: Payer: Self-pay | Admitting: Family Medicine

## 2023-08-13 ENCOUNTER — Other Ambulatory Visit: Payer: Self-pay

## 2023-08-13 ENCOUNTER — Ambulatory Visit (INDEPENDENT_AMBULATORY_CARE_PROVIDER_SITE_OTHER): Payer: Medicare Other | Admitting: Family Medicine

## 2023-08-13 ENCOUNTER — Telehealth: Payer: Self-pay | Admitting: Family Medicine

## 2023-08-13 VITALS — BP 128/78 | HR 88 | Temp 98.0°F | Ht 60.0 in | Wt 222.6 lb

## 2023-08-13 DIAGNOSIS — I10 Essential (primary) hypertension: Secondary | ICD-10-CM | POA: Diagnosis not present

## 2023-08-13 DIAGNOSIS — M170 Bilateral primary osteoarthritis of knee: Secondary | ICD-10-CM

## 2023-08-13 MED ORDER — TRAMADOL HCL 50 MG PO TABS
50.0000 mg | ORAL_TABLET | Freq: Four times a day (QID) | ORAL | 0 refills | Status: DC | PRN
  Filled 2023-08-13: qty 28, 7d supply, fill #0

## 2023-08-13 NOTE — Telephone Encounter (Signed)
Patient informed and states she will call them here shortly

## 2023-08-13 NOTE — Telephone Encounter (Signed)
Pt is scheduled today (10/7) at 2:00 for blood pressure f/u.  In the appt notes it also mentions migraines w/ aura.  She has a neurologist that she sees for this and she should make contact w/ them regarding this particular issue.

## 2023-08-13 NOTE — Progress Notes (Signed)
   Subjective:    Patient ID: COREE GATTI, female    DOB: 02-14-1934, 89 y.o.   MRN: [id number]  HPI HTN- chronic problem, on Amlodipine 10mg  daily.  Denies CP, SOB, visual changes, edema.  Obesity- pt has gained 5 lbs since last visit.  BMI now 43.47  Minimal physical activity- walks w/ a cane.  Not following a particular diet.  Knee arthritis- bilateral.  Pt is asking for Tramadol refill to use prn.  Declines flu shot, shingles, COVID vaccines.   Review of Systems For ROS see HPI     Objective:   Physical Exam Vitals reviewed.  Constitutional:      General: She is not in acute distress.    Appearance: Normal appearance. She is well-developed. She is obese. She is not ill-appearing.  HENT:     Head: Normocephalic and atraumatic.  Eyes:     Extraocular Movements: Extraocular movements intact.     Conjunctiva/sclera: Conjunctivae normal.  Neck:     Thyroid: No thyromegaly.  Cardiovascular:     Rate and Rhythm: Normal rate and regular rhythm.     Heart sounds: Normal heart sounds. No murmur heard. Pulmonary:     Effort: Pulmonary effort is normal. No respiratory distress.     Breath sounds: Normal breath sounds.  Abdominal:     General: There is no distension.     Palpations: Abdomen is soft.     Tenderness: There is no abdominal tenderness.  Musculoskeletal:     Cervical back: Normal range of motion and neck supple.  Lymphadenopathy:     Cervical: No cervical adenopathy.  Skin:    General: Skin is warm and dry.  Neurological:     Mental Status: She is alert and oriented to person, place, and time. Mental status is at baseline.  Psychiatric:        Mood and Affect: Mood normal.        Behavior: Behavior normal.           Assessment & Plan:

## 2023-08-13 NOTE — Patient Instructions (Signed)
Schedule your complete physical in 6 months We'll notify you of your lab results and make any changes if needed Continue to work on healthy diet and physical activity as you are able Continue to drink lots of water Use the Tramadol as needed for pain Call with any questions or concerns Stay Safe!  Stay Healthy! Happy Fall!!!

## 2023-08-16 ENCOUNTER — Telehealth: Payer: Self-pay | Admitting: Family Medicine

## 2023-08-16 ENCOUNTER — Other Ambulatory Visit (HOSPITAL_COMMUNITY): Payer: Self-pay

## 2023-08-16 MED ORDER — NURTEC 75 MG PO TBDP: ORAL_TABLET | ORAL | 0 refills | Status: DC

## 2023-08-16 NOTE — Telephone Encounter (Signed)
Last seen 01/2023. Has yearly f/u 01/2024. She had a more severe migraine last night. Took Ibuprofen 800mg  and it was beneficial. Has no pain today but more tired after episode.  Due for Emgality injection 09/03/23. Last injection: 08/04/23. Cost 170.00. Only takes Nurtec when she has pain with migraine. If she only has vision changes, no pain, she does not take Nurtec d/t high cost of Nurtec. Only gets 2 Nurtec at a time at pharmacy. Cost too high to get all 8 prescribed in a month. Fills at [facility].  She saw PCP a couple days ago who recommended she contact Amy Lomax,NP d/t reporting her ice pick headaches have changed in nature. Previously, they were located a top of head on right side. Now, located on the right side below her chin in neck area. In the last month she has had about 2-3 migraines per week. Not debilitating but unable to do normal activities d/t vision changes. Lasts about 2 hr. Naps help. Has had about 4-5 ice pick headaches in the last month. Typically occurs at nighttime. Lasts 1-2 seconds.    I called [facility] outpt pharmacy. Spoke w/ Arlys John.  Emgality: Filled 07/31/23, Cost: 167.49. Was in coverage gap back 04/2023 as well. Has been paying this price since. Nurtec: Filled 07/30/23, 07/02/23. Cost: 59.28 for qty 2. She is in coverage gap right now with insurance Rx transferred from [employer]

## 2023-08-16 NOTE — Telephone Encounter (Signed)
Per Amy " Any shot we have a sample we could provide of either Emgality or Nurtec? "   No samples of Emgality, but we did have Nurtec samples. Left for pt to pick up at check in.

## 2023-08-16 NOTE — Telephone Encounter (Signed)
Pt said, had a migraine last night and recovery from it today. Last night around 7 pm ook 1 tablet 800mg  ibuprofen then went in the bedroom where it was dark. Would like a call from the nurse to discuss if could get a work in appt.

## 2023-08-17 ENCOUNTER — Other Ambulatory Visit: Payer: Medicare Other

## 2023-08-17 DIAGNOSIS — I10 Essential (primary) hypertension: Secondary | ICD-10-CM | POA: Diagnosis not present

## 2023-08-17 LAB — LIPID PANEL
Cholesterol: 201 mg/dL — ABNORMAL HIGH (ref 0–200)
HDL: 75.6 mg/dL (ref >39.00)
LDL Cholesterol: 94 mg/dL (ref 0–99)
NonHDL: 125.39
Total CHOL/HDL Ratio: 3
Triglycerides: 158 mg/dL — ABNORMAL HIGH (ref 0.0–149.0)
VLDL: 31.6 mg/dL (ref 0.0–40.0)

## 2023-08-17 LAB — CBC WITH DIFFERENTIAL/PLATELET
Basophils Absolute: 0.1 10*3/uL (ref 0.0–0.1)
Basophils Relative: 0.7 % (ref 0.0–3.0)
Eosinophils Absolute: 0.1 10*3/uL (ref 0.0–0.7)
Eosinophils Relative: 2.1 % (ref 0.0–5.0)
HCT: 40.3 % (ref 36.0–46.0)
Hemoglobin: 12.9 g/dL (ref 12.0–15.0)
Lymphocytes Relative: 21.6 % (ref 12.0–46.0)
Lymphs Abs: 1.5 10*3/uL (ref 0.7–4.0)
MCHC: 32.1 g/dL (ref 30.0–36.0)
MCV: 87 fL (ref 78.0–100.0)
Monocytes Absolute: 0.5 10*3/uL (ref 0.1–1.0)
Monocytes Relative: 6.8 % (ref 3.0–12.0)
Neutro Abs: 4.9 10*3/uL (ref 1.4–7.7)
Neutrophils Relative %: 68.8 % (ref 43.0–77.0)
Platelets: 265 10*3/uL (ref 150.0–400.0)
RBC: 4.63 Mil/uL (ref 3.87–5.11)
RDW: 14.4 % (ref 11.5–15.5)
WBC: 7.1 10*3/uL (ref 4.0–10.5)

## 2023-08-17 LAB — BASIC METABOLIC PANEL
BUN: 16 mg/dL (ref 6–23)
CO2: 30 meq/L (ref 19–32)
Calcium: 9.6 mg/dL (ref 8.4–10.5)
Chloride: 100 meq/L (ref 96–112)
Creatinine, Ser: 0.76 mg/dL (ref 0.40–1.20)
GFR: 70.74 mL/min (ref >60.00)
Glucose, Bld: 112 mg/dL — ABNORMAL HIGH (ref 70–99)
Potassium: 4.1 meq/L (ref 3.5–5.1)
Sodium: 140 meq/L (ref 135–145)

## 2023-08-17 LAB — HEPATIC FUNCTION PANEL
ALT: 11 U/L (ref 0–35)
AST: 13 U/L (ref 0–37)
Albumin: 4.3 g/dL (ref 3.5–5.2)
Alkaline Phosphatase: 102 U/L (ref 39–117)
Bilirubin, Direct: 0.1 mg/dL (ref 0.0–0.3)
Total Bilirubin: 0.4 mg/dL (ref 0.2–1.2)
Total Protein: 6.4 g/dL (ref 6.0–8.3)

## 2023-08-17 LAB — TSH: TSH: 3.57 u[IU]/mL (ref 0.35–5.50)

## 2023-08-21 ENCOUNTER — Telehealth: Payer: Self-pay

## 2023-08-21 NOTE — Telephone Encounter (Signed)
-----   Message from Neena Rhymes sent at 08/20/2023  8:10 PM EDT ----- Labs look good!  No changes at this time

## 2023-08-21 NOTE — Telephone Encounter (Signed)
Spoke to patient and she is aware of her lab results. No questions at this time.

## 2023-08-23 DIAGNOSIS — K080 Exfoliation of teeth due to systemic causes: Secondary | ICD-10-CM | POA: Diagnosis not present

## 2023-08-29 ENCOUNTER — Other Ambulatory Visit (HOSPITAL_COMMUNITY): Payer: Self-pay

## 2023-08-29 ENCOUNTER — Other Ambulatory Visit: Payer: Self-pay | Admitting: Family Medicine

## 2023-08-29 ENCOUNTER — Other Ambulatory Visit: Payer: Self-pay

## 2023-08-29 ENCOUNTER — Encounter: Payer: Medicare Other | Admitting: Pharmacist

## 2023-08-29 DIAGNOSIS — G43109 Migraine with aura, not intractable, without status migrainosus: Secondary | ICD-10-CM

## 2023-08-29 MED ORDER — IBUPROFEN 800 MG PO TABS
800.0000 mg | ORAL_TABLET | Freq: Three times a day (TID) | ORAL | 0 refills | Status: DC | PRN
  Filled 2023-08-29: qty 30, 10d supply, fill #0

## 2023-08-30 ENCOUNTER — Other Ambulatory Visit (HOSPITAL_COMMUNITY): Payer: Self-pay

## 2023-08-30 ENCOUNTER — Other Ambulatory Visit: Payer: Self-pay

## 2023-09-06 NOTE — Assessment & Plan Note (Signed)
Ongoing issue.  Pt is asking for Tramadol refill to use as needed for severe pain.  Prescription sent.

## 2023-09-06 NOTE — Assessment & Plan Note (Signed)
Deteriorated.  Pt has gained 5 lbs since last visit and BMI now 43.47.  She is not able to exercise given her knee arthritis and use of a cane.  Not following a particular diet.  Encouraged low carb diet as much as possible.  Check labs to risk stratify.  Will follow.

## 2023-09-06 NOTE — Assessment & Plan Note (Signed)
Chronic problem.  On Amlodipine 10mg  daily w/o difficulty.  Currently asymptomatic.  No med changes at this time.

## 2023-09-17 NOTE — Telephone Encounter (Signed)
Have concerns about symptoms related to migraines, hallucinations, stroke  like symptoms; numbness. Informed pt would need to go to the emergency. Pt said symptoms clear up after a while. Pt insisted on a call from the nurse to discuss.

## 2023-09-17 NOTE — Telephone Encounter (Signed)
Called pt back. Pt reports she is having increased concerns. Does not feel Emgality helping as much. Last injection 09/03/2023. Experiencing headache/nausea again. A few days before 09/03/23 inj, she woke up middle of the night and threw up which is not typical.  Past two weeks, having numbness in hands. Sometimes located on mouth. Sx intermittent. Last night, she was playing cards. Sx lasted for a couple hours (located in hands/arm/elbow). This numbness is not new for her. Feels sx just more severe. Her speech clear, able to communicate ok. Denies any confusion, speech changes, vision changes, no droopiness of face. She has also tried to take ibuprofen and Nurtec.  Aware to proceed to ER if sx becomes more severe, develops any new sx.   Scheduled appt for 09/20/23 at 8am with AL,NP. Asked she check in at 7:45am. Cx 01/2024 appt since she is coming in sooner.

## 2023-09-18 NOTE — Patient Instructions (Incomplete)
Below is our plan:  We will continue Emgality injection every 30 days. We can consider switching to Vyepti infusion if cost or effectiveness of Emgality becomes an issue. Continue Nurtec for now for abortive therapy.   I would like for you to start melatonin 3-5mg  daily at bedtime (about 30 minutes before bedtime)  Update your eye exam asap   Please make sure you are staying well hydrated. I recommend 50-60 ounces daily. Well balanced diet and regular exercise encouraged. Consistent sleep schedule with 6-8 hours recommended.   Please continue follow up with care team as directed.   Follow up with me in 6 months   You may receive a survey regarding today's visit. I encourage you to leave honest feed back as I do use this information to improve patient care. Thank you for seeing me today!   GENERAL HEADACHE INFORMATION:   Natural supplements: Magnesium Oxide or Magnesium Glycinate 500 mg at bed (up to 800 mg daily) Coenzyme Q10 300 mg in AM Vitamin B2- 200 mg twice a day   Add 1 supplement at a time since even natural supplements can have undesirable side effects. You can sometimes buy supplements cheaper (especially Coenzyme Q10) at www.WebmailGuide.co.za or at [facility].  Migraine with aura: There is increased risk for stroke in women with migraine with aura and a contraindication for the combined contraceptive pill for use by women who have migraine with aura. The risk for women with migraine without aura is lower. However other risk factors like smoking are far more likely to increase stroke risk than migraine. There is a recommendation for no smoking and for the use of OCPs without estrogen such as progestogen only pills particularly for women with migraine with aura.Marland Kitchen People who have migraine headaches with auras may be 3 times more likely to have a stroke caused by a blood clot, compared to migraine patients who don't see auras. Women who take hormone-replacement therapy may be 30 percent more  likely to suffer a clot-based stroke than women not taking medication containing estrogen. Other risk factors like smoking and high blood pressure may be  much more important.    Vitamins and herbs that show potential:   Magnesium: Magnesium (250 mg twice a day or 500 mg at bed) has a relaxant effect on smooth muscles such as blood vessels. Individuals suffering from frequent or daily headache usually have low magnesium levels which can be increase with daily supplementation of 400-750 mg. Three trials found 40-90% average headache reduction  when used as a preventative. Magnesium may help with headaches are aura, the best evidence for magnesium is for migraine with aura is its thought to stop the cortical spreading depression we believe is the pathophysiology of migraine aura.Magnesium also demonstrated the benefit in menstrually related migraine.  Magnesium is part of the messenger system in the serotonin cascade and it is a good muscle relaxant.  It is also useful for constipation which can be a side effect of other medications used to treat migraine. Good sources include nuts, whole grains, and tomatoes. Side Effects: loose stool/diarrhea  Riboflavin (vitamin B 2) 200 mg twice a day. This vitamin assists nerve cells in the production of ATP a principal energy storing molecule.  It is necessary for many chemical reactions in the body.  There have been at least 3 clinical trials of riboflavin using 400 mg per day all of which suggested that migraine frequency can be decreased.  All 3 trials showed significant improvement in over half  of migraine sufferers.  The supplement is found in bread, cereal, milk, meat, and poultry.  Most Americans get more riboflavin than the recommended daily allowance, however riboflavin deficiency is not necessary for the supplements to help prevent headache. Side effects: energizing, green urine   Coenzyme Q10: This is present in almost all cells in the body and is critical  component for the conversion of energy.  Recent studies have shown that a nutritional supplement of CoQ10 can reduce the frequency of migraine attacks by improving the energy production of cells as with riboflavin.  Doses of 150 mg twice a day have been shown to be effective.   Melatonin: Increasing evidence shows correlation between melatonin secretion and headache conditions.  Melatonin supplementation has decreased headache intensity and duration.  It is widely used as a sleep aid.  Sleep is natures way of dealing with migraine.  A dose of 3 mg is recommended to start for headaches including cluster headache. Higher doses up to 15 mg has been reviewed for use in Cluster headache and have been used. The rationale behind using melatonin for cluster is that many theories regarding the cause of Cluster headache center around the disruption of the normal circadian rhythm in the brain.  This helps restore the normal circadian rhythm.   HEADACHE DIET: Foods and beverages which may trigger migraine Note that only 20% of headache patients are food sensitive. You will know if you are food sensitive if you get a headache consistently 20 minutes to 2 hours after eating a certain food. Only cut out a food if it causes headaches, otherwise you might remove foods you enjoy! What matters most for diet is to eat a well balanced healthy diet full of vegetables and low fat protein, and to not miss meals.   Chocolate, other sweets ALL cheeses except cottage and cream cheese Dairy products, yogurt, sour cream, ice cream Liver Meat extracts (Bovril, Marmite, meat tenderizers) Meats or fish which have undergone aging, fermenting, pickling or smoking. These include: Hotdogs,salami,Lox,sausage, mortadellas,smoked salmon, pepperoni, Pickled herring Pods of broad bean (English beans, Chinese pea pods, [place] (fava) beans, lima and navy beans Ripe avocado, ripe banana Yeast extracts or active yeast preparations such as  Brewer's or Fleishman's (commercial bakes goods are permitted) Tomato based foods, pizza (lasagna, etc.)   MSG (monosodium glutamate) is disguised as many things; look for these common aliases: Monopotassium glutamate Autolysed yeast Hydrolysed protein Sodium caseinate "flavorings" "all natural preservatives" Nutrasweet   Avoid all other foods that convincingly provoke headaches.   Resources: The Dizzy [place] Laundry Your Headache Diet, migrainestrong.com  https://zamora-andrews.com/   Caffeine and Migraine For patients that have migraine, caffeine intake more than 3 days per week can lead to dependency and increased migraine frequency. I would recommend cutting back on your caffeine intake as best you can. The recommended amount of caffeine is 200-300 mg daily, although migraine patients may experience dependency at even lower doses. While you may notice an increase in headache temporarily, cutting back will be helpful for headaches in the long run. For more information on caffeine and migraine, visit: https://americanmigrainefoundation.org/resource-library/caffeine-and-migraine/   Headache Prevention Strategies:   1. Maintain a headache diary; learn to identify and avoid triggers.  - This can be a simple note where you log when you had a headache, associated symptoms, and medications used - There are several smartphone apps developed to help track migraines: Migraine Buddy, Migraine Monitor, Curelator N1-Headache App   Common triggers include: Emotional triggers: Emotional/Upset family or friends  Emotional/Upset occupation Business reversal/success Anticipation anxiety Crisis-serious Post-crisis periodNew job/position   Physical triggers: Vacation Day Weekend Strenuous Exercise High Altitude Location New Move Menstrual Day Physical Illness Oversleep/Not enough sleep Weather changes Light: Photophobia or light sesnitivity  treatment involves a balance between desensitization and reduction in overly strong input. Use dark polarized glasses outside, but not inside. Avoid bright or fluorescent light, but do not dim environment to the point that going into a normally lit room hurts. Consider FL-41 tint lenses, which reduce the most irritating wavelengths without blocking too much light.  These can be obtained at axonoptics.com or theraspecs.com Foods: see list above.   2. Limit use of acute treatments (over-the-counter medications, triptans, etc.) to no more than 2 days per week or 10 days per month to prevent medication overuse headache (rebound headache).     3. Follow a regular schedule (including weekends and holidays): Don't skip meals. Eat a balanced diet. 8 hours of sleep nightly. Minimize stress. Exercise 30 minutes per day. Being overweight is associated with a 5 times increased risk of chronic migraine. Keep well hydrated and drink 6-8 glasses of water per day.   4. Initiate non-pharmacologic measures at the earliest onset of your headache. Rest and quiet environment. Relax and reduce stress. Breathe2Relax is a free app that can instruct you on    some simple relaxtion and breathing techniques. Http://Dawnbuse.com is a    free website that provides teaching videos on relaxation.  Also, there are  many apps that   can be downloaded for "mindful" relaxation.  An app called YOGA NIDRA will help walk you through mindfulness. Another app called Calm can be downloaded to give you a structured mindfulness guide with daily reminders and skill development. Headspace for guided meditation Mindfulness Based Stress Reduction Online Course: www.palousemindfulness.com Cold compresses.   5. Don't wait!! Take the maximum allowable dosage of prescribed medication at the first sign of migraine.   6. Compliance:  Take prescribed medication regularly as directed and at the first sign of a migraine.   7. Communicate:  Call your  physician when problems arise, especially if your headaches change, increase in frequency/severity, or become associated with neurological symptoms (weakness, numbness, slurred speech, etc.). Proceed to emergency room if you experience new or worsening symptoms or symptoms do not resolve, if you have new neurologic symptoms or if headache is severe, or for any concerning symptom.   8. Headache/pain management therapies: Consider various complementary methods, including medication, behavioral therapy, psychological counselling, biofeedback, massage therapy, acupuncture, dry needling, and other modalities.  Such measures may reduce the need for medications. Counseling for pain management, where patients learn to function and ignore/minimize their pain, seems to work very well.   9. Recommend changing family's attention and focus away from patient's headaches. Instead, emphasize daily activities. If first question of day is 'How are your headaches/Do you have a headache today?', then patient will constantly think about headaches, thus making them worse. Goal is to re-direct attention away from headaches, toward daily activities and other distractions.   10. Helpful Websites: www.AmericanHeadacheSociety.org PatentHood.ch www.headaches.org TightMarket.nl www.achenet.org

## 2023-09-18 NOTE — Progress Notes (Unsigned)
No chief complaint on file.   HISTORY OF PRESENT ILLNESS:  09/18/23 ALL:  Ann Conley returns for follow up for migraines. She was last seen 01/2023 and doing well on Emgality and Nurtec.   She called 08/2023 reporting PCP advised sooner follow up due to change of location of ice pick pain. She was previously having sharp pains in the top right side of her head. Now describing the same sensation on the right side of her face, below chin and in her neck. Pain last 1-2 seconds. Spontaneously relieved without meds.   She continues Emgality monthly. She reports migraines occur 8-12 times a month. Usually not debilitating but she avoids most activities due to vision changes. Headache usually lasts about 2 hours and relieved by rest. Nurtec works well but she can only afford 2 tablets per month. Sample tablets have been provided through the office when available. Ibuprofen 800mg  helps.  01/23/2023 ALL: Ann Conley returns for follow up for migraines. She continues Scientist, research (life sciences). She reports doing well, overall. She has noticed an increase in episodes of ice pick pain lasting 2-3 seconds, usually in the evenings around dinner. She also feels that migraine aura of sparling lights may last longer over the past couple of months. She contributes this to more stress. Her husband fell about a months or so ago ahd had to go to inpatient rehab. He had a difficult time with side effects from opioids and she reports her sleep patterns were disrupted. She reports BP has been slightly higher than normal. Nurtec works very well. She may take 1-2 doses per month. She had a hard time affording copay with Emgality at the end of last year.   02/21/2022 ALL: Ann Conley returns to discuss some concerns she has about her migraines. She continues to feel migraines are much better managed on Emgality. She continues Emgality on the 28th of every month. She reports auras return about a week before next dose is due. Nurtec works  well. She has intermittent numbess. Sometimes numbness goes away spontaneously. Nurtec abort aura symptoms. She has some ice pick headaches lasting 1-2 seconds in the evenings. Symptoms resolve spontaneously.   She continues to work closely with her PCP for BP management. She has recently restarted amlodipine. Could not tolerate ACE due to cough. She was seen yesterday and BP was much better (130/60, previously 153/75). Today BP is 163/70. She takes amlodipine in the evenings. She reports home readings are 130-135 systolic.    01/12/2022 ALL:  Ann Conley is a 89 y.o. female here today for follow up for migraines. She was last seen 07/2021 and advised to continue Nurtec QOD for prevention. She called 07/26/2021 reports cost of Nurtec was unaffordable. We suggested starting Emgality. Sample pens administered 08/02/2021 in the office and she verbalized willingness and comfort with continuing monthly at home. She called 09/01/2021 requesting another sample pen and for our office to help with administration. She came by the office 11/2021 and picked up Nurtec samples instead of Emgality sample. Conversation with RN 12/05/2021 reports she had returned to taking Nurtec QOD. I recommended she use Emgality for prevention and Nurtec for abortive therapy due to cost. Sample pen injected in the office 12/06/2021. She was given sample pen to administer with directions printed on packaging. She called reporting intractable migraine 12/26/2021. She reported stopping amlodipine/benazepril due to cough and interrupted sleep. She had taken tramadol and ibuprofen but not used Nurtec. She reported being unaware she could use Nurtec with other  medications. She was seen by PCP 12/28/2021 and BP med changed to amlodipine 10mg  daily.   Today, she reports she has been better since starting the Emgality. She came into the office to receive her Emgality injection 12/06/21 and was given the pen for her injection that was due on 01/03/22.  She will be due for her next injection on 01/31/22. Her insurance authorized the [employer] in 09/23, but she states she never received a call. I encouraged the patient to reach out to the pharmacy to obtain this medication.   She has lost about 20 pounds in preparation for knee surgery. Labs followed with PCP. LDL 104. On asa 81mg . Patient has been hesitant to start statin. She reports taking amlodipine as prescribed.   07/14/21 ALL:  Ann Conley returns for follow up for worsening migraines. She was using Nurtec with some success but has not been as effective, recently. She has started taking Nurtec every other day for prevention and felt it was working fairly well until she had a migraine with aura on 8/31. She reports migraine would "come and go" for about 3 days. She did not increase Nurtec but continued every other day dosing. She also stopped propranolol and valsartan. She is uncertain of when she topped these but thinks it was about 3-4 weeks ago. She was seen by PCP 6/23 and valsartan was listed as an active medication. She reports that leg swelling and muscle aches have been better since stopping these medications. She has been watching her diet and walking more and has lost 16 pounds.    03/07/2021 ALL: She returns for follow up for complicated migraines. MRI/MRA unremarkable. Nurtec has been helpful for abortive therapy. BP has been much better with Dr Beverely Low. Ann Conley states it has been elevated some at home. She has had 2-3 headaches over the past month but reports they were not bad and aborted with Nurtec. She has not picked up prescription from pharmacy.    She does report having a fall last week. She was walking through her doorway and her foot got caught on the entryway. She hit her head up on the wall in front of her. No other injuries. She reports that her forehead has been a little sore but she is doing well, otherwise. No changes in vision. No confusion. No worsening headaches.     01/27/2021 ALL:  She returns today with concerns of left arm numbness and left visual disturbance that started 1 day ago. While playing cards, last night. She felt that her left arm was going to sleep. Numbness seemed to wax and wane and would radiate to the left jaw. She describes a kaleidoscope disturbance of the left eye last night around the same time. Visual disturbance lasted about 10 minutes. Numbness of arm is still present, today. It does not seem to make a difference of what position arm is in. Numbness seems to wax and wane. She is constantly sensitive to light. She wears sun glasses all the time, even at home. She denies head pain. She has taken Excedrin that has not helped.   She denies chest pain, trouble breathing, palpitations, sweating, abdominal or back pain, fever, chills, body aches.    She has been working closely with PCP for uncontrolled HTN. Started on valsartan last week. She continues propranolol LA 60mg  daily (for migraines) and amlodipine 10mg  daily. She is unsure if it has made much difference. BP this morning was 200/86. Her head was not hurting when she took her blood pressure.  Usual home readings have been around 160/80's.   Triptans have been avoided d/t history of suspected TIA in 2019 with sudden onset of left arm weakness and facial numbness. Symptoms resolved in about an hour. Imaging unremarkable. She also has history of complicated migraine and migraine with aura. She is usually able to abort headache with Excedrin or ibuprofen. She is concerned that arm numbness has lingered for this long as it usually resolves within 12 hours.      12/16/2020 ALL:  She returns for follow up for complicated migraines. At last visit, we encouraged her to increase propranolol. She reports taking 20mg  BID. She does feel that migraines are not as painful. She continues to have a visual aura with some sort of headache 1-3 times a month. PCP gave her 800mg  ibuprofen that seems to help.  She reports taking 1-2 doses of ibuprofen.    She continues close follow up with PCP for blood pressure management on amlodipine 10mg  daily. She does check on occasion at home but does not remember what readings are. She reports that readings are usually "out of the ballpark". When I ask if today's reading is high for her she reports that it has been this high at home.      09/07/2020 ALL:  Ann Conley is a 89 y.o. female here today for follow up for complicated migraines. She has history of partial vision loss with left arm numbness associated with headache. She was last seen 07/14/2020 by Dr Marjory Lies and advised to increase propranolol to 20mg  BID. She has not increased dose. She has had two migraines since being seen. She was evaluated by the ER on 10/6. CT brain normal. She reports symptoms resolved spontaneously without abortive therapy. She usually takes Excedrin but states that she rarely needs to take it. She reports BP is "always high". Reports it was 144/63 at home yesterday.    REVIEW OF SYSTEMS: Out of a complete 14 system review of symptoms, the patient complains only of the following symptoms, migraines, knee pain and all other reviewed systems are negative.   ALLERGIES: Allergies  Allergen Reactions   Codeine Nausea And Vomiting     HOME MEDICATIONS: Outpatient Medications Prior to Visit  Medication Sig Dispense Refill   amLODipine (NORVASC) 10 MG tablet Take 1 tablet (10 mg total) by mouth daily. 90 tablet 3   Ascorbic Acid (VITAMIN C PO) Take by mouth.     aspirin 81 MG tablet Take 81 mg by mouth every evening.      Galcanezumab-gnlm (EMGALITY) 120 MG/ML SOAJ Inject 120 mg into the skin every 30 (thirty) days. 3 mL 3   ibuprofen (ADVIL) 800 MG tablet Take 1 tablet (800 mg total) by mouth every 8 (eight) hours as needed for headache. 30 tablet 0   Magnesium 250 MG TABS Take 250 mg by mouth as needed.     multivitamin-lutein (OCUVITE-LUTEIN) CAPS capsule Take 1 capsule  by mouth daily.     omeprazole (PRILOSEC) 20 MG capsule Take 1 capsule (20 mg total) by mouth daily. 90 capsule 1   Rimegepant Sulfate (NURTEC) 75 MG TBDP Take 1 tablet (75 mg total) by mouth daily as needed at start of migraine (no more than 1 tablet in 24 hours or 8 per month). 8 tablet 11   traMADol (ULTRAM) 50 MG tablet Take 1 tablet (50 mg total) by mouth every 6 (six) hours as needed. 30 tablet 0   vitamin B-12 (CYANOCOBALAMIN) 100 MCG tablet Take 100  mcg by mouth daily.     Rimegepant Sulfate (NURTEC) 75 MG TBDP Take 1 tablet (75 mg total) by mouth daily as needed at start of migraine (no more than 1 tablet in 24 hours or 8 per month). 4 tablet 0   No facility-administered medications prior to visit.     PAST MEDICAL HISTORY: Past Medical History:  Diagnosis Date   Anemia    Blood transfusion complicating pregnancy 1961   GERD (gastroesophageal reflux disease)    History of chicken pox    Hypertension    Migraine    occular   TIA (transient ischemic attack)      PAST SURGICAL HISTORY: Past Surgical History:  Procedure Laterality Date   BREAST BIOPSY     CHOLECYSTECTOMY     PARTIAL HYSTERECTOMY       FAMILY HISTORY: Family History  Problem Relation Age of Onset   Breast cancer Mother    Heart disease Father    Heart disease Sister        pacemaker   Diabetes Sister      SOCIAL HISTORY: Social History   Socioeconomic History   Marital status: Married    Spouse name: Not on file   Number of children: 5   Years of education: Not on file   Highest education level: Bachelor's degree (e.g., BA, AB, BS)  Occupational History   Not on file  Tobacco Use   Smoking status: Never   Smokeless tobacco: Never  Vaping Use   Vaping status: Never Used  Substance and Sexual Activity   Alcohol use: Yes    Comment: rare   Drug use: No   Sexual activity: Not on file  Other Topics Concern   Not on file  Social History Narrative   Lives with husband.  5 children.   Education BA.   Right handed   Caffeine use: 1 cup coffee every morning   Social Determinants of Health   Financial Resource Strain: Low Risk  (10/05/2022)   Overall Financial Resource Strain (CARDIA)    Difficulty of Paying Living Expenses: Not hard at all  Food Insecurity: No Food Insecurity (10/05/2022)   Hunger Vital Sign    Worried About Running Out of Food in the Last Year: Never true    Ran Out of Food in the Last Year: Never true  Transportation Needs: No Transportation Needs (10/05/2022)   PRAPARE - Administrator, Civil Service (Medical): No    Lack of Transportation (Non-Medical): No  Physical Activity: Inactive (10/05/2022)   Exercise Vital Sign    Days of Exercise per Week: 0 days    Minutes of Exercise per Session: 0 min  Stress: No Stress Concern Present (10/05/2022)   [employer] of Occupational Health - Occupational Stress Questionnaire    Feeling of Stress : Not at all  Social Connections: Socially Integrated (10/05/2022)   Social Connection and Isolation Panel [NHANES]    Frequency of Communication with Friends and Family: More than three times a week    Frequency of Social Gatherings with Friends and Family: Three times a week    Attends Religious Services: More than 4 times per year    Active Member of Clubs or Organizations: Yes    Attends Banker Meetings: More than 4 times per year    Marital Status: Married  Catering manager Violence: Not At Risk (10/05/2022)   Humiliation, Afraid, Rape, and Kick questionnaire    Fear of Current or Ex-Partner: No  Emotionally Abused: No    Physically Abused: No    Sexually Abused: No     PHYSICAL EXAM  There were no vitals filed for this visit.    There is no height or weight on file to calculate BMI.  Generalized: Well developed, in no acute distress  Cardiology: normal rate and rhythm, no murmur auscultated  Respiratory: clear to auscultation bilaterally    Neurological  examination  Mentation: Alert oriented to time, place, history taking. Follows all commands speech and language fluent Cranial nerve II-XII: Pupils were equal round reactive to light. Right iris with darker coloring at 6 o'clock (baseline). Extraocular movements were full, visual field were full on confrontational test. Facial sensation and strength were normal. Head turning and shoulder shrug  were normal and symmetric. Motor: The motor testing reveals 5 over 5 strength of all 4 extremities. Good symmetric motor tone is noted throughout.  Gait and station: Gait is arthritic, stable with cane     DIAGNOSTIC DATA (LABS, IMAGING, TESTING) - I reviewed patient records, labs, notes, testing and imaging myself where available.  Lab Results  Component Value Date   WBC 7.1 08/17/2023   HGB 12.9 08/17/2023   HCT 40.3 08/17/2023   MCV 87.0 08/17/2023   PLT 265.0 08/17/2023      Component Value Date/Time   NA 140 08/17/2023 1318   K 4.1 08/17/2023 1318   CL 100 08/17/2023 1318   CO2 30 08/17/2023 1318   GLUCOSE 112 (H) 08/17/2023 1318   BUN 16 08/17/2023 1318   CREATININE 0.76 08/17/2023 1318   CREATININE 0.56 (L) 02/09/2023 1504   CALCIUM 9.6 08/17/2023 1318   PROT 6.4 08/17/2023 1318   ALBUMIN 4.3 08/17/2023 1318   AST 13 08/17/2023 1318   ALT 11 08/17/2023 1318   ALKPHOS 102 08/17/2023 1318   BILITOT 0.4 08/17/2023 1318   GFRNONAA >60 01/27/2021 1757   GFRAA >60 08/10/2018 1330   Lab Results  Component Value Date   CHOL 201 (H) 08/17/2023   HDL 75.60 08/17/2023   LDLCALC 94 08/17/2023   LDLDIRECT 125.0 09/09/2012   TRIG 158.0 (H) 08/17/2023   CHOLHDL 3 08/17/2023   Lab Results  Component Value Date   HGBA1C 5.7 09/12/2019   No results found for: "VITAMINB12" Lab Results  Component Value Date   TSH 3.57 08/17/2023       09/20/2017   11:05 AM  MMSE - Mini Mental State Exam  Orientation to time 5  Orientation to Place 5  Registration 3  Attention/ Calculation 5   Recall 2  Language- name 2 objects 2  Language- repeat 1  Language- follow 3 step command 3  Language- read & follow direction 1  Write a sentence 1  Copy design 1  Total score 29         No data to display           ASSESSMENT AND PLAN  89 y.o. year old female  has a past medical history of Anemia, Blood transfusion complicating pregnancy (1961), GERD (gastroesophageal reflux disease), History of chicken pox, Hypertension, Migraine, and TIA (transient ischemic attack). here with    No diagnosis found.  Pinkie reports migraines are well managed on Emgality. We have discussed more frequent ice pick pain and longer aura but she feels this is probably related to stress and change in sleeping pattern and does not wish to change medication regimen. She will continue 1 injection on the 28th of every month. She may  continue Nurtec as needed. She will monitor closely and let me know if symptoms worsen. E can consider Bennie Pierini if she wishes or has trouble with cost of Emgality. She was encouraged to continue amlodipine for BP management. She will continue close follow up with PCP. She may return in 1 year, sooner if needed.    No orders of the defined types were placed in this encounter.    No orders of the defined types were placed in this encounter.     Shawnie Dapper, MSN, FNP-C 09/18/2023, 9:34 AM  [facility] [address] [place], [place] [address] [phone]

## 2023-09-20 ENCOUNTER — Ambulatory Visit: Payer: Medicare Other | Admitting: Family Medicine

## 2023-09-20 ENCOUNTER — Encounter: Payer: Self-pay | Admitting: Family Medicine

## 2023-09-20 VITALS — BP 152/76 | HR 81 | Ht 59.0 in | Wt 223.5 lb

## 2023-09-20 DIAGNOSIS — G43109 Migraine with aura, not intractable, without status migrainosus: Secondary | ICD-10-CM

## 2023-09-20 DIAGNOSIS — R441 Visual hallucinations: Secondary | ICD-10-CM

## 2023-09-20 DIAGNOSIS — I10 Essential (primary) hypertension: Secondary | ICD-10-CM | POA: Diagnosis not present

## 2023-09-20 DIAGNOSIS — Z8673 Personal history of transient ischemic attack (TIA), and cerebral infarction without residual deficits: Secondary | ICD-10-CM

## 2023-09-21 ENCOUNTER — Other Ambulatory Visit (HOSPITAL_COMMUNITY): Payer: Self-pay

## 2023-09-25 ENCOUNTER — Other Ambulatory Visit (HOSPITAL_COMMUNITY): Payer: Self-pay

## 2023-10-24 DIAGNOSIS — H35363 Drusen (degenerative) of macula, bilateral: Secondary | ICD-10-CM | POA: Diagnosis not present

## 2023-11-02 NOTE — Telephone Encounter (Signed)
error 

## 2023-11-05 ENCOUNTER — Encounter: Payer: Self-pay | Admitting: Family Medicine

## 2023-11-08 ENCOUNTER — Other Ambulatory Visit: Payer: Self-pay | Admitting: Family Medicine

## 2023-11-08 ENCOUNTER — Other Ambulatory Visit (HOSPITAL_COMMUNITY): Payer: Self-pay

## 2023-11-08 ENCOUNTER — Ambulatory Visit: Payer: Self-pay | Admitting: Family Medicine

## 2023-11-08 ENCOUNTER — Other Ambulatory Visit: Payer: Self-pay

## 2023-11-08 DIAGNOSIS — G43109 Migraine with aura, not intractable, without status migrainosus: Secondary | ICD-10-CM

## 2023-11-08 MED ORDER — TRAMADOL HCL 50 MG PO TABS
50.0000 mg | ORAL_TABLET | Freq: Four times a day (QID) | ORAL | 0 refills | Status: DC | PRN
  Filled 2023-11-08: qty 30, 8d supply, fill #0

## 2023-11-08 MED ORDER — IBUPROFEN 800 MG PO TABS
800.0000 mg | ORAL_TABLET | Freq: Three times a day (TID) | ORAL | 0 refills | Status: DC | PRN
  Filled 2023-11-08: qty 30, 10d supply, fill #0

## 2023-11-08 NOTE — Telephone Encounter (Signed)
 Requested Prescriptions   Pending Prescriptions Disp Refills   traMADol  (ULTRAM ) 50 MG tablet 30 tablet 0    Sig: Take 1 tablet (50 mg total) by mouth every 6 (six) hours as needed.     Date of patient request: 11/08/2023 Last office visit: 08/13/2023 Upcoming visit: 11/08/2023 Date of last refill: 08/13/2023 Last refill amount: 30

## 2023-11-08 NOTE — Telephone Encounter (Signed)
  Chief Complaint: Refill Request  Additional Notes: Patient requesting refill on Tramadol  and Ibuprofen       Reason for Disposition  Caller requesting a CONTROLLED substance prescription refill (e.g., narcotics, ADHD medicines)  Answer Assessment - Initial Assessment Questions 1. DRUG NAME: What medicine do you need to have refilled?     Tramadol  and Ibuprofen   2. REFILLS REMAINING: How many refills are remaining? (Note: The label on the medicine or pill bottle will show how many refills are remaining. If there are no refills remaining, then a renewal may be needed.)     No refills remain  3. EXPIRATION DATE: What is the expiration date? (Note: The label states when the prescription will expire, and thus can no longer be refilled.)     Tramadol  exp. 4/5/25p; Ibuprofen  08/30/24  4. PRESCRIBING HCP: Who prescribed it? Reason: If prescribed by specialist, call should be referred to that group.     Dr. Mahlon  5. SYMPTOMS: Do you have any symptoms?     No symptoms at this time  6. PREGNANCY: Is there any chance that you are pregnant? When was your last menstrual period?     N/A  Protocols used: Medication Refill and Renewal Call-A-AH

## 2023-11-08 NOTE — Telephone Encounter (Signed)
 Will respond via refill request

## 2023-11-08 NOTE — Telephone Encounter (Signed)
 Requested Prescriptions   Pending Prescriptions Disp Refills   ibuprofen  (ADVIL ) 800 MG tablet 30 tablet 0    Sig: Take 1 tablet (800 mg total) by mouth every 8 (eight) hours as needed for headache.     Date of patient request: 11/08/2023 Last office visit: 08/13/2023 Upcoming visit: Visit date not found Date of last refill: 08/29/2023 Last refill amount: 30

## 2023-11-09 ENCOUNTER — Other Ambulatory Visit (HOSPITAL_COMMUNITY): Payer: Self-pay

## 2023-11-09 ENCOUNTER — Other Ambulatory Visit: Payer: Self-pay

## 2023-11-13 ENCOUNTER — Encounter: Payer: Self-pay | Admitting: Family

## 2023-11-13 ENCOUNTER — Ambulatory Visit: Payer: Self-pay | Admitting: Family Medicine

## 2023-11-13 ENCOUNTER — Ambulatory Visit: Payer: Medicare Other | Admitting: Family

## 2023-11-13 VITALS — BP 146/66 | HR 78 | Temp 97.9°F | Ht 59.0 in | Wt 223.0 lb

## 2023-11-13 DIAGNOSIS — R200 Anesthesia of skin: Secondary | ICD-10-CM | POA: Diagnosis not present

## 2023-11-13 NOTE — Progress Notes (Signed)
 Acute Office Visit  Subjective:     Patient ID: Ann Conley, female    DOB: July 02, 1934, 89 y.o.   MRN: [id number]  Chief Complaint  Patient presents with  . Flank Pain    Lt side intermittent over the last few months, notes has had to pull on her husband after his fall. Last night triage took note she had arm numbness and tingling in the face none of these sxs present today     HPI Patient is in today with multiple concerns. She reports left flank pain that she attributes to helping her husband to stand over the last few days. She feels better today and has no pain.   Has concerns of bilateral hand and arm numbness that comes and goes. Symptoms typically last a few seconds then resolves. She also reports a clicking noise when she turns her neck. The symptoms appear to be worse at night. Denies pain.  Patient also complains of numbness around her mouth and twisting of her face that occurred last night only last a few seconds then resolving. She has never had this issue before. No residual symptoms. Sees a neurologist for complicated migraines.   Review of Systems  Eyes: Negative.  Negative for blurred vision, double vision and photophobia.  Respiratory: Negative.    Cardiovascular: Negative.   Musculoskeletal:        Left lower back pain-resolved  Neurological:  Positive for headaches. Negative for speech change and weakness.       Numbness in arms and hands that comes and goes.  Mouth twisted-resolved, numbness  Psychiatric/Behavioral: Negative.    All other systems reviewed and are negative. Past Medical History:  Diagnosis Date  . Anemia   . Blood transfusion complicating pregnancy 1961  . GERD (gastroesophageal reflux disease)   . History of chicken pox   . Hypertension   . Migraine    occular  . TIA (transient ischemic attack)     Social History   Socioeconomic History  . Marital status: Married    Spouse name: Not on file  . Number of children: 5  .  Years of education: Not on file  . Highest education level: Bachelor's degree (e.g., BA, AB, BS)  Occupational History  . Not on file  Tobacco Use  . Smoking status: Never  . Smokeless tobacco: Never  Vaping Use  . Vaping status: Never Used  Substance and Sexual Activity  . Alcohol use: Yes    Comment: rare  . Drug use: No  . Sexual activity: Not on file  Other Topics Concern  . Not on file  Social History Narrative   Lives with husband.  5 children.  Education BA.   Right handed   Caffeine use: 1 cup coffee every morning   Social Drivers of Health   Financial Resource Strain: Low Risk  (10/05/2022)   Overall Financial Resource Strain (CARDIA)   . Difficulty of Paying Living Expenses: Not hard at all  Food Insecurity: No Food Insecurity (10/05/2022)   Hunger Vital Sign   . Worried About Programme Researcher, Broadcasting/film/video in the Last Year: Never true   . Ran Out of Food in the Last Year: Never true  Transportation Needs: No Transportation Needs (10/05/2022)   PRAPARE - Transportation   . Lack of Transportation (Medical): No   . Lack of Transportation (Non-Medical): No  Physical Activity: Inactive (10/05/2022)   Exercise Vital Sign   . Days of Exercise per Week: 0 days   .  Minutes of Exercise per Session: 0 min  Stress: No Stress Concern Present (10/05/2022)   [employer] of Occupational Health - Occupational Stress Questionnaire   . Feeling of Stress : Not at all  Social Connections: Socially Integrated (10/05/2022)   Social Connection and Isolation Panel [NHANES]   . Frequency of Communication with Friends and Family: More than three times a week   . Frequency of Social Gatherings with Friends and Family: Three times a week   . Attends Religious Services: More than 4 times per year   . Active Member of Clubs or Organizations: Yes   . Attends Banker Meetings: More than 4 times per year   . Marital Status: Married  Catering Manager Violence: Not At Risk  (10/05/2022)   Humiliation, Afraid, Rape, and Kick questionnaire   . Fear of Current or Ex-Partner: No   . Emotionally Abused: No   . Physically Abused: No   . Sexually Abused: No    Past Surgical History:  Procedure Laterality Date  . BREAST BIOPSY    . CHOLECYSTECTOMY    . PARTIAL HYSTERECTOMY      Family History  Problem Relation Age of Onset  . Breast cancer Mother   . Heart disease Father   . Heart disease Sister        pacemaker  . Diabetes Sister     Allergies  Allergen Reactions  . Codeine Nausea And Vomiting    Current Outpatient Medications on File Prior to Visit  Medication Sig Dispense Refill  . amLODipine  (NORVASC ) 10 MG tablet Take 1 tablet (10 mg total) by mouth daily. 90 tablet 3  . Ascorbic Acid (VITAMIN C PO) Take by mouth.    . aspirin  81 MG tablet Take 81 mg by mouth every evening.     . Galcanezumab -gnlm (EMGALITY ) 120 MG/ML SOAJ Inject 120 mg into the skin every 30 (thirty) days. 3 mL 3  . ibuprofen  (ADVIL ) 800 MG tablet Take 1 tablet (800 mg total) by mouth every 8 (eight) hours as needed for headache. 30 tablet 0  . Magnesium 250 MG TABS Take 250 mg by mouth as needed.    . omeprazole  (PRILOSEC) 20 MG capsule Take 1 capsule (20 mg total) by mouth daily. 90 capsule 1  . Rimegepant Sulfate  (NURTEC) 75 MG TBDP Take 1 tablet (75 mg total) by mouth daily as needed at start of migraine (no more than 1 tablet in 24 hours or 8 per month). 8 tablet 11  . multivitamin-lutein (OCUVITE-LUTEIN) CAPS capsule Take 1 capsule by mouth daily. (Patient not taking: Reported on 11/13/2023)    . traMADol  (ULTRAM ) 50 MG tablet Take 1 tablet (50 mg total) by mouth every 6 (six) hours as needed. (Patient not taking: Reported on 11/13/2023) 30 tablet 0  . vitamin B-12 (CYANOCOBALAMIN ) 100 MCG tablet Take 100 mcg by mouth daily. (Patient not taking: Reported on 11/13/2023)     No current facility-administered medications on file prior to visit.    BP (!) 146/66   Pulse 78    Temp 97.9 F (36.6 C) (Temporal)   Ht 4' 11 (1.499 m)   Wt 223 lb (101.2 kg)   SpO2 97%   BMI 45.04 kg/m chart      Objective:    BP (!) 146/66   Pulse 78   Temp 97.9 F (36.6 C) (Temporal)   Ht 4' 11 (1.499 m)   Wt 223 lb (101.2 kg)   SpO2 97%   BMI 45.04  kg/m    Physical Exam Vitals and nursing note reviewed.  Constitutional:      Appearance: Normal appearance. She is obese.  HENT:     Head: Normocephalic.  Eyes:     Pupils: Pupils are equal, round, and reactive to light.  Neck:     Vascular: No carotid bruit.  Cardiovascular:     Rate and Rhythm: Normal rate and regular rhythm.     Pulses: Normal pulses.     Heart sounds: Normal heart sounds.  Pulmonary:     Effort: Pulmonary effort is normal.     Breath sounds: Normal breath sounds.  Abdominal:     General: Abdomen is flat. Bowel sounds are normal.     Palpations: Abdomen is soft.  Musculoskeletal:        General: Normal range of motion.     Cervical back: Normal range of motion and neck supple.  Skin:    General: Skin is warm and dry.  Neurological:     Mental Status: She is alert and oriented to person, place, and time.     Cranial Nerves: Cranial nerves 2-12 are intact. No cranial nerve deficit or facial asymmetry.     Sensory: Sensation is intact.     Motor: No weakness.     Deep Tendon Reflexes: Reflexes normal.  Psychiatric:        Mood and Affect: Mood normal.        Behavior: Behavior normal.   No results found for any visits on 11/13/23.      Assessment & Plan:   Problem List Items Addressed This Visit   None Visit Diagnoses       Left arm numbness    -  Primary   Relevant Orders   US  Carotid Duplex Bilateral   DG Cervical Spine Complete     Right arm numbness       Relevant Orders   US  Carotid Duplex Bilateral   DG Cervical Spine Complete     Numbness around mouth       Relevant Orders   US  Carotid Duplex Bilateral       No orders of the defined types were placed in  this encounter.  Order placed for carotid doppler to assess for TIAs given her symptoms. Order was also placed for c spine xray to assess for cervical radiculopathy.   No follow-ups on file.  Oneka Parada B Sreeja Spies, FNP

## 2023-11-13 NOTE — Telephone Encounter (Signed)
 Pt was seen in office 11/12/2025 by Cyndee Brightly

## 2023-11-13 NOTE — Telephone Encounter (Signed)
 Copied from CRM [id number]. Topic: Clinical - Red Word Triage >> Nov 13, 2023  8:11 AM Robinson H wrote: [facility] that prompted transfer to Nurse Triage: Patient states she's having sharp pains in left side, last night right hand went numb and went away, comes and goes. Had migraine yesterday, gone right now  Chief Complaint: pain left side 06/10 - none present now Symptoms: pain left side that comes and goes, history of migraine that comes with s/s of stroke like hand & mouth numbness, mouth twisting - pt currently takes medication to help with migraines and these stroke like s/s Frequency: left side pain started yesterday Pertinent Negatives: Patient denies pain, numbness, twisted mouth at present time Disposition: [] ED /[] Urgent Care (no appt availability in office) / [x] Appointment(In office/virtual)/ []  [place] [facility]/ [] Home Care/ [] Refused Recommended Disposition /[] [place] [facility]/ []  Follow-up with PCP Additional Notes: scheduled in office appt for today with provided w/n PCP office to evaluate left side pain and patient would like to address concerns with numbness of mouth and how to care for this when it does happen again.  Reason for Disposition  [1] MODERATE pain (e.g., interferes with normal activities) AND [2] pain comes and goes (cramps) AND [3] present > 24 hours  (Exception: Pain with Vomiting or Diarrhea - see that Guideline.)  Answer Assessment - Initial Assessment Questions 1. LOCATION: Where does it hurt?      Left side 2. RADIATION: Does the pain shoot anywhere else? (e.g., chest, back)     Non-radiating 3. ONSET: When did the pain begin? (e.g., minutes, hours or days ago)      Yesterday pain started 4. SUDDEN: Gradual or sudden onset?     Shooting pain that comes and pain 6/10 5. PATTERN Does the pain come and go, or is it constant?    - If it comes and goes: How long does it last? Do you have pain now?     (Note: Comes and goes means the  pain is intermittent. It goes away completely between bouts.)    - If constant: Is it getting better, staying the same, or getting worse?      (Note: Constant means the pain never goes away completely; most serious pain is constant and gets worse.)      Comes and goes 6. SEVERITY: How bad is the pain?  (e.g., Scale 1-10; mild, moderate, or severe)    - MILD (1-3): Doesn't interfere with normal activities, abdomen soft and not tender to touch.     - MODERATE (4-7): Interferes with normal activities or awakens from sleep, abdomen tender to touch.     - SEVERE (8-10): Excruciating pain, doubled over, unable to do any normal activities.       06/10 when present but none today 7. RECURRENT SYMPTOM: Have you ever had this type of stomach pain before? If Yes, ask: When was the last time? and What happened that time?      Yes because it comes and goes 8. CAUSE: What do you think is causing the stomach pain?     Unsure of cause: may have - may come from helping husband since he fell 9. RELIEVING/AGGRAVATING FACTORS: What makes it better or worse? (e.g., antacids, bending or twisting motion, bowel movement)     N/a 10. OTHER SYMPTOMS: Do you have any other symptoms? (e.g., back pain, diarrhea, fever, urination pain, vomiting)       Has history of complicated migraines w/right hand numbness comes  and goes and mouth twisted and has numbness and now has gone away 11. PREGNANCY: Is there any chance you are pregnant? When was your last menstrual period?       N/a  Protocols used: Abdominal Pain - Female-A-AH

## 2023-11-19 ENCOUNTER — Other Ambulatory Visit (HOSPITAL_COMMUNITY): Payer: Self-pay

## 2023-11-19 ENCOUNTER — Other Ambulatory Visit: Payer: Self-pay

## 2023-11-19 ENCOUNTER — Ambulatory Visit (INDEPENDENT_AMBULATORY_CARE_PROVIDER_SITE_OTHER)
Admission: RE | Admit: 2023-11-19 | Discharge: 2023-11-19 | Disposition: A | Discharge status: Home / Self Care | Payer: Medicare Other | Source: Ambulatory Visit | Attending: Family | Admitting: Family

## 2023-11-19 DIAGNOSIS — R200 Anesthesia of skin: Secondary | ICD-10-CM

## 2023-11-19 DIAGNOSIS — M47812 Spondylosis without myelopathy or radiculopathy, cervical region: Secondary | ICD-10-CM | POA: Diagnosis not present

## 2023-11-19 DIAGNOSIS — M50222 Other cervical disc displacement at C5-C6 level: Secondary | ICD-10-CM | POA: Diagnosis not present

## 2023-11-19 DIAGNOSIS — M40202 Unspecified kyphosis, cervical region: Secondary | ICD-10-CM | POA: Diagnosis not present

## 2023-11-19 DIAGNOSIS — M4802 Spinal stenosis, cervical region: Secondary | ICD-10-CM | POA: Diagnosis not present

## 2023-11-28 ENCOUNTER — Telehealth: Payer: Self-pay | Admitting: Family Medicine

## 2023-11-28 NOTE — Telephone Encounter (Signed)
Called pt back and she stated that her auras with her migraines has gotten worse as well as her visual Hallucinations. Pt states that her arms from her shoulder down to her hands have been numb due to her migraine. Pt states that she has not taking her nurtec and is out of her emgality. Pt states that her last migraine her face felt numb as well as her mouth slouching. Told pt that Amy will be notified about what I going on with her, also instructed her if her symptoms get worse to go to the ER to checked out.

## 2023-11-28 NOTE — Telephone Encounter (Signed)
Pt returning call. Transferred to POD 1

## 2023-11-28 NOTE — Telephone Encounter (Signed)
Pt said everyday  having aura, having arms and hands numbness relating to  the migraines. I  usually take some Asprin. Advised patient if you having stroke like symptoms you need to go to the ER. Patient said was told if numbness in hands and arms disappears it is not a stroke. Would like a call from the nurse. Have not been taking the Nurtec.

## 2023-11-28 NOTE — Telephone Encounter (Signed)
LVM for pt to call back.

## 2023-11-29 NOTE — Telephone Encounter (Signed)
Called pt back. She has not taken Nurtec in last 2-3 weeks. I encouraged her to take this today to see if this helps her sx. She is due for next Emgality injection 12/04/23 and will take when due. She has this on hand.   If sx worsen or she develops new sx, she will do mychart VV urgent care visit or go to walk in clinic for eval/treatment. She will also then call our office to get scheduled for appt after.

## 2023-12-03 ENCOUNTER — Telehealth: Payer: Self-pay | Admitting: Family Medicine

## 2023-12-03 NOTE — Telephone Encounter (Signed)
-----   Message from [facility] sent at 12/03/2023  1:58 PM EST ----- Regarding: AWV Patient's last AWV was 2023. Please call her to see if we can get her scheduled for her AWV-sequential with Raynelle Fanning.

## 2023-12-03 NOTE — Telephone Encounter (Signed)
Pt has AW appt 01/08/2024

## 2023-12-04 ENCOUNTER — Telehealth: Payer: Self-pay | Admitting: Family Medicine

## 2023-12-04 ENCOUNTER — Other Ambulatory Visit: Payer: Self-pay | Admitting: Family

## 2023-12-04 DIAGNOSIS — R200 Anesthesia of skin: Secondary | ICD-10-CM

## 2023-12-04 NOTE — Telephone Encounter (Signed)
Dorothy from Radiology at [facility] called to get clarification on orders from NP Worthy Rancher regarding an US Carotid Duplex Bilateral. She was asking if this was suppose to be performed through radiology as she wanted to make sure patient was scheduled for the correct imaging.

## 2023-12-04 NOTE — Telephone Encounter (Signed)
Spoke to patient about scheduling US CAROTID BILATERAL Provided scheduling ph# [phone] to schedule at [facility].

## 2023-12-04 NOTE — Telephone Encounter (Signed)
Called Ann Conley back and discussed, notes she does VS carotid duplex scans but not US carotid scans please confirm whether you want scan done with vascular (VS) or with Radiology ([place])

## 2023-12-05 NOTE — Telephone Encounter (Signed)
Thea Silversmith, You are correct, the order is correctly placed as "[place] not VAS". [facility] does have an ultrasound department, which is where the provider selected as the preferred imaging location. Not sure why [facility] assigned as VAS when it clearly says [place]. I will call them as well. Thanks, Albin Felling  (Response to message from [employer], CMA, 2 hours ago (12:05 PM) Called [facility] the do not do Vascular US this order needs to be sent elsewhere per [facility] note on 12/04/2023 phone call it is to be [place] not VS  Please assist with this order)

## 2023-12-05 NOTE — Telephone Encounter (Signed)
Called [facility] the do not do Vascular US this order needs to be sent elsewhere per [facility] note on 12/04/2023 phone call it is to be [place] not VS   Please assist with this order

## 2023-12-05 NOTE — Telephone Encounter (Signed)
Called back, they stated they cannot do this at their location would have to be VS sent a message to Tillman Sers asking her to look into this so we can get the US done elsewhere

## 2023-12-06 ENCOUNTER — Other Ambulatory Visit (HOSPITAL_COMMUNITY): Payer: Self-pay

## 2023-12-06 ENCOUNTER — Other Ambulatory Visit (HOSPITAL_BASED_OUTPATIENT_CLINIC_OR_DEPARTMENT_OTHER): Payer: Self-pay

## 2023-12-13 ENCOUNTER — Other Ambulatory Visit: Payer: Self-pay

## 2023-12-14 ENCOUNTER — Ambulatory Visit (HOSPITAL_COMMUNITY)
Admission: RE | Admit: 2023-12-14 | Discharge: 2023-12-14 | Disposition: A | Discharge status: Home / Self Care | Payer: Medicare Other | Source: Ambulatory Visit | Attending: Family | Admitting: Family

## 2023-12-14 ENCOUNTER — Other Ambulatory Visit: Payer: Self-pay

## 2023-12-14 DIAGNOSIS — R200 Anesthesia of skin: Secondary | ICD-10-CM | POA: Insufficient documentation

## 2023-12-14 NOTE — Progress Notes (Signed)
 Carotid arterial duplex completed. Please see CV Procedures for preliminary results.  Estanislao Heimlich, RVT 12/14/23 3:06 PM

## 2023-12-16 ENCOUNTER — Encounter: Payer: Self-pay | Admitting: Family Medicine

## 2023-12-17 ENCOUNTER — Telehealth: Payer: Self-pay

## 2023-12-17 NOTE — Telephone Encounter (Signed)
-----   Message from Laymon Priest sent at 12/16/2023 12:57 PM EST ----- Normal carotid study- this is great news!

## 2023-12-17 NOTE — Telephone Encounter (Signed)
 Pt has reviewed via MyChart

## 2023-12-19 ENCOUNTER — Other Ambulatory Visit: Payer: Self-pay

## 2023-12-20 NOTE — Telephone Encounter (Signed)
Pt returned called, she stated she did try to take Nurtec as prescribed. It helped but migraines would come right back. She did take her Emgality injection 1/28. She was concerned that her visual hallucinations are worsening. She describes seeing a shadow in the distance, like a fairy or angel moving around. Sometimes these events occur when she is having a headache and other times it doesn't. She denied any other symptoms or new changes. She did mention this during last OV but it is happening more often to about every to every other night. Pt wanted to schedule a visit with NP. Virtual appt scheduled tomorrow, advised pt to log on about 10-15 mins early to ensure she can get on. She verbally understood and was appreciative.

## 2023-12-20 NOTE — Telephone Encounter (Signed)
Appointment Request From: Drexel Iha   With Provider: [facility]]   Preferred Date Range: Any   Preferred Times: Any Time   Reason for visit: Request an Appointment   Health Maintenance Topic:    Comments: The Aura is daily and the hallucinations are also usually.   I had an ultrasound and nothing showed up.  Isn't there I I something we can do to help me me.   Can I get an appointment?

## 2023-12-20 NOTE — Telephone Encounter (Signed)
Contacted pt regarding her appt request, LVM rq call back.

## 2023-12-21 ENCOUNTER — Encounter: Payer: Self-pay | Admitting: Family Medicine

## 2023-12-21 ENCOUNTER — Telehealth: Payer: Medicare Other | Admitting: Family Medicine

## 2023-12-21 DIAGNOSIS — G43109 Migraine with aura, not intractable, without status migrainosus: Secondary | ICD-10-CM

## 2023-12-21 DIAGNOSIS — R441 Visual hallucinations: Secondary | ICD-10-CM

## 2023-12-21 DIAGNOSIS — Z8673 Personal history of transient ischemic attack (TIA), and cerebral infarction without residual deficits: Secondary | ICD-10-CM | POA: Diagnosis not present

## 2023-12-21 DIAGNOSIS — I10 Essential (primary) hypertension: Secondary | ICD-10-CM

## 2023-12-21 NOTE — Patient Instructions (Signed)
Below is our plan:  We will stop Emgality and start Vypeti infusions every 12 weeks. Continue Nurtec as needed. I have ordered an MRI for evaluation.   Please make sure you are staying well hydrated. I recommend 50-60 ounces daily. Well balanced diet and regular exercise encouraged. Consistent sleep schedule with 6-8 hours recommended.   Please continue follow up with care team as directed.   Follow up with me pending approval of Vyepti   You may receive a survey regarding today's visit. I encourage you to leave honest feed back as I do use this information to improve patient care. Thank you for seeing me today!   GENERAL HEADACHE INFORMATION:   Natural supplements: Magnesium Oxide or Magnesium Glycinate 500 mg at bed (up to 800 mg daily) Coenzyme Q10 300 mg in AM Vitamin B2- 200 mg twice a day   Add 1 supplement at a time since even natural supplements can have undesirable side effects. You can sometimes buy supplements cheaper (especially Coenzyme Q10) at www.WebmailGuide.co.za or at [facility].  Migraine with aura: There is increased risk for stroke in women with migraine with aura and a contraindication for the combined contraceptive pill for use by women who have migraine with aura. The risk for women with migraine without aura is lower. However other risk factors like smoking are far more likely to increase stroke risk than migraine. There is a recommendation for no smoking and for the use of OCPs without estrogen such as progestogen only pills particularly for women with migraine with aura.Marland Kitchen People who have migraine headaches with auras may be 3 times more likely to have a stroke caused by a blood clot, compared to migraine patients who don't see auras. Women who take hormone-replacement therapy may be 30 percent more likely to suffer a clot-based stroke than women not taking medication containing estrogen. Other risk factors like smoking and high blood pressure may be  much more important.     Vitamins and herbs that show potential:   Magnesium: Magnesium (250 mg twice a day or 500 mg at bed) has a relaxant effect on smooth muscles such as blood vessels. Individuals suffering from frequent or daily headache usually have low magnesium levels which can be increase with daily supplementation of 400-750 mg. Three trials found 40-90% average headache reduction  when used as a preventative. Magnesium may help with headaches are aura, the best evidence for magnesium is for migraine with aura is its thought to stop the cortical spreading depression we believe is the pathophysiology of migraine aura.Magnesium also demonstrated the benefit in menstrually related migraine.  Magnesium is part of the messenger system in the serotonin cascade and it is a good muscle relaxant.  It is also useful for constipation which can be a side effect of other medications used to treat migraine. Good sources include nuts, whole grains, and tomatoes. Side Effects: loose stool/diarrhea  Riboflavin (vitamin B 2) 200 mg twice a day. This vitamin assists nerve cells in the production of ATP a principal energy storing molecule.  It is necessary for many chemical reactions in the body.  There have been at least 3 clinical trials of riboflavin using 400 mg per day all of which suggested that migraine frequency can be decreased.  All 3 trials showed significant improvement in over half of migraine sufferers.  The supplement is found in bread, cereal, milk, meat, and poultry.  Most Americans get more riboflavin than the recommended daily allowance, however riboflavin deficiency is not necessary for  the supplements to help prevent headache. Side effects: energizing, green urine   Coenzyme Q10: This is present in almost all cells in the body and is critical component for the conversion of energy.  Recent studies have shown that a nutritional supplement of CoQ10 can reduce the frequency of migraine attacks by improving the energy  production of cells as with riboflavin.  Doses of 150 mg twice a day have been shown to be effective.   Melatonin: Increasing evidence shows correlation between melatonin secretion and headache conditions.  Melatonin supplementation has decreased headache intensity and duration.  It is widely used as a sleep aid.  Sleep is natures way of dealing with migraine.  A dose of 3 mg is recommended to start for headaches including cluster headache. Higher doses up to 15 mg has been reviewed for use in Cluster headache and have been used. The rationale behind using melatonin for cluster is that many theories regarding the cause of Cluster headache center around the disruption of the normal circadian rhythm in the brain.  This helps restore the normal circadian rhythm.   HEADACHE DIET: Foods and beverages which may trigger migraine Note that only 20% of headache patients are food sensitive. You will know if you are food sensitive if you get a headache consistently 20 minutes to 2 hours after eating a certain food. Only cut out a food if it causes headaches, otherwise you might remove foods you enjoy! What matters most for diet is to eat a well balanced healthy diet full of vegetables and low fat protein, and to not miss meals.   Chocolate, other sweets ALL cheeses except cottage and cream cheese Dairy products, yogurt, sour cream, ice cream Liver Meat extracts (Bovril, Marmite, meat tenderizers) Meats or fish which have undergone aging, fermenting, pickling or smoking. These include: Hotdogs,salami,Lox,sausage, mortadellas,smoked salmon, pepperoni, Pickled herring Pods of broad bean (English beans, Chinese pea pods, [place] (fava) beans, lima and navy beans Ripe avocado, ripe banana Yeast extracts or active yeast preparations such as Brewer's or Fleishman's (commercial bakes goods are permitted) Tomato based foods, pizza (lasagna, etc.)   MSG (monosodium glutamate) is disguised as many things; look for  these common aliases: Monopotassium glutamate Autolysed yeast Hydrolysed protein Sodium caseinate "flavorings" "all natural preservatives" Nutrasweet   Avoid all other foods that convincingly provoke headaches.   Resources: The Dizzy [place] Laundry Your Headache Diet, migrainestrong.com  https://zamora-andrews.com/   Caffeine and Migraine For patients that have migraine, caffeine intake more than 3 days per week can lead to dependency and increased migraine frequency. I would recommend cutting back on your caffeine intake as best you can. The recommended amount of caffeine is 200-300 mg daily, although migraine patients may experience dependency at even lower doses. While you may notice an increase in headache temporarily, cutting back will be helpful for headaches in the long run. For more information on caffeine and migraine, visit: https://americanmigrainefoundation.org/resource-library/caffeine-and-migraine/   Headache Prevention Strategies:   1. Maintain a headache diary; learn to identify and avoid triggers.  - This can be a simple note where you log when you had a headache, associated symptoms, and medications used - There are several smartphone apps developed to help track migraines: Migraine Buddy, Migraine Monitor, Curelator N1-Headache App   Common triggers include: Emotional triggers: Emotional/Upset family or friends Emotional/Upset occupation Business reversal/success Anticipation anxiety Crisis-serious Post-crisis periodNew job/position   Physical triggers: Vacation Day Weekend Strenuous Exercise High Altitude Location New Move Menstrual Day Physical Illness Oversleep/Not enough sleep Weather changes  Light: Photophobia or light sesnitivity treatment involves a balance between desensitization and reduction in overly strong input. Use dark polarized glasses outside, but not inside. Avoid bright or fluorescent light, but  do not dim environment to the point that going into a normally lit room hurts. Consider FL-41 tint lenses, which reduce the most irritating wavelengths without blocking too much light.  These can be obtained at axonoptics.com or theraspecs.com Foods: see list above.   2. Limit use of acute treatments (over-the-counter medications, triptans, etc.) to no more than 2 days per week or 10 days per month to prevent medication overuse headache (rebound headache).     3. Follow a regular schedule (including weekends and holidays): Don't skip meals. Eat a balanced diet. 8 hours of sleep nightly. Minimize stress. Exercise 30 minutes per day. Being overweight is associated with a 5 times increased risk of chronic migraine. Keep well hydrated and drink 6-8 glasses of water per day.   4. Initiate non-pharmacologic measures at the earliest onset of your headache. Rest and quiet environment. Relax and reduce stress. Breathe2Relax is a free app that can instruct you on    some simple relaxtion and breathing techniques. Http://Dawnbuse.com is a    free website that provides teaching videos on relaxation.  Also, there are  many apps that   can be downloaded for "mindful" relaxation.  An app called YOGA NIDRA will help walk you through mindfulness. Another app called Calm can be downloaded to give you a structured mindfulness guide with daily reminders and skill development. Headspace for guided meditation Mindfulness Based Stress Reduction Online Course: www.palousemindfulness.com Cold compresses.   5. Don't wait!! Take the maximum allowable dosage of prescribed medication at the first sign of migraine.   6. Compliance:  Take prescribed medication regularly as directed and at the first sign of a migraine.   7. Communicate:  Call your physician when problems arise, especially if your headaches change, increase in frequency/severity, or become associated with neurological symptoms (weakness, numbness, slurred  speech, etc.). Proceed to emergency room if you experience new or worsening symptoms or symptoms do not resolve, if you have new neurologic symptoms or if headache is severe, or for any concerning symptom.   8. Headache/pain management therapies: Consider various complementary methods, including medication, behavioral therapy, psychological counselling, biofeedback, massage therapy, acupuncture, dry needling, and other modalities.  Such measures may reduce the need for medications. Counseling for pain management, where patients learn to function and ignore/minimize their pain, seems to work very well.   9. Recommend changing family's attention and focus away from patient's headaches. Instead, emphasize daily activities. If first question of day is 'How are your headaches/Do you have a headache today?', then patient will constantly think about headaches, thus making them worse. Goal is to re-direct attention away from headaches, toward daily activities and other distractions.   10. Helpful Websites: www.AmericanHeadacheSociety.org PatentHood.ch www.headaches.org TightMarket.nl www.achenet.org

## 2023-12-21 NOTE — Progress Notes (Addendum)
 PATIENT: Ann Conley DOB: October 24, 1934  REASON FOR VISIT: follow up HISTORY FROM: patient  Virtual Visit via Telephone Note  I connected with Ann Conley on 12/21/23 at  9:30 AM EST by telephone and verified that I am speaking with the correct person using two identifiers.   I discussed the limitations, risks, security and privacy concerns of performing an evaluation and management service by telephone and the availability of in person appointments. I also discussed with the patient that there may be a patient responsible charge related to this service. The patient expressed understanding and agreed to proceed.   History of Present Illness:  12/21/23 Ann Conley is a 89 y.o. female here today for follow up for worsening migraines and visual hallucinations. She was last seen 09/2023 and reported migraines were well controlled but had been having visual hallucinations for about a year. MRI declined. Ophthalmology exam advised.   Since, she reports worsening auras. She continues to have frequent arm numbness and paresthesias of right side of face. Has head pain associated with aura 1-2 times per month. She is taking Nurtec when migraines and aura occur and it does seem to help but symptoms typically return. She continues to note a shadow in her peripheral vision. Usually at night. No auditory symptoms. Eye exam was normal. Carotid US last week normal.   She denies acute confusion. Able to drive and manage home/meds independently. No weakness or changes in gait.   09/20/23 Ann Conley returns for follow up for migraines. She was last seen 01/2023 and doing well on Emgality and Nurtec.   She called 08/2023 reporting PCP advised sooner follow up due to change of location of ice pick pain. She was previously having sharp pains in the top right side of her head. Now describing the same sensation on the right side of her face, below chin and in her neck. Usually at bedtime.  Pain last 1-2 seconds. Spontaneously relieved without meds. She tells me, today, that the ice pick pain has improved and she has not had any episodes in over a week.   She continues Emgality monthly (28th of every month). She reports migraines occur 2-3 times a month. (Previously reported as 2-3 times a week but she denies this is accurate). Usually not debilitating but she avoids most activities due to vision changes. Headache usually lasts about 2 hours and relieved by rest. Nurtec works well but she can only afford 2 tablets per month. Sample tablets have been provided through the office when available. Ibuprofen 800mg  helps.  She tells me today that she has been having hallucinations for 70m-1 year. She describes seeing a shadow in the distance. Like a fairy moving around. Sometimes these events occur when she is having a headache and other times it doesn't. She reports this is different than her normal migraine aura. She also tells me that she can feel a presence around her like someone or something is in the room. She reports sleeping well most nights. She usually goes to bed around 10-11p and wakes around 7-8a. She naps at least once, maybe twice, during the day. Last eye exam over two years ago. She had cataract surgery about 3 years ago. She drinks at least 60-80 ounces of water daily. BP is usually 150-170/70s. She is followed regularly by PCP.    Observations/Objective:  Generalized: Well developed, in no acute distress  Mentation: Alert oriented to time, place, history taking. Follows all commands speech and language  fluent   Assessment and Plan:  89 y.o. year old female  has a past medical history of Anemia, Blood transfusion complicating pregnancy (1961), GERD (gastroesophageal reflux disease), History of chicken pox, Hypertension, Migraine, and TIA (transient ischemic attack). here with    ICD-10-CM   1. Migraine with aura and without status migrainosus, not intractable  G43.109 MR  BRAIN W WO CONTRAST    2. History of transient ischemic attack (TIA)  Z86.73 MR BRAIN W WO CONTRAST    3. Essential hypertension  I10     4. Visual hallucination  R44.1 MR BRAIN W WO CONTRAST     Ann Conley reports continued worsening symptoms of frequent visual and sensory aura associated with headache. Symptoms present at least 15-20 days a month. Severe symptoms 1-2 times a month. Emgality seems less effective. We will start Vyepti infusions every 12 weeks. She will continue Nurtec for abortive therapy. Eye exam reportedly normal. Carotid US normal. BP reportedly well managed on amlodipine. We will order MRI for eval of visual hallucinations to rule out intracranial abnormalities. Adequate hydration and healthy diet advised. Will follow up pending approval for infusions.   Addendum: Vyepti not affordable. She does not qualify for patient assistance. She will continue Emgality for now. We can consider switch to Ajovy or Qulipta in the future if she wishes.   Tried and failed: Emgality, propranolol, metoprolol, valsartan, amlodipine, Nurtec, triptans contraindicated d/t complex migraines and ? TIA, meloxicam, ibuprofen.    Orders Placed This Encounter  Procedures   MR BRAIN W WO CONTRAST    Standing Status:   Future    Expiration Date:   12/20/2024    If indicated for the ordered procedure, I authorize the administration of contrast media per Radiology protocol:   Yes    What is the patient's sedation requirement?:   No Sedation    Does the patient have a pacemaker or implanted devices?:   No    Radiology Contrast Protocol - do NOT remove file path:   [email or web address]    Preferred imaging location?:   External    No orders of the defined types were placed in this encounter.    Follow Up Instructions:  I discussed the assessment and treatment plan with the patient. The patient was provided an opportunity to ask questions and all were answered.  The patient agreed with the plan and demonstrated an understanding of the instructions.   The patient was advised to call back or seek an in-person evaluation if the symptoms worsen or if the condition fails to improve as anticipated.  I provided 30 minutes of non-face-to-face time during this encounter. Patient located at their place of residence during Mychart visit. Provider is in the office.    Shawnie Dapper, NP

## 2023-12-24 ENCOUNTER — Telehealth: Payer: Self-pay | Admitting: *Deleted

## 2023-12-24 NOTE — Telephone Encounter (Signed)
Vyepti new start  complete placed on Amy,NP desk for signature

## 2023-12-25 NOTE — Telephone Encounter (Signed)
Gave signed order below to intrafusion to start working on approval process for pt prior to scheduling.

## 2023-12-26 ENCOUNTER — Other Ambulatory Visit (HOSPITAL_COMMUNITY): Payer: Self-pay

## 2023-12-27 ENCOUNTER — Other Ambulatory Visit (HOSPITAL_COMMUNITY): Payer: Self-pay

## 2023-12-28 ENCOUNTER — Other Ambulatory Visit (HOSPITAL_COMMUNITY): Payer: Self-pay

## 2023-12-28 ENCOUNTER — Other Ambulatory Visit: Payer: Self-pay

## 2023-12-31 ENCOUNTER — Telehealth: Payer: Self-pay | Admitting: Family Medicine

## 2023-12-31 ENCOUNTER — Other Ambulatory Visit (HOSPITAL_COMMUNITY): Payer: Self-pay

## 2023-12-31 NOTE — Telephone Encounter (Signed)
 [employer] Berkley Harvey: [id number] exp. 12/31/23-01/29/24 sent to GI [phone]

## 2024-01-01 ENCOUNTER — Other Ambulatory Visit (HOSPITAL_COMMUNITY): Payer: Self-pay

## 2024-01-02 ENCOUNTER — Other Ambulatory Visit (HOSPITAL_COMMUNITY): Payer: Self-pay

## 2024-01-02 ENCOUNTER — Telehealth: Payer: Self-pay | Admitting: Family Medicine

## 2024-01-02 NOTE — Telephone Encounter (Signed)
 Pt calling to report the pharmacy has informed her that a PA is needed for her  Galcanezumab-gnlm (EMGALITY) 120 MG/ML SOAJ

## 2024-01-02 NOTE — Telephone Encounter (Signed)
 Rx pa team disregard the pa request for emgality

## 2024-01-02 NOTE — Telephone Encounter (Signed)
 Removed emgality from med list since started vyepti

## 2024-01-02 NOTE — Addendum Note (Signed)
 Addended by: Danne Harbor on: 01/02/2024 03:26 PM   Modules accepted: Orders

## 2024-01-03 ENCOUNTER — Other Ambulatory Visit (HOSPITAL_COMMUNITY): Payer: Self-pay

## 2024-01-03 ENCOUNTER — Telehealth: Payer: Self-pay

## 2024-01-03 ENCOUNTER — Other Ambulatory Visit: Payer: Self-pay | Admitting: *Deleted

## 2024-01-03 MED ORDER — EMGALITY 120 MG/ML ~~LOC~~ SOAJ: 120.0000 mg | SUBCUTANEOUS | 2 refills | Status: DC

## 2024-01-03 MED ORDER — EMGALITY 120 MG/ML ~~LOC~~ SOAJ: 120.0000 mg | SUBCUTANEOUS | Status: DC

## 2024-01-03 NOTE — Telephone Encounter (Signed)
 Pharmacy Patient Advocate Encounter   Received notification from Physician's Office that prior authorization for Emgality 120MG /ML auto-injectors (migraine) is required/requested.   Insurance verification completed.   The patient is insured through [employer] .   Per test claim: PA required; PA submitted to above mentioned insurance via CoverMyMeds Key/confirmation #/EOC [id number] Status is pending

## 2024-01-03 NOTE — Telephone Encounter (Addendum)
 Per Amy,NP since its going to be another month for approval for vyepti . Sent refill to pharmacy for emgality . Per Amy,NP since pt is due for [facility] tomorrow ok to give sample . PA needed for Emgality . PA  Emgality in  process. Placed Emgality sample in refrigerator Per Patient will pick up today before 430pm

## 2024-01-03 NOTE — Addendum Note (Signed)
 Addended by: Raynald Kemp A on: 01/03/2024 09:48 AM   Modules accepted: Orders

## 2024-01-03 NOTE — Telephone Encounter (Signed)
 PA request was cancelled yesterday afternoon. No PA was started. Will start now based on encounter updates from today. New encounter will be created with updates.

## 2024-01-04 ENCOUNTER — Other Ambulatory Visit (HOSPITAL_COMMUNITY): Payer: Self-pay

## 2024-01-04 NOTE — Telephone Encounter (Signed)
 Steward Drone from [employer] called to inform the office that the PA has been approved from 01/03/24-02/26 Any questions can be called to [phone] Option 5

## 2024-01-08 ENCOUNTER — Ambulatory Visit (INDEPENDENT_AMBULATORY_CARE_PROVIDER_SITE_OTHER): Payer: Medicare Other | Admitting: *Deleted

## 2024-01-08 DIAGNOSIS — Z0000 Encounter for general adult medical examination without abnormal findings: Secondary | ICD-10-CM

## 2024-01-08 NOTE — Progress Notes (Signed)
 Subjective:   Ann Conley is a 89 y.o. female who presents for Medicare Annual (Subsequent) preventive examination.  Visit Complete: Virtual I connected with  Drexel Iha on 01/08/24 by a audio enabled telemedicine application and verified that I am speaking with the correct person using two identifiers.  Patient Location: Home  Provider Location: Home Office  I discussed the limitations of evaluation and management by telemedicine. The patient expressed understanding and agreed to proceed.  Vital Signs: Because this visit was a virtual/telehealth visit, some criteria may be missing or patient reported. Any vitals not documented were not able to be obtained and vitals that have been documented are patient reported.   Cardiac Risk Factors include: advanced age (>44men, >78 women);family history of premature cardiovascular disease;hypertension     Objective:    Today's Vitals   01/08/24 0935  PainSc: 5    There is no height or weight on file to calculate BMI.     01/08/2024    9:42 AM 10/05/2022   11:32 AM 07/20/2021   10:06 AM 01/27/2021    5:35 PM 08/11/2020    5:00 PM 08/10/2018    1:03 PM 09/20/2017   11:03 AM  Advanced Directives  Does Patient Have a Medical Advance Directive? Yes Yes Yes Yes Yes Yes Yes  Type of Advance Directive Living will Healthcare Power of [employer] of [place];Living will   Healthcare Power of [place];Living will Living will  Does patient want to make changes to medical advance directive?   No - Patient declined No - Patient declined No - Patient declined    Copy of Healthcare Power of Attorney in Chart?  No - copy requested No - copy requested   No - copy requested     Current Medications (verified) Outpatient Encounter Medications as of 01/08/2024  Medication Sig   amLODipine (NORVASC) 10 MG tablet Take 1 tablet (10 mg total) by mouth daily.   aspirin 81 MG tablet Take 81 mg by mouth every evening.     Galcanezumab-gnlm (EMGALITY) 120 MG/ML SOAJ Inject 120 mg into the skin every 30 (thirty) days.   ibuprofen (ADVIL) 800 MG tablet Take 1 tablet (800 mg total) by mouth every 8 (eight) hours as needed for headache.   Magnesium 250 MG TABS Take 250 mg by mouth as needed.   omeprazole (PRILOSEC) 20 MG capsule Take 1 capsule (20 mg total) by mouth daily.   Rimegepant Sulfate (NURTEC) 75 MG TBDP Take 1 tablet (75 mg total) by mouth daily as needed at start of migraine (no more than 1 tablet in 24 hours or 8 per month).   Ascorbic Acid (VITAMIN C PO) Take by mouth. (Patient not taking: Reported on 01/08/2024)   Galcanezumab-gnlm (EMGALITY) 120 MG/ML SOAJ Inject 120 mg into the skin every 30 (thirty) days. (Patient not taking: Reported on 01/08/2024)   multivitamin-lutein (OCUVITE-LUTEIN) CAPS capsule Take 1 capsule by mouth daily. (Patient not taking: Reported on 11/13/2023)   traMADol (ULTRAM) 50 MG tablet Take 1 tablet (50 mg total) by mouth every 6 (six) hours as needed. (Patient not taking: Reported on 01/08/2024)   vitamin B-12 (CYANOCOBALAMIN) 100 MCG tablet Take 100 mcg by mouth daily. (Patient not taking: Reported on 11/13/2023)   No facility-administered encounter medications on file as of 01/08/2024.    Allergies (verified) Codeine   History: Past Medical History:  Diagnosis Date   Anemia    Blood transfusion complicating pregnancy 1961   GERD (gastroesophageal reflux disease)  History of chicken pox    Hypertension    Migraine    occular   TIA (transient ischemic attack)    Past Surgical History:  Procedure Laterality Date   BREAST BIOPSY     CHOLECYSTECTOMY     PARTIAL HYSTERECTOMY     Family History  Problem Relation Age of Onset   Breast cancer Mother    Heart disease Father    Heart disease Sister        pacemaker   Diabetes Sister    Social History   Socioeconomic History   Marital status: Married    Spouse name: Not on file   Number of children: 5   Years of  education: Not on file   Highest education level: Bachelor's degree (e.g., BA, AB, BS)  Occupational History   Not on file  Tobacco Use   Smoking status: Never   Smokeless tobacco: Never  Vaping Use   Vaping status: Never Used  Substance and Sexual Activity   Alcohol use: Yes    Comment: rare   Drug use: No   Sexual activity: Not on file  Other Topics Concern   Not on file  Social History Narrative   Lives with husband.  5 children.  Education BA.   Right handed   Caffeine use: 1 cup coffee every morning   Social Drivers of Health   Financial Resource Strain: Low Risk  (01/08/2024)   Overall Financial Resource Strain (CARDIA)    Difficulty of Paying Living Expenses: Not hard at all  Food Insecurity: No Food Insecurity (01/08/2024)   Hunger Vital Sign    Worried About Running Out of Food in the Last Year: Never true    Ran Out of Food in the Last Year: Never true  Transportation Needs: No Transportation Needs (01/08/2024)   PRAPARE - Administrator, Civil Service (Medical): No    Lack of Transportation (Non-Medical): No  Physical Activity: Inactive (01/08/2024)   Exercise Vital Sign    Days of Exercise per Week: 0 days    Minutes of Exercise per Session: 0 min  Stress: No Stress Concern Present (01/08/2024)   [employer] of Occupational Health - Occupational Stress Questionnaire    Feeling of Stress : Not at all  Social Connections: Socially Integrated (01/08/2024)   Social Connection and Isolation Panel [NHANES]    Frequency of Communication with Friends and Family: More than three times a week    Frequency of Social Gatherings with Friends and Family: Three times a week    Attends Religious Services: More than 4 times per year    Active Member of Clubs or Organizations: Yes    Attends Engineer, structural: More than 4 times per year    Marital Status: Married    Tobacco Counseling Counseling given: Not Answered   Clinical Intake:  Pre-visit  preparation completed: Yes  Pain : 0-10 Pain Score: 5  Pain Location: Knee Pain Descriptors / Indicators: Burning, Constant, Aching, Dull Pain Onset: More than a month ago Pain Frequency: Constant     Diabetes: No  How often do you need to have someone help you when you read instructions, pamphlets, or other written materials from your doctor or pharmacy?: 1 - Never  Interpreter Needed?: No  Information entered by :: Remi Haggard LPN   Activities of Daily Living    01/08/2024    9:41 AM 02/09/2023    2:32 PM  In your present state of health, do  you have any difficulty performing the following activities:  Hearing? 1 0  Vision? 0 0  Difficulty concentrating or making decisions? 1 0  Walking or climbing stairs? 1 1  Dressing or bathing? 0 0  Doing errands, shopping? 0   Preparing Food and eating ? N   Using the Toilet? N   In the past six months, have you accidently leaked urine? Y   Do you have problems with loss of bowel control? N   Managing your Medications? N   Managing your Finances? N   Housekeeping or managing your Housekeeping? N     Patient Care Team: Sheliah Hatch, MD as PCP - General (Family Medicine) Runell Gess, MD as Consulting Physician (Cardiology) Suanne Marker, MD as Consulting Physician (Neurology) Nelson Chimes, MD as Consulting Physician (Ophthalmology) Erroll Luna, [facility] (Inactive) (Pharmacist)  Indicate any recent Medical Services you may have received from other than [facility] providers in the past year (date may be approximate).     Assessment:   This is a routine wellness examination for [facility].  Hearing/Vision screen Hearing Screening - Comments:: Does have some hearing issues  No hearing aids Vision Screening - Comments:: Up to date Unsure of name [facility]   Goals Addressed             This Visit's Progress    patient   On track    Maintain current health by staying active.      Patient Stated   On  track    No goals     Patient Stated       Loose weight        Depression Screen    01/08/2024    9:45 AM 11/13/2023    1:10 PM 08/13/2023    2:07 PM 02/09/2023    2:32 PM 10/05/2022   11:41 AM 12/28/2021   12:08 PM 08/16/2021    1:56 PM  PHQ 2/9 Scores  PHQ - 2 Score 0 0 0 0 0 0 0  PHQ- 9 Score 1 1  0 0 0     Fall Risk    01/08/2024    9:40 AM 11/13/2023    1:10 PM 08/13/2023    2:07 PM 02/09/2023    2:32 PM 12/12/2022   11:46 AM  Fall Risk   Falls in the past year? 0 0 0 0 0  Number falls in past yr: 0 0 0 0 0  Injury with Fall? 0 0 0 0 0  Risk for fall due to :  No Fall Risks No Fall Risks No Fall Risks Impaired balance/gait  Follow up Falls evaluation completed;Education provided;Falls prevention discussed Falls evaluation completed Falls evaluation completed Falls evaluation completed     MEDICARE RISK AT HOME: Medicare Risk at Home Any stairs in or around the home?: Yes If so, are there any without handrails?: No Home free of loose throw rugs in walkways, pet beds, electrical cords, etc?: Yes Adequate lighting in your home to reduce risk of falls?: Yes Life alert?: No Use of a cane, walker or w/c?: Yes Grab bars in the bathroom?: Yes Shower chair or bench in shower?: Yes Elevated toilet seat or a handicapped toilet?: Yes  TIMED UP AND GO:  Was the test performed?  No    Cognitive Function:    09/20/2017   11:05 AM  MMSE - Mini Mental State Exam  Orientation to time 5  Orientation to Place 5  Registration 3  Attention/ Calculation  5  Recall 2  Language- name 2 objects 2  Language- repeat 1  Language- follow 3 step command 3  Language- read & follow direction 1  Write a sentence 1  Copy design 1  Total score 29        01/08/2024    9:43 AM 10/05/2022   11:37 AM  6CIT Screen  What Year? 0 points 0 points  What month? 0 points 0 points  What time? 0 points   Count back from 20 0 points 0 points  Months in reverse 0 points 2 points  Repeat phrase 0  points 0 points  Total Score 0 points     Immunizations Immunization History  Administered Date(s) Administered   Moderna Sars-Covid-2 Vaccination 01/23/2020, 02/23/2020   Pneumococcal Conjugate-13 09/17/2014   Pneumococcal Polysaccharide-23 09/09/2012   Tdap 09/01/2016    TDAP status: Up to date  Flu Vaccine status: Declined, Education has been provided regarding the importance of this vaccine but patient still declined. Advised may receive this vaccine at local pharmacy or Health Dept. Aware to provide a copy of the vaccination record if obtained from local pharmacy or Health Dept. Verbalized acceptance and understanding.  Pneumococcal vaccine status: Up to date  Covid-19 vaccine status: Information provided on how to obtain vaccines.   Qualifies for Shingles Vaccine? Yes   Zostavax completed No   Shingrix Completed?: No.    Education has been provided regarding the importance of this vaccine. Patient has been advised to call insurance company to determine out of pocket expense if they have not yet received this vaccine. Advised may also receive vaccine at local pharmacy or Health Dept. Verbalized acceptance and understanding.  Screening Tests Health Maintenance  Topic Date Due   Zoster Vaccines- Shingrix (1 of 2) Never done   DEXA SCAN  Never done   COVID-19 Vaccine (3 - 2024-25 season) 07/08/2023   INFLUENZA VACCINE  02/04/2024 (Originally 06/07/2023)   Medicare Annual Wellness (AWV)  01/07/2025   DTaP/Tdap/Td (2 - Td or Tdap) 09/01/2026   Pneumonia Vaccine 47+ Years old  Completed   HPV VACCINES  Aged Out    Health Maintenance  Health Maintenance Due  Topic Date Due   Zoster Vaccines- Shingrix (1 of 2) Never done   DEXA SCAN  Never done   COVID-19 Vaccine (3 - 2024-25 season) 07/08/2023    Colorectal cancer screening: No longer required.   Mammogram status: No longer required due to  .  Bone Density  declined  Lung Cancer Screening: (Low Dose CT Chest  recommended if Age 6-80 years, 20 pack-year currently smoking OR have quit w/in 15years.) does not qualify.   Lung Cancer Screening Referral:   Additional Screening:  Hepatitis C Screening: does not qualify;  Vision Screening: Recommended annual ophthalmology exams for early detection of glaucoma and other disorders of the eye. Is the patient up to date with their annual eye exam?  Yes  Who is the provider or what is the name of the office in which the patient attends annual eye exams? [facility] [address]  unsure of name If pt is not established with a provider, would they like to be referred to a provider to establish care? No .   Dental Screening: Recommended annual dental exams for proper oral hygiene  Community Resource Referral / Chronic Care Management: CRR required this visit?  No   CCM required this visit?  No     Plan:     I have personally reviewed and noted  the following in the patient's chart:   Medical and social history Use of alcohol, tobacco or illicit drugs  Current medications and supplements including opioid prescriptions. Patient is not currently taking opioid prescriptions. Functional ability and status Nutritional status Physical activity Advanced directives List of other physicians Hospitalizations, surgeries, and ER visits in previous 12 months Vitals Screenings to include cognitive, depression, and falls Referrals and appointments  In addition, I have reviewed and discussed with patient certain preventive protocols, quality metrics, and best practice recommendations. A written personalized care plan for preventive services as well as general preventive health recommendations were provided to patient.     Remi Haggard, LPN   4/0/3474   After Visit Summary: (MyChart) Due to this being a telephonic visit, the after visit summary with patients personalized plan was offered to patient via MyChart   Nurse Notes:

## 2024-01-08 NOTE — Patient Instructions (Signed)
 Ann Conley , Thank you for taking time to come for your Medicare Wellness Visit. I appreciate your ongoing commitment to your health goals. Please review the following plan we discussed and let me know if I can assist you in the future.   Screening recommendations/referrals: Colonoscopy: no longer required Mammogram: no longer required Bone Density: Education provided Recommended yearly ophthalmology/optometry visit for glaucoma screening and checkup Recommended yearly dental visit for hygiene and checkup  Vaccinations: Influenza vaccine:  Pneumococcal vaccine: up to date Tdap vaccine: up to date     Preventive Care 65 Years and Older, Female Preventive care refers to lifestyle choices and visits with your health care provider that can promote health and wellness. What does preventive care include? A yearly physical exam. This is also called an annual well check. Dental exams once or twice a year. Routine eye exams. Ask your health care provider how often you should have your eyes checked. Personal lifestyle choices, including: Daily care of your teeth and gums. Regular physical activity. Eating a healthy diet. Avoiding tobacco and drug use. Limiting alcohol use. Practicing safe sex. Taking low-dose aspirin every day. Taking vitamin and mineral supplements as recommended by your health care provider. What happens during an annual well check? The services and screenings done by your health care provider during your annual well check will depend on your age, overall health, lifestyle risk factors, and family history of disease. Counseling  Your health care provider may ask you questions about your: Alcohol use. Tobacco use. Drug use. Emotional well-being. Home and relationship well-being. Sexual activity. Eating habits. History of falls. Memory and ability to understand (cognition). Work and work Astronomer. Reproductive health. Screening  You may have the following  tests or measurements: Height, weight, and BMI. Blood pressure. Lipid and cholesterol levels. These may be checked every 5 years, or more frequently if you are over 55 years old. Skin check. Lung cancer screening. You may have this screening every year starting at age 108 if you have a 30-pack-year history of smoking and currently smoke or have quit within the past 15 years. Fecal occult blood test (FOBT) of the stool. You may have this test every year starting at age 69. Flexible sigmoidoscopy or colonoscopy. You may have a sigmoidoscopy every 5 years or a colonoscopy every 10 years starting at age 79. Hepatitis C blood test. Hepatitis B blood test. Sexually transmitted disease (STD) testing. Diabetes screening. This is done by checking your blood sugar (glucose) after you have not eaten for a while (fasting). You may have this done every 1-3 years. Bone density scan. This is done to screen for osteoporosis. You may have this done starting at age 53. Mammogram. This may be done every 1-2 years. Talk to your health care provider about how often you should have regular mammograms. Talk with your health care provider about your test results, treatment options, and if necessary, the need for more tests. Vaccines  Your health care provider may recommend certain vaccines, such as: Influenza vaccine. This is recommended every year. Tetanus, diphtheria, and acellular pertussis (Tdap, Td) vaccine. You may need a Td booster every 10 years. Zoster vaccine. You may need this after age 46. Pneumococcal 13-valent conjugate (PCV13) vaccine. One dose is recommended after age 38. Pneumococcal polysaccharide (PPSV23) vaccine. One dose is recommended after age 32. Talk to your health care provider about which screenings and vaccines you need and how often you need them. This information is not intended to replace advice given to  you by your health care provider. Make sure you discuss any questions you have with  your health care provider. Document Released: 11/19/2015 Document Revised: 07/12/2016 Document Reviewed: 08/24/2015 Elsevier Interactive Patient Education  2017 [employer].  Fall Prevention in the Home Falls can cause injuries. They can happen to people of all ages. There are many things you can do to make your home safe and to help prevent falls. What can I do on the outside of my home? Regularly fix the edges of walkways and driveways and fix any cracks. Remove anything that might make you trip as you walk through a door, such as a raised step or threshold. Trim any bushes or trees on the path to your home. Use bright outdoor lighting. Clear any walking paths of anything that might make someone trip, such as rocks or tools. Regularly check to see if handrails are loose or broken. Make sure that both sides of any steps have handrails. Any raised decks and porches should have guardrails on the edges. Have any leaves, snow, or ice cleared regularly. Use sand or salt on walking paths during winter. Clean up any spills in your garage right away. This includes oil or grease spills. What can I do in the bathroom? Use night lights. Install grab bars by the toilet and in the tub and shower. Do not use towel bars as grab bars. Use non-skid mats or decals in the tub or shower. If you need to sit down in the shower, use a plastic, non-slip stool. Keep the floor dry. Clean up any water that spills on the floor as soon as it happens. Remove soap buildup in the tub or shower regularly. Attach bath mats securely with double-sided non-slip rug tape. Do not have throw rugs and other things on the floor that can make you trip. What can I do in the bedroom? Use night lights. Make sure that you have a light by your bed that is easy to reach. Do not use any sheets or blankets that are too big for your bed. They should not hang down onto the floor. Have a firm chair that has side arms. You can use this  for support while you get dressed. Do not have throw rugs and other things on the floor that can make you trip. What can I do in the kitchen? Clean up any spills right away. Avoid walking on wet floors. Keep items that you use a lot in easy-to-reach places. If you need to reach something above you, use a strong step stool that has a grab bar. Keep electrical cords out of the way. Do not use floor polish or wax that makes floors slippery. If you must use wax, use non-skid floor wax. Do not have throw rugs and other things on the floor that can make you trip. What can I do with my stairs? Do not leave any items on the stairs. Make sure that there are handrails on both sides of the stairs and use them. Fix handrails that are broken or loose. Make sure that handrails are as long as the stairways. Check any carpeting to make sure that it is firmly attached to the stairs. Fix any carpet that is loose or worn. Avoid having throw rugs at the top or bottom of the stairs. If you do have throw rugs, attach them to the floor with carpet tape. Make sure that you have a light switch at the top of the stairs and the bottom of the stairs.  If you do not have them, ask someone to add them for you. What else can I do to help prevent falls? Wear shoes that: Do not have high heels. Have rubber bottoms. Are comfortable and fit you well. Are closed at the toe. Do not wear sandals. If you use a stepladder: Make sure that it is fully opened. Do not climb a closed stepladder. Make sure that both sides of the stepladder are locked into place. Ask someone to hold it for you, if possible. Clearly mark and make sure that you can see: Any grab bars or handrails. First and last steps. Where the edge of each step is. Use tools that help you move around (mobility aids) if they are needed. These include: Canes. Walkers. Scooters. Crutches. Turn on the lights when you go into a dark area. Replace any light bulbs as  soon as they burn out. Set up your furniture so you have a clear path. Avoid moving your furniture around. If any of your floors are uneven, fix them. If there are any pets around you, be aware of where they are. Review your medicines with your doctor. Some medicines can make you feel dizzy. This can increase your chance of falling. Ask your doctor what other things that you can do to help prevent falls. This information is not intended to replace advice given to you by your health care provider. Make sure you discuss any questions you have with your health care provider. Document Released: 08/19/2009 Document Revised: 03/30/2016 Document Reviewed: 11/27/2014 Elsevier Interactive Patient Education  2017 [employer].

## 2024-01-16 ENCOUNTER — Encounter: Payer: Self-pay | Admitting: Family Medicine

## 2024-01-16 ENCOUNTER — Telehealth: Payer: Self-pay

## 2024-01-16 NOTE — Telephone Encounter (Signed)
-----   Message from Amy Lomax sent at 01/16/2024  2:52 PM EDT ----- Patient is aware of cost to start Vyepti and told Jeanice Lim she was going to continue [employer] PLease let her know we will wait to see what MRI shows and we can consider switching to Ajovy injection or Qulipta tablets if headaches do not settle down. TY!

## 2024-01-16 NOTE — Telephone Encounter (Signed)
 Called and relayed msg. Pt voiced gratitude and understanding

## 2024-01-23 ENCOUNTER — Ambulatory Visit: Payer: Medicare Other | Admitting: Family Medicine

## 2024-01-30 ENCOUNTER — Other Ambulatory Visit (HOSPITAL_COMMUNITY): Payer: Self-pay

## 2024-01-30 ENCOUNTER — Encounter: Payer: Self-pay | Admitting: Family Medicine

## 2024-01-30 ENCOUNTER — Other Ambulatory Visit: Payer: Self-pay | Admitting: Family Medicine

## 2024-01-30 NOTE — Telephone Encounter (Signed)
 Requested Prescriptions   Pending Prescriptions Disp Refills   traMADol (ULTRAM) 50 MG tablet 30 tablet 0    Sig: Take 1 tablet (50 mg total) by mouth every 6 (six) hours as needed.    In patients chart it is reported patient not taking on 01/08/2024 Date of patient request: 01/30/2024 Last office visit: 08/13/2023 Upcoming visit: no upcoming appointments  Date of last refill: 11/08/2023 Last refill amount: 30 tab 0 refills

## 2024-01-31 ENCOUNTER — Other Ambulatory Visit: Payer: Self-pay

## 2024-01-31 ENCOUNTER — Other Ambulatory Visit (HOSPITAL_COMMUNITY): Payer: Self-pay

## 2024-01-31 MED ORDER — TRAMADOL HCL 50 MG PO TABS
50.0000 mg | ORAL_TABLET | Freq: Four times a day (QID) | ORAL | 0 refills | Status: DC | PRN
  Filled 2024-01-31: qty 30, 8d supply, fill #0

## 2024-02-02 ENCOUNTER — Other Ambulatory Visit: Payer: Medicare Other

## 2024-02-04 ENCOUNTER — Ambulatory Visit: Admitting: Family Medicine

## 2024-02-07 ENCOUNTER — Other Ambulatory Visit (HOSPITAL_COMMUNITY): Payer: Self-pay

## 2024-02-07 ENCOUNTER — Other Ambulatory Visit: Payer: Self-pay | Admitting: Family Medicine

## 2024-02-07 DIAGNOSIS — G43109 Migraine with aura, not intractable, without status migrainosus: Secondary | ICD-10-CM

## 2024-02-07 MED ORDER — IBUPROFEN 800 MG PO TABS
800.0000 mg | ORAL_TABLET | Freq: Three times a day (TID) | ORAL | 0 refills | Status: DC | PRN
  Filled 2024-02-07: qty 30, 10d supply, fill #0

## 2024-02-07 NOTE — Telephone Encounter (Signed)
 Requested Prescriptions   Pending Prescriptions Disp Refills   ibuprofen (ADVIL) 800 MG tablet 30 tablet 0    Sig: Take 1 tablet (800 mg total) by mouth every 8 (eight) hours as needed for headache.     Date of patient request: 02/07/2024 Last office visit: 08/13/2023 Upcoming visit: Visit date not found Date of last refill: 08/30/2023 Last refill amount: 30 tabs 0 refills     Want to make sure you are okay with refilling this ?

## 2024-02-11 ENCOUNTER — Other Ambulatory Visit: Payer: Self-pay | Admitting: Family Medicine

## 2024-02-11 ENCOUNTER — Other Ambulatory Visit: Payer: Self-pay

## 2024-02-11 ENCOUNTER — Other Ambulatory Visit (HOSPITAL_COMMUNITY): Payer: Self-pay

## 2024-02-11 DIAGNOSIS — K219 Gastro-esophageal reflux disease without esophagitis: Secondary | ICD-10-CM

## 2024-02-11 MED ORDER — OMEPRAZOLE 20 MG PO CPDR
20.0000 mg | DELAYED_RELEASE_CAPSULE | Freq: Every day | ORAL | 1 refills | Status: DC
Start: 2024-02-11 — End: 2024-05-19
  Filled 2024-02-11: qty 90, 90d supply, fill #0
  Filled 2024-05-10: qty 90, 90d supply, fill #1

## 2024-02-13 ENCOUNTER — Other Ambulatory Visit (HOSPITAL_COMMUNITY): Payer: Self-pay

## 2024-02-13 ENCOUNTER — Telehealth: Payer: Self-pay | Admitting: Pharmacist

## 2024-02-13 NOTE — Telephone Encounter (Signed)
 Pharmacy Patient Advocate Encounter   Received notification from CoverMyMeds that prior authorization for Nurtec 75MG  dispersible tablets is due for renewal.   Insurance verification completed.   The patient is insured through [facility].  Action: The current 30 day co-pay is, $45.  No PA needed at this time. This test claim was processed through [facility]- copay amounts may vary at other pharmacies due to pharmacy/plan contracts, or as the patient moves through the different stages of their insurance plan.

## 2024-02-14 ENCOUNTER — Ambulatory Visit (INDEPENDENT_AMBULATORY_CARE_PROVIDER_SITE_OTHER): Admitting: Family Medicine

## 2024-02-14 ENCOUNTER — Encounter: Payer: Self-pay | Admitting: Family Medicine

## 2024-02-14 VITALS — BP 168/82 | HR 80 | Temp 98.7°F | Ht 59.0 in | Wt 227.1 lb

## 2024-02-14 DIAGNOSIS — R0602 Shortness of breath: Secondary | ICD-10-CM

## 2024-02-14 DIAGNOSIS — R5383 Other fatigue: Secondary | ICD-10-CM

## 2024-02-14 DIAGNOSIS — M549 Dorsalgia, unspecified: Secondary | ICD-10-CM | POA: Diagnosis not present

## 2024-02-14 LAB — CBC WITH DIFFERENTIAL/PLATELET
Basophils Absolute: 0.1 10*3/uL (ref 0.0–0.1)
Basophils Relative: 0.8 % (ref 0.0–3.0)
Eosinophils Absolute: 0.1 10*3/uL (ref 0.0–0.7)
Eosinophils Relative: 2 % (ref 0.0–5.0)
HCT: 40.7 % (ref 36.0–46.0)
Hemoglobin: 13.4 g/dL (ref 12.0–15.0)
Lymphocytes Relative: 19.4 % (ref 12.0–46.0)
Lymphs Abs: 1.3 10*3/uL (ref 0.7–4.0)
MCHC: 33.1 g/dL (ref 30.0–36.0)
MCV: 85.9 fl (ref 78.0–100.0)
Monocytes Absolute: 0.5 10*3/uL (ref 0.1–1.0)
Monocytes Relative: 7.2 % (ref 3.0–12.0)
Neutro Abs: 4.8 10*3/uL (ref 1.4–7.7)
Neutrophils Relative %: 70.6 % (ref 43.0–77.0)
Platelets: 254 10*3/uL (ref 150.0–400.0)
RBC: 4.73 Mil/uL (ref 3.87–5.11)
RDW: 14.6 % (ref 11.5–15.5)
WBC: 6.8 10*3/uL (ref 4.0–10.5)

## 2024-02-14 LAB — BASIC METABOLIC PANEL WITH GFR
BUN: 22 mg/dL (ref 6–23)
CO2: 29 meq/L (ref 19–32)
Calcium: 9.4 mg/dL (ref 8.4–10.5)
Chloride: 100 meq/L (ref 96–112)
Creatinine, Ser: 0.63 mg/dL (ref 0.40–1.20)
GFR: 79.81 mL/min (ref >60.00)
Glucose, Bld: 100 mg/dL — ABNORMAL HIGH (ref 70–99)
Potassium: 4.2 meq/L (ref 3.5–5.1)
Sodium: 137 meq/L (ref 135–145)

## 2024-02-14 LAB — HEPATIC FUNCTION PANEL
ALT: 12 U/L (ref 0–35)
AST: 13 U/L (ref 0–37)
Albumin: 4.5 g/dL (ref 3.5–5.2)
Alkaline Phosphatase: 111 U/L (ref 39–117)
Bilirubin, Direct: 0.1 mg/dL (ref 0.0–0.3)
Total Bilirubin: 0.4 mg/dL (ref 0.2–1.2)
Total Protein: 7 g/dL (ref 6.0–8.3)

## 2024-02-14 LAB — TSH: TSH: 3.04 u[IU]/mL (ref 0.35–5.50)

## 2024-02-14 LAB — VITAMIN D 25 HYDROXY (VIT D DEFICIENCY, FRACTURES): VITD: 21.51 ng/mL — ABNORMAL LOW (ref 30.00–100.00)

## 2024-02-14 LAB — B12 AND FOLATE PANEL
Folate: 9.8 ng/mL (ref >5.9)
Vitamin B-12: 591 pg/mL (ref 211–911)

## 2024-02-14 NOTE — Progress Notes (Signed)
   Subjective:    Patient ID: Ann Conley, female    DOB: 05/20/1934, 89 y.o.   MRN: [id number]  HPI Back pain- husband fell on top of her ~2 weeks ago.  Was having knee pain initially but this is improving.  Was also having back pain but this is improving- particularly if she is able to sit down, 'it goes away'.    SOB/fatigue- pt reports SOB w/ exertion.  Pt reports she is able to do dishes and cook.  Will sit down and rest as needed.  Feels that she is able to recover.  Admits to being emotionally and physically exhausted as she tries to care for her ailing husband.  Has good family support but doesn't utilize their help as much as she should.   Review of Systems For ROS see HPI     Objective:   Physical Exam Vitals reviewed.  Constitutional:      General: She is not in acute distress.    Appearance: She is well-developed.  HENT:     Head: Normocephalic and atraumatic.  Eyes:     Conjunctiva/sclera: Conjunctivae normal.     Pupils: Pupils are equal, round, and reactive to light.  Neck:     Thyroid : No thyromegaly.  Cardiovascular:     Rate and Rhythm: Normal rate and regular rhythm.     Heart sounds: Normal heart sounds. No murmur heard. Pulmonary:     Effort: Pulmonary effort is normal. No respiratory distress.     Breath sounds: Normal breath sounds.  Abdominal:     General: There is no distension.     Palpations: Abdomen is soft.     Tenderness: There is no abdominal tenderness.  Musculoskeletal:     Cervical back: Normal range of motion and neck supple.  Lymphadenopathy:     Cervical: No cervical adenopathy.  Skin:    General: Skin is warm and dry.  Neurological:     Mental Status: She is alert and oriented to person, place, and time.  Psychiatric:        Behavior: Behavior normal.           Assessment & Plan:  SOB/Fatigue- new.  She reports she has SOB w/ exertion.  Suspect this is multifactorial- obesity, deconditioning, emotional fatigue, age.  She  is able to recover quickly when she sits to rest and feels she is able to do things around the house.  Will check labs to assess for metabolic causes of fatigue or vitamin deficiencies.  She admits she is emotionally exhausted and I encouraged her to ask for more help from willing family.  Will follow.  Back pain- improving after husband fell on her.  Pain is resolved at rest, only minor discomfort when moving- which is considerably better than last week.  No further w/u at this time.

## 2024-02-14 NOTE — Patient Instructions (Signed)
 Follow up as needed or as scheduled We'll notify you of your lab results and make any changes if needed Make sure you are taking care of YOU! Make sure you are eating regularly, resting when you need to rest, setting boundaries to avoid getting in situations that can cause harm, etc This is an incredibly difficult and emotional time- you are amazing!! Call with any questions or concerns Stay Safe!  Stay Healthy! Hang in there!!!

## 2024-02-15 ENCOUNTER — Encounter: Payer: Self-pay | Admitting: Family Medicine

## 2024-02-15 ENCOUNTER — Telehealth: Payer: Self-pay

## 2024-02-15 ENCOUNTER — Other Ambulatory Visit: Payer: Self-pay

## 2024-02-15 MED ORDER — VITAMIN D (ERGOCALCIFEROL) 1.25 MG (50000 UNIT) PO CAPS: 50000.0000 [IU] | ORAL_CAPSULE | ORAL | 0 refills | Status: AC

## 2024-02-15 NOTE — Telephone Encounter (Signed)
-----   Message from Neena Rhymes sent at 02/15/2024  7:40 AM EDT ----- Vit D is low.  Based on this, we need to start 50,000 units weekly x12 weeks in addition to daily OTC supplement of at least 2000 units.   Remainder of labs are stable and look good!

## 2024-02-15 NOTE — Telephone Encounter (Signed)
 Pt has been notified Vitamin D has been sent in

## 2024-03-05 NOTE — Assessment & Plan Note (Signed)
 Ongoing issue.  Is likely contributing to her SOB as she is deconditioned.  She is able to regain her breath quickly once she is able to sit.  Encouraged low carb diet.  Will follow.

## 2024-03-12 ENCOUNTER — Other Ambulatory Visit (HOSPITAL_COMMUNITY): Payer: Self-pay

## 2024-03-12 ENCOUNTER — Other Ambulatory Visit: Payer: Self-pay | Admitting: Family Medicine

## 2024-03-13 ENCOUNTER — Other Ambulatory Visit (HOSPITAL_COMMUNITY): Payer: Self-pay

## 2024-03-13 MED ORDER — NURTEC 75 MG PO TBDP
75.0000 mg | ORAL_TABLET | Freq: Every day | ORAL | 1 refills | Status: DC | PRN
  Filled 2024-03-13 – 2024-03-14 (×2): qty 8, 30d supply, fill #0

## 2024-03-13 NOTE — Telephone Encounter (Signed)
 Last seen on 12/21/23 Follow up scheduled on 04/09/24

## 2024-03-14 ENCOUNTER — Telehealth: Payer: Self-pay | Admitting: Pharmacist

## 2024-03-14 ENCOUNTER — Other Ambulatory Visit (HOSPITAL_COMMUNITY): Payer: Self-pay

## 2024-03-14 ENCOUNTER — Other Ambulatory Visit: Payer: Self-pay

## 2024-03-14 NOTE — Telephone Encounter (Signed)
 Pharmacy Patient Advocate Encounter   Received notification from CoverMyMeds that prior authorization for Nurtec 75MG  dispersible tablets is required/requested.   Insurance verification completed.   The patient is insured through [facility] .   Per test claim: PA required; PA submitted to above mentioned insurance via CoverMyMeds Key/confirmation #/EOC [facility] Status is pending

## 2024-03-14 NOTE — Telephone Encounter (Signed)
 Pharmacy Patient Advocate Encounter  Received notification from [facility] that Prior Authorization for Nurtec 75MG  dispersible tablets has been APPROVED from 03-14-2024 to 03-14-2025   PA #/Case ID/Reference #: [facility]

## 2024-03-25 ENCOUNTER — Other Ambulatory Visit: Payer: Self-pay | Admitting: Family Medicine

## 2024-03-25 ENCOUNTER — Other Ambulatory Visit (HOSPITAL_COMMUNITY): Payer: Self-pay

## 2024-03-28 ENCOUNTER — Other Ambulatory Visit (HOSPITAL_COMMUNITY): Payer: Self-pay

## 2024-04-01 ENCOUNTER — Ambulatory Visit (INDEPENDENT_AMBULATORY_CARE_PROVIDER_SITE_OTHER): Admitting: Family Medicine

## 2024-04-01 ENCOUNTER — Encounter: Payer: Self-pay | Admitting: Family Medicine

## 2024-04-01 ENCOUNTER — Other Ambulatory Visit (HOSPITAL_COMMUNITY): Payer: Self-pay

## 2024-04-01 VITALS — BP 136/82 | HR 76 | Temp 98.3°F | Wt 231.0 lb

## 2024-04-01 DIAGNOSIS — G43109 Migraine with aura, not intractable, without status migrainosus: Secondary | ICD-10-CM

## 2024-04-01 DIAGNOSIS — B029 Zoster without complications: Secondary | ICD-10-CM

## 2024-04-01 MED ORDER — VALACYCLOVIR HCL 1 G PO TABS: 1000.0000 mg | ORAL_TABLET | Freq: Three times a day (TID) | ORAL | 0 refills | Status: DC

## 2024-04-01 MED ORDER — IBUPROFEN 800 MG PO TABS: 800.0000 mg | ORAL_TABLET | Freq: Three times a day (TID) | ORAL | 1 refills | Status: DC | PRN

## 2024-04-01 NOTE — Patient Instructions (Signed)
 Follow up as needed or as scheduled START the Valacyclovir  (Valtrex ) as directed Your burning pain that feels like a rash would, is consistent w/ shingles even though the rash is not present Call with any questions or concerns Hang in there!!

## 2024-04-01 NOTE — Addendum Note (Signed)
 Addended by: Draxton Luu E on: 04/01/2024 09:50 AM   Modules accepted: Orders

## 2024-04-01 NOTE — Progress Notes (Signed)
   Subjective:    Patient ID: Ann Conley, female    DOB: 07/04/1934, 89 y.o.   MRN: [id number]  HPI Rash- pt reports sxs started Saturday.  L lower back.  'something started bothering me- like what a rash would feel like, but there's no rash there'.  Pt is under considerable stress.  Has burning sensation.   Review of Systems For ROS see HPI     Objective:   Physical Exam Vitals reviewed.  Constitutional:      General: She is not in acute distress.    Appearance: Normal appearance. She is not ill-appearing.  Skin:    General: Skin is warm and dry.     Findings: No erythema, lesion or rash.  Neurological:     General: No focal deficit present.     Mental Status: She is alert and oriented to person, place, and time. Mental status is at baseline.  Psychiatric:        Mood and Affect: Mood normal.        Behavior: Behavior normal.        Thought Content: Thought content normal.           Assessment & Plan:  Shingles- new.  Pt does not have a rash at this time but given the stress that she's under and the burning sensation of her L lower back, I suspect she has a mild case of zoster.  Will start Valtrex and monitor.  Pt expressed understanding and is in agreement w/ plan.

## 2024-04-02 ENCOUNTER — Other Ambulatory Visit (HOSPITAL_COMMUNITY): Payer: Self-pay

## 2024-04-03 ENCOUNTER — Other Ambulatory Visit: Payer: Self-pay | Admitting: Family Medicine

## 2024-04-03 ENCOUNTER — Other Ambulatory Visit: Payer: Self-pay

## 2024-04-03 ENCOUNTER — Telehealth: Payer: Self-pay

## 2024-04-03 ENCOUNTER — Other Ambulatory Visit (HOSPITAL_COMMUNITY): Payer: Self-pay

## 2024-04-03 DIAGNOSIS — G43109 Migraine with aura, not intractable, without status migrainosus: Secondary | ICD-10-CM

## 2024-04-03 MED ORDER — IBUPROFEN 800 MG PO TABS
800.0000 mg | ORAL_TABLET | Freq: Three times a day (TID) | ORAL | 1 refills | Status: DC | PRN
  Filled 2024-04-03 (×2): qty 90, 30d supply, fill #0

## 2024-04-03 MED ORDER — VALACYCLOVIR HCL 1 G PO TABS
1000.0000 mg | ORAL_TABLET | Freq: Three times a day (TID) | ORAL | 0 refills | Status: DC
  Filled 2024-04-03 (×2): qty 21, 7d supply, fill #0

## 2024-04-03 NOTE — Telephone Encounter (Signed)
 Copied from CRM [id number]. Topic: Clinical - Prescription Issue >> Apr 03, 2024 11:16 AM Freya Jesus wrote: Reason for CRM: Patient stated that her medication: valACYclovir  (VALTREX ) 1000 MG tablet [[id number]] and ibuprofen  (ADVIL ) 800 MG tablet [[id number]] was sent to wrong pharmacy and should be sent to  [facility] [phone] [address] [place] [address].

## 2024-04-03 NOTE — Telephone Encounter (Signed)
 I have reordered these medications and had them sent to the pharmacy requested below.

## 2024-04-04 ENCOUNTER — Telehealth: Payer: Self-pay

## 2024-04-04 ENCOUNTER — Other Ambulatory Visit: Payer: Self-pay

## 2024-04-04 MED ORDER — LIDOCAINE 5 % EX PTCH
1.0000 | MEDICATED_PATCH | CUTANEOUS | 0 refills | Status: DC
  Filled 2024-04-04 – 2024-04-07 (×2): qty 30, 30d supply, fill #0

## 2024-04-04 NOTE — Telephone Encounter (Signed)
 Copied from CRM [id number]. Topic: Clinical - Medication Question >> Apr 04, 2024  3:15 PM Sophia H wrote: Reason for CRM: patient states she spoke with Dr. Paulla Bossier about possibly having shingles. States she is sure she has it now, wondering if any type of cream can be sent to the pharmacy on her behalf to help with the pain.  Cb # [phone] [facility]

## 2024-04-04 NOTE — Addendum Note (Signed)
 Addended by: Mizani Dilday E on: 04/04/2024 03:56 PM   Modules accepted: Orders

## 2024-04-04 NOTE — Telephone Encounter (Signed)
 Sent in Lidocaine patches for pt to use for pain relief

## 2024-04-04 NOTE — Telephone Encounter (Signed)
 Called patient to let her know this medication has been sent to the pharmacy. Patient verbalized understanding

## 2024-04-05 ENCOUNTER — Other Ambulatory Visit (HOSPITAL_COMMUNITY): Payer: Self-pay

## 2024-04-05 MED ORDER — VALACYCLOVIR HCL 1 G PO TABS
1000.0000 mg | ORAL_TABLET | Freq: Three times a day (TID) | ORAL | 0 refills | Status: DC
  Filled 2024-04-05: qty 21, 7d supply, fill #0

## 2024-04-05 MED ORDER — IBUPROFEN 800 MG PO TABS: 800.0000 mg | ORAL_TABLET | Freq: Three times a day (TID) | ORAL | 1 refills | Status: DC | PRN

## 2024-04-07 ENCOUNTER — Other Ambulatory Visit: Payer: Self-pay

## 2024-04-07 ENCOUNTER — Encounter (HOSPITAL_COMMUNITY): Payer: Self-pay

## 2024-04-07 ENCOUNTER — Other Ambulatory Visit (HOSPITAL_COMMUNITY): Payer: Self-pay

## 2024-04-08 NOTE — Progress Notes (Deleted)
 No chief complaint on file.   HISTORY OF PRESENT ILLNESS:  04/08/24 ALL:  Ann Conley returns for follow up for migraines. She was last seen 12/2023 and reported worsening migraines. We switched prevention to Vyepti but she reported it was too expensive and preferred to stay on Emgality . Nurtec continued for abortive therapy. MRI was ordered for worsening aura and reports of visual hallucinations but...  Since,   12/21/23 ALL: Ann Conley is a 89 y.o. female here today for follow up for worsening migraines and visual hallucinations. She was last seen 09/2023 and reported migraines were well controlled but had been having visual hallucinations for about a year. MRI declined. Ophthalmology exam advised.   Since, she reports worsening auras. She continues to have frequent arm numbness and paresthesias of right side of face. Has head pain associated with aura 1-2 times per month. She is taking Nurtec when migraines and aura occur and it does seem to help but symptoms typically return. She continues to note a shadow in her peripheral vision. Usually at night. No auditory symptoms. Eye exam was normal. Carotid US  last week normal.   She denies acute confusion. Able to drive and manage home/meds independently. No weakness or changes in gait.   09/20/2023 ALL:  Ann Conley returns for follow up for migraines. She was last seen 01/2023 and doing well on Emgality  and Nurtec.   She called 08/2023 reporting PCP advised sooner follow up due to change of location of ice pick pain. She was previously having sharp pains in the top right side of her head. Now describing the same sensation on the right side of her face, below chin and in her neck. Usually at bedtime. Pain last 1-2 seconds. Spontaneously relieved without meds. She tells me, today, that the ice pick pain has improved and she has not had any episodes in over a week.   She continues Emgality  monthly (28th of every month). She reports migraines  occur 2-3 times a month. (Previously reported as 2-3 times a week but she denies this is accurate). Usually not debilitating but she avoids most activities due to vision changes. Headache usually lasts about 2 hours and relieved by rest. Nurtec works well but she can only afford 2 tablets per month. Sample tablets have been provided through the office when available. Ibuprofen  800mg  helps.  She tells me today that she has been having hallucinations for 91m-1 year. She describes seeing a shadow in the distance. Like a fairy moving around. Sometimes these events occur when she is having a headache and other times it doesn't. She reports this is different than her normal migraine aura. She also tells me that she can feel a presence around her like someone or something is in the room. She reports sleeping well most nights. She usually goes to bed around 10-11p and wakes around 7-8a. She naps at least once, maybe twice, during the day. Last eye exam over two years ago. She had cataract surgery about 3 years ago. She drinks at least 60-80 ounces of water daily. BP is usually 150-170/70s. She is followed regularly by PCP.   01/23/2023 ALL: Ann Conley returns for follow up for migraines. She continues Emgality  and Nurtec. She reports doing well, overall. She has noticed an increase in episodes of ice pick pain lasting 2-3 seconds, usually in the evenings around dinner. She also feels that migraine aura of sparling lights may last longer over the past couple of months. She contributes this to more stress. Her  husband fell about a months or so ago ahd had to go to inpatient rehab. He had a difficult time with side effects from opioids and she reports her sleep patterns were disrupted. She reports BP has been slightly higher than normal. Nurtec works very well. She may take 1-2 doses per month. She had a hard time affording copay with Emgality  at the end of last year.   02/21/2022 ALL: Ann Conley returns to discuss some  concerns she has about her migraines. She continues to feel migraines are much better managed on Emgality . She continues Emgality  on the 28th of every month. She reports auras return about a week before next dose is due. Nurtec works well. She has intermittent numbess. Sometimes numbness goes away spontaneously. Nurtec abort aura symptoms. She has some ice pick headaches lasting 1-2 seconds in the evenings. Symptoms resolve spontaneously.   She continues to work closely with her PCP for BP management. She has recently restarted amlodipine . Could not tolerate ACE due to cough. She was seen yesterday and BP was much better (130/60, previously 153/75). Today BP is 163/70. She takes amlodipine  in the evenings. She reports home readings are 130-135 systolic.    01/12/2022 ALL:  Ann Conley is a 89 y.o. female here today for follow up for migraines. She was last seen 07/2021 and advised to continue Nurtec QOD for prevention. She called 07/26/2021 reports cost of Nurtec was unaffordable. We suggested starting Emgality . Sample pens administered 08/02/2021 in the office and she verbalized willingness and comfort with continuing monthly at home. She called 09/01/2021 requesting another sample pen and for our office to help with administration. She came by the office 11/2021 and picked up Nurtec samples instead of Emgality  sample. Conversation with RN 12/05/2021 reports she had returned to taking Nurtec QOD. I recommended she use Emgality  for prevention and Nurtec for abortive therapy due to cost. Sample pen injected in the office 12/06/2021. She was given sample pen to administer with directions printed on packaging. She called reporting intractable migraine 12/26/2021. She reported stopping amlodipine /benazepril  due to cough and interrupted sleep. She had taken tramadol  and ibuprofen  but not used Nurtec. She reported being unaware she could use Nurtec with other medications. She was seen by PCP 12/28/2021 and BP med changed  to amlodipine  10mg  daily.   Today, she reports she has been better since starting the Emgality . She came into the office to receive her Emgality  injection 12/06/21 and was given the pen for her injection that was due on 01/03/22. She will be due for her next injection on 01/31/22. Her insurance authorized the Emgality  in 09/23, but she states she never received a call. I encouraged the patient to reach out to the pharmacy to obtain this medication.   She has lost about 20 pounds in preparation for knee surgery. Labs followed with PCP. LDL 104. On asa 81mg . Patient has been hesitant to start statin. She reports taking amlodipine  as prescribed.   07/14/21 ALL:  Ann Conley returns for follow up for worsening migraines. She was using Nurtec with some success but has not been as effective, recently. She has started taking Nurtec every other day for prevention and felt it was working fairly well until she had a migraine with aura on 8/31. She reports migraine would "come and go" for about 3 days. She did not increase Nurtec but continued every other day dosing. She also stopped propranolol  and valsartan . She is uncertain of when she topped these but thinks it was about 3-4  weeks ago. She was seen by PCP 6/23 and valsartan  was listed as an active medication. She reports that leg swelling and muscle aches have been better since stopping these medications. She has been watching her diet and walking more and has lost 16 pounds.    03/07/2021 ALL: She returns for follow up for complicated migraines. MRI/MRA unremarkable. Nurtec has been helpful for abortive therapy. BP has been much better with Dr Paulla Bossier. Naleyah states it has been elevated some at home. She has had 2-3 headaches over the past month but reports they were not bad and aborted with Nurtec. She has not picked up prescription from pharmacy.    She does report having a fall last week. She was walking through her doorway and her foot got caught on the  entryway. She hit her head up on the wall in front of her. No other injuries. She reports that her forehead has been a little sore but she is doing well, otherwise. No changes in vision. No confusion. No worsening headaches.    01/27/2021 ALL:  She returns today with concerns of left arm numbness and left visual disturbance that started 1 day ago. While playing cards, last night. She felt that her left arm was going to sleep. Numbness seemed to wax and wane and would radiate to the left jaw. She describes a kaleidoscope disturbance of the left eye last night around the same time. Visual disturbance lasted about 10 minutes. Numbness of arm is still present, today. It does not seem to make a difference of what position arm is in. Numbness seems to wax and wane. She is constantly sensitive to light. She wears sun glasses all the time, even at home. She denies head pain. She has taken Excedrin that has not helped.   She denies chest pain, trouble breathing, palpitations, sweating, abdominal or back pain, fever, chills, body aches.    She has been working closely with PCP for uncontrolled HTN. Started on valsartan  last week. She continues propranolol  LA 60mg  daily (for migraines) and amlodipine  10mg  daily. She is unsure if it has made much difference. BP this morning was 200/86. Her head was not hurting when she took her blood pressure. Usual home readings have been around 160/80's.   Triptans have been avoided d/t history of suspected TIA in 2019 with sudden onset of left arm weakness and facial numbness. Symptoms resolved in about an hour. Imaging unremarkable. She also has history of complicated migraine and migraine with aura. She is usually able to abort headache with Excedrin or ibuprofen . She is concerned that arm numbness has lingered for this long as it usually resolves within 12 hours.      12/16/2020 ALL:  She returns for follow up for complicated migraines. At last visit, we encouraged her to  increase propranolol . She reports taking 20mg  BID. She does feel that migraines are not as painful. She continues to have a visual aura with some sort of headache 1-3 times a month. PCP gave her 800mg  ibuprofen  that seems to help. She reports taking 1-2 doses of ibuprofen .    She continues close follow up with PCP for blood pressure management on amlodipine  10mg  daily. She does check on occasion at home but does not remember what readings are. She reports that readings are usually "out of the ballpark". When I ask if today's reading is high for her she reports that it has been this high at home.      09/07/2020 ALL:  Ann Dopp  Conley is a 89 y.o. female here today for follow up for complicated migraines. She has history of partial vision loss with left arm numbness associated with headache. She was last seen 07/14/2020 by Dr Salli Crawley and advised to increase propranolol  to 20mg  BID. She has not increased dose. She has had two migraines since being seen. She was evaluated by the ER on 10/6. CT brain normal. She reports symptoms resolved spontaneously without abortive therapy. She usually takes Excedrin but states that she rarely needs to take it. She reports BP is "always high". Reports it was 144/63 at home yesterday.    REVIEW OF SYSTEMS: Out of a complete 14 system review of symptoms, the patient complains only of the following symptoms, migraines, knee pain and all other reviewed systems are negative.   ALLERGIES: Allergies  Allergen Reactions   Codeine Nausea And Vomiting     HOME MEDICATIONS: Outpatient Medications Prior to Visit  Medication Sig Dispense Refill   amLODipine  (NORVASC ) 10 MG tablet Take 1 tablet (10 mg total) by mouth daily. 90 tablet 3   ibuprofen  (ADVIL ) 800 MG tablet Take 1 tablet (800 mg total) by mouth every 8 (eight) hours as needed for headache. 90 tablet 1   ibuprofen  (ADVIL ) 800 MG tablet Take 1 tablet (800 mg total) by mouth every 8 (eight) hours as needed for  headache. 90 tablet 1   lidocaine  (LIDODERM ) 5 % Place 1 patch onto the skin daily. Remove & Discard patch within 12 hours or as directed by MD 30 patch 0   Magnesium 250 MG TABS Take 250 mg by mouth as needed.     omeprazole  (PRILOSEC) 20 MG capsule Take 1 capsule (20 mg total) by mouth daily. 90 capsule 1   Rimegepant Sulfate  (NURTEC) 75 MG TBDP Take 1 tablet (75 mg total) by mouth daily as needed at start of migraine (no more than 1 tablet in 24 hours or 8 per month). 8 tablet 1   traMADol  (ULTRAM ) 50 MG tablet Take 1 tablet (50 mg total) by mouth every 6 (six) hours as needed. 30 tablet 0   valACYclovir  (VALTREX ) 1000 MG tablet Take 1 tablet (1,000 mg total) by mouth 3 (three) times daily. 21 tablet 0   Vitamin D , Ergocalciferol , (DRISDOL ) 1.25 MG (50000 UNIT) CAPS capsule Take 1 capsule (50,000 Units total) by mouth every 7 (seven) days. 12 capsule 0   No facility-administered medications prior to visit.     PAST MEDICAL HISTORY: Past Medical History:  Diagnosis Date   Anemia    Blood transfusion complicating pregnancy 1961   GERD (gastroesophageal reflux disease)    History of chicken pox    Hypertension    Migraine    occular   TIA (transient ischemic attack)      PAST SURGICAL HISTORY: Past Surgical History:  Procedure Laterality Date   BREAST BIOPSY     CHOLECYSTECTOMY     PARTIAL HYSTERECTOMY       FAMILY HISTORY: Family History  Problem Relation Age of Onset   Breast cancer Mother    Heart disease Father    Heart disease Sister        pacemaker   Diabetes Sister      SOCIAL HISTORY: Social History   Socioeconomic History   Marital status: Married    Spouse name: Not on file   Number of children: 5   Years of education: Not on file   Highest education level: Bachelor's degree (e.g., BA, AB, BS)  Occupational  History   Not on file  Tobacco Use   Smoking status: Never   Smokeless tobacco: Never  Vaping Use   Vaping status: Never Used  Substance  and Sexual Activity   Alcohol use: Yes    Comment: rare   Drug use: No   Sexual activity: Not on file  Other Topics Concern   Not on file  Social History Narrative   Lives with husband.  5 children.  Education BA.   Right handed   Caffeine use: 1 cup coffee every morning   Social Drivers of Health   Financial Resource Strain: Low Risk  (01/08/2024)   Overall Financial Resource Strain (CARDIA)    Difficulty of Paying Living Expenses: Not hard at all  Food Insecurity: No Food Insecurity (01/08/2024)   Hunger Vital Sign    Worried About Running Out of Food in the Last Year: Never true    Ran Out of Food in the Last Year: Never true  Transportation Needs: No Transportation Needs (01/08/2024)   PRAPARE - Administrator, Civil Service (Medical): No    Lack of Transportation (Non-Medical): No  Physical Activity: Inactive (01/08/2024)   Exercise Vital Sign    Days of Exercise per Week: 0 days    Minutes of Exercise per Session: 0 min  Stress: No Stress Concern Present (01/08/2024)   [employer] of Occupational Health - Occupational Stress Questionnaire    Feeling of Stress : Not at all  Social Connections: Socially Integrated (01/08/2024)   Social Connection and Isolation Panel [NHANES]    Frequency of Communication with Friends and Family: More than three times a week    Frequency of Social Gatherings with Friends and Family: Three times a week    Attends Religious Services: More than 4 times per year    Active Member of Clubs or Organizations: Yes    Attends Banker Meetings: More than 4 times per year    Marital Status: Married  Catering manager Violence: Not At Risk (01/08/2024)   Humiliation, Afraid, Rape, and Kick questionnaire    Fear of Current or Ex-Partner: No    Emotionally Abused: No    Physically Abused: No    Sexually Abused: No     PHYSICAL EXAM  There were no vitals filed for this visit.     There is no height or weight on file to  calculate BMI.  Generalized: Well developed, in no acute distress  Cardiology: normal rate and rhythm, no murmur auscultated  Respiratory: clear to auscultation bilaterally    Neurological examination  Mentation: Alert oriented to time, place, history taking. Follows all commands speech and language fluent Cranial nerve II-XII: Pupils were equal round reactive to light. Right iris with darker coloring at 6 o'clock (baseline). Extraocular movements were full, visual field were full on confrontational test. Facial sensation and strength were normal. Head turning and shoulder shrug  were normal and symmetric. Motor: The motor testing reveals 5 over 5 strength of all 4 extremities. Good symmetric motor tone is noted throughout.  Gait and station: Gait is arthritic, stable with cane     DIAGNOSTIC DATA (LABS, IMAGING, TESTING) - I reviewed patient records, labs, notes, testing and imaging myself where available.  Lab Results  Component Value Date   WBC 6.8 02/14/2024   HGB 13.4 02/14/2024   HCT 40.7 02/14/2024   MCV 85.9 02/14/2024   PLT 254.0 02/14/2024      Component Value Date/Time   NA 137  02/14/2024 1349   K 4.2 02/14/2024 1349   CL 100 02/14/2024 1349   CO2 29 02/14/2024 1349   GLUCOSE 100 (H) 02/14/2024 1349   BUN 22 02/14/2024 1349   CREATININE 0.63 02/14/2024 1349   CREATININE 0.56 (L) 02/09/2023 1504   CALCIUM 9.4 02/14/2024 1349   PROT 7.0 02/14/2024 1349   ALBUMIN 4.5 02/14/2024 1349   AST 13 02/14/2024 1349   ALT 12 02/14/2024 1349   ALKPHOS 111 02/14/2024 1349   BILITOT 0.4 02/14/2024 1349   GFRNONAA >60 01/27/2021 1757   GFRAA >60 08/10/2018 1330   Lab Results  Component Value Date   CHOL 201 (H) 08/17/2023   HDL 75.60 08/17/2023   LDLCALC 94 08/17/2023   LDLDIRECT 125.0 09/09/2012   TRIG 158.0 (H) 08/17/2023   CHOLHDL 3 08/17/2023   Lab Results  Component Value Date   HGBA1C 5.7 09/12/2019   Lab Results  Component Value Date   VITAMINB12 591  02/14/2024   Lab Results  Component Value Date   TSH 3.04 02/14/2024       09/20/2017   11:05 AM  MMSE - Mini Mental State Exam  Orientation to time 5  Orientation to Place 5  Registration 3  Attention/ Calculation 5  Recall 2  Language- name 2 objects 2  Language- repeat 1  Language- follow 3 step command 3  Language- read & follow direction 1  Write a sentence 1  Copy design 1  Total score 29         No data to display           ASSESSMENT AND PLAN  89 y.o. year old female  has a past medical history of Anemia, Blood transfusion complicating pregnancy (1961), GERD (gastroesophageal reflux disease), History of chicken pox, Hypertension, Migraine, and TIA (transient ischemic attack). here with    No diagnosis found.  Cambrea reports migraines are well managed on Emgality . We have discussed more frequent ice pick pain and change of location as well as concerns of visula hallucinations for the past year. MRI offered but she wishes to pursue eye exam first. Neuro exam intact. She will continue Emgality , 1 injection on the 28th of every month. She may continue Nurtec as needed. I have provider her with two Emgality  sample pens and 8 Nurtec tablets. Will consider Vyepti infusions in the future, if needed. She will monitor closely and let me know if symptoms worsen. She was encouraged to continue amlodipine  and close follow up with PCP for BP management. She may return in 1 year, sooner if needed.    No orders of the defined types were placed in this encounter.    No orders of the defined types were placed in this encounter.     Terrilyn Fick, MSN, FNP-C 04/08/2024, 4:26 PM  [facility] [address] [place], [place] [address] [phone]

## 2024-04-08 NOTE — Patient Instructions (Incomplete)

## 2024-04-09 ENCOUNTER — Ambulatory Visit: Payer: Medicare Other | Admitting: Family Medicine

## 2024-04-09 ENCOUNTER — Encounter: Payer: Self-pay | Admitting: Family Medicine

## 2024-04-09 DIAGNOSIS — G43109 Migraine with aura, not intractable, without status migrainosus: Secondary | ICD-10-CM

## 2024-04-11 ENCOUNTER — Telehealth: Payer: Self-pay

## 2024-04-11 ENCOUNTER — Other Ambulatory Visit (HOSPITAL_COMMUNITY): Payer: Self-pay

## 2024-04-11 ENCOUNTER — Other Ambulatory Visit (HOSPITAL_BASED_OUTPATIENT_CLINIC_OR_DEPARTMENT_OTHER): Payer: Self-pay

## 2024-04-11 NOTE — Telephone Encounter (Signed)
 Pharmacy Patient Advocate Encounter   Received notification from CoverMyMeds that prior authorization for Lidocaine  5% patches  is required/requested.   Insurance verification completed.   The patient is insured through [employer] .   Per test claim: PA required; PA submitted to above mentioned insurance via CoverMyMeds Key/confirmation #/EOC [id number] Status is pending

## 2024-04-11 NOTE — Telephone Encounter (Signed)
 Pharmacy Patient Advocate Encounter  Received notification from [facility] medicare that Prior Authorization for Lidocaine  5% patches  has been DENIED.  See denial reason below. No denial letter attached in CMM. Will attach denial letter to Media tab once received.   PA #/Case ID/Reference #: [id number]

## 2024-04-14 NOTE — Telephone Encounter (Signed)
 Since insurance won't cover a prescription strength lidocaine  patch, she can use OTC lidocaine  patches as needed for pain

## 2024-04-14 NOTE — Telephone Encounter (Signed)
 Left vm for pt to call office

## 2024-04-14 NOTE — Telephone Encounter (Signed)
 Please advise on denial.

## 2024-04-29 ENCOUNTER — Ambulatory Visit: Payer: Self-pay

## 2024-04-29 NOTE — Telephone Encounter (Signed)
 FYI

## 2024-04-29 NOTE — Telephone Encounter (Signed)
 FYI Only or Action Required?: FYI only for provider.  Patient was last seen in primary care on 04/01/2024 by Mahlon Comer BRAVO, MD. Called Nurse Triage reporting foot pan. Symptoms began about a month ago. Interventions attempted: Rest, hydration, or home remedies. Symptoms are: unchanged.  Triage Disposition: See PCP When Office is Open (Within 3 Days)  Patient/caregiver understands and will follow disposition?: Yes   Reason for Disposition  [1] MODERATE pain (e.g., interferes with normal activities, limping) AND [2] present > 3 days  Answer Assessment - Initial Assessment Questions 1. ONSET: When did the pain start?      1 month  2. LOCATION: Where is the pain located?      Left foot 3. PAIN: How bad is the pain?    (Scale 1-10; or mild, moderate, severe)  - MILD (1-3): doesn't interfere with normal activities.   - MODERATE (4-7): interferes with normal activities (e.g., work or school) or awakens from sleep, limping.   - SEVERE (8-10): excruciating pain, unable to do any normal activities, unable to walk.      Moderate to severe when present  4. WORK OR EXERCISE: Has there been any recent work or exercise that involved this part of the body?      No 5. CAUSE: What do you think is causing the foot pain?     Unsure  6. OTHER SYMPTOMS: Do you have any other symptoms? (e.g., leg pain, rash, fever, numbness)     No  Protocols used: Foot Pain-A-AH

## 2024-04-30 ENCOUNTER — Ambulatory Visit (INDEPENDENT_AMBULATORY_CARE_PROVIDER_SITE_OTHER): Admitting: Family Medicine

## 2024-04-30 ENCOUNTER — Encounter: Payer: Self-pay | Admitting: Family Medicine

## 2024-04-30 VITALS — BP 136/72 | HR 82 | Temp 97.8°F | Ht 59.0 in | Wt 236.8 lb

## 2024-04-30 DIAGNOSIS — M79672 Pain in left foot: Secondary | ICD-10-CM

## 2024-04-30 NOTE — Patient Instructions (Signed)
 Follow up as needed or as scheduled GO TO [address] and get your foot xray (they are open today, closed tomorrow and Friday, and open again on Monday) ICE the top of your foot when it hurts Wear good supportive shoes Call with any questions or concerns Hang in there!

## 2024-04-30 NOTE — Progress Notes (Signed)
   Subjective:    Patient ID: Ann Conley, female    DOB: Aug 28, 1934, 89 y.o.   MRN: [id number]  HPI Foot pain- L foot pain.  Will come and go.  'it feels like it's broken sometimes'.  Pain is on top of foot.  At times has difficulty walking on foot.  Asking for an xray.  Ann Conley fell 2 months ago.  No bruising, redness, warmth.   Review of Systems For ROS see HPI     Objective:   Physical Exam Vitals reviewed.  Constitutional:      General: Ann Conley is not in acute distress.    Appearance: Normal appearance. Ann Conley is not ill-appearing.  HENT:     Head: Normocephalic and atraumatic.   Cardiovascular:     Pulses: Normal pulses.   Musculoskeletal:        General: No swelling, tenderness (no TTP over L foot) or deformity.     Right lower leg: No edema.     Left lower leg: No edema.   Skin:    General: Skin is warm and dry.     Findings: No bruising.   Neurological:     General: No focal deficit present.     Mental Status: Ann Conley is alert and oriented to person, place, and time.           Assessment & Plan:  L foot pain- new.  Pt reports sxs started after her fall.  Pain is episodic.  States at times it 'feels like it's broken'.  No swelling, no bruising.  No TTP on exam today.  Given her pain and her request for xrays, these were ordered.  Encouraged her to ice as needed and wear supportive shoes.  Pt expressed understanding and is in agreement w/ plan.

## 2024-05-16 ENCOUNTER — Telehealth (INDEPENDENT_AMBULATORY_CARE_PROVIDER_SITE_OTHER): Admitting: Student in an Organized Health Care Education/Training Program

## 2024-05-16 ENCOUNTER — Encounter: Payer: Self-pay | Admitting: Student in an Organized Health Care Education/Training Program

## 2024-05-16 ENCOUNTER — Telehealth: Payer: Self-pay

## 2024-05-16 VITALS — Temp 97.7°F | Ht 59.0 in | Wt 236.0 lb

## 2024-05-16 DIAGNOSIS — B029 Zoster without complications: Secondary | ICD-10-CM | POA: Insufficient documentation

## 2024-05-16 NOTE — Telephone Encounter (Signed)
 Copied from CRM [id number]. Topic: Clinical - Medical Advice >> May 16, 2024 10:56 AM Chasity T wrote: Reason for CRM: Patient is calling in because her shingles is getting worse and wants to know if she can get the prescription she was on again today. Please give her a call to discuss

## 2024-05-16 NOTE — Telephone Encounter (Signed)
 Ann Conley scheduled for today

## 2024-05-16 NOTE — Progress Notes (Signed)
   Acute Video Visit  I connected with  [name] on 05/16/24 by a video enabled telemedicine application and verified that I am speaking with the correct person using two identifiers.   I discussed the limitations of evaluation and management by telemedicine. The patient expressed understanding and agreed to proceed.   Subjective:     Patient ID: Ann Conley, female    DOB: 05-20-1934, 89 y.o.   MRN: [id number]  Chief Complaint  Patient presents with   Herpes Conley    Breakout coming back x3wks; and wanted to see if can be prescribed same medication as before     HPI  89 year old person here for a video visit because she is not feeling well at home, has a concern about possible recurring shingles.  She was treated on May 27 for shingles causing a rash along Ann left lower back.  She received a course of valacyclovir .  She reports doing bit well after treatments.  Pain resolved.  The rash improved.  Over the last few days she has noticed some new bumps along Ann back, these are not painful.  Also noticed a new bump on Ann right lower leg.  She was worried the shingles was coming back.  Ann husband says that some of the spots on Ann lower back are now gone and they had trouble finding some of the spots.  No fevers or chills.  She has a family reunion at the end of next week and she wants to do everything she can to be there.  Good adherence with Ann other medications.  No recent falls.  Eating and drinking well.      Objective:    Temp 97.7 F (36.5 C) Comment: per pt  Ht 4' 11 (1.499 m)   Wt 236 lb (107 kg)   BMI 47.67 kg/m    Physical Exam  Gen: Well-appearing woman Skin: Ann left lower back has a few small red macules, look like postinflammatory reactions.  I do not see any active vesicles.  There is no active crusting.  No cellulitis.  On Ann right anterior leg there is another red patch, no vesicles, no purulence.  Also looks like a postinflammatory  change.      Assessment & Plan:   Problem List Items Addressed This Visit       Unprioritized   Shingles - Primary   Seems to be resolving.  Patient was concerned about some new red spots on Ann left lower back and on the anterior right leg.  On the camera view, these look like red macules, but they do not look like vesicles.  No dermatomal distribution, no discomfort.  I do not think these are new areas of Conley infection.  I did not recommend retreating with antivirals.  I recommended if the rash worsens over the coming days to come into the office for an in person visit so we can look at the rash more carefully.  But overall she seems to have recovered nicely from the shingles infection that occurred about 6 weeks ago.        Cleatus Debby Specking, MD

## 2024-05-16 NOTE — Assessment & Plan Note (Signed)
 Seems to be resolving.  Patient was concerned about some new red spots on her left lower back and on the anterior right leg.  On the camera view, these look like red macules, but they do not look like vesicles.  No dermatomal distribution, no discomfort.  I do not think these are new areas of zoster infection.  I did not recommend retreating with antivirals.  I recommended if the rash worsens over the coming days to come into the office for an in person visit so we can look at the rash more carefully.  But overall she seems to have recovered nicely from the shingles infection that occurred about 6 weeks ago.

## 2024-05-19 ENCOUNTER — Other Ambulatory Visit (HOSPITAL_COMMUNITY): Payer: Self-pay

## 2024-05-19 ENCOUNTER — Encounter: Payer: Self-pay | Admitting: Student in an Organized Health Care Education/Training Program

## 2024-05-19 ENCOUNTER — Ambulatory Visit (INDEPENDENT_AMBULATORY_CARE_PROVIDER_SITE_OTHER): Admitting: Student in an Organized Health Care Education/Training Program

## 2024-05-19 VITALS — BP 149/84 | HR 84 | Wt 236.0 lb

## 2024-05-19 DIAGNOSIS — L0390 Cellulitis, unspecified: Secondary | ICD-10-CM | POA: Insufficient documentation

## 2024-05-19 DIAGNOSIS — L03115 Cellulitis of right lower limb: Secondary | ICD-10-CM

## 2024-05-19 MED ORDER — CEPHALEXIN 500 MG PO CAPS
500.0000 mg | ORAL_CAPSULE | Freq: Two times a day (BID) | ORAL | 0 refills | Status: AC
  Filled 2024-05-19 (×2): qty 14, 7d supply, fill #0

## 2024-05-19 NOTE — Progress Notes (Signed)
 Acute Office Visit  Subjective:     Patient ID: Ann Conley, female    DOB: 1933/12/10, 89 y.o.   MRN: [id number]  Chief Complaint  Patient presents with   Medical Management of Chronic Issues    Follow up with rash last seen on 05/16/2024.     HPI  Discussed the use of AI scribe software for clinical note transcription with the patient, who gave verbal consent to proceed.  History of Present Illness Ann Conley is an 89 year old female who presents with a rash and swelling on her right leg. She is accompanied by her father.  She has a rash on the front of her right leg that appeared on Friday and has since increased in size. She reports swelling associated with the rash, which she has not noticed before. She has a history of cellulitis and has experienced similar symptoms in the past.  She feels exhausted and not herself, taking about three naps a day and having difficulty sleeping through the night. This fatigue has been ongoing for a while, and she associates it with a previous shingles diagnosis. No fever, breathing problems, chest pain, or lightheadedness.  She has a history of migraines with auras, and she has been experiencing these more frequently. She also mentions a change in the color of her right eye following a fall, although her vision remains unaffected.  She has been experiencing pain in her left foot, which worsens with twisting, and she has been hobbling due to this discomfort. She recalls a fall a few months ago, which may have contributed to her current symptoms.  No new bleeding, changes in appetite, or significant weight loss. She is on minimal medications and has no known drug allergies. 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       Objective:    BP (!) 149/84   Pulse 84   Wt 236 lb (107 kg)   SpO2 98%   BMI 47.67 kg/m   Physical Exam  Gen: Well-appearing woman Heart: Regular, 2 out of 6 early stock murmur at the left upper sternal  border Lungs: Unlabored, clear Ext: Warm, 1+ pitting edema in both lower extremities Skin: On the right lower extremity there is a 5 cm erythematous patch that is well-demarcated and tender to touch.  No underlying fluctuance.  No vesicles.  Rest of her skin is normal-appearing.        Assessment & Plan:    Problem List Items Addressed This Visit       Unprioritized   Cellulitis - Primary   Localized infection on right leg, likely exacerbated by chronic swelling. No vesicles present, ruling out shingles. Fatigue possibly related to infection. - Prescribe Keflex  500 mg, one tablet twice a day for 7 days. - Recommend wearing compression socks during the day to reduce swelling. - Order blood work to check for anemia, kidney function, and electrolytes given her significant systemic fatigue. - Advise follow-up if symptoms do not improve or worsen.      Relevant Medications   cephALEXin  (KEFLEX ) 500 MG capsule   Other Relevant Orders   Basic metabolic panel with GFR   CBC with Differential/Platelet    Meds ordered this encounter  Medications   cephALEXin  (KEFLEX ) 500 MG capsule    Sig: Take 1 capsule (500 mg total) by mouth 2 (two) times daily for 7 days.    Dispense:  14 capsule    Refill:  0    Return  if symptoms worsen or fail to improve.  Cleatus Debby Specking, MD

## 2024-05-19 NOTE — Assessment & Plan Note (Signed)
 Localized infection on right leg, likely exacerbated by chronic swelling. No vesicles present, ruling out shingles. Fatigue possibly related to infection. - Prescribe Keflex  500 mg, one tablet twice a day for 7 days. - Recommend wearing compression socks during the day to reduce swelling. - Order blood work to check for anemia, kidney function, and electrolytes given her significant systemic fatigue. - Advise follow-up if symptoms do not improve or worsen.

## 2024-05-19 NOTE — Patient Instructions (Signed)
  VISIT SUMMARY: During your visit, we discussed the rash and swelling on your right leg, your ongoing fatigue, and the increased frequency of your migraines. We also reviewed your history of cellulitis and previous shingles diagnosis. Additionally, we addressed the pain in your left foot and the chronic swelling in your lower extremities.  YOUR PLAN: -CELLULITIS: Cellulitis is a bacterial skin infection that causes redness, swelling, and pain. We have prescribed Keflex  500 mg, one tablet twice a day for 7 days, to treat the infection. You should also wear compression socks during the day to help reduce the swelling. We have ordered blood work to check for anemia, kidney function, and electrolytes. Please follow up if your symptoms do not improve or worsen.  -CHRONIC SWELLING OF LOWER EXTREMITIES: Chronic swelling in the legs can be due to various reasons, including poor circulation. We recommend wearing compression socks during the day to help manage the swelling.  -FATIGUE: Fatigue can be caused by many factors, including infections and stress. We have ordered blood work to check for anemia, kidney function, and electrolytes to identify any underlying issues.  -HERPES ZOSTER: Herpes Zoster, also known as shingles, is a viral infection that causes a painful rash. Your current leg rash is not consistent with shingles.  -MIGRAINE WITH AURA: Migraines with aura are severe headaches that can be accompanied by visual disturbances. We noted an increase in the frequency of your migraines and will continue to monitor this.  INSTRUCTIONS: Please follow up if your symptoms do not improve or worsen. We have ordered blood work to check for anemia, kidney function, and electrolytes. Make sure to wear compression socks during the day to help with the swelling in your legs.

## 2024-05-20 ENCOUNTER — Other Ambulatory Visit (INDEPENDENT_AMBULATORY_CARE_PROVIDER_SITE_OTHER)

## 2024-05-20 DIAGNOSIS — L03115 Cellulitis of right lower limb: Secondary | ICD-10-CM | POA: Diagnosis not present

## 2024-05-20 LAB — BASIC METABOLIC PANEL WITH GFR
BUN: 19 mg/dL (ref 6–23)
CO2: 31 meq/L (ref 19–32)
Calcium: 9.7 mg/dL (ref 8.4–10.5)
Chloride: 99 meq/L (ref 96–112)
Creatinine, Ser: 0.64 mg/dL (ref 0.40–1.20)
GFR: 79.36 mL/min (ref >60.00)
Glucose, Bld: 111 mg/dL — ABNORMAL HIGH (ref 70–99)
Potassium: 4.1 meq/L (ref 3.5–5.1)
Sodium: 136 meq/L (ref 135–145)

## 2024-05-20 LAB — CBC WITH DIFFERENTIAL/PLATELET
Basophils Absolute: 0.1 K/uL (ref 0.0–0.1)
Basophils Relative: 0.9 % (ref 0.0–3.0)
Eosinophils Absolute: 0.1 K/uL (ref 0.0–0.7)
Eosinophils Relative: 1.7 % (ref 0.0–5.0)
HCT: 38 % (ref 36.0–46.0)
Hemoglobin: 12.4 g/dL (ref 12.0–15.0)
Lymphocytes Relative: 19.2 % (ref 12.0–46.0)
Lymphs Abs: 1.4 K/uL (ref 0.7–4.0)
MCHC: 32.6 g/dL (ref 30.0–36.0)
MCV: 85 fl (ref 78.0–100.0)
Monocytes Absolute: 0.5 K/uL (ref 0.1–1.0)
Monocytes Relative: 6.4 % (ref 3.0–12.0)
Neutro Abs: 5.3 K/uL (ref 1.4–7.7)
Neutrophils Relative %: 71.8 % (ref 43.0–77.0)
Platelets: 263 K/uL (ref 150.0–400.0)
RBC: 4.46 Mil/uL (ref 3.87–5.11)
RDW: 16.2 % — ABNORMAL HIGH (ref 11.5–15.5)
WBC: 7.3 K/uL (ref 4.0–10.5)

## 2024-05-21 ENCOUNTER — Ambulatory Visit (HOSPITAL_BASED_OUTPATIENT_CLINIC_OR_DEPARTMENT_OTHER)
Admission: RE | Admit: 2024-05-21 | Discharge: 2024-05-21 | Disposition: A | Discharge status: Home / Self Care | Source: Ambulatory Visit | Attending: Family Medicine | Admitting: Family Medicine

## 2024-05-21 ENCOUNTER — Ambulatory Visit: Payer: Self-pay | Admitting: Student in an Organized Health Care Education/Training Program

## 2024-05-21 DIAGNOSIS — M79672 Pain in left foot: Secondary | ICD-10-CM | POA: Insufficient documentation

## 2024-05-21 DIAGNOSIS — M7989 Other specified soft tissue disorders: Secondary | ICD-10-CM | POA: Diagnosis not present

## 2024-05-21 DIAGNOSIS — M7732 Calcaneal spur, left foot: Secondary | ICD-10-CM | POA: Diagnosis not present

## 2024-05-21 DIAGNOSIS — M19072 Primary osteoarthritis, left ankle and foot: Secondary | ICD-10-CM | POA: Diagnosis not present

## 2024-05-26 ENCOUNTER — Ambulatory Visit: Payer: Self-pay

## 2024-05-26 ENCOUNTER — Telehealth: Payer: Self-pay

## 2024-05-26 NOTE — Telephone Encounter (Signed)
 Copied from CRM [id number]. Topic: Clinical - Lab/Test Results >> May 26, 2024  1:38 PM Rosina BIRCH wrote: Reason for CRM: patient called wanting to know if we received the chest xray results back that she did [phone]

## 2024-05-26 NOTE — Telephone Encounter (Signed)
 Called patient and she states she is having a lot of pain in her left foot today. Called patient to clarify which X-ray she was looking for results from. Patient stated she was looking for the results from her left foot xray. I explained to patient I will call radiology reading room and will reach back out once these results come in.

## 2024-05-26 NOTE — Telephone Encounter (Signed)
 FYI Only or Action Required?: FYI only for provider.  Patient was last seen in primary care on 05/19/2024 by Jerrell Cleatus Ned, MD.  Called Nurse Triage reporting Results.  Symptoms began 2 months ago.  Symptoms are: unchanged.  Triage Disposition: Call PCP Within 24 Hours  Patient/caregiver understands and will follow disposition?: Yes    Copied from CRM [id number]. Topic: Clinical - Red Word Triage >> May 26, 2024  1:39 PM Ann Conley wrote: Red Word that prompted transfer to Nurse Triage: patient is having pain when she walks and moves her left foot    Reason for Disposition  Caller requesting lab results  (Exception: Routine or non-urgent lab result.)    X-ray results  Answer Assessment - Initial Assessment Questions Patient calling in for x-ray results. I have advised the patient that we do not have results back yet and that someone would contact her with the results when we have them. Patient verbalized understanding and agreement with this plan.    1. REASON FOR CALL or QUESTION: What is your reason for calling today? or How can I best     X-ray results  2. CALLER: Document the source of call. (e.g., laboratory staff, caregiver or patient).     Patient  Protocols used: PCP Call - No Triage-A-AH

## 2024-05-26 NOTE — Telephone Encounter (Signed)
 Sending as an Financial planner. Patient is waiting for xray results.

## 2024-05-27 ENCOUNTER — Encounter: Payer: Self-pay | Admitting: Student in an Organized Health Care Education/Training Program

## 2024-05-27 ENCOUNTER — Ambulatory Visit: Payer: Self-pay

## 2024-05-27 ENCOUNTER — Ambulatory Visit: Admitting: Student in an Organized Health Care Education/Training Program

## 2024-05-27 VITALS — BP 152/54 | HR 81 | Temp 98.1°F | Ht 59.0 in | Wt 241.4 lb

## 2024-05-27 DIAGNOSIS — L03115 Cellulitis of right lower limb: Secondary | ICD-10-CM | POA: Diagnosis not present

## 2024-05-27 DIAGNOSIS — R600 Localized edema: Secondary | ICD-10-CM

## 2024-05-27 NOTE — Assessment & Plan Note (Signed)
 Discomfort in both legs making it difficult for her to ambulate at times.  X-ray confirmed arthritis with swelling, no fracture.  Chronic lower extremity edema most likely multifactorial from obesity and venous insufficiency.  No signs of heart failure on exam, normal echocardiogram in 2019.  Recommended compression stockings for swelling management. Encourage walking to reduce swelling and improve mobility.  I offered the patient physical therapy to help with deconditioning and improve ambulation, she declines for now.

## 2024-05-27 NOTE — Telephone Encounter (Signed)
 FYI Only or Action Required?: FYI only for provider.  Patient was last seen in primary care on 05/19/2024 by Jerrell Cleatus Ned, MD.  Called Nurse Triage reporting Cellulitis.  Symptoms began several weeks ago.  Interventions attempted: Prescription medications: Keflex .  Symptoms are: unchanged.  Triage Disposition: See Physician Within 24 Hours  Patient/caregiver understands and will follow disposition?: Yes   Copied from CRM [id number]. Topic: Clinical - Red Word Triage >> May 27, 2024  8:26 AM Deaijah H wrote: Red Word that prompted transfer to Nurse Triage: Cellulitis hasn't gone away / left foot that's very painful & really swollen Reason for Disposition  [1] Taking antibiotic > 24 hours AND [2] cellulitis symptoms are WORSE (e.g., spreading redness, pain, swelling)  Answer Assessment - Initial Assessment Questions 1. INFECTION: What infection is the antibiotic being given for?     Cellulitis  2. ANTIBIOTIC: What antibiotic are you taking How many times per day?     Keflex   3. DURATION: When was the antibiotic started?     7/14  4. MAIN CONCERN OR SYMPTOM:  What is your main concern right now?     Not improving, still red and painful  5. BETTER-SAME-WORSE: Are you getting better, staying the same, or getting worse compared to when you first started the antibiotics? If getting worse, ask: In what way?      Same, no improving  6. FEVER: Do you have a fever? If Yes, ask: What is your temperature, how was it measured, and when did it start?     No  7. SYMPTOMS: Are there any other symptoms you're concerned about? If Yes, ask: When did it start?     Swelling and tightness in foot  8. FOLLOW-UP APPOINTMENT: Do you have a follow-up appointment with your doctor?     *No Answer*  Protocols used: Infection on Antibiotic Follow-up Call-A-[facility], Cellulitis on Antibiotic Follow-up Call-A-AH

## 2024-05-27 NOTE — Telephone Encounter (Signed)
 Patient has an appointment with you today.

## 2024-05-27 NOTE — Progress Notes (Signed)
   Established Patient Office Visit  Subjective   Patient ID: Ann Conley, female    DOB: 21-Feb-1934  Age: 89 y.o. MRN: [id number]  Chief Complaint  Patient presents with   Cellulitis    Right shin. States tingling at end of leg. States she had shingles about a month ago and that's when the tingling has started. Patient wondering about her x-ray.     HPI  Discussed the use of AI scribe software for clinical note transcription with the patient, who gave verbal consent to proceed.  History of Present Illness Ann Conley is an 89 year old female who presents with swelling and pain in the left foot.  She has been experiencing extreme pain in her left foot for the past two to three months, describing it as feeling 'like it's broken.' The pain is located on the top of the foot, underneath the skin, and feels as though it is in the bones. Swelling is present in both feet, with the left foot being more affected.  She has a history of cellulitis and is concerned about the risk of recurrence due to the swelling in her legs. She recalls a past incident where she had a red streak running down her leg, which was concerning for cellulitis.  She experiences fatigue, which she attributes to both the pain in her foot and general tiredness. She describes being 'so tired' after performing household tasks such as preparing breakfast. Occasional tingling sensations are noted, which she associates with a past episode of shingles.  She has not had any heart problems in the past and reports that her heart 'sounds good.' An echocardiogram in 2019 was normal.  She is currently living at home with her husband and is in the process of arranging for additional help at home. Her children provide cleaning services a couple of times a month, and she is looking for someone to assist a few times a week.     Objective:     BP (!) 152/54   Pulse 81   Temp 98.1 F (36.7 C) (Oral)   Ht 4' 11 (1.499 m)    Wt 241 lb 6.4 oz (109.5 kg)   SpO2 94%   BMI 48.76 kg/m    Physical Exam  Gen: Frail-appearing older woman Heart: Regular, no murmur Lungs: Mildly tachypneic while talking, no crackles, occasional end expiratory wheezing but not consistent Ext: Warm, 1+ chronic pitting edema in both lower extremities, Skin: Erythema on the anterior shin of the right lower extremity is much improved compared to last week.      Assessment & Plan:   Problem List Items Addressed This Visit       Unprioritized   Cellulitis - Primary   Cellulitis in the leg improved, redness decreased, no systemic involvement.  Finished with antibiotics.      Bilateral leg edema   Discomfort in both legs making it difficult for her to ambulate at times.  X-ray confirmed arthritis with swelling, no fracture.  Chronic lower extremity edema most likely multifactorial from obesity and venous insufficiency.  No signs of heart failure on exam, normal echocardiogram in 2019.  Recommended compression stockings for swelling management. Encourage walking to reduce swelling and improve mobility.  I offered the patient physical therapy to help with deconditioning and improve ambulation, she declines for now.       Return in about 4 weeks (around 06/24/2024).    Cleatus Debby Specking, MD

## 2024-05-27 NOTE — Patient Instructions (Signed)
  VISIT SUMMARY: During your visit, we discussed the swelling and pain in your left foot, which has been troubling you for the past few months. We also reviewed your history of cellulitis, fatigue, and occasional tingling sensations. Your recent blood work was normal, and we talked about ways to manage your symptoms and improve your overall health.  YOUR PLAN: -ARTHRITIS OF THE LEFT FOOT: Arthritis is a condition that causes inflammation and pain in the joints. Your X-ray confirmed arthritis in your left foot, which is causing severe pain and swelling. To manage the swelling, I recommend wearing compression stockings. Walking regularly can help reduce the swelling and improve your mobility. If you are open to it, we can also consider referring you to physical therapy.  -CELLULITIS: Cellulitis is a bacterial skin infection that can cause redness, swelling, and pain. Your cellulitis has improved, and the redness has decreased. There are no signs of a more serious infection.  -FATIGUE: Fatigue is a feeling of extreme tiredness. Your fatigue is likely due to a combination of factors including age, caregiving responsibilities, lack of physical conditioning, pain, and swelling. Your blood work was normal, which is reassuring.  -POSTHERPETIC NEURALGIA: Postherpetic neuralgia is nerve pain that occurs after a shingles infection. The occasional tingling you feel is consistent with this condition.  -GENERAL HEALTH MAINTENANCE: Your mobility and weight management challenges make it difficult to exercise. You have joined the [employer] and are waiting for more information. I encourage you to participate in this program once it becomes available. Additionally, consider exploring more home support options to help with daily activities.  INSTRUCTIONS: Please follow up with us  if your symptoms worsen or if you have any new concerns. Continue wearing compression stockings and try to walk regularly to  manage the swelling in your feet. Once you receive information about the [facility] exercise program, I encourage you to participate. If you are interested in physical therapy, let us  know so we can provide a referral.

## 2024-05-27 NOTE — Assessment & Plan Note (Signed)
 Cellulitis in the leg improved, redness decreased, no systemic involvement.  Finished with antibiotics.

## 2024-06-05 ENCOUNTER — Other Ambulatory Visit: Payer: Self-pay | Admitting: Family Medicine

## 2024-06-05 ENCOUNTER — Other Ambulatory Visit (HOSPITAL_COMMUNITY): Payer: Self-pay

## 2024-06-05 NOTE — Telephone Encounter (Signed)
 Medication was d/c 05/19/24. Patient is requesting refill. Please advise.  Requested Prescriptions   Pending Prescriptions Disp Refills   traMADol  (ULTRAM ) 50 MG tablet 30 tablet 0    Sig: Take 1 tablet (50 mg total) by mouth every 6 (six) hours as needed.     Date of patient request: 06/05/24 Last office visit: 04/30/2024 Upcoming visit: 06/27/2024

## 2024-06-19 ENCOUNTER — Other Ambulatory Visit (HOSPITAL_COMMUNITY): Payer: Self-pay

## 2024-06-23 ENCOUNTER — Other Ambulatory Visit (HOSPITAL_COMMUNITY): Payer: Self-pay

## 2024-06-23 ENCOUNTER — Ambulatory Visit: Payer: Self-pay

## 2024-06-23 ENCOUNTER — Encounter: Payer: Self-pay | Admitting: Student in an Organized Health Care Education/Training Program

## 2024-06-23 ENCOUNTER — Other Ambulatory Visit: Payer: Self-pay

## 2024-06-23 ENCOUNTER — Ambulatory Visit (INDEPENDENT_AMBULATORY_CARE_PROVIDER_SITE_OTHER): Admitting: Student in an Organized Health Care Education/Training Program

## 2024-06-23 VITALS — BP 145/63 | HR 85 | Ht 59.0 in | Wt 236.4 lb

## 2024-06-23 DIAGNOSIS — I10 Essential (primary) hypertension: Secondary | ICD-10-CM

## 2024-06-23 DIAGNOSIS — R5381 Other malaise: Secondary | ICD-10-CM | POA: Diagnosis not present

## 2024-06-23 DIAGNOSIS — I872 Venous insufficiency (chronic) (peripheral): Secondary | ICD-10-CM

## 2024-06-23 MED ORDER — CHLORTHALIDONE 25 MG PO TABS
25.0000 mg | ORAL_TABLET | Freq: Every day | ORAL | 1 refills | Status: DC
  Filled 2024-06-23 – 2024-06-30 (×3): qty 90, 90d supply, fill #0

## 2024-06-23 NOTE — Assessment & Plan Note (Addendum)
 Chronic edema and pain in the knees and feet are likely due to arthritis, decreased mobility, and amlodipine  use. There is no cellulitis or abscess, and redness is minimal. Recommended wearing compression stockings for 12 hours daily and elevating legs when sitting. Encourage increased walking with a rolling walker. Discontinue amlodipine  and initiate chlorthalidone  for diuretic effect. Schedule a follow-up in 2-4 weeks to assess blood pressure and leg swelling.  He is normally also used Lasix  20 mg up until about 2023.  Last echocardiogram 2019 with signs of elevated diastolic filling pressures.  Does not look volume overloaded in other areas, no pulmonary edema today.  I do not think we need to reinitiate Lasix  right now, but can consider in the future.

## 2024-06-23 NOTE — Assessment & Plan Note (Signed)
 I had a frank conversation with the patient and her daughter.  She has had multiple acute visits with me over the last few months.  Seems to be struggling with caring for her husband who has dementia, and her own functional status seems to be declining as well.  They are considering hiring a care aide to help at home.  They do not have much family in the immediate area, still live at home by themselves.  We talked about options of moving to an assisted living facility, which they want to avoid.  We talked about their quality of life and safety at home.  We talked about options for [facility], which they are going to research.  Currently they would only consider moving to an assisted living or skilled nursing level if there was a hospitalization or one of them passed away.  I did recommend more support at home to hopefully improve quality of life and safety.

## 2024-06-23 NOTE — Patient Instructions (Signed)
  VISIT SUMMARY: During your visit, we discussed your leg pain and swelling, which you associate with previous episodes of cellulitis. We also addressed your chronic knee and foot pain, your history of falls, and your concerns about your current blood pressure medication.  YOUR PLAN: -BILATERAL LOWER EXTREMITY EDEMA WITH CHRONIC KNEE AND FOOT PAIN: Your leg swelling and pain are likely due to arthritis, decreased mobility, and your current blood pressure medication. There is no sign of infection. You should wear compression stockings for 12 hours daily and elevate your legs when sitting. Try to walk more using a rolling walker. We will change your blood pressure medication to one that may help reduce the swelling. Please follow up in 2-4 weeks to check on your blood pressure and leg swelling.  -HYPERTENSION: Your high blood pressure is being managed, but the current medication may be causing swelling. We will stop your current medication and start you on a new one that has a mild diuretic effect to help with the swelling.  INSTRUCTIONS: Please schedule a follow-up appointment in 2-4 weeks to assess your blood pressure and leg swelling.

## 2024-06-23 NOTE — Telephone Encounter (Signed)
 FYI Only or Action Required?: FYI only for provider.  Patient was last seen in primary care on 05/27/2024 by Jerrell Cleatus Ned, MD.  Called Nurse Triage reporting Leg Pain.  Symptoms began several days ago.  Interventions attempted: Nothing.  Symptoms are: unchanged.  Triage Disposition: See HCP Within 4 Hours (Or PCP Triage)  Patient/caregiver understands and will follow disposition?: Yes, will follow disposition  Copied from CRM #[id number]. Topic: Clinical - Red Word Triage >> Jun 23, 2024  8:01 AM Thersia BROCKS wrote: [facility] that prompted transfer to Nurse Triage: right leg has been very painful, diagnose with cellulitis a couple of months ago and thinks it may be coming back, also is having some knee tightness Reason for Disposition  [1] SEVERE pain (e.g., excruciating, unable to do any normal activities) AND [2] not improved after 2 hours of pain medicine  Answer Assessment - Initial Assessment Questions 1. ONSET: When did the pain start?      3 days ago  2. LOCATION: Where is the pain located?      R leg, knee hard to bend, also believes she has cellulitis in her R shin area 3. PAIN: How bad is the pain?    (Scale 1-10; or mild, moderate, severe)     10 4. WORK OR EXERCISE: Has there been any recent work or exercise that involved this part of the body?      denies 5. CAUSE: What do you think is causing the leg pain?     cellulitis 6. OTHER SYMPTOMS: Do you have any other symptoms? (e.g., chest pain, back pain, breathing difficulty, swelling, rash, fever, numbness, weakness)     denies  Protocols used: Leg Pain-A-AH

## 2024-06-23 NOTE — Progress Notes (Signed)
 Acute Office Visit  Subjective:     Patient ID: Ann Conley, female    DOB: 06-28-1934, 89 y.o.   MRN: [id number]  Chief Complaint  Patient presents with   Knee Pain    Right. Sx started over the weekend. Feels very stiff. Hard to move left knee.    Leg Pain    Right. Over the weekend    HPI  Discussed the use of AI scribe software for clinical note transcription with the patient, who gave verbal consent to proceed.  History of Present Illness Ann Conley is an 89 year old female with a history of cellulitis who presents with leg pain and swelling.  She experiences leg pain and swelling, which she associates with previous episodes of cellulitis. The pain is located in her leg, similar to past cellulitis episodes, and began on Friday afternoon. There is minimal redness, and she denies fevers or chills. The pain is persistent and worsens with walking, sitting down, and getting up.  She has bilateral knee pain, described as tight and painful during movement, such as sitting down, getting up, and walking. This significantly impacts her mobility and ability to care for her husband, who has dementia.  She has used compression stockings for years, as her husband also wears them. Her feet are constantly swollen, and the swelling in her legs is tender. Attempts to walk more result in significant pain.  She practices breathing exercises to assist herself and her husband. She can walk around her cul-de-sac once or twice but experiences shortness of breath depending on what she is carrying. She is unable to walk up a flight of stairs due to balance issues.  She has a history of falls, including one on Christmas Eve and another six months ago, attributed to balance issues. Her legs have been more swollen recently, particularly after her falls.  She is currently taking amlodipine  for blood pressure but wishes to discontinue it due to concerns about weight gain and swelling. She  recalls being on hydrochlorothiazide  about ten years ago, which caused leg pain, leading to its discontinuation.  She experiences migraine aura almost daily, describing it as a frequent occurrence.      Objective:    BP (!) 145/63 (BP Location: Right Arm, Patient Position: Sitting, Cuff Size: Large)   Pulse 85   Ht 4' 11 (1.499 m)   Wt 236 lb 6.4 oz (107.2 kg)   SpO2 96%   BMI 47.75 kg/m    Physical Exam  Gen: High frailty of advanced age Ext: Bilateral lower extremities have 2+ pitting edema, tenderness on palpation.  Mild erythema on the right leg, no erythema on the left.  No skin breakdown or purulence. Lungs: Unlabored, clear throughout with no crackles  POCUS: Ultrasound of the right leg of the area of redness showed cobblestoning consistent with subcutaneous edema.  No focal abscess noted.      Assessment & Plan:    Problem List Items Addressed This Visit       Unprioritized   Essential hypertension   Hypertension management is complicated by edema, possibly due to amlodipine , with blood pressure slightly elevated. Discontinue amlodipine  and start chlorthalidone  25 mg daily.  She was using hydrochlorothiazide  in the past, discontinued about 10 years ago.  No history of complications, will bring back in 2 weeks for BMP check.       Relevant Medications   chlorthalidone  (HYGROTON ) 25 MG tablet   Stasis dermatitis of both legs - Primary  Chronic edema and pain in the knees and feet are likely due to arthritis, decreased mobility, and amlodipine  use. There is no cellulitis or abscess, and redness is minimal. Recommended wearing compression stockings for 12 hours daily and elevating legs when sitting. Encourage increased walking with a rolling walker. Discontinue amlodipine  and initiate chlorthalidone  for diuretic effect. Schedule a follow-up in 2-4 weeks to assess blood pressure and leg swelling.  He is normally also used Lasix  20 mg up until about 2023.  Last  echocardiogram 2019 with signs of elevated diastolic filling pressures.  Does not look volume overloaded in other areas, no pulmonary edema today.  I do not think we need to reinitiate Lasix  right now, but can consider in the future.      Relevant Medications   chlorthalidone  (HYGROTON ) 25 MG tablet   Declining functional status   I had a frank conversation with the patient and her daughter.  She has had multiple acute visits with me over the last few months.  Seems to be struggling with caring for her husband who has dementia, and her own functional status seems to be declining as well.  They are considering hiring a care aide to help at home.  They do not have much family in the immediate area, still live at home by themselves.  We talked about options of moving to an assisted living facility, which they want to avoid.  We talked about their quality of life and safety at home.  We talked about options for [facility], which they are going to research.  Currently they would only consider moving to an assisted living or skilled nursing level if there was a hospitalization or one of them passed away.  I did recommend more support at home to hopefully improve quality of life and safety.       Meds ordered this encounter  Medications   chlorthalidone  (HYGROTON ) 25 MG tablet    Sig: Take 1 tablet (25 mg total) by mouth daily.    Dispense:  90 tablet    Refill:  1    Return in about 2 weeks (around 07/07/2024) for HTN and leg edema management.  Cleatus Debby Specking, MD

## 2024-06-23 NOTE — Assessment & Plan Note (Signed)
 Hypertension management is complicated by edema, possibly due to amlodipine , with blood pressure slightly elevated. Discontinue amlodipine  and start chlorthalidone  25 mg daily.  She was using hydrochlorothiazide  in the past, discontinued about 10 years ago.  No history of complications, will bring back in 2 weeks for BMP check.

## 2024-06-24 ENCOUNTER — Other Ambulatory Visit (HOSPITAL_COMMUNITY): Payer: Self-pay

## 2024-06-25 ENCOUNTER — Encounter: Payer: Self-pay | Admitting: Pharmacist

## 2024-06-25 ENCOUNTER — Other Ambulatory Visit: Payer: Self-pay

## 2024-06-27 ENCOUNTER — Ambulatory Visit: Admitting: Family Medicine

## 2024-06-30 ENCOUNTER — Other Ambulatory Visit (HOSPITAL_COMMUNITY): Payer: Self-pay

## 2024-06-30 ENCOUNTER — Other Ambulatory Visit: Payer: Self-pay

## 2024-07-03 ENCOUNTER — Other Ambulatory Visit: Payer: Self-pay

## 2024-07-03 ENCOUNTER — Encounter (HOSPITAL_COMMUNITY): Payer: Self-pay

## 2024-07-03 ENCOUNTER — Emergency Department (HOSPITAL_COMMUNITY)

## 2024-07-03 ENCOUNTER — Emergency Department (HOSPITAL_COMMUNITY)
Admission: EM | Admit: 2024-07-03 | Discharge: 2024-07-04 | Disposition: A | Discharge status: Home / Self Care | Source: Home / Self Care | Attending: Emergency Medicine | Admitting: Emergency Medicine

## 2024-07-03 DIAGNOSIS — R4701 Aphasia: Secondary | ICD-10-CM | POA: Diagnosis not present

## 2024-07-03 DIAGNOSIS — M1990 Unspecified osteoarthritis, unspecified site: Secondary | ICD-10-CM | POA: Diagnosis not present

## 2024-07-03 DIAGNOSIS — I700 Atherosclerosis of aorta: Secondary | ICD-10-CM | POA: Diagnosis not present

## 2024-07-03 DIAGNOSIS — K5730 Diverticulosis of large intestine without perforation or abscess without bleeding: Secondary | ICD-10-CM | POA: Diagnosis not present

## 2024-07-03 DIAGNOSIS — R4182 Altered mental status, unspecified: Secondary | ICD-10-CM | POA: Diagnosis not present

## 2024-07-03 DIAGNOSIS — K8689 Other specified diseases of pancreas: Secondary | ICD-10-CM | POA: Diagnosis not present

## 2024-07-03 DIAGNOSIS — G9341 Metabolic encephalopathy: Secondary | ICD-10-CM | POA: Diagnosis not present

## 2024-07-03 DIAGNOSIS — Z8673 Personal history of transient ischemic attack (TIA), and cerebral infarction without residual deficits: Secondary | ICD-10-CM | POA: Diagnosis not present

## 2024-07-03 DIAGNOSIS — R933 Abnormal findings on diagnostic imaging of other parts of digestive tract: Secondary | ICD-10-CM | POA: Diagnosis not present

## 2024-07-03 DIAGNOSIS — Z79899 Other long term (current) drug therapy: Secondary | ICD-10-CM | POA: Diagnosis not present

## 2024-07-03 DIAGNOSIS — R410 Disorientation, unspecified: Secondary | ICD-10-CM | POA: Diagnosis not present

## 2024-07-03 DIAGNOSIS — K219 Gastro-esophageal reflux disease without esophagitis: Secondary | ICD-10-CM | POA: Diagnosis not present

## 2024-07-03 DIAGNOSIS — Z5329 Procedure and treatment not carried out because of patient's decision for other reasons: Secondary | ICD-10-CM | POA: Insufficient documentation

## 2024-07-03 DIAGNOSIS — K4020 Bilateral inguinal hernia, without obstruction or gangrene, not specified as recurrent: Secondary | ICD-10-CM | POA: Diagnosis not present

## 2024-07-03 DIAGNOSIS — I2721 Secondary pulmonary arterial hypertension: Secondary | ICD-10-CM | POA: Diagnosis not present

## 2024-07-03 DIAGNOSIS — R519 Headache, unspecified: Secondary | ICD-10-CM | POA: Diagnosis not present

## 2024-07-03 DIAGNOSIS — E871 Hypo-osmolality and hyponatremia: Secondary | ICD-10-CM | POA: Diagnosis not present

## 2024-07-03 DIAGNOSIS — Z6841 Body Mass Index (BMI) 40.0 and over, adult: Secondary | ICD-10-CM | POA: Diagnosis not present

## 2024-07-03 DIAGNOSIS — Z91199 Patient's noncompliance with other medical treatment and regimen due to unspecified reason: Secondary | ICD-10-CM | POA: Diagnosis not present

## 2024-07-03 DIAGNOSIS — J45909 Unspecified asthma, uncomplicated: Secondary | ICD-10-CM | POA: Diagnosis not present

## 2024-07-03 DIAGNOSIS — Z7982 Long term (current) use of aspirin: Secondary | ICD-10-CM | POA: Diagnosis not present

## 2024-07-03 DIAGNOSIS — I6782 Cerebral ischemia: Secondary | ICD-10-CM | POA: Diagnosis not present

## 2024-07-03 DIAGNOSIS — Z8249 Family history of ischemic heart disease and other diseases of the circulatory system: Secondary | ICD-10-CM | POA: Diagnosis not present

## 2024-07-03 DIAGNOSIS — Z833 Family history of diabetes mellitus: Secondary | ICD-10-CM | POA: Diagnosis not present

## 2024-07-03 DIAGNOSIS — Z781 Physical restraint status: Secondary | ICD-10-CM | POA: Diagnosis not present

## 2024-07-03 DIAGNOSIS — E785 Hyperlipidemia, unspecified: Secondary | ICD-10-CM | POA: Diagnosis not present

## 2024-07-03 DIAGNOSIS — G43109 Migraine with aura, not intractable, without status migrainosus: Secondary | ICD-10-CM | POA: Diagnosis not present

## 2024-07-03 DIAGNOSIS — G4733 Obstructive sleep apnea (adult) (pediatric): Secondary | ICD-10-CM | POA: Diagnosis not present

## 2024-07-03 DIAGNOSIS — Z853 Personal history of malignant neoplasm of breast: Secondary | ICD-10-CM | POA: Diagnosis not present

## 2024-07-03 DIAGNOSIS — G459 Transient cerebral ischemic attack, unspecified: Secondary | ICD-10-CM | POA: Diagnosis not present

## 2024-07-03 DIAGNOSIS — I2510 Atherosclerotic heart disease of native coronary artery without angina pectoris: Secondary | ICD-10-CM | POA: Diagnosis not present

## 2024-07-03 DIAGNOSIS — R531 Weakness: Secondary | ICD-10-CM | POA: Diagnosis not present

## 2024-07-03 DIAGNOSIS — I10 Essential (primary) hypertension: Secondary | ICD-10-CM | POA: Diagnosis not present

## 2024-07-03 LAB — CBC WITH DIFFERENTIAL/PLATELET
Abs Immature Granulocytes: 0.03 K/uL (ref 0.00–0.07)
Basophils Absolute: 0.1 K/uL (ref 0.0–0.1)
Basophils Relative: 1 %
Eosinophils Absolute: 0.1 K/uL (ref 0.0–0.5)
Eosinophils Relative: 1 %
HCT: 40.6 % (ref 36.0–46.0)
Hemoglobin: 12.9 g/dL (ref 12.0–15.0)
Immature Granulocytes: 0 %
Lymphocytes Relative: 17 %
Lymphs Abs: 1.2 K/uL (ref 0.7–4.0)
MCH: 28.2 pg (ref 26.0–34.0)
MCHC: 31.8 g/dL (ref 30.0–36.0)
MCV: 88.6 fL (ref 80.0–100.0)
Monocytes Absolute: 0.5 K/uL (ref 0.1–1.0)
Monocytes Relative: 7 %
Neutro Abs: 5.1 K/uL (ref 1.7–7.7)
Neutrophils Relative %: 74 %
Platelets: 233 K/uL (ref 150–400)
RBC: 4.58 MIL/uL (ref 3.87–5.11)
RDW: 14.6 % (ref 11.5–15.5)
WBC: 6.9 K/uL (ref 4.0–10.5)
nRBC: 0 % (ref 0.0–0.2)

## 2024-07-03 LAB — COMPREHENSIVE METABOLIC PANEL WITH GFR
ALT: 15 U/L (ref 0–44)
AST: 23 U/L (ref 15–41)
Albumin: 3.7 g/dL (ref 3.5–5.0)
Alkaline Phosphatase: 80 U/L (ref 38–126)
Anion gap: 12 (ref 5–15)
BUN: 15 mg/dL (ref 8–23)
CO2: 25 mmol/L (ref 22–32)
Calcium: 9.5 mg/dL (ref 8.9–10.3)
Chloride: 97 mmol/L — ABNORMAL LOW (ref 98–111)
Creatinine, Ser: 0.62 mg/dL (ref 0.44–1.00)
GFR, Estimated: >60 mL/min (ref >60)
Glucose, Bld: 127 mg/dL — ABNORMAL HIGH (ref 70–99)
Potassium: 4.2 mmol/L (ref 3.5–5.1)
Sodium: 134 mmol/L — ABNORMAL LOW (ref 135–145)
Total Bilirubin: 0.8 mg/dL (ref 0.0–1.2)
Total Protein: 6.6 g/dL (ref 6.5–8.1)

## 2024-07-03 MED ORDER — MIDAZOLAM HCL 2 MG/2ML IJ SOLN
1.0000 mg | Freq: Once | INTRAMUSCULAR | Status: AC | PRN
  Administered 2024-07-03: 1 mg via INTRAVENOUS
  Filled 2024-07-03: qty 2

## 2024-07-03 MED ORDER — GADOBUTROL 1 MMOL/ML IV SOLN
10.0000 mL | Freq: Once | INTRAVENOUS | Status: AC | PRN
  Administered 2024-07-03: 10 mL via INTRAVENOUS

## 2024-07-03 NOTE — ED Notes (Signed)
 Pt states her husband has dementia and being in the hospital isn't helpful. Pt requesting to see if she can go home. RN will notify EDP

## 2024-07-03 NOTE — ED Notes (Signed)
 RN walked with pt to restroom. Pt ambulated with cane with a steady gait. Pt denied any weakness, dizziness, and headache at this time.

## 2024-07-03 NOTE — ED Triage Notes (Signed)
 PT bib ems from home. Patients family noticed that she stopped using her left arm and was speaking gibberish around 3pm. They state that it only lasted 10 minutes and EMS reports on their arrival patient was at baseline with the only complaint being a headache. EMS reports patient is hypertensive.

## 2024-07-03 NOTE — ED Provider Notes (Signed)
 [place] EMERGENCY DEPARTMENT AT [facility] Provider Note   CSN: [id number] Arrival date & time: 07/03/24  8371     Patient presents with: Aphasia (/) and Weakness   Ann Conley is a 89 y.o. female.  {Add pertinent medical, surgical, social history, OB history to HPI:32927} HPI   89 year old female with past medical history of migraines with auras presents emergency department after an approximately 10-minute episode where she was speaking gibberish and unable to use her left arm.  Patient typically has visual auras associated with her migraines followed by headache.  However she states that this seemed different than her typical aura.  The symptoms happened around 2:30 PM, approximately 2 hours prior to arrival.  They have resolved, she is back to baseline, no significant headache.  There was no syncope or loss of consciousness, no focal shaking.  Denies any other acute changes in her health/medications.  Prior to Admission medications   Medication Sig Start Date End Date Taking? Authorizing Provider  chlorthalidone  (HYGROTON ) 25 MG tablet Take 1 tablet (25 mg total) by mouth daily. 06/23/24   Jerrell Cleatus Ned, MD  ibuprofen  (ADVIL ) 800 MG tablet Take 1 tablet (800 mg total) by mouth every 8 (eight) hours as needed for headache. 04/03/24   [name], Katherine E, MD  ibuprofen  (ADVIL ) 800 MG tablet Take 1 tablet (800 mg total) by mouth every 8 (eight) hours as needed for headache. Patient not taking: Reported on 05/27/2024 04/01/24   [name], Katherine E, MD  Magnesium 250 MG TABS Take 250 mg by mouth as needed.    [provider]  Vitamin D , Ergocalciferol , (DRISDOL ) 1.25 MG (50000 UNIT) CAPS capsule Take 1 capsule (50,000 Units total) by mouth every 7 (seven) days. 02/15/24   Mahlon Comer BRAVO, MD    Allergies: Codeine    Review of Systems  Constitutional:  Negative for fever.  Respiratory:  Negative for shortness of breath.   Cardiovascular:  Negative  for chest pain.  Gastrointestinal:  Negative for abdominal pain, diarrhea and vomiting.  Skin:  Negative for rash.  Neurological:  Positive for speech difficulty and weakness. Negative for seizures, syncope, facial asymmetry, numbness and headaches.    Updated Vital Signs BP (!) 161/73   Pulse 81   Temp 97.9 F (36.6 C)   Resp (!) 22   Ht 4' 11 (1.499 m)   Wt 102.1 kg   SpO2 95%   BMI 45.44 kg/m   Physical Exam Vitals and nursing note reviewed.  Constitutional:      Appearance: Normal appearance.  HENT:     Head: Normocephalic.     Mouth/Throat:     Mouth: Mucous membranes are moist.  Eyes:     Extraocular Movements: Extraocular movements intact.     Pupils: Pupils are equal, round, and reactive to light.  Cardiovascular:     Rate and Rhythm: Normal rate.  Pulmonary:     Effort: Pulmonary effort is normal. No respiratory distress.  Abdominal:     Palpations: Abdomen is soft.     Tenderness: There is no abdominal tenderness.  Musculoskeletal:        General: No swelling or deformity.  Skin:    General: Skin is warm.  Neurological:     General: No focal deficit present.     Mental Status: She is alert and oriented to person, place, and time. Mental status is at baseline.  Psychiatric:        Mood and Affect: Mood normal.     (  all labs ordered are listed, but only abnormal results are displayed) Labs Reviewed  COMPREHENSIVE METABOLIC PANEL WITH GFR - Abnormal; Notable for the following components:      Result Value   Sodium 134 (*)    Chloride 97 (*)    Glucose, Bld 127 (*)    All other components within normal limits  CBC WITH DIFFERENTIAL/PLATELET    EKG: EKG Interpretation Date/Time:  Thursday July 03 2024 16:39:41 EDT Ventricular Rate:  84 PR Interval:  196 QRS Duration:  106 QT Interval:  379 QTC Calculation: 448 R Axis:   -45  Text Interpretation: Sinus rhythm LAD, consider left anterior fascicular block Abnormal R-wave progression, late  transition simimlar to previous Confirmed by Bari Flank [phone]) on 07/03/2024 6:10:52 PM  Radiology: No results found.  {Document cardiac monitor, telemetry assessment procedure when appropriate:32947} Procedures   Medications Ordered in the ED  midazolam  (VERSED ) injection 1 mg (has no administration in time range)      {Click here for ABCD2, HEART and other calculators REFRESH Note before signing:1}                              Medical Decision Making Amount and/or Complexity of Data Reviewed Labs: ordered. Radiology: ordered.  Risk Prescription drug management.   ***  {Document critical care time when appropriate  Document review of labs and clinical decision tools ie CHADS2VASC2, etc  Document your independent review of radiology images and any outside records  Document your discussion with family members, caretakers and with consultants  Document social determinants of health affecting pt's care  Document your decision making why or why not admission, treatments were needed:32947:::1}   Final diagnoses:  None    ED Discharge Orders     None

## 2024-07-03 NOTE — ED Notes (Signed)
 Patient transported to MRI

## 2024-07-04 ENCOUNTER — Emergency Department (HOSPITAL_COMMUNITY)

## 2024-07-04 ENCOUNTER — Encounter (HOSPITAL_COMMUNITY): Payer: Self-pay

## 2024-07-04 ENCOUNTER — Telehealth: Payer: Self-pay

## 2024-07-04 ENCOUNTER — Other Ambulatory Visit: Payer: Self-pay

## 2024-07-04 ENCOUNTER — Other Ambulatory Visit (HOSPITAL_COMMUNITY): Payer: Self-pay

## 2024-07-04 ENCOUNTER — Inpatient Hospital Stay (HOSPITAL_COMMUNITY)
Admission: EM | Admit: 2024-07-04 | Discharge: 2024-07-06 | DRG: 071 | Disposition: A | Discharge status: Home Health | Attending: Internal Medicine | Admitting: Internal Medicine

## 2024-07-04 DIAGNOSIS — I2721 Secondary pulmonary arterial hypertension: Secondary | ICD-10-CM | POA: Diagnosis not present

## 2024-07-04 DIAGNOSIS — G4733 Obstructive sleep apnea (adult) (pediatric): Secondary | ICD-10-CM | POA: Insufficient documentation

## 2024-07-04 DIAGNOSIS — I2510 Atherosclerotic heart disease of native coronary artery without angina pectoris: Secondary | ICD-10-CM | POA: Insufficient documentation

## 2024-07-04 DIAGNOSIS — K4020 Bilateral inguinal hernia, without obstruction or gangrene, not specified as recurrent: Secondary | ICD-10-CM | POA: Diagnosis not present

## 2024-07-04 DIAGNOSIS — R9082 White matter disease, unspecified: Secondary | ICD-10-CM | POA: Diagnosis not present

## 2024-07-04 DIAGNOSIS — Z8249 Family history of ischemic heart disease and other diseases of the circulatory system: Secondary | ICD-10-CM | POA: Diagnosis not present

## 2024-07-04 DIAGNOSIS — Z7982 Long term (current) use of aspirin: Secondary | ICD-10-CM | POA: Insufficient documentation

## 2024-07-04 DIAGNOSIS — R4182 Altered mental status, unspecified: Secondary | ICD-10-CM | POA: Diagnosis not present

## 2024-07-04 DIAGNOSIS — R410 Disorientation, unspecified: Secondary | ICD-10-CM | POA: Diagnosis not present

## 2024-07-04 DIAGNOSIS — E785 Hyperlipidemia, unspecified: Secondary | ICD-10-CM | POA: Diagnosis present

## 2024-07-04 DIAGNOSIS — R933 Abnormal findings on diagnostic imaging of other parts of digestive tract: Secondary | ICD-10-CM | POA: Insufficient documentation

## 2024-07-04 DIAGNOSIS — K219 Gastro-esophageal reflux disease without esophagitis: Secondary | ICD-10-CM | POA: Diagnosis not present

## 2024-07-04 DIAGNOSIS — Z8673 Personal history of transient ischemic attack (TIA), and cerebral infarction without residual deficits: Secondary | ICD-10-CM | POA: Insufficient documentation

## 2024-07-04 DIAGNOSIS — Z885 Allergy status to narcotic agent status: Secondary | ICD-10-CM

## 2024-07-04 DIAGNOSIS — R4701 Aphasia: Secondary | ICD-10-CM | POA: Diagnosis present

## 2024-07-04 DIAGNOSIS — I700 Atherosclerosis of aorta: Secondary | ICD-10-CM | POA: Insufficient documentation

## 2024-07-04 DIAGNOSIS — J45909 Unspecified asthma, uncomplicated: Secondary | ICD-10-CM | POA: Diagnosis not present

## 2024-07-04 DIAGNOSIS — E871 Hypo-osmolality and hyponatremia: Secondary | ICD-10-CM | POA: Insufficient documentation

## 2024-07-04 DIAGNOSIS — Z853 Personal history of malignant neoplasm of breast: Secondary | ICD-10-CM | POA: Diagnosis not present

## 2024-07-04 DIAGNOSIS — Z6841 Body Mass Index (BMI) 40.0 and over, adult: Secondary | ICD-10-CM | POA: Diagnosis not present

## 2024-07-04 DIAGNOSIS — G43109 Migraine with aura, not intractable, without status migrainosus: Secondary | ICD-10-CM | POA: Diagnosis not present

## 2024-07-04 DIAGNOSIS — M1990 Unspecified osteoarthritis, unspecified site: Secondary | ICD-10-CM | POA: Diagnosis present

## 2024-07-04 DIAGNOSIS — R4781 Slurred speech: Secondary | ICD-10-CM | POA: Diagnosis not present

## 2024-07-04 DIAGNOSIS — Z781 Physical restraint status: Secondary | ICD-10-CM | POA: Diagnosis not present

## 2024-07-04 DIAGNOSIS — G9341 Metabolic encephalopathy: Principal | ICD-10-CM | POA: Diagnosis present

## 2024-07-04 DIAGNOSIS — K8689 Other specified diseases of pancreas: Secondary | ICD-10-CM | POA: Diagnosis not present

## 2024-07-04 DIAGNOSIS — Z833 Family history of diabetes mellitus: Secondary | ICD-10-CM

## 2024-07-04 DIAGNOSIS — K5730 Diverticulosis of large intestine without perforation or abscess without bleeding: Secondary | ICD-10-CM | POA: Diagnosis not present

## 2024-07-04 DIAGNOSIS — Z91199 Patient's noncompliance with other medical treatment and regimen due to unspecified reason: Secondary | ICD-10-CM | POA: Insufficient documentation

## 2024-07-04 DIAGNOSIS — I10 Essential (primary) hypertension: Secondary | ICD-10-CM | POA: Diagnosis present

## 2024-07-04 DIAGNOSIS — F1090 Alcohol use, unspecified, uncomplicated: Secondary | ICD-10-CM | POA: Insufficient documentation

## 2024-07-04 DIAGNOSIS — Z79899 Other long term (current) drug therapy: Secondary | ICD-10-CM | POA: Diagnosis not present

## 2024-07-04 DIAGNOSIS — Z90711 Acquired absence of uterus with remaining cervical stump: Secondary | ICD-10-CM

## 2024-07-04 DIAGNOSIS — Z803 Family history of malignant neoplasm of breast: Secondary | ICD-10-CM

## 2024-07-04 DIAGNOSIS — I491 Atrial premature depolarization: Secondary | ICD-10-CM | POA: Diagnosis not present

## 2024-07-04 LAB — COMPREHENSIVE METABOLIC PANEL WITH GFR
ALT: 17 U/L (ref 0–44)
AST: 23 U/L (ref 15–41)
Albumin: 3.8 g/dL (ref 3.5–5.0)
Alkaline Phosphatase: 73 U/L (ref 38–126)
Anion gap: 11 (ref 5–15)
BUN: 13 mg/dL (ref 8–23)
CO2: 27 mmol/L (ref 22–32)
Calcium: 9.5 mg/dL (ref 8.9–10.3)
Chloride: 93 mmol/L — ABNORMAL LOW (ref 98–111)
Creatinine, Ser: 0.63 mg/dL (ref 0.44–1.00)
GFR, Estimated: >60 mL/min (ref >60)
Glucose, Bld: 129 mg/dL — ABNORMAL HIGH (ref 70–99)
Potassium: 3.5 mmol/L (ref 3.5–5.1)
Sodium: 131 mmol/L — ABNORMAL LOW (ref 135–145)
Total Bilirubin: 0.8 mg/dL (ref 0.0–1.2)
Total Protein: 6.8 g/dL (ref 6.5–8.1)

## 2024-07-04 LAB — CBC
HCT: 40.2 % (ref 36.0–46.0)
Hemoglobin: 13 g/dL (ref 12.0–15.0)
MCH: 28 pg (ref 26.0–34.0)
MCHC: 32.3 g/dL (ref 30.0–36.0)
MCV: 86.5 fL (ref 80.0–100.0)
Platelets: 240 K/uL (ref 150–400)
RBC: 4.65 MIL/uL (ref 3.87–5.11)
RDW: 14.5 % (ref 11.5–15.5)
WBC: 9.1 K/uL (ref 4.0–10.5)
nRBC: 0 % (ref 0.0–0.2)

## 2024-07-04 LAB — URINALYSIS, ROUTINE W REFLEX MICROSCOPIC
Bilirubin Urine: NEGATIVE
Glucose, UA: NEGATIVE mg/dL
Hgb urine dipstick: NEGATIVE
Ketones, ur: NEGATIVE mg/dL
Nitrite: NEGATIVE
Protein, ur: NEGATIVE mg/dL
Specific Gravity, Urine: 1.011 (ref 1.005–1.030)
pH: 5 (ref 5.0–8.0)

## 2024-07-04 LAB — AMMONIA: Ammonia: 20 umol/L (ref 9–35)

## 2024-07-04 LAB — CBG MONITORING, ED: Glucose-Capillary: 116 mg/dL — ABNORMAL HIGH (ref 70–99)

## 2024-07-04 MED ORDER — STROKE: EARLY STAGES OF RECOVERY BOOK
Freq: Once | Status: DC
Start: 2024-07-05 — End: 2024-07-06

## 2024-07-04 MED ORDER — ASPIRIN 81 MG PO CHEW
81.0000 mg | CHEWABLE_TABLET | Freq: Every day | ORAL | 0 refills | Status: AC
  Filled 2024-07-04: qty 30, 30d supply, fill #0

## 2024-07-04 MED ORDER — LORAZEPAM 2 MG/ML IJ SOLN
1.0000 mg | Freq: Once | INTRAMUSCULAR | Status: AC
  Administered 2024-07-04: 1 mg via INTRAVENOUS
  Filled 2024-07-04: qty 1

## 2024-07-04 MED ORDER — IOHEXOL 350 MG/ML SOLN
75.0000 mL | Freq: Once | INTRAVENOUS | Status: AC | PRN
  Administered 2024-07-04: 75 mL via INTRAVENOUS

## 2024-07-04 MED ORDER — LACTATED RINGERS IV BOLUS
500.0000 mL | Freq: Once | INTRAVENOUS | Status: AC
  Administered 2024-07-04: 500 mL via INTRAVENOUS

## 2024-07-04 NOTE — Telephone Encounter (Signed)
 I wish pt would have stayed for admission.  At this time, they need to call their Neurologist (she is seen at [facility]) to see what they recommend.  If she has additional symptoms over the weekend she will need to go back to the ER for evaluation as it is common for a TIA to be a warning sign of an impending stroke.

## 2024-07-04 NOTE — Discharge Instructions (Signed)
 You have been seen and discharged from the emergency department.  The MRI of your brain did not show any acute findings.  But the concern was that you experienced a transient ischemic attack today.  I consulted with neurology who recommended you get admitted to the hospital for further evaluation.  Having a TIA increases your risk for another TIA or permanent stroke.  This is the reason for recommended admission.  Please start taking a baby aspirin  daily.  You are leaving AGAINST MEDICAL ADVICE.  You have chosen to leave the emergency department AGAINST MEDICAL ADVICE.  I have explained to you your testing results and the need for further evaluation/admission.  You have verbalized understanding of these results and plan and are choosing to leave the hospital AGAINST MEDICAL ADVICE. You understand the risks associated with leaving without further evaluation/treatment. If you have any worsening symptoms or choose to be treated please return immediately to emergency department.

## 2024-07-04 NOTE — H&P (Incomplete)
  History and Physical    Patient: Ann Conley FMW:[id number] DOB: [birth year] DOA: 07/04/2024 DOS: the patient was seen and examined on 07/04/2024 PCP: Mahlon Comer BRAVO, MD  Patient coming from: Home  Chief Complaint:  Chief Complaint  Patient presents with  . Altered Mental Status   HPI: Ann Conley is a 89 y.o. female with medical history significant of  Review of Systems: {ROS_Text:26778} Past Medical History:  Diagnosis Date  . Anemia   . Blood transfusion complicating pregnancy 1961  . GERD (gastroesophageal reflux disease)   . History of chicken pox   . Hypertension   . Migraine    occular  . TIA (transient ischemic attack)    Past Surgical History:  Procedure Laterality Date  . BREAST BIOPSY    . CHOLECYSTECTOMY    . PARTIAL HYSTERECTOMY     Social History:  reports that she has never smoked. She has never used smokeless tobacco. She reports current alcohol use. She reports that she does not use drugs.  Allergies  Allergen Reactions  . Codeine Nausea And Vomiting    Family History  Problem Relation Age of Onset  . Breast cancer Mother   . Heart disease Father   . Heart disease Sister        pacemaker  . Diabetes Sister     Prior to Admission medications   Medication Sig Start Date End Date Taking? Authorizing Provider  aspirin  81 MG chewable tablet Chew 1 tablet (81 mg total) by mouth daily. 07/04/24   Horton, Roxie CHRISTELLA, DO  chlorthalidone  (HYGROTON ) 25 MG tablet Take 1 tablet (25 mg total) by mouth daily. 06/23/24   Jerrell Cleatus Ned, MD  ibuprofen  (ADVIL ) 800 MG tablet Take 1 tablet (800 mg total) by mouth every 8 (eight) hours as needed for headache. 04/03/24   [name], Katherine E, MD  ibuprofen  (ADVIL ) 800 MG tablet Take 1 tablet (800 mg total) by mouth every 8 (eight) hours as needed for headache. Patient not taking: Reported on 05/27/2024 04/01/24   [name], Katherine E, MD  Magnesium 250 MG TABS Take 250 mg by mouth as needed.     [provider]  Vitamin D , Ergocalciferol , (DRISDOL ) 1.25 MG (50000 UNIT) CAPS capsule Take 1 capsule (50,000 Units total) by mouth every 7 (seven) days. 02/15/24   Mahlon Comer BRAVO, MD    Physical Exam: Vitals:   07/04/24 2026 07/04/24 2130 07/04/24 2248 07/04/24 2300  BP: (!) 164/94 107/69  (!) 153/76  Pulse:  95  88  Resp: (!) 21 16    Temp:   98.7 F (37.1 C)   TempSrc:   Axillary   SpO2:  99%  100%  Weight:      Height:       *** Data Reviewed: {Tip this will not be part of the note when signed- Document your independent interpretation of telemetry tracing, EKG, lab, Radiology test or any other diagnostic tests. Add any new diagnostic test ordered today. (Optional):26781} {Results:26384}  Assessment and Plan: No notes have been filed under this hospital service. Service: Hospitalist     Advance Care Planning:   Code Status: Not on file ***  Consults: ***  Family Communication: ***  Severity of Illness: {Observation/Inpatient:21159}  Author: Bradly MARLA Drones, MD 07/04/2024 11:56 PM  For on call review [email or web address].

## 2024-07-04 NOTE — Telephone Encounter (Signed)
 FYI  Patients daughter called back stating that patient is agitated and will get in the bed and out of the bed. She is wanting to know what can be done. I told her that provider has left for the day but an appointment can be made or she can be seen at the ED for immediate help.

## 2024-07-04 NOTE — ED Provider Notes (Incomplete)
 I provided a substantive portion of the care of this patient.  I personally made/approved the management plan for this patient and take responsibility for the patient management. {Remember to document shared critical care using "edcritical" dot phrase:1}

## 2024-07-04 NOTE — Telephone Encounter (Signed)
 Patient's daughter states that patient was taken to ER last night and ER Dr. Elmer to admit patient to be seen by a Neurologist and patient refused and went home. Daughter believes patient might have had a TIA and had some damage. Patient's daughter is looking for recommendations on where she should have her mom seen or how to get her to go back to the ER?   I have scheduled patient a Hospital F/U Visit for September 4th with Dr.[name].

## 2024-07-04 NOTE — ED Notes (Signed)
 Patient requiring constant redirection, making multiple attempts to get out of bed, bed alarm in place and son at bedside. Patients last attempt to get out of bed required son and staff members to get patient back in bed. Patient cleaned, linen changed, pure wick placed and mittens placed on patient also due to patient continuously removing monitoring equipment. Patient having difficulty following directions. CN made aware of safety issues and to call staffing  for a safety sitter.

## 2024-07-04 NOTE — ED Triage Notes (Signed)
 Pt bib [facility] from home  Seen yesterday for aphasic episode. According to family, TIA was mentioned, but pt refused treatment  This morning woke up the same, but now pt complaining of headache and photosensitivity   Family wanted pt to be seen due to progression and new onset of confusion and hallucinations  VS per EMS 147/84 BP 90 HR 94% 26RR 98.1  CBG 131 ETCO2 35  Pt combative with EMS

## 2024-07-04 NOTE — Telephone Encounter (Signed)
 Called patients daughter back and advised her to call patients neuro. She said that she would. I said that if she has any sx over the weekend to take her to the ED but ultimately they would probably recommend she needs to go back to the ED.

## 2024-07-04 NOTE — ED Notes (Signed)
 CCMD contacted again to be placed on monitor

## 2024-07-04 NOTE — Telephone Encounter (Signed)
 Copied from CRM #[id number]. Topic: Clinical - Lab/Test Results >> Jul 04, 2024  1:48 PM Robinson H wrote: Reason for CRM: Patients daughter states patient had an episode last night and was taken to the ER, doctor wanted to admit patient to see a Neurologist and patient refused and went home might have had a TIA might have had some damage wants to know if provider has seen results and has any recommendations  Amos Sor [phone]

## 2024-07-04 NOTE — ED Notes (Signed)
 This RN at bedside. Pt is constantly attempting to get out of the bed. Son-in-law at bedside assisting me in keeping the pt in the bed. Bed alarm set, non slip socks placed on the pt, fall risk bracelet applied. Provider notified of pt being agitated and confused. Unable to do a neuro assessment on the pt due to confusion and agitation. Unable to squeeze my hands, states that the month is December. Easy to distract for a short amount of time to attempt to redirect but consistently trying to get out the bed.

## 2024-07-04 NOTE — ED Notes (Signed)
 Patient transported to CT

## 2024-07-04 NOTE — H&P (Signed)
 History and Physical    Patient: Ann Conley FMW:[id number] DOB: [birth year] DOA: 07/04/2024 DOS: the patient was seen and examined on 07/04/2024 PCP: Mahlon Comer BRAVO, MD  Patient coming from: Home  Chief Complaint:  Chief Complaint  Patient presents with   Altered Mental Status   HPI: Ann Conley is a 89 y.o. female with medical history significant of essential hypertension, migraine with over, GERD, breast cancer, morbid obesity and OA who presented to the hospital with complaint of altered mental status.  Patient had presented to the hospital yesterday with complaint of aphasia and weakness per daughter.  EMS was called patient went through an evaluation which included MRI that demonstrated no acute intracranial abnormality.  Small remote right cerebellar infarct was noted. The patient was subsequently discharged home after refusing admission for possible TIA evaluation. The patient returned home and per daughter, the caretaker she returned home felt that she had returned to baseline. However the daughter found her more confused this morning and she was not speaking. She called the PCP and they recommended that the patient return to the hospital for further evaluation and treatment.  The daughter was also concerned as her mother had a subjective fever and she was more agitated along with the confusion.  Unable to obtain history from patient who had received ativan  prior to my evaluation.   In the ER, BP 153/76, HR 88, RR 16,  and Spo2 99%on RA. Wbc 9.1, Hb/hct 13/40 and platelet count is 240.  Na is 131, K 3.5, Cl 93, co2 27 and glucose of 129. Ammonia was 20. Ct head was negative for any acute intracranial abnormality.  CT chest, abdomen and pelvis demonstrated no acute intrathoracic or intra-abdominal pathology. There was noted pulmonary arterial hypertension, mild dilation of the main pancreatic duct within the body and tail, the pancreas with a focal point of transition best  appreciated within the mid body of pancreas which may relate to a underlying main duct papillary  mucinous neoplasm or underlying main duct stenosis.  Review of Systems: As mentioned in the history of present illness. All other systems reviewed and are negative. Past Medical History:  Diagnosis Date   Anemia    Blood transfusion complicating pregnancy 1961   GERD (gastroesophageal reflux disease)    History of chicken pox    Hypertension    Migraine    occular   TIA (transient ischemic attack)    Past Surgical History:  Procedure Laterality Date   BREAST BIOPSY     CHOLECYSTECTOMY     PARTIAL HYSTERECTOMY     Social History:  reports that she has never smoked. She has never used smokeless tobacco. She reports current alcohol use. She reports that she does not use drugs.  Allergies  Allergen Reactions   Codeine Nausea And Vomiting    Family History  Problem Relation Age of Onset   Breast cancer Mother    Heart disease Father    Heart disease Sister        pacemaker   Diabetes Sister     Prior to Admission medications   Medication Sig Start Date End Date Taking? Authorizing Provider  aspirin  81 MG chewable tablet Chew 1 tablet (81 mg total) by mouth daily. 07/04/24   Horton, Roxie CHRISTELLA, DO  chlorthalidone  (HYGROTON ) 25 MG tablet Take 1 tablet (25 mg total) by mouth daily. 06/23/24   Jerrell Cleatus Ned, MD  ibuprofen  (ADVIL ) 800 MG tablet Take 1 tablet (800 mg total) by mouth  every 8 (eight) hours as needed for headache. 04/03/24   [name], Katherine E, MD  ibuprofen  (ADVIL ) 800 MG tablet Take 1 tablet (800 mg total) by mouth every 8 (eight) hours as needed for headache. Patient not taking: Reported on 05/27/2024 04/01/24   [name], Katherine E, MD  Magnesium 250 MG TABS Take 250 mg by mouth as needed.    [provider]  Vitamin D , Ergocalciferol , (DRISDOL ) 1.25 MG (50000 UNIT) CAPS capsule Take 1 capsule (50,000 Units total) by mouth every 7 (seven) days. 02/15/24    Mahlon Comer BRAVO, MD    Physical Exam: Vitals:   07/04/24 2026 07/04/24 2130 07/04/24 2248 07/04/24 2300  BP: (!) 164/94 107/69  (!) 153/76  Pulse:  95  88  Resp: (!) 21 16    Temp:   98.7 F (37.1 C)   TempSrc:   Axillary   SpO2:  99%  100%  Weight:      Height:       Data Reviewed:  Results were reviewed and included above  Assessment and Plan: * Altered mental status Patient had MRI brain performed yesterday which was negative for acute ischemia The patient was thought to have possible infectious etiology perhaps contributing and CT chest abdomen pelvis was completed today which was negative for infectious etiology Repeat head CT was negative TSH,  B12 have been ordered Patient had ammonia level which was negative Given she had a subjective fever per daughter, will send nasal swab to rule out possible covid etiology  Will also consider possible LP for further evaluation Neurology has been consulted   Essential hypertension At this time patient's blood pressure is a little elevated at 156/91 Patient is normally on hydrochlorothiazide  at home  Given her low Na of 130  Will hold off on this medication  Will start her on hydralazine prn >220/120  GERD (gastroesophageal reflux disease) Will continue protonix  at this time      Advance Care Planning:   Code Status: Not on file   Consults:  Neurology   Family Communication: Daughter  Severity of Illness: The appropriate patient status for this patient is OBSERVATION. Observation status is judged to be reasonable and necessary in order to provide the required intensity of service to ensure the patient's safety. The patient's presenting symptoms, physical exam findings, and initial radiographic and laboratory data in the context of their medical condition is felt to place them at decreased risk for further clinical deterioration. Furthermore, it is anticipated that the patient will be medically stable for discharge from  the hospital within 2 midnights of admission.   Author: Bradly MARLA Drones, MD 07/04/2024 11:56 PM  For on call review [email or web address].

## 2024-07-04 NOTE — ED Provider Notes (Signed)
 [facility] AT [facility] Provider Note   CSN: [id number] Arrival date & time: 07/04/24  8196     Patient presents with: Altered Mental Status   Ann Conley is a 89 y.o. female.  Altered Mental Status Associated symptoms: agitation and headaches    Patient is an 89 year old female presenting ED today for concerns for altered mental status, accompanied by son today.  Was notably seen yesterday in the emergency department for TIA and left AMA despite neurology wishing her to be admitted for new TIA workup.  Presenting today with son saying that she has not returned to her baseline since yesterday.  Has been increasingly agitated despite being alert and oriented x 4.  Son does say that she has had urinary frequency but has not been complaining of pain otherwise. Notes that she does have right sided headache, similar to her previous migraines.  Denies new medications.  Denies fever, blurry vision, diplopia, vertigo, chest pain, shortness of breath, abdominal pain, nausea, vomiting, lower leg swelling.    Prior to Admission medications   Medication Sig Start Date End Date Taking? Authorizing Provider  aspirin  81 MG chewable tablet Chew 1 tablet (81 mg total) by mouth daily. 07/04/24   Horton, Roxie CHRISTELLA, DO  chlorthalidone  (HYGROTON ) 25 MG tablet Take 1 tablet (25 mg total) by mouth daily. 06/23/24   Jerrell Cleatus Ned, MD  ibuprofen  (ADVIL ) 800 MG tablet Take 1 tablet (800 mg total) by mouth every 8 (eight) hours as needed for headache. 04/03/24   [name], Katherine E, MD  ibuprofen  (ADVIL ) 800 MG tablet Take 1 tablet (800 mg total) by mouth every 8 (eight) hours as needed for headache. Patient not taking: Reported on 05/27/2024 04/01/24   [name], Katherine E, MD  Magnesium 250 MG TABS Take 250 mg by mouth as needed.    [provider]  Vitamin D , Ergocalciferol , (DRISDOL ) 1.25 MG (50000 UNIT) CAPS capsule Take 1 capsule (50,000 Units total) by  mouth every 7 (seven) days. 02/15/24   Mahlon Comer BRAVO, MD    Allergies: Codeine    Review of Systems  Neurological:  Positive for headaches.  Psychiatric/Behavioral:  Positive for agitation.   All other systems reviewed and are negative.   Updated Vital Signs BP (!) 153/76   Pulse 88   Temp 98.7 F (37.1 C) (Axillary)   Resp 16   Ht 4' 11 (1.499 m)   Wt 102.1 kg   SpO2 100%   BMI 45.44 kg/m   Physical Exam Vitals and nursing note reviewed.  Constitutional:      General: She is not in acute distress.    Appearance: Normal appearance. She is not ill-appearing or diaphoretic.  HENT:     Head: Normocephalic and atraumatic.  Eyes:     General: No scleral icterus.       Right eye: No discharge.        Left eye: No discharge.     Extraocular Movements: Extraocular movements intact.     Conjunctiva/sclera: Conjunctivae normal.     Pupils: Pupils are equal, round, and reactive to light.  Cardiovascular:     Rate and Rhythm: Normal rate and regular rhythm.     Pulses: Normal pulses.     Heart sounds: Normal heart sounds. No murmur heard.    No friction rub. No gallop.  Pulmonary:     Effort: Pulmonary effort is normal. No respiratory distress.     Breath sounds: No stridor. No wheezing,  rhonchi or rales.  Chest:     Chest wall: No tenderness.  Abdominal:     General: Abdomen is flat. There is no distension.     Palpations: Abdomen is soft.     Tenderness: There is no abdominal tenderness. There is no right CVA tenderness, left CVA tenderness, guarding or rebound.  Musculoskeletal:        General: No swelling, deformity or signs of injury.     Cervical back: Normal range of motion. No rigidity.     Right lower leg: No edema.     Left lower leg: No edema.  Skin:    General: Skin is warm and dry.     Findings: No bruising, erythema or lesion.  Neurological:     General: No focal deficit present.     Mental Status: She is alert and oriented to person, place, and  time.     Sensory: No sensory deficit.     Motor: No weakness.     Comments: No facial asymmetry, no ataxia, no apraxia, no aphasia, normal sensation to both upper and lower extremities bilaterally, normal grip strength bilaterally.  Moving all limbs grossly.  Psychiatric:        Mood and Affect: Mood normal.     (all labs ordered are listed, but only abnormal results are displayed) Labs Reviewed  COMPREHENSIVE METABOLIC PANEL WITH GFR - Abnormal; Notable for the following components:      Result Value   Sodium 131 (*)    Chloride 93 (*)    Glucose, Bld 129 (*)    All other components within normal limits  URINALYSIS, ROUTINE W REFLEX MICROSCOPIC - Abnormal; Notable for the following components:   APPearance HAZY (*)    Leukocytes,Ua TRACE (*)    Bacteria, UA RARE (*)    All other components within normal limits  CBG MONITORING, ED - Abnormal; Notable for the following components:   Glucose-Capillary 116 (*)    All other components within normal limits  CBC  AMMONIA  VITAMIN B12  TSH    EKG: None  Radiology: CT CHEST ABDOMEN PELVIS W CONTRAST Result Date: 07/04/2024 CLINICAL DATA:  Sepsis, altered mental status EXAM: CT CHEST, ABDOMEN, AND PELVIS WITH CONTRAST TECHNIQUE: Multidetector CT imaging of the chest, abdomen and pelvis was performed following the standard protocol during bolus administration of intravenous contrast. RADIATION DOSE REDUCTION: This exam was performed according to the departmental dose-optimization program which includes automated exposure control, adjustment of the mA and/or kV according to patient size and/or use of iterative reconstruction technique. CONTRAST:  75mL OMNIPAQUE  IOHEXOL  350 MG/ML SOLN COMPARISON:  CT abdomen pelvis 02/12/2006 FINDINGS: CT CHEST FINDINGS Cardiovascular: Extensive calcification of the mitral valve annulus. Extensive multi-vessel coronary artery calcification. Global cardiac size ismildly enlarged. No pericardial effusion.  Central pulmonary arteries are enlarged in keeping with changes of pulmonary arterial hypertension. Moderate atherosclerotic calcification within the thoracic aorta. No aortic aneurysm. Mediastinum/Nodes: No enlarged mediastinal, hilar, or axillary lymph nodes. Thyroid  gland, trachea, and esophagus demonstrate no significant findings. Small hiatal hernia Lungs/Pleura: Lungs are clear. No pleural effusion or pneumothorax. Musculoskeletal: No acute bone abnormality. No lytic or blastic bone lesion. Osseous structures are age appropriate. CT ABDOMEN PELVIS FINDINGS Hepatobiliary: No focal liver abnormality is seen. Status post cholecystectomy. Stable mild intra and extrahepatic biliary ductal dilation, likely representing post cholecystectomy change. Pancreas: There is mild dilation of the main pancreatic duct within the body and tail the pancreas with a focal point of transition best appreciated  within the mid body of the pancreas (58/3) which may relate to a underlying main duct papillary mucinous neoplasm or underlying main duct stenosis. A focal enhancing mass is not identified. The pancreatic duct is not dilated within the head of the pancreas. No peripancreatic inflammatory stranding or fluid collections are identified. Spleen: Unremarkable Adrenals/Urinary Tract: Adrenal glands are unremarkable. Mild respiratory motion artifact. Kidneys are normal, without renal calculi, focal lesion, or hydronephrosis. Bladder is unremarkable. Stomach/Bowel: Moderate sigmoid diverticulosis. Stomach, small bowel, and large bowel are otherwise unremarkable. Appendix normal. No evidence of obstruction or focal inflammation. No free intraperitoneal gas or fluid. Vascular/Lymphatic: Aortic atherosclerosis. No enlarged abdominal or pelvic lymph nodes. Reproductive: Status post hysterectomy. No adnexal masses. Residual ovaries are unremarkable. Other: Moderate fat containing umbilical and small bilateral fat containing inguinal  hernias. No abdominopelvic ascites Musculoskeletal: No acute bone abnormality. No lytic or blastic bone lesion. Osseous structures are age appropriate. IMPRESSION: 1. No acute intrathoracic or intra-abdominal pathology identified. 2. Extensive multi-vessel coronary artery calcification. Mild global cardiomegaly. 3. Morphologic changes in keeping with pulmonary arterial hypertension. 4. Mild dilation of the main pancreatic duct within the body and tail the pancreas with a focal point of transition best appreciated within the mid body of the pancreas which may relate to a underlying main duct papillary mucinous neoplasm or underlying main duct stenosis. A focal enhancing mass is not identified. 5. Moderate sigmoid diverticulosis without superimposed acute inflammatory change. 6. Moderate fat containing umbilical and small bilateral fat containing inguinal hernias. 7. Aortic atherosclerosis. Aortic Atherosclerosis (ICD10-I70.0). Electronically Signed   By: Dorethia Molt M.D.   On: 07/04/2024 21:33   CT Head Wo Contrast Result Date: 07/04/2024 EXAM: CT HEAD WITHOUT CONTRAST 07/04/2024 07:40:34 PM TECHNIQUE: CT of the head was performed without the administration of intravenous contrast. Automated exposure control, iterative reconstruction, and/or weight based adjustment of the mA/kV was utilized to reduce the radiation dose to as low as reasonably achievable. COMPARISON: CT head without contrast 01/27/2021. MRI head without contrast 07/03/2024. CLINICAL HISTORY: Mental status change, unknown cause. AMS. FINDINGS: BRAIN AND VENTRICLES: No acute hemorrhage. No evidence of acute infarct. No hydrocephalus. No extra-axial collection. No mass effect or midline shift. Mild atrophy and moderate diffuse white matter disease is again noted. A remote lacunar infarct is present in the right cerebellum. ORBITS: Bilateral lens replacements are noted. The globes and orbits are otherwise within normal limits. SINUSES: No acute  abnormality. SOFT TISSUES AND SKULL: No acute soft tissue abnormality. No skull fracture. IMPRESSION: 1. No acute intracranial abnormality. 2. Mild atrophy and moderate diffuse white matter disease. 3. Rule out lacunar infarct in the right cerebellum. Electronically signed by: Lonni Necessary MD 07/04/2024 07:54 PM EDT RP Workstation: [id number]   MR BRAIN WO CONTRAST Result Date: 07/03/2024 CLINICAL DATA:  Initial evaluation for acute neuro deficit, stroke suspected. EXAM: MRI HEAD WITHOUT CONTRAST MRA HEAD WITHOUT CONTRAST MRA NECK WITHOUT AND WITH CONTRAST TECHNIQUE: Multiplanar, multi-echo pulse sequences of the brain and surrounding structures were acquired without intravenous contrast. Angiographic images of the Circle of Willis were acquired using MRA technique without intravenous contrast. Angiographic images of the neck were acquired using MRA technique without and with intravenous contrast. Carotid stenosis measurements (when applicable) are obtained utilizing NASCET criteria, using the distal internal carotid diameter as the denominator. CONTRAST:  10mL GADAVIST  GADOBUTROL  1 MMOL/ML IV SOLN COMPARISON:  Comparison made with prior study from 02/13/2021. FINDINGS: MRI HEAD FINDINGS Brain: Cerebral volume within normal limits. Patchy T2/FLAIR hyperintensity involving the periventricular  deep white matter both cerebral hemispheres, consistent with chronic small vessel ischemic disease, moderate in nature. Small remote right cerebellar infarct noted (series 10, image 8). No evidence for acute or subacute infarct. No areas of chronic cortical infarction. No acute intracranial hemorrhage. Multiple scattered chronic micro hemorrhages noted, most pronounced about the cerebellum, likely hypertensive in nature. No mass lesion, midline shift or mass effect. No hydrocephalus or extra-axial fluid collection. Pituitary gland within normal limits. Vascular: Major intracranial vascular flow voids are maintained.  Skull and upper cervical spine: Craniocervical junction within normal limits. Bone marrow signal intensity overall within normal limits. Degenerative reactive endplate changes noted about the visualized C5-6 interspace. Hyperostosis frontalis interna noted. No scalp soft tissue abnormality. Sinuses/Orbits: Prior bilateral ocular lens replacement. Paranasal sinuses are largely clear. Trace left mastoid effusion noted, of doubtful significance. Other: None. MRA HEAD FINDINGS Anterior circulation: Both internal carotid arteries widely patent to the siphons to the termini without stenosis or other abnormality. A1 segments, anterior communicating artery complex common anterior cerebral arteries patent without stenosis. No M1 stenosis or occlusion. No proximal MCA branch occlusion. Distal MCA branches perfused and symmetric. Posterior circulation: Both V4 segments patent without stenosis. Left vertebral artery slightly dominant. Both PICA patent. Basilar patent without stenosis. Superior cerebral arteries patent bilaterally. Right PCA supplied via the basilar as well as a prominent right posterior communicating artery. Fetal type origin left PCA. Both PCAs patent to their distal aspects without stenosis. Anatomic variants: As above.  No aneurysm. MRA NECK FINDINGS Aortic arch: Examination moderately degraded by motion artifact. Visualized aortic arch within normal limits for caliber with standard branch pattern. No stenosis about the origin of the great vessels. Right carotid system: Right common and internal carotid arteries are patent with antegrade flow. No visible dissection. No hemodynamically significant stenosis about the right carotid artery system. Left carotid system: Left common and internal carotid arteries are patent with antegrade flow. No visible dissection. No hemodynamically significant stenosis about the left carotid artery system. Vertebral arteries: Neither vertebral artery origin visualized on this  exam. Visualized portions of vertebral arteries patent with antegrade flow. No stenosis or dissection. Other: None IMPRESSION: MRI HEAD: 1. No acute intracranial abnormality. 2. Moderate chronic microvascular ischemic disease. 3. Small remote right cerebellar infarct. MRA HEAD: Negative intracranial MRA for large vessel occlusion or other emergent finding. No hemodynamically significant or correctable stenosis. MRA NECK: 1. Negative MRA of the neck for large vessel occlusion or other emergent finding. No hemodynamically significant stenosis about either carotid artery system. 2. Wide patency of both vertebral arteries within the neck. Electronically Signed   By: Morene Hoard M.D.   On: 07/03/2024 23:06   MR ANGIO HEAD WO CONTRAST Result Date: 07/03/2024 CLINICAL DATA:  Initial evaluation for acute neuro deficit, stroke suspected. EXAM: MRI HEAD WITHOUT CONTRAST MRA HEAD WITHOUT CONTRAST MRA NECK WITHOUT AND WITH CONTRAST TECHNIQUE: Multiplanar, multi-echo pulse sequences of the brain and surrounding structures were acquired without intravenous contrast. Angiographic images of the Circle of Willis were acquired using MRA technique without intravenous contrast. Angiographic images of the neck were acquired using MRA technique without and with intravenous contrast. Carotid stenosis measurements (when applicable) are obtained utilizing NASCET criteria, using the distal internal carotid diameter as the denominator. CONTRAST:  10mL GADAVIST  GADOBUTROL  1 MMOL/ML IV SOLN COMPARISON:  Comparison made with prior study from 02/13/2021. FINDINGS: MRI HEAD FINDINGS Brain: Cerebral volume within normal limits. Patchy T2/FLAIR hyperintensity involving the periventricular deep white matter both cerebral hemispheres, consistent with chronic  small vessel ischemic disease, moderate in nature. Small remote right cerebellar infarct noted (series 10, image 8). No evidence for acute or subacute infarct. No areas of chronic  cortical infarction. No acute intracranial hemorrhage. Multiple scattered chronic micro hemorrhages noted, most pronounced about the cerebellum, likely hypertensive in nature. No mass lesion, midline shift or mass effect. No hydrocephalus or extra-axial fluid collection. Pituitary gland within normal limits. Vascular: Major intracranial vascular flow voids are maintained. Skull and upper cervical spine: Craniocervical junction within normal limits. Bone marrow signal intensity overall within normal limits. Degenerative reactive endplate changes noted about the visualized C5-6 interspace. Hyperostosis frontalis interna noted. No scalp soft tissue abnormality. Sinuses/Orbits: Prior bilateral ocular lens replacement. Paranasal sinuses are largely clear. Trace left mastoid effusion noted, of doubtful significance. Other: None. MRA HEAD FINDINGS Anterior circulation: Both internal carotid arteries widely patent to the siphons to the termini without stenosis or other abnormality. A1 segments, anterior communicating artery complex common anterior cerebral arteries patent without stenosis. No M1 stenosis or occlusion. No proximal MCA branch occlusion. Distal MCA branches perfused and symmetric. Posterior circulation: Both V4 segments patent without stenosis. Left vertebral artery slightly dominant. Both PICA patent. Basilar patent without stenosis. Superior cerebral arteries patent bilaterally. Right PCA supplied via the basilar as well as a prominent right posterior communicating artery. Fetal type origin left PCA. Both PCAs patent to their distal aspects without stenosis. Anatomic variants: As above.  No aneurysm. MRA NECK FINDINGS Aortic arch: Examination moderately degraded by motion artifact. Visualized aortic arch within normal limits for caliber with standard branch pattern. No stenosis about the origin of the great vessels. Right carotid system: Right common and internal carotid arteries are patent with antegrade  flow. No visible dissection. No hemodynamically significant stenosis about the right carotid artery system. Left carotid system: Left common and internal carotid arteries are patent with antegrade flow. No visible dissection. No hemodynamically significant stenosis about the left carotid artery system. Vertebral arteries: Neither vertebral artery origin visualized on this exam. Visualized portions of vertebral arteries patent with antegrade flow. No stenosis or dissection. Other: None IMPRESSION: MRI HEAD: 1. No acute intracranial abnormality. 2. Moderate chronic microvascular ischemic disease. 3. Small remote right cerebellar infarct. MRA HEAD: Negative intracranial MRA for large vessel occlusion or other emergent finding. No hemodynamically significant or correctable stenosis. MRA NECK: 1. Negative MRA of the neck for large vessel occlusion or other emergent finding. No hemodynamically significant stenosis about either carotid artery system. 2. Wide patency of both vertebral arteries within the neck. Electronically Signed   By: Morene Hoard M.D.   On: 07/03/2024 23:06   MR Angiogram Neck W or Wo Contrast Result Date: 07/03/2024 CLINICAL DATA:  Initial evaluation for acute neuro deficit, stroke suspected. EXAM: MRI HEAD WITHOUT CONTRAST MRA HEAD WITHOUT CONTRAST MRA NECK WITHOUT AND WITH CONTRAST TECHNIQUE: Multiplanar, multi-echo pulse sequences of the brain and surrounding structures were acquired without intravenous contrast. Angiographic images of the Circle of Willis were acquired using MRA technique without intravenous contrast. Angiographic images of the neck were acquired using MRA technique without and with intravenous contrast. Carotid stenosis measurements (when applicable) are obtained utilizing NASCET criteria, using the distal internal carotid diameter as the denominator. CONTRAST:  10mL GADAVIST  GADOBUTROL  1 MMOL/ML IV SOLN COMPARISON:  Comparison made with prior study from 02/13/2021.  FINDINGS: MRI HEAD FINDINGS Brain: Cerebral volume within normal limits. Patchy T2/FLAIR hyperintensity involving the periventricular deep white matter both cerebral hemispheres, consistent with chronic small vessel ischemic disease, moderate in  nature. Small remote right cerebellar infarct noted (series 10, image 8). No evidence for acute or subacute infarct. No areas of chronic cortical infarction. No acute intracranial hemorrhage. Multiple scattered chronic micro hemorrhages noted, most pronounced about the cerebellum, likely hypertensive in nature. No mass lesion, midline shift or mass effect. No hydrocephalus or extra-axial fluid collection. Pituitary gland within normal limits. Vascular: Major intracranial vascular flow voids are maintained. Skull and upper cervical spine: Craniocervical junction within normal limits. Bone marrow signal intensity overall within normal limits. Degenerative reactive endplate changes noted about the visualized C5-6 interspace. Hyperostosis frontalis interna noted. No scalp soft tissue abnormality. Sinuses/Orbits: Prior bilateral ocular lens replacement. Paranasal sinuses are largely clear. Trace left mastoid effusion noted, of doubtful significance. Other: None. MRA HEAD FINDINGS Anterior circulation: Both internal carotid arteries widely patent to the siphons to the termini without stenosis or other abnormality. A1 segments, anterior communicating artery complex common anterior cerebral arteries patent without stenosis. No M1 stenosis or occlusion. No proximal MCA branch occlusion. Distal MCA branches perfused and symmetric. Posterior circulation: Both V4 segments patent without stenosis. Left vertebral artery slightly dominant. Both PICA patent. Basilar patent without stenosis. Superior cerebral arteries patent bilaterally. Right PCA supplied via the basilar as well as a prominent right posterior communicating artery. Fetal type origin left PCA. Both PCAs patent to their distal  aspects without stenosis. Anatomic variants: As above.  No aneurysm. MRA NECK FINDINGS Aortic arch: Examination moderately degraded by motion artifact. Visualized aortic arch within normal limits for caliber with standard branch pattern. No stenosis about the origin of the great vessels. Right carotid system: Right common and internal carotid arteries are patent with antegrade flow. No visible dissection. No hemodynamically significant stenosis about the right carotid artery system. Left carotid system: Left common and internal carotid arteries are patent with antegrade flow. No visible dissection. No hemodynamically significant stenosis about the left carotid artery system. Vertebral arteries: Neither vertebral artery origin visualized on this exam. Visualized portions of vertebral arteries patent with antegrade flow. No stenosis or dissection. Other: None IMPRESSION: MRI HEAD: 1. No acute intracranial abnormality. 2. Moderate chronic microvascular ischemic disease. 3. Small remote right cerebellar infarct. MRA HEAD: Negative intracranial MRA for large vessel occlusion or other emergent finding. No hemodynamically significant or correctable stenosis. MRA NECK: 1. Negative MRA of the neck for large vessel occlusion or other emergent finding. No hemodynamically significant stenosis about either carotid artery system. 2. Wide patency of both vertebral arteries within the neck. Electronically Signed   By: Morene Hoard M.D.   On: 07/03/2024 23:06     Procedures   Medications Ordered in the ED  lactated ringers  bolus 500 mL (0 mLs Intravenous Stopped 07/04/24 2134)  iohexol  (OMNIPAQUE ) 350 MG/ML injection 75 mL (75 mLs Intravenous Contrast Given 07/04/24 2120)  LORazepam  (ATIVAN ) injection 1 mg (1 mg Intravenous Given 07/04/24 2229)    Clinical Course as of 07/04/24 2346  Fri Jul 04, 2024  2107 Spoke with Dr. Khaliqdina with neurology who do not believe that she necessarily warranted further TIA or  neurologic workup at this time.  Does not have any further recommendations for this patient, we will seek to try and see the patient later on this evening. [CB]    Clinical Course User Index [CB] Ann Terrall RAMAN, PA-C                               Medical Decision Making Amount  and/or Complexity of Data Reviewed Labs: ordered. Radiology: ordered.  Risk Prescription drug management. Decision regarding hospitalization.   This patient is a 89 year old female  who presents to the ED for concern of delirium, seen yesterday and was discharged AMA for TIA having MRIs done at that time.  Coming today with progressed confusion and agitation per family, with son at bedside who says that she has been talking abnormally with intermittent bouts of normality in her behavior.  But progressively has gotten worse with her last known normal being yesterday.No new medication changes outside of switching from amlodipine  to her chlorthalidone .  On physical exam, patient is in no acute distress, afebrile, alert and orient x 3 speaking in full sentences, nontachypneic, nontachycardic.  Patient was noted to be intermittently able to follow instructions.  Was moving all limbs grossly and had normal grip strength bilaterally.  PERRL.  LCTAB, RRR, no murmur, no abdominal tenderness to palpation.  However became progressively more agitated.  Attending also saw patient, not believing this to be neurological and believed to be encephalopathic.  Wished to have CT and baseline labs within morning done. Also wishing to have CT chest, abdomen, pelvis.  Lab work was done did not show any acute abnormalities or causes for delirium today CT chest abdomen pelvis as well as CT head did not show any acute findings for cause of today's delirium.  Spoke with neurology and they to do not believe that it warranted any further evaluation from their end.  Said that they would try to round on them by tonight but otherwise no further  indication from them.  Patient was admitted to [facility], with patient care transferred at that time.   Differential diagnoses prior to evaluation: The emergent differential diagnosis includes, but is not limited to, encephalopathy, CVA, secondary drug intoxication, substance abuse, metabolic disturbance, sepsis, dementia,. This is not an exhaustive differential.   Past Medical History / Co-morbidities / Social History: Asthma, HLD, HTN, migraines, TIA, GERD  Additional history: Chart reviewed. Pertinent results include: Seen yesterday and left AMA for TIA, noted to have a small cerebellar infarct on MRI but otherwise no acute abnormality.  Was noted to be alert and oriented at that time and behaving normally.  Lab Tests/Imaging studies: I personally interpreted labs/imaging and the pertinent results include:  . CBC unremarkable CMP shows a mild hyponatremia UA unremarkable Ammonia unremarkable  CT head shows No acute intracranial malady.  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 CT chest abdomen pelvis shows no acute abnormality.  With some incidental findings.  I agree with the radiologist interpretation.  Cardiac monitoring: EKG obtained and interpreted by myself and attending physician which shows: Sinus rhythm with borderline prolonged PR interval   Medications: I ordered medication including Ativan .  I have reviewed the patients home medicines and have made adjustments as needed.  Critical Interventions: None  Social Determinants of Health: Has family at bedside  Disposition: After consideration of the diagnostic results and the patients response to treatment, I feel that the patient would benefit from admission, patient care transferred over to [facility].   Final diagnoses:  Delirium    ED Discharge Orders     None          Ann Conley 07/04/24 2350    Armenta Canning, MD 07/07/24 [phone]

## 2024-07-05 ENCOUNTER — Inpatient Hospital Stay (HOSPITAL_COMMUNITY)

## 2024-07-05 DIAGNOSIS — R569 Unspecified convulsions: Secondary | ICD-10-CM | POA: Diagnosis not present

## 2024-07-05 DIAGNOSIS — G459 Transient cerebral ischemic attack, unspecified: Secondary | ICD-10-CM | POA: Diagnosis not present

## 2024-07-05 DIAGNOSIS — E66813 Obesity, class 3: Secondary | ICD-10-CM | POA: Diagnosis not present

## 2024-07-05 DIAGNOSIS — R410 Disorientation, unspecified: Secondary | ICD-10-CM

## 2024-07-05 DIAGNOSIS — K219 Gastro-esophageal reflux disease without esophagitis: Secondary | ICD-10-CM | POA: Diagnosis not present

## 2024-07-05 DIAGNOSIS — E871 Hypo-osmolality and hyponatremia: Secondary | ICD-10-CM

## 2024-07-05 DIAGNOSIS — G9341 Metabolic encephalopathy: Secondary | ICD-10-CM | POA: Diagnosis present

## 2024-07-05 DIAGNOSIS — I10 Essential (primary) hypertension: Secondary | ICD-10-CM | POA: Diagnosis not present

## 2024-07-05 DIAGNOSIS — R4182 Altered mental status, unspecified: Secondary | ICD-10-CM | POA: Diagnosis present

## 2024-07-05 LAB — RESPIRATORY PANEL BY PCR

## 2024-07-05 LAB — BLOOD GAS, VENOUS
Acid-Base Excess: 3.9 mmol/L — ABNORMAL HIGH (ref 0.0–2.0)
Bicarbonate: 30.2 mmol/L — ABNORMAL HIGH (ref 20.0–28.0)
O2 Saturation: 59.8 %
Patient temperature: 37
pCO2, Ven: 51 mmHg (ref 44–60)
pH, Ven: 7.38 (ref 7.25–7.43)
pO2, Ven: 33 mmHg (ref 32–45)

## 2024-07-05 LAB — HEMOGLOBIN A1C
Hgb A1c MFr Bld: 5.5 % (ref 4.8–5.6)
Mean Plasma Glucose: 111.15 mg/dL

## 2024-07-05 LAB — SARS CORONAVIRUS 2 BY RT PCR: SARS Coronavirus 2 by RT PCR: NEGATIVE

## 2024-07-05 LAB — TSH: TSH: 1.499 u[IU]/mL (ref 0.350–4.500)

## 2024-07-05 LAB — VITAMIN B12: Vitamin B-12: 1151 pg/mL — ABNORMAL HIGH (ref 180–914)

## 2024-07-05 MED ORDER — PANTOPRAZOLE SODIUM 40 MG IV SOLR
40.0000 mg | Freq: Two times a day (BID) | INTRAVENOUS | Status: DC
  Administered 2024-07-05 – 2024-07-06 (×4): 40 mg via INTRAVENOUS
  Filled 2024-07-05 (×4): qty 10

## 2024-07-05 MED ORDER — ENOXAPARIN SODIUM 40 MG/0.4ML IJ SOSY
40.0000 mg | PREFILLED_SYRINGE | INTRAMUSCULAR | Status: DC
  Administered 2024-07-05: 40 mg via SUBCUTANEOUS
  Filled 2024-07-05: qty 0.4

## 2024-07-05 NOTE — Assessment & Plan Note (Signed)
 Patient had MRI brain performed yesterday which was negative for acute ischemia The patient was thought to have possible infectious etiology perhaps contributing and CT chest abdomen pelvis was completed today which was negative for infectious etiology Repeat head CT was negative TSH,  B12 have been ordered Patient had ammonia level which was negative Given she had a subjective fever per daughter, will send nasal swab to rule out possible covid etiology  Will also consider possible LP for further evaluation Neurology has been consulted

## 2024-07-05 NOTE — Evaluation (Signed)
 Physical Therapy Evaluation Patient Details Name: Ann Conley MRN: [id number] DOB: 04/09/1934 Today's Date: 07/05/2024  History of Present Illness  89 y.o. female presents to [facility] hospital on 07/04/2024 with AMS. Pt recently presented on 8/28 with concern for aphasia, MRI negative for acute changes. PMH includes HTN, migraine, GERD, breast CA, OA.  Clinical Impression  Pt presents to PT with deficits in cognition, safety awareness, strength, power, gait, balance. Pt is able to ambulate for household distances with support of RW, PT provides cues to improve transfer safety. Pt does continue to demonstrate slowed processing at this time. HHPT is recommended at the time of discharge.        If plan is discharge home, recommend the following: A little help with walking and/or transfers;A little help with bathing/dressing/bathroom;Assistance with cooking/housework;Direct supervision/assist for financial management;Direct supervision/assist for medications management;Help with stairs or ramp for entrance;Assist for transportation   Can travel by private vehicle        Equipment Recommendations Rolling walker (2 wheels);BSC/3in1  Recommendations for Other Services       Functional Status Assessment Patient has had a recent decline in their functional status and demonstrates the ability to make significant improvements in function in a reasonable and predictable amount of time.     Precautions / Restrictions Precautions Precautions: Fall Recall of Precautions/Restrictions: Impaired Restrictions Weight Bearing Restrictions Per Provider Order: No      Mobility  Bed Mobility Overal bed mobility: Needs Assistance Bed Mobility: Supine to Sit     Supine to sit: Min assist, HOB elevated          Transfers Overall transfer level: Needs assistance Equipment used: Rolling walker (2 wheels) Transfers: Sit to/from Stand Sit to Stand: Contact guard assist                 Ambulation/Gait Ambulation/Gait assistance: Min assist (one small loss of balance when lifting walker to clear an obstacle) Gait Distance (Feet): 50 Feet Assistive device: Rolling walker (2 wheels) Gait Pattern/deviations: Step-through pattern Gait velocity: reduced Gait velocity interpretation: <1.8 ft/sec, indicate of risk for recurrent falls   General Gait Details: slowed step-through gait, pt requiring cues to turn all the way around with RW when approachign surfaces she wants to sit on rather than abandoning RW and then pivoting to transfer  Stairs            Wheelchair Mobility     Tilt Bed    Modified Rankin (Stroke Patients Only)       Balance Overall balance assessment: Needs assistance Sitting-balance support: No upper extremity supported, Feet supported Sitting balance-Leahy Scale: Fair     Standing balance support: Single extremity supported, Reliant on assistive device for balance Standing balance-Leahy Scale: Poor                               Pertinent Vitals/Pain Pain Assessment Pain Assessment: No/denies pain    Home Living Family/patient expects to be discharged to:: Private residence Living Arrangements: Spouse/significant other (spouse has dementia) Available Help at Discharge: Family;Available 24 hours/day (initially family available) Type of Home: Other(Comment) (Townhome) Home Access: Stairs to enter Entrance Stairs-Rails: None Entrance Stairs-Number of Steps: 1   Home Layout: Two level;Able to live on main level with bedroom/bathroom Home Equipment: Rolling Walker (2 wheels);Rollator (4 wheels);[place] - single point (spouse utilizes the walkers, pt utilizes [facility] PRN)      Prior Function Prior Level of Function :  Independent/Modified Independent             Mobility Comments: ambulatory with PRN use of SPC       Extremity/Trunk Assessment   Upper Extremity Assessment Upper Extremity Assessment: Overall WFL for  tasks assessed    Lower Extremity Assessment Lower Extremity Assessment: Generalized weakness    Cervical / Trunk Assessment Cervical / Trunk Assessment: Kyphotic  Communication   Communication Communication: No apparent difficulties    Cognition Arousal: Alert Behavior During Therapy: WFL for tasks assessed/performed   PT - Cognitive impairments: Awareness, Sequencing, Problem solving, Safety/Judgement                         Following commands: Impaired Following commands impaired: Follows multi-step commands inconsistently     Cueing Cueing Techniques: Verbal cues     General Comments General comments (skin integrity, edema, etc.): SpO2 at 91% on RA when completing mobility    Exercises     Assessment/Plan    PT Assessment Patient needs continued PT services  PT Problem List Decreased strength;Decreased activity tolerance;Decreased balance;Decreased mobility;Decreased cognition;Decreased knowledge of use of DME;Decreased safety awareness;Decreased knowledge of precautions;Cardiopulmonary status limiting activity       PT Treatment Interventions DME instruction;Gait training;Stair training;Functional mobility training;Therapeutic activities;Therapeutic exercise;Balance training;Neuromuscular re-education;Cognitive remediation;Patient/family education    PT Goals (Current goals can be found in the Care Plan section)  Acute Rehab PT Goals Patient Stated Goal: to return to independence PT Goal Formulation: With patient/family Time For Goal Achievement: 07/19/24 Potential to Achieve Goals: Fair    Frequency Min 2X/week     Co-evaluation               AM-PAC PT 6 Clicks Mobility  Outcome Measure Help needed turning from your back to your side while in a flat bed without using bedrails?: A Little Help needed moving from lying on your back to sitting on the side of a flat bed without using bedrails?: A Little Help needed moving to and from a bed  to a chair (including a wheelchair)?: A Little Help needed standing up from a chair using your arms (e.g., wheelchair or bedside chair)?: A Little Help needed to walk in hospital room?: A Little Help needed climbing 3-5 steps with a railing? : A Lot 6 Click Score: 17    End of Session Equipment Utilized During Treatment: Gait belt Activity Tolerance: Patient tolerated treatment well Patient left: with call bell/phone within reach;in chair;with chair alarm set;with family/visitor present Nurse Communication: Mobility status PT Visit Diagnosis: Other abnormalities of gait and mobility (R26.89);Muscle weakness (generalized) (M62.81)    Time: 8866-8785 PT Time Calculation (min) (ACUTE ONLY): 41 min   Charges:   PT Evaluation $PT Eval Low Complexity: 1 Low PT Treatments $Therapeutic Activity: 8-22 mins PT General Charges $$ ACUTE PT VISIT: 1 Visit         [name], PT, DPT Acute Rehabilitation Office [phone]   [name] 07/05/2024, 12:29 PM

## 2024-07-05 NOTE — ED Notes (Signed)
 Patient transported to MRI

## 2024-07-05 NOTE — ED Notes (Signed)
 Assumed care of pt at this time. Pt is asleep and comfortable, although appears to fidget and move bilateral arms at times. Soft restraints to bilateral wrists remain in place

## 2024-07-05 NOTE — Evaluation (Signed)
 Occupational Therapy Evaluation Patient Details Name: Ann Conley MRN: [id number] DOB: May 18, 1934 Today's Date: 07/05/2024   History of Present Illness   89 y.o. female presents to [facility] hospital on 07/04/2024 with AMS. Pt recently presented on 8/28 with concern for aphasia, MRI negative for acute changes. PMH includes HTN, migraine, GERD, breast CA, OA.     Clinical Impressions PTA, pt lived at home with her spouse for whom she serves as the primary caregiver providing cognitive assist with ADL and IADL. Pt presents with decreased safety, balance, cognition, memory, and problem solving. Pt needing mod-max cues for safe use of RW during transfers and transition from one standing task to next. Pt able to follow one step commands, and needs mod VC for 2 step commands. Pt becoming emotional during session with onset fatigue and talking about missing grand daughter wedding today so further cognition not formally assessed. Daughter expresses predominant concern with decreased memory, prior disorientation (seems to be cleared up), and decr problem solving. Pt to continue to benefit from acute OT services. Recommending discharge home with HHOT and support from family.      If plan is discharge home, recommend the following:   A little help with walking and/or transfers;A little help with bathing/dressing/bathroom;Assistance with cooking/housework;Help with stairs or ramp for entrance;Direct supervision/assist for financial management;Direct supervision/assist for medications management;Assist for transportation (encouraged daughter present to assist with medication management and supervise cooking/financial management closely)     Functional Status Assessment   Patient has had a recent decline in their functional status and demonstrates the ability to make significant improvements in function in a reasonable and predictable amount of time.     Equipment Recommendations   None recommended by  OT     Recommendations for Other Services   Speech consult     Precautions/Restrictions   Precautions Precautions: Fall Recall of Precautions/Restrictions: Impaired Restrictions Weight Bearing Restrictions Per Provider Order: No     Mobility Bed Mobility Overal bed mobility: Needs Assistance Bed Mobility: Sit to Supine       Sit to supine: Min assist   General bed mobility comments: pt crawling into bed face first with initiation of L knee and bil hands on bed; once here, needing mod cues to safely finist rolling into bed    Transfers Overall transfer level: Needs assistance Equipment used: Rolling walker (2 wheels) Transfers: Sit to/from Stand Sit to Stand: Min assist           General transfer comment: cues for safety with hand placement      Balance Overall balance assessment: Needs assistance Sitting-balance support: No upper extremity supported, Feet supported Sitting balance-Leahy Scale: Fair     Standing balance support: Single extremity supported, Reliant on assistive device for balance Standing balance-Leahy Scale: Poor                             ADL either performed or assessed with clinical judgement   ADL Overall ADL's : Needs assistance/impaired Eating/Feeding: Set up;Sitting   Grooming: Contact guard assist;Oral care;Wash/dry hands;Standing   Upper Body Bathing: Set up;Sitting   Lower Body Bathing: Minimal assistance;Sit to/from stand   Upper Body Dressing : Set up;Sitting   Lower Body Dressing: Sit to/from stand;Moderate assistance   Toilet Transfer: Minimal assistance;Ambulation;Rolling walker (2 wheels);Regular Toilet;Grab bars;Cueing for safety   Toileting- Clothing Manipulation and Hygiene: Minimal assistance;Sitting/lateral lean;Sit to/from stand       Functional mobility during ADLs:  Contact guard assist;Rolling walker (2 wheels);Minimal assistance (intermittent min A for safety with RW management)        Vision Patient Visual Report: No change from baseline Vision Assessment?: No apparent visual deficits     Perception         Praxis         Pertinent Vitals/Pain Pain Assessment Pain Assessment: No/denies pain     Extremity/Trunk Assessment Upper Extremity Assessment Upper Extremity Assessment: Generalized weakness   Lower Extremity Assessment Lower Extremity Assessment: Defer to PT evaluation   Cervical / Trunk Assessment Cervical / Trunk Assessment: Kyphotic   Communication Communication Communication: No apparent difficulties   Cognition Arousal: Alert Behavior During Therapy: WFL for tasks assessed/performed Cognition: Cognition impaired   Orientation impairments: Situation (inconsistently oriented to full situation) Awareness: Intellectual awareness impaired, Online awareness impaired (emerging intellectual awareness) Memory impairment (select all impairments): Short-term memory, Working [employer] fast streamer, Conservation officer, historic buildings Attention impairment (select first level of impairment): Sustained attention, Selective attention Executive functioning impairment (select all impairments): Organization, Sequencing, Problem solving, Reasoning OT - Cognition Comments: pt follows one step commands, poor insight into current functional abilities physically and cognitively. Pt needing mod cues to follow 2 step commands, and max cues during transfers and transition from task to task for safe RW management despite education                 Following commands: Impaired Following commands impaired: Follows multi-step commands inconsistently     Cueing  General Comments   Cueing Techniques: Verbal cues  SpO2 at 91% on RA when completing mobility   Exercises     Shoulder Instructions      Home Living Family/patient expects to be discharged to:: Private residence Living Arrangements: Spouse/significant other (spouse has dementia) Available Help at Discharge:  Family;Available 24 hours/day (initially family available) Type of Home: Other(Comment) (Townhome) Home Access: Stairs to enter [employer] of Steps: 1 Entrance Stairs-Rails: None Home Layout: Two level;Able to live on main level with bedroom/bathroom               Home Equipment: Rolling Walker (2 wheels);Rollator (4 wheels);Cane - single point (spouse utilizes the walkers, pt utilizes SPC PRN)          Prior Functioning/Environment Prior Level of Function : Independent/Modified Independent             Mobility Comments: ambulatory with PRN use of SPC ADLs Comments: independent in ADL, IADL, driving, assisting husband with IADL and BADL    OT Problem List: Decreased strength;Decreased activity tolerance;Impaired balance (sitting and/or standing);Decreased cognition;Decreased safety awareness   OT Treatment/Interventions: Self-care/ADL training;Therapeutic exercise;DME and/or AE instruction;Therapeutic activities;Cognitive remediation/compensation;Patient/family education;Balance training      OT Goals(Current goals can be found in the care plan section)   Acute Rehab OT Goals Patient Stated Goal: get better OT Goal Formulation: With patient Time For Goal Achievement: 07/19/24 Potential to Achieve Goals: Good   OT Frequency:  Min 2X/week    Co-evaluation              AM-PAC OT 6 Clicks Daily Activity     Outcome Measure Help from another person eating meals?: None Help from another person taking care of personal grooming?: A Little Help from another person toileting, which includes using toliet, bedpan, or urinal?: A Little Help from another person bathing (including washing, rinsing, drying)?: A Little Help from another person to put on and taking off regular upper body clothing?: A Little Help from another person  to put on and taking off regular lower body clothing?: A Little 6 Click Score: 19   End of Session Equipment Utilized During  Treatment: Gait belt;Rolling walker (2 wheels) Nurse Communication: Mobility status;Other (comment) (purewick removed to ambulate to restroom, not replaced at pt request also OT not recommending purewick as pt can walk to restroom with assist)  Activity Tolerance: Patient tolerated treatment well Patient left: in bed;with call bell/phone within reach;with bed alarm set;with family/visitor present  OT Visit Diagnosis: Unsteadiness on feet (R26.81);Muscle weakness (generalized) (M62.81);Cognitive communication deficit (R41.841)                Time: 8588-8495 OT Time Calculation (min): 53 min Charges:  OT General Charges $OT Visit: 1 Visit OT Evaluation $OT Eval Low Complexity: 1 Low OT Treatments $Self Care/Home Management : 38-52 mins  Elma JONETTA Lebron FREDERICK, OTR/L [facility] Acute Rehabilitation Office: [phone]   Elma JONETTA Lebron 07/05/2024, 3:29 PM

## 2024-07-05 NOTE — Procedures (Signed)
 Patient Name: Ann Conley  MRN: [id number]  Epilepsy Attending: Arlin MALVA Krebs  Referring Physician/Provider: Khaliqdina, Salman, MD  Date: 07/05/2024 Duration: 26.02 mins  Patient history: 89 y.o. female with aphasia and left-sided weakness that was transient and lasted about 10 minutes and resolved. EEG to evaluate for seizure  Level of alertness: Awake, asleep  AEDs during EEG study: None  Technical aspects: This EEG study was done with scalp electrodes positioned according to the 10-20 International system of electrode placement. Electrical activity was reviewed with band pass filter of 1-70Hz , sensitivity of 7 uV/mm, display speed of 74mm/sec with a 60Hz  notched filter applied as appropriate. EEG data were recorded continuously and digitally stored.  Video monitoring was available and reviewed as appropriate.  Description: The posterior dominant rhythm consists of 8 Hz activity of moderate voltage (25-35 uV) seen predominantly in posterior head regions, symmetric and reactive to eye opening and eye closing. Sleep was characterized by vertex waves, sleep spindles (12 to 14 Hz), maximal frontocentral region. EEG showed intermittent generalized 3 to 6 Hz theta-delta slowing. Hyperventilation and photic stimulation were not performed.     ABNORMALITY - Intermittent slow, generalized  IMPRESSION: This study is suggestive of mild diffuse encephalopathy. No seizures or epileptiform discharges were seen throughout the recording.  [name] O Kiree Dejarnette

## 2024-07-05 NOTE — ED Notes (Signed)
 Pt being evaluated by neurology at the bedside currently.

## 2024-07-05 NOTE — Consult Note (Signed)
 NEUROLOGY CONSULT NOTE   Date of service: July 05, 2024 Patient Name: Ann Conley MRN:  [id number] DOB:  [birth year] Chief Complaint: Aphasia and now confused Requesting Provider: Darci Pore, MD  History of Present Illness  Ann Conley is a 89 y.o. female with hx of anemia, history of migraine with visual aura, hypertension, TIA who initially presented to the ED on 8/28 with aphasia and left-sided weakness that was transient and lasted about 10 minutes and resolved.  She came into the ED and had workup with MRI of the brain and MRA of the head and neck which was negative for acute intracranial abnormalities and no LVO.  She was recommended admission for TIA workup.  However, patient declined and left AMA.  She presents to the ED today with agitation/confusion and is progressively gotten worse. Patient had received some Ativan  last night and on my evaluation this morning, she is somnolent but awakens briefly to follow commands and answer questions.  She is unable to provide detailed history as she keeps falling asleep.  She had evaluation for UTI and pneumonia with a UA and a CT of the chest abdomen pelvis which did not demonstrate a pneumonia.  She has mild dilation of the main pancreatic duct.  Her CBC and chemistry is nonrevealing.  TSH, ammonia and B12 are normal.  Neurology was consulted for further evaluation and workup.   ROS  Unable to ascertain due to drowsiness.  Past History   Past Medical History:  Diagnosis Date   Anemia    Blood transfusion complicating pregnancy 1961   GERD (gastroesophageal reflux disease)    History of chicken pox    Hypertension    Migraine    occular   TIA (transient ischemic attack)     Past Surgical History:  Procedure Laterality Date   BREAST BIOPSY     CHOLECYSTECTOMY     PARTIAL HYSTERECTOMY      Family History: Family History  Problem Relation Age of Onset   Breast cancer Mother    Heart disease Father     Heart disease Sister        pacemaker   Diabetes Sister     Social History  reports that she has never smoked. She has never used smokeless tobacco. She reports current alcohol use. She reports that she does not use drugs.  Allergies  Allergen Reactions   Codeine Nausea And Vomiting    Medications   Current Facility-Administered Medications:     stroke: early stages of recovery book, , Does not apply, Once, Ann Conley, Ann K, MD   pantoprazole  (PROTONIX ) injection 40 mg, 40 mg, Intravenous, Q12H, Ann Conley, Ann K, MD, 40 mg at 07/05/24 0100  Current Outpatient Medications:    aspirin  81 MG chewable tablet, Chew 1 tablet (81 mg total) by mouth daily., Disp: 30 tablet, Rfl: 0   chlorthalidone  (HYGROTON ) 25 MG tablet, Take 1 tablet (25 mg total) by mouth daily., Disp: 90 tablet, Rfl: 1   ibuprofen  (ADVIL ) 800 MG tablet, Take 1 tablet (800 mg total) by mouth every 8 (eight) hours as needed for headache., Disp: 90 tablet, Rfl: 1   ibuprofen  (ADVIL ) 800 MG tablet, Take 1 tablet (800 mg total) by mouth every 8 (eight) hours as needed for headache. (Patient not taking: Reported on 05/27/2024), Disp: 90 tablet, Rfl: 1   Magnesium 250 MG TABS, Take 250 mg by mouth as needed., Disp: , Rfl:    Vitamin D , Ergocalciferol , (DRISDOL ) 1.25 MG (50000 UNIT) CAPS  capsule, Take 1 capsule (50,000 Units total) by mouth every 7 (seven) days., Disp: 12 capsule, Rfl: 0  Vitals   Vitals:   07/05/24 0300 07/05/24 0540 07/05/24 0600 07/05/24 0630  BP: (!) 140/80 (!) 113/53 123/64 (!) 142/71  Pulse: 81 68 65 69  Resp: 20 17 18  (!) 26  Temp:  (!) 96.6 F (35.9 C)    TempSrc:  Axillary    SpO2: 100% 100% 100% 100%  Weight:      Height:        Body mass index is 45.44 kg/m.   Physical Exam   General: Laying comfortably in bed; in no acute distress.  HENT: Normal oropharynx and mucosa. Normal external appearance of ears and nose.  Neck: Supple, no pain or tenderness  CV: No JVD. No peripheral edema.   Pulmonary: Symmetric Chest rise. Normal respiratory effort.  Abdomen: Soft to touch, non-tender.  Ext: No cyanosis, edema, or deformity  Skin: No rash. Normal palpation of skin.   Musculoskeletal: Normal digits and nails by inspection. No clubbing.   Neurologic Examination  Mental status/Cognition: Somnolent with eyes closed, briefly opens eyes to loud voice and answers orientation questions.  She is oriented to self, place, month and year.  She has poor attention and keeps dozing off.  She requires repeated stimulation to stay awake enough to answer questions.   Speech/language: Fluent, comprehension intact, object naming intact, repetition intact. Cranial nerves:   CN II Pupils equal and reactive to light, no VF deficits    CN III,IV,VI EOM intact, no gaze preference or deviation, no nystagmus    CN V normal sensation in V1, V2, and V3 segments bilaterally    CN VII no asymmetry, no nasolabial fold flattening    CN VIII normal hearing to speech    CN IX & X normal palatal elevation, no uvular deviation    CN XI 5/5 head turn and 5/5 shoulder shrug bilaterally    CN XII midline tongue protrusion    Motor:  Muscle bulk: Normal, tone normal Mvmt Root Nerve  Muscle Right Left Comments  SA C5/6 Ax Deltoid     EF C5/6 Mc Biceps     EE C6/7/8 Rad Triceps     WF C6/7 Med FCR     WE C7/8 PIN ECU     F Ab C8/T1 U ADM/FDI 5 5   HF L1/2/3 Fem Illopsoas     KE L2/3/4 Fem Quad     DF L4/5 D Peron Tib Ant 5 5   PF S1/2 Tibial Grc/Sol 5 5    Sensation:  Light touch Intact throughout   Pin prick    Temperature    Vibration   Proprioception    Coordination/Complex Motor:  In restraints and so hard to evaluate coordination.  No obvious ataxia or incoordination noted.   Labs/Imaging/Neurodiagnostic studies   CBC:  Recent Labs  Lab 2024-07-24 1840 07/04/24 1820  WBC 6.9 9.1  NEUTROABS 5.1  --   HGB 12.9 13.0  HCT 40.6 40.2  MCV 88.6 86.5  PLT 233 240   Basic Metabolic Panel:   Lab Results  Component Value Date   NA 131 (L) 07/04/2024   Conley 3.5 07/04/2024   CO2 27 07/04/2024   GLUCOSE 129 (H) 07/04/2024   BUN 13 07/04/2024   CREATININE 0.63 07/04/2024   CALCIUM 9.5 07/04/2024   GFRNONAA >60 07/04/2024   GFRAA >60 08/10/2018   Lipid Panel:  Lab Results  Component Value Date  LDLCALC 94 08/17/2023   HgbA1c:  Lab Results  Component Value Date   HGBA1C 5.5 07/04/2024   Urine Drug Screen: No results found for: LABOPIA, COCAINSCRNUR, LABBENZ, AMPHETMU, THCU, LABBARB  Alcohol Level     Component Value Date/Time   ETH <10 01/27/2021 1757   INR  Lab Results  Component Value Date   INR 1.0 01/27/2021   APTT  Lab Results  Component Value Date   APTT 111 (H) 01/27/2021   AED levels: No results found for: PHENYTOIN, ZONISAMIDE, LAMOTRIGINE, LEVETIRACETA  MRI Brain:   1. No acute intracranial abnormality. 2. Moderate chronic microvascular ischemic disease. 3. Small remote right cerebellar infarct.   MRA HEAD:   Negative intracranial MRA for large vessel occlusion or other emergent finding. No hemodynamically significant or correctable stenosis.   MRA NECK:   1. Negative MRA of the neck for large vessel occlusion or other emergent finding. No hemodynamically significant stenosis about either carotid artery system. 2. Wide patency of both vertebral arteries within the neck.  Neurodiagnostics rEEG:  Pending  ASSESSMENT   YENY SCHMOLL is a 89 y.o. female who presented with episode of aphasia with left-sided weakness lasting 10 minutes on 07/03/2024.  Returns to the ED next day with confusion and agitation.  On my evaluation, despite patient being in restraints, and being sleepy, she can answer questions and follow commands, she can do simple calculations.  She has no arm or leg weakness or focal deficits.  Although it may very well be that her initial episode could have been a TIA.  The fact that she now presents  with confusion, I suspect this could be a possible seizure and she is confused in the postictal period.  Or alternatively, given her advanced age, this may just be delirium.  She has no obvious UTI or URI or pneumonia.  Although COVID/influenza test is pending.  She is afebrile and vitals are not concerning for sepsis.  CBC with no leukocytosis.  Chemistry with no significant electrolyte abnormality.  None of her medications would explain this.  RECOMMENDATIONS  -Routine EEG. -Delirium precautions. -Melatonin 3 mg nightly. -I do not think a repeat MRI is warranted.  She may not tolerate that.  - Will get an echocardiogram given prior concerns for TIA with 10-minute episode of aphasia and left sided weakness. -Continue aspirin  81 mg daily. -Follow-up with neurology outpatient. ______________________________________________________________________    Signed, Ladora Osterberg, MD [facility]

## 2024-07-05 NOTE — Assessment & Plan Note (Addendum)
 At this time patient's blood pressure is a little elevated at 156/91 Patient is normally on hydrochlorothiazide  at home  Given her low Na of 130  Will hold off on this medication  Will start her on hydralazine prn >220/120

## 2024-07-05 NOTE — Progress Notes (Signed)
 PT Cancellation Note  Patient Details Name: Ann Conley MRN: [id number] DOB: 12/03/1933   Cancelled Treatment:    Reason Eval/Treat Not Completed: Patient at procedure or test/unavailable. PT attempts evaluation again however EEG tech is placing EEG leads. PT will follow up  as time allows.   [name] 07/05/2024, 9:46 AM

## 2024-07-05 NOTE — Assessment & Plan Note (Signed)
 Will continue protonix  at this time

## 2024-07-05 NOTE — Progress Notes (Signed)
 PT Cancellation Note  Patient Details Name: Ann Conley MRN: [id number] DOB: 23-Sep-1934   Cancelled Treatment:    Reason Eval/Treat Not Completed: Patient at procedure or test/unavailable. Pt off unit for MRI. PT will follow up as time allows.   Bernardino Ann Conley 07/05/2024, 8:21 AM

## 2024-07-05 NOTE — Progress Notes (Signed)
 Progress Note   Patient: Ann Conley FMW:[id number] DOB: May 04, 1934 DOA: 07/04/2024     0 DOS: the patient was seen and examined on 07/05/2024   Brief hospital course: HAYDE KILGOUR is a 89 y.o. female with medical history significant of essential hypertension, migraine with over, GERD, breast cancer, morbid obesity and OA who presented to the hospital for evaluation of altered mental status.  Patient had presented to the ED yesterday with complaint of aphasia and weakness per daughter.  EMS was called patient went through an evaluation which included MRI that demonstrated no acute intracranial abnormality.  Small remote right cerebellar infarct was noted. She was discharged home as she refused TIA work up. She is more confused per daughter returned to hospital. Neurology evaluated advised EEG.  Assessment and Plan: * Altered mental status Repeat MRI brain negative for acute ischemia CT chest abdomen pelvis was completed today which was negative for infectious etiology Ammonia, TSH,  B12 normal limits. Subjective fevers- will get COVID, RVP panel. Neurology advised EEG - pending. RN noted she is able to tolerate diet.  Hyponatremia- Poor oral intake. Hold chlorthalidone . Sodium 131, will trend.  Essential hypertension Will give hydralazine prn for elevated BP. Hold chorthalidone given hyponatremia.  GERD (gastroesophageal reflux disease) Continue PPI.  OSA- Not compliant with CPAP. Check VBG for co2 level.  Morbid obesity- BMI 45.44 Diet, exercise and weight reduction advised.      Out of bed to chair. Incentive spirometry. Nursing supportive care. Fall, aspiration precautions. Diet:  Diet Orders (From admission, onward)     Start     Ordered   07/05/24 0857  Diet Heart Room service appropriate? Yes; Fluid consistency: Thin  Diet effective now       Question Answer Comment  Room service appropriate? Yes   Fluid consistency: Thin      07/05/24 0856            DVT prophylaxis: enoxaparin  (LOVENOX ) injection 40 mg Start: 07/05/24 1800 SCDs Start: 07/05/24 0748  Level of care: Progressive   Code Status: Full Code  Subjective: Patient is seen and examined today morning. She is lying in bed, able to answer me though sleeps easily. Daughter at bedside states she is not compliant with cpap.  Physical Exam: Vitals:   07/05/24 0930 07/05/24 1000 07/05/24 1035 07/05/24 1222  BP: 133/60 (!) 149/67  (!) 155/69  Pulse: 63 65    Resp: 20 19  14   Temp:   97.9 F (36.6 C) 98 F (36.7 C)  TempSrc:   Axillary Oral  SpO2: 95% 99%  94%  Weight:      Height:        General - Elderly morbidly obese Caucasian female, no distress, sleepy. HEENT - PERRLA, EOMI, atraumatic head, non tender sinuses. Lung - distant breath sounds no rales, rhonchi, wheezes. Heart - S1, S2 heard, no murmurs, rubs, trace pedal edema. Abdomen - Soft, non tender, obese, bowel sounds good Neuro - lethargic able to follow commands, non focal exam. Skin - Warm and dry. Right shin tenderness  Data Reviewed:      Latest Ref Rng & Units 07/04/2024    6:20 PM 07/03/2024    6:40 PM 05/20/2024   11:05 AM  CBC  WBC 4.0 - 10.5 K/uL 9.1  6.9  7.3   Hemoglobin 12.0 - 15.0 g/dL 86.9  87.0  87.5   Hematocrit 36.0 - 46.0 % 40.2  40.6  38.0   Platelets 150 - 400 K/uL  240  233  263.0       Latest Ref Rng & Units 07/04/2024    6:20 PM 07/03/2024    6:40 PM 05/20/2024   11:05 AM  BMP  Glucose 70 - 99 mg/dL 870  872  888   BUN 8 - 23 mg/dL 13  15  19    Creatinine 0.44 - 1.00 mg/dL 9.36  9.37  9.35   Sodium 135 - 145 mmol/L 131  134  136   Potassium 3.5 - 5.1 mmol/L 3.5  4.2  4.1   Chloride 98 - 111 mmol/L 93  97  99   CO2 22 - 32 mmol/L 27  25  31    Calcium 8.9 - 10.3 mg/dL 9.5  9.5  9.7    EEG adult Result Date: 07/05/2024 Shelton Arlin KIDD, MD     07/05/2024 12:20 PM Patient Name: OLANDA BOUGHNER MRN: [id number] Epilepsy Attending: Arlin KIDD Shelton Referring  Physician/Provider: Khaliqdina, Salman, MD Date: 07/05/2024 Duration: 26.02 mins Patient history: 89 y.o. female with aphasia and left-sided weakness that was transient and lasted about 10 minutes and resolved. EEG to evaluate for seizure Level of alertness: Awake, asleep AEDs during EEG study: None Technical aspects: This EEG study was done with scalp electrodes positioned according to the 10-20 International system of electrode placement. Electrical activity was reviewed with band pass filter of 1-70Hz , sensitivity of 7 uV/mm, display speed of 34mm/sec with a 60Hz  notched filter applied as appropriate. EEG data were recorded continuously and digitally stored.  Video monitoring was available and reviewed as appropriate. Description: The posterior dominant rhythm consists of 8 Hz activity of moderate voltage (25-35 uV) seen predominantly in posterior head regions, symmetric and reactive to eye opening and eye closing. Sleep was characterized by vertex waves, sleep spindles (12 to 14 Hz), maximal frontocentral region. EEG showed intermittent generalized 3 to 6 Hz theta-delta slowing. Hyperventilation and photic stimulation were not performed.   ABNORMALITY - Intermittent slow, generalized IMPRESSION: This study is suggestive of mild diffuse encephalopathy. No seizures or epileptiform discharges were seen throughout the recording. Arlin KIDD Shelton   MR BRAIN WO CONTRAST Result Date: 07/05/2024 CLINICAL DATA:  89 year old female with altered mental status.  TIA. EXAM: MRI HEAD WITHOUT CONTRAST TECHNIQUE: Multiplanar, multiecho pulse sequences of the brain and surrounding structures were obtained without intravenous contrast. COMPARISON:  Head CT yesterday.  And recent brain MRI on 07/03/2024. FINDINGS: Brain: No restricted diffusion or evidence of acute infarction. Small chronic cerebellar infarcts unchanged. Small chronic lacunar infarct medial right thalamus. Patchy and scattered bilateral cerebral white matter T2  and FLAIR hyperintensity is stable, moderate. No convincing cortical encephalomalacia. Evidence of chronic microhemorrhage in the bilateral cerebellar hemispheres is stable. No midline shift, mass effect, evidence of mass lesion, ventriculomegaly, extra-axial collection or acute intracranial hemorrhage. Cervicomedullary junction and pituitary are within normal limits. Vascular: Major intracranial vascular flow voids are stable. Skull and upper cervical spine: Negative for age visible cervical spine. Visualized bone marrow signal is within normal limits. Hyperostosis frontalis, normal variant. Sinuses/Orbits: Stable and negative. Other: Stable mild mastoid effusion.  Negative visible nasopharynx. IMPRESSION: 1. No acute intracranial abnormality. 2. Stable MRI appearance of the brain with chronic small vessel disease. Electronically Signed   By: VEAR Hurst M.D.   On: 07/05/2024 09:00   CT CHEST ABDOMEN PELVIS W CONTRAST Result Date: 07/04/2024 CLINICAL DATA:  Sepsis, altered mental status EXAM: CT CHEST, ABDOMEN, AND PELVIS WITH CONTRAST TECHNIQUE: Multidetector CT imaging of the chest, abdomen  and pelvis was performed following the standard protocol during bolus administration of intravenous contrast. RADIATION DOSE REDUCTION: This exam was performed according to the departmental dose-optimization program which includes automated exposure control, adjustment of the mA and/or kV according to patient size and/or use of iterative reconstruction technique. CONTRAST:  75mL OMNIPAQUE  IOHEXOL  350 MG/ML SOLN COMPARISON:  CT abdomen pelvis 02/12/2006 FINDINGS: CT CHEST FINDINGS Cardiovascular: Extensive calcification of the mitral valve annulus. Extensive multi-vessel coronary artery calcification. Global cardiac size ismildly enlarged. No pericardial effusion. Central pulmonary arteries are enlarged in keeping with changes of pulmonary arterial hypertension. Moderate atherosclerotic calcification within the thoracic aorta.  No aortic aneurysm. Mediastinum/Nodes: No enlarged mediastinal, hilar, or axillary lymph nodes. Thyroid  gland, trachea, and esophagus demonstrate no significant findings. Small hiatal hernia Lungs/Pleura: Lungs are clear. No pleural effusion or pneumothorax. Musculoskeletal: No acute bone abnormality. No lytic or blastic bone lesion. Osseous structures are age appropriate. CT ABDOMEN PELVIS FINDINGS Hepatobiliary: No focal liver abnormality is seen. Status post cholecystectomy. Stable mild intra and extrahepatic biliary ductal dilation, likely representing post cholecystectomy change. Pancreas: There is mild dilation of the main pancreatic duct within the body and tail the pancreas with a focal point of transition best appreciated within the mid body of the pancreas (58/3) which may relate to a underlying main duct papillary mucinous neoplasm or underlying main duct stenosis. A focal enhancing mass is not identified. The pancreatic duct is not dilated within the head of the pancreas. No peripancreatic inflammatory stranding or fluid collections are identified. Spleen: Unremarkable Adrenals/Urinary Tract: Adrenal glands are unremarkable. Mild respiratory motion artifact. Kidneys are normal, without renal calculi, focal lesion, or hydronephrosis. Bladder is unremarkable. Stomach/Bowel: Moderate sigmoid diverticulosis. Stomach, small bowel, and large bowel are otherwise unremarkable. Appendix normal. No evidence of obstruction or focal inflammation. No free intraperitoneal gas or fluid. Vascular/Lymphatic: Aortic atherosclerosis. No enlarged abdominal or pelvic lymph nodes. Reproductive: Status post hysterectomy. No adnexal masses. Residual ovaries are unremarkable. Other: Moderate fat containing umbilical and small bilateral fat containing inguinal hernias. No abdominopelvic ascites Musculoskeletal: No acute bone abnormality. No lytic or blastic bone lesion. Osseous structures are age appropriate. IMPRESSION: 1. No  acute intrathoracic or intra-abdominal pathology identified. 2. Extensive multi-vessel coronary artery calcification. Mild global cardiomegaly. 3. Morphologic changes in keeping with pulmonary arterial hypertension. 4. Mild dilation of the main pancreatic duct within the body and tail the pancreas with a focal point of transition best appreciated within the mid body of the pancreas which may relate to a underlying main duct papillary mucinous neoplasm or underlying main duct stenosis. A focal enhancing mass is not identified. 5. Moderate sigmoid diverticulosis without superimposed acute inflammatory change. 6. Moderate fat containing umbilical and small bilateral fat containing inguinal hernias. 7. Aortic atherosclerosis. Aortic Atherosclerosis (ICD10-I70.0). Electronically Signed   By: Dorethia Molt M.D.   On: 07/04/2024 21:33   CT Head Wo Contrast Result Date: 07/04/2024 EXAM: CT HEAD WITHOUT CONTRAST 07/04/2024 07:40:34 PM TECHNIQUE: CT of the head was performed without the administration of intravenous contrast. Automated exposure control, iterative reconstruction, and/or weight based adjustment of the mA/kV was utilized to reduce the radiation dose to as low as reasonably achievable. COMPARISON: CT head without contrast 01/27/2021. MRI head without contrast 07/03/2024. CLINICAL HISTORY: Mental status change, unknown cause. AMS. FINDINGS: BRAIN AND VENTRICLES: No acute hemorrhage. No evidence of acute infarct. No hydrocephalus. No extra-axial collection. No mass effect or midline shift. Mild atrophy and moderate diffuse white matter disease is again noted. A remote lacunar infarct  is present in the right cerebellum. ORBITS: Bilateral lens replacements are noted. The globes and orbits are otherwise within normal limits. SINUSES: No acute abnormality. SOFT TISSUES AND SKULL: No acute soft tissue abnormality. No skull fracture. IMPRESSION: 1. No acute intracranial abnormality. 2. Mild atrophy and moderate  diffuse white matter disease. 3. Rule out lacunar infarct in the right cerebellum. Electronically signed by: Lonni Necessary MD 07/04/2024 07:54 PM EDT RP Workstation: [id number]   MR BRAIN WO CONTRAST Result Date: 07/03/2024 CLINICAL DATA:  Initial evaluation for acute neuro deficit, stroke suspected. EXAM: MRI HEAD WITHOUT CONTRAST MRA HEAD WITHOUT CONTRAST MRA NECK WITHOUT AND WITH CONTRAST TECHNIQUE: Multiplanar, multi-echo pulse sequences of the brain and surrounding structures were acquired without intravenous contrast. Angiographic images of the Circle of Willis were acquired using MRA technique without intravenous contrast. Angiographic images of the neck were acquired using MRA technique without and with intravenous contrast. Carotid stenosis measurements (when applicable) are obtained utilizing NASCET criteria, using the distal internal carotid diameter as the denominator. CONTRAST:  10mL GADAVIST  GADOBUTROL  1 MMOL/ML IV SOLN COMPARISON:  Comparison made with prior study from 02/13/2021. FINDINGS: MRI HEAD FINDINGS Brain: Cerebral volume within normal limits. Patchy T2/FLAIR hyperintensity involving the periventricular deep white matter both cerebral hemispheres, consistent with chronic small vessel ischemic disease, moderate in nature. Small remote right cerebellar infarct noted (series 10, image 8). No evidence for acute or subacute infarct. No areas of chronic cortical infarction. No acute intracranial hemorrhage. Multiple scattered chronic micro hemorrhages noted, most pronounced about the cerebellum, likely hypertensive in nature. No mass lesion, midline shift or mass effect. No hydrocephalus or extra-axial fluid collection. Pituitary gland within normal limits. Vascular: Major intracranial vascular flow voids are maintained. Skull and upper cervical spine: Craniocervical junction within normal limits. Bone marrow signal intensity overall within normal limits. Degenerative reactive endplate  changes noted about the visualized C5-6 interspace. Hyperostosis frontalis interna noted. No scalp soft tissue abnormality. Sinuses/Orbits: Prior bilateral ocular lens replacement. Paranasal sinuses are largely clear. Trace left mastoid effusion noted, of doubtful significance. Other: None. MRA HEAD FINDINGS Anterior circulation: Both internal carotid arteries widely patent to the siphons to the termini without stenosis or other abnormality. A1 segments, anterior communicating artery complex common anterior cerebral arteries patent without stenosis. No M1 stenosis or occlusion. No proximal MCA branch occlusion. Distal MCA branches perfused and symmetric. Posterior circulation: Both V4 segments patent without stenosis. Left vertebral artery slightly dominant. Both PICA patent. Basilar patent without stenosis. Superior cerebral arteries patent bilaterally. Right PCA supplied via the basilar as well as a prominent right posterior communicating artery. Fetal type origin left PCA. Both PCAs patent to their distal aspects without stenosis. Anatomic variants: As above.  No aneurysm. MRA NECK FINDINGS Aortic arch: Examination moderately degraded by motion artifact. Visualized aortic arch within normal limits for caliber with standard branch pattern. No stenosis about the origin of the great vessels. Right carotid system: Right common and internal carotid arteries are patent with antegrade flow. No visible dissection. No hemodynamically significant stenosis about the right carotid artery system. Left carotid system: Left common and internal carotid arteries are patent with antegrade flow. No visible dissection. No hemodynamically significant stenosis about the left carotid artery system. Vertebral arteries: Neither vertebral artery origin visualized on this exam. Visualized portions of vertebral arteries patent with antegrade flow. No stenosis or dissection. Other: None IMPRESSION: MRI HEAD: 1. No acute intracranial  abnormality. 2. Moderate chronic microvascular ischemic disease. 3. Small remote right cerebellar infarct. MRA HEAD: Negative  intracranial MRA for large vessel occlusion or other emergent finding. No hemodynamically significant or correctable stenosis. MRA NECK: 1. Negative MRA of the neck for large vessel occlusion or other emergent finding. No hemodynamically significant stenosis about either carotid artery system. 2. Wide patency of both vertebral arteries within the neck. Electronically Signed   By: Morene Hoard M.D.   On: 07/03/2024 23:06   MR ANGIO HEAD WO CONTRAST Result Date: 07/03/2024 CLINICAL DATA:  Initial evaluation for acute neuro deficit, stroke suspected. EXAM: MRI HEAD WITHOUT CONTRAST MRA HEAD WITHOUT CONTRAST MRA NECK WITHOUT AND WITH CONTRAST TECHNIQUE: Multiplanar, multi-echo pulse sequences of the brain and surrounding structures were acquired without intravenous contrast. Angiographic images of the Circle of Willis were acquired using MRA technique without intravenous contrast. Angiographic images of the neck were acquired using MRA technique without and with intravenous contrast. Carotid stenosis measurements (when applicable) are obtained utilizing NASCET criteria, using the distal internal carotid diameter as the denominator. CONTRAST:  10mL GADAVIST  GADOBUTROL  1 MMOL/ML IV SOLN COMPARISON:  Comparison made with prior study from 02/13/2021. FINDINGS: MRI HEAD FINDINGS Brain: Cerebral volume within normal limits. Patchy T2/FLAIR hyperintensity involving the periventricular deep white matter both cerebral hemispheres, consistent with chronic small vessel ischemic disease, moderate in nature. Small remote right cerebellar infarct noted (series 10, image 8). No evidence for acute or subacute infarct. No areas of chronic cortical infarction. No acute intracranial hemorrhage. Multiple scattered chronic micro hemorrhages noted, most pronounced about the cerebellum, likely hypertensive  in nature. No mass lesion, midline shift or mass effect. No hydrocephalus or extra-axial fluid collection. Pituitary gland within normal limits. Vascular: Major intracranial vascular flow voids are maintained. Skull and upper cervical spine: Craniocervical junction within normal limits. Bone marrow signal intensity overall within normal limits. Degenerative reactive endplate changes noted about the visualized C5-6 interspace. Hyperostosis frontalis interna noted. No scalp soft tissue abnormality. Sinuses/Orbits: Prior bilateral ocular lens replacement. Paranasal sinuses are largely clear. Trace left mastoid effusion noted, of doubtful significance. Other: None. MRA HEAD FINDINGS Anterior circulation: Both internal carotid arteries widely patent to the siphons to the termini without stenosis or other abnormality. A1 segments, anterior communicating artery complex common anterior cerebral arteries patent without stenosis. No M1 stenosis or occlusion. No proximal MCA branch occlusion. Distal MCA branches perfused and symmetric. Posterior circulation: Both V4 segments patent without stenosis. Left vertebral artery slightly dominant. Both PICA patent. Basilar patent without stenosis. Superior cerebral arteries patent bilaterally. Right PCA supplied via the basilar as well as a prominent right posterior communicating artery. Fetal type origin left PCA. Both PCAs patent to their distal aspects without stenosis. Anatomic variants: As above.  No aneurysm. MRA NECK FINDINGS Aortic arch: Examination moderately degraded by motion artifact. Visualized aortic arch within normal limits for caliber with standard branch pattern. No stenosis about the origin of the great vessels. Right carotid system: Right common and internal carotid arteries are patent with antegrade flow. No visible dissection. No hemodynamically significant stenosis about the right carotid artery system. Left carotid system: Left common and internal carotid  arteries are patent with antegrade flow. No visible dissection. No hemodynamically significant stenosis about the left carotid artery system. Vertebral arteries: Neither vertebral artery origin visualized on this exam. Visualized portions of vertebral arteries patent with antegrade flow. No stenosis or dissection. Other: None IMPRESSION: MRI HEAD: 1. No acute intracranial abnormality. 2. Moderate chronic microvascular ischemic disease. 3. Small remote right cerebellar infarct. MRA HEAD: Negative intracranial MRA for large vessel occlusion or other emergent  finding. No hemodynamically significant or correctable stenosis. MRA NECK: 1. Negative MRA of the neck for large vessel occlusion or other emergent finding. No hemodynamically significant stenosis about either carotid artery system. 2. Wide patency of both vertebral arteries within the neck. Electronically Signed   By: Morene Hoard M.D.   On: 07/03/2024 23:06   MR Angiogram Neck W or Wo Contrast Result Date: 07/03/2024 CLINICAL DATA:  Initial evaluation for acute neuro deficit, stroke suspected. EXAM: MRI HEAD WITHOUT CONTRAST MRA HEAD WITHOUT CONTRAST MRA NECK WITHOUT AND WITH CONTRAST TECHNIQUE: Multiplanar, multi-echo pulse sequences of the brain and surrounding structures were acquired without intravenous contrast. Angiographic images of the Circle of Willis were acquired using MRA technique without intravenous contrast. Angiographic images of the neck were acquired using MRA technique without and with intravenous contrast. Carotid stenosis measurements (when applicable) are obtained utilizing NASCET criteria, using the distal internal carotid diameter as the denominator. CONTRAST:  10mL GADAVIST  GADOBUTROL  1 MMOL/ML IV SOLN COMPARISON:  Comparison made with prior study from 02/13/2021. FINDINGS: MRI HEAD FINDINGS Brain: Cerebral volume within normal limits. Patchy T2/FLAIR hyperintensity involving the periventricular deep white matter both cerebral  hemispheres, consistent with chronic small vessel ischemic disease, moderate in nature. Small remote right cerebellar infarct noted (series 10, image 8). No evidence for acute or subacute infarct. No areas of chronic cortical infarction. No acute intracranial hemorrhage. Multiple scattered chronic micro hemorrhages noted, most pronounced about the cerebellum, likely hypertensive in nature. No mass lesion, midline shift or mass effect. No hydrocephalus or extra-axial fluid collection. Pituitary gland within normal limits. Vascular: Major intracranial vascular flow voids are maintained. Skull and upper cervical spine: Craniocervical junction within normal limits. Bone marrow signal intensity overall within normal limits. Degenerative reactive endplate changes noted about the visualized C5-6 interspace. Hyperostosis frontalis interna noted. No scalp soft tissue abnormality. Sinuses/Orbits: Prior bilateral ocular lens replacement. Paranasal sinuses are largely clear. Trace left mastoid effusion noted, of doubtful significance. Other: None. MRA HEAD FINDINGS Anterior circulation: Both internal carotid arteries widely patent to the siphons to the termini without stenosis or other abnormality. A1 segments, anterior communicating artery complex common anterior cerebral arteries patent without stenosis. No M1 stenosis or occlusion. No proximal MCA branch occlusion. Distal MCA branches perfused and symmetric. Posterior circulation: Both V4 segments patent without stenosis. Left vertebral artery slightly dominant. Both PICA patent. Basilar patent without stenosis. Superior cerebral arteries patent bilaterally. Right PCA supplied via the basilar as well as a prominent right posterior communicating artery. Fetal type origin left PCA. Both PCAs patent to their distal aspects without stenosis. Anatomic variants: As above.  No aneurysm. MRA NECK FINDINGS Aortic arch: Examination moderately degraded by motion artifact. Visualized  aortic arch within normal limits for caliber with standard branch pattern. No stenosis about the origin of the great vessels. Right carotid system: Right common and internal carotid arteries are patent with antegrade flow. No visible dissection. No hemodynamically significant stenosis about the right carotid artery system. Left carotid system: Left common and internal carotid arteries are patent with antegrade flow. No visible dissection. No hemodynamically significant stenosis about the left carotid artery system. Vertebral arteries: Neither vertebral artery origin visualized on this exam. Visualized portions of vertebral arteries patent with antegrade flow. No stenosis or dissection. Other: None IMPRESSION: MRI HEAD: 1. No acute intracranial abnormality. 2. Moderate chronic microvascular ischemic disease. 3. Small remote right cerebellar infarct. MRA HEAD: Negative intracranial MRA for large vessel occlusion or other emergent finding. No hemodynamically significant or correctable stenosis.  MRA NECK: 1. Negative MRA of the neck for large vessel occlusion or other emergent finding. No hemodynamically significant stenosis about either carotid artery system. 2. Wide patency of both vertebral arteries within the neck. Electronically Signed   By: Morene Hoard M.D.   On: 07/03/2024 23:06    Family Communication: Discussed with patient's daughter at bedside, understand and agree. All questions answered.  Disposition: Status is: Inpatient Remains inpatient appropriate because: confused, lethargic. PT/ OT eval.  Planned Discharge Destination: Home with Home Health     Time spent: 44 minutes  Author: Concepcion Riser, MD 07/05/2024 1:39 PM Secure chat 7am to 7pm For on call review [email or web address].

## 2024-07-05 NOTE — Progress Notes (Signed)
 Routine EEG completed, results pending Neurology review and interpretation

## 2024-07-05 NOTE — Progress Notes (Signed)
 OT Cancellation Note  Patient Details Name: Ann Conley MRN: [id number] DOB: 1934-07-31   Cancelled Treatment:    Reason Eval/Treat Not Completed: Other (comment) (EEG placing leads, will return as schedule allows.)  Ann Conley, OTD, OTR/L [facility] Office: [phone]   Ann Conley 07/05/2024, 9:46 AM

## 2024-07-05 NOTE — Progress Notes (Signed)
 Pt currently in MRI and not available for EEG. Will check back as schedule permits

## 2024-07-06 ENCOUNTER — Other Ambulatory Visit (HOSPITAL_COMMUNITY): Payer: Self-pay

## 2024-07-06 DIAGNOSIS — I10 Essential (primary) hypertension: Secondary | ICD-10-CM

## 2024-07-06 DIAGNOSIS — G9341 Metabolic encephalopathy: Secondary | ICD-10-CM | POA: Diagnosis not present

## 2024-07-06 DIAGNOSIS — E871 Hypo-osmolality and hyponatremia: Secondary | ICD-10-CM | POA: Diagnosis not present

## 2024-07-06 DIAGNOSIS — E876 Hypokalemia: Secondary | ICD-10-CM

## 2024-07-06 DIAGNOSIS — K219 Gastro-esophageal reflux disease without esophagitis: Secondary | ICD-10-CM | POA: Diagnosis not present

## 2024-07-06 DIAGNOSIS — R4182 Altered mental status, unspecified: Secondary | ICD-10-CM | POA: Diagnosis not present

## 2024-07-06 LAB — BASIC METABOLIC PANEL WITH GFR
Anion gap: 11 (ref 5–15)
BUN: 14 mg/dL (ref 8–23)
CO2: 26 mmol/L (ref 22–32)
Calcium: 9.1 mg/dL (ref 8.9–10.3)
Chloride: 95 mmol/L — ABNORMAL LOW (ref 98–111)
Creatinine, Ser: 0.52 mg/dL (ref 0.44–1.00)
GFR, Estimated: >60 mL/min (ref >60)
Glucose, Bld: 109 mg/dL — ABNORMAL HIGH (ref 70–99)
Potassium: 3.3 mmol/L — ABNORMAL LOW (ref 3.5–5.1)
Sodium: 132 mmol/L — ABNORMAL LOW (ref 135–145)

## 2024-07-06 MED ORDER — AMLODIPINE BESYLATE 10 MG PO TABS
10.0000 mg | ORAL_TABLET | Freq: Every day | ORAL | 2 refills | Status: DC
  Filled 2024-07-06: qty 30, 30d supply, fill #0

## 2024-07-06 MED ORDER — AMLODIPINE BESYLATE 5 MG PO TABS: 10.0000 mg | ORAL_TABLET | Freq: Every day | ORAL | Status: DC

## 2024-07-06 MED ORDER — AMLODIPINE BESYLATE 10 MG PO TABS: 10.0000 mg | ORAL_TABLET | Freq: Every day | ORAL | 2 refills | Status: DC

## 2024-07-06 NOTE — Plan of Care (Signed)
 Pt d/c home with home health PT/OT. AVS reviewed with family no follow up questions. IV removed/tele removed. All belongings returned. No other needs voiced at this time. BP (!) 152/65 (BP Location: Right Arm)   Pulse 75   Temp (!) 97.3 F (36.3 C) (Oral)   Resp 18   Ht 4' 11 (1.499 m)   Wt 102.1 kg   SpO2 95%   BMI 45.44 kg/m  Aureliano VEAR Louder 07/06/24 12:37 PM

## 2024-07-06 NOTE — Discharge Summary (Signed)
 Physician Discharge Summary   Patient: Ann Conley MRN: [id number] DOB: 03-24-1934  Admit date:     07/04/2024  Discharge date: {dischdate:26783}  Discharge Physician: Ann Conley   PCP: Ann Comer BRAVO, MD   Recommendations at discharge:  {Tip this will not be part of the note when signed- Example include specific recommendations for outpatient follow-up, pending tests to follow-up on. (Optional):26781}  ***  Discharge Diagnoses: Principal Problem:   Altered mental status Active Problems:   Essential hypertension   Migraine with aura   GERD (gastroesophageal reflux disease)   Acute metabolic encephalopathy  Resolved Problems:   * No resolved hospital problems. *  Hospital Course: No notes on file  Assessment and Plan: * Altered mental status Patient had MRI brain performed yesterday which was negative for acute ischemia The patient was thought to have possible infectious etiology perhaps contributing and CT chest abdomen pelvis was completed today which was negative for infectious etiology Repeat head CT was negative TSH,  B12 have been ordered Patient had ammonia level which was negative Given she had a subjective fever per daughter, will send nasal swab to rule out possible covid etiology  Will also consider possible LP for further evaluation Neurology has been consulted   Essential hypertension At this time patient's blood pressure is a little elevated at 156/91 Patient is normally on hydrochlorothiazide  at home  Given her low Na of 130  Will hold off on this medication  Will start her on hydralazine prn >220/120  GERD (gastroesophageal reflux disease) Will continue protonix  at this time      {Tip this will not be part of the note when signed Body mass index is 45.44 kg/m. , ,  (Optional):26781}  {(NOTE) Pain control PDMP Statment (Optional):26782} Consultants: *** Procedures performed: ***  Disposition: {Plan; Disposition:26390} Diet  recommendation:  Discharge Diet Orders (From admission, onward)     Start     Ordered   07/06/24 0000  Diet - low sodium heart healthy        07/06/24 1106           {Diet_Plan:26776} DISCHARGE MEDICATION: Allergies as of 07/06/2024       Reactions   Codeine Nausea And Vomiting        Medication List     STOP taking these medications    chlorthalidone  25 MG tablet Commonly known as: HYGROTON        TAKE these medications    amLODipine  10 MG tablet Commonly known as: NORVASC  Take 1 tablet (10 mg total) by mouth daily.   Aspirin  Low Dose 81 MG chewable tablet Generic drug: aspirin  Chew 1 tablet (81 mg total) by mouth daily.   ibuprofen  800 MG tablet Commonly known as: ADVIL  Take 1 tablet (800 mg total) by mouth every 8 (eight) hours as needed for headache. What changed: Another medication with the same name was removed. Continue taking this medication, and follow the directions you see here.   Magnesium 250 MG Tabs Take 250 mg by mouth as needed.   Vitamin D  (Ergocalciferol ) 1.25 MG (50000 UNIT) Caps capsule Commonly known as: DRISDOL  Take 1 capsule (50,000 Units total) by mouth every 7 (seven) days.        Follow-up Information     [facility] Follow up.   Specialty: Home Health Services Why: they will call you to set up home services Contact information: [address] [place] [address] [phone]  Discharge Exam: Filed Weights   07/04/24 1811  Weight: 102.1 kg   ***  Condition at discharge: {DC Condition:26389}  The results of significant diagnostics from this hospitalization (including imaging, microbiology, ancillary and laboratory) are listed below for reference.   Imaging Studies: EEG adult Result Date: 07/05/2024 Conley Ann KIDD, MD     07/05/2024 12:20 PM Patient Name: Ann Conley MRN: [id number] Epilepsy Attending: Arlin KIDD Conley Referring Physician/Provider: Khaliqdina,  Salman, MD Date: 07/05/2024 Duration: 26.02 mins Patient history: 89 y.o. female with aphasia and left-sided weakness that was transient and lasted about 10 minutes and resolved. EEG to evaluate for seizure Level of alertness: Awake, asleep AEDs during EEG study: None Technical aspects: This EEG study was done with scalp electrodes positioned according to the 10-20 International system of electrode placement. Electrical activity was reviewed with band pass filter of 1-70Hz , sensitivity of 7 uV/mm, display speed of 67mm/sec with a 60Hz  notched filter applied as appropriate. EEG data were recorded continuously and digitally stored.  Video monitoring was available and reviewed as appropriate. Description: The posterior dominant rhythm consists of 8 Hz activity of moderate voltage (25-35 uV) seen predominantly in posterior head regions, symmetric and reactive to eye opening and eye closing. Sleep was characterized by vertex waves, sleep spindles (12 to 14 Hz), maximal frontocentral region. EEG showed intermittent generalized 3 to 6 Hz theta-delta slowing. Hyperventilation and photic stimulation were not performed.   ABNORMALITY - Intermittent slow, generalized IMPRESSION: This study is suggestive of mild diffuse encephalopathy. No seizures or epileptiform discharges were seen throughout the recording. Ann Conley   MR BRAIN WO CONTRAST Result Date: 07/05/2024 CLINICAL DATA:  89 year old female with altered mental status.  TIA. EXAM: MRI HEAD WITHOUT CONTRAST TECHNIQUE: Multiplanar, multiecho pulse sequences of the brain and surrounding structures were obtained without intravenous contrast. COMPARISON:  Head CT yesterday.  And recent brain MRI on 07/03/2024. FINDINGS: Brain: No restricted diffusion or evidence of acute infarction. Small chronic cerebellar infarcts unchanged. Small chronic lacunar infarct medial right thalamus. Patchy and scattered bilateral cerebral white matter T2 and FLAIR hyperintensity is  stable, moderate. No convincing cortical encephalomalacia. Evidence of chronic microhemorrhage in the bilateral cerebellar hemispheres is stable. No midline shift, mass effect, evidence of mass lesion, ventriculomegaly, extra-axial collection or acute intracranial hemorrhage. Cervicomedullary junction and pituitary are within normal limits. Vascular: Major intracranial vascular flow voids are stable. Skull and upper cervical spine: Negative for age visible cervical spine. Visualized bone marrow signal is within normal limits. Hyperostosis frontalis, normal variant. Sinuses/Orbits: Stable and negative. Other: Stable mild mastoid effusion.  Negative visible nasopharynx. IMPRESSION: 1. No acute intracranial abnormality. 2. Stable MRI appearance of the brain with chronic small vessel disease. Electronically Signed   By: VEAR Hurst M.D.   On: 07/05/2024 09:00   CT CHEST ABDOMEN PELVIS W CONTRAST Result Date: 07/04/2024 CLINICAL DATA:  Sepsis, altered mental status EXAM: CT CHEST, ABDOMEN, AND PELVIS WITH CONTRAST TECHNIQUE: Multidetector CT imaging of the chest, abdomen and pelvis was performed following the standard protocol during bolus administration of intravenous contrast. RADIATION DOSE REDUCTION: This exam was performed according to the departmental dose-optimization program which includes automated exposure control, adjustment of the mA and/or kV according to patient size and/or use of iterative reconstruction technique. CONTRAST:  75mL OMNIPAQUE  IOHEXOL  350 MG/ML SOLN COMPARISON:  CT abdomen pelvis 02/12/2006 FINDINGS: CT CHEST FINDINGS Cardiovascular: Extensive calcification of the mitral valve annulus. Extensive multi-vessel coronary artery calcification. Global cardiac size ismildly enlarged. No pericardial effusion. Central  pulmonary arteries are enlarged in keeping with changes of pulmonary arterial hypertension. Moderate atherosclerotic calcification within the thoracic aorta. No aortic aneurysm.  Mediastinum/Nodes: No enlarged mediastinal, hilar, or axillary lymph nodes. Thyroid  gland, trachea, and esophagus demonstrate no significant findings. Small hiatal hernia Lungs/Pleura: Lungs are clear. No pleural effusion or pneumothorax. Musculoskeletal: No acute bone abnormality. No lytic or blastic bone lesion. Osseous structures are age appropriate. CT ABDOMEN PELVIS FINDINGS Hepatobiliary: No focal liver abnormality is seen. Status post cholecystectomy. Stable mild intra and extrahepatic biliary ductal dilation, likely representing post cholecystectomy change. Pancreas: There is mild dilation of the main pancreatic duct within the body and tail the pancreas with a focal point of transition best appreciated within the mid body of the pancreas (58/3) which may relate to a underlying main duct papillary mucinous neoplasm or underlying main duct stenosis. A focal enhancing mass is not identified. The pancreatic duct is not dilated within the head of the pancreas. No peripancreatic inflammatory stranding or fluid collections are identified. Spleen: Unremarkable Adrenals/Urinary Tract: Adrenal glands are unremarkable. Mild respiratory motion artifact. Kidneys are normal, without renal calculi, focal lesion, or hydronephrosis. Bladder is unremarkable. Stomach/Bowel: Moderate sigmoid diverticulosis. Stomach, small bowel, and large bowel are otherwise unremarkable. Appendix normal. No evidence of obstruction or focal inflammation. No free intraperitoneal gas or fluid. Vascular/Lymphatic: Aortic atherosclerosis. No enlarged abdominal or pelvic lymph nodes. Reproductive: Status post hysterectomy. No adnexal masses. Residual ovaries are unremarkable. Other: Moderate fat containing umbilical and small bilateral fat containing inguinal hernias. No abdominopelvic ascites Musculoskeletal: No acute bone abnormality. No lytic or blastic bone lesion. Osseous structures are age appropriate. IMPRESSION: 1. No acute intrathoracic or  intra-abdominal pathology identified. 2. Extensive multi-vessel coronary artery calcification. Mild global cardiomegaly. 3. Morphologic changes in keeping with pulmonary arterial hypertension. 4. Mild dilation of the main pancreatic duct within the body and tail the pancreas with a focal point of transition best appreciated within the mid body of the pancreas which may relate to a underlying main duct papillary mucinous neoplasm or underlying main duct stenosis. A focal enhancing mass is not identified. 5. Moderate sigmoid diverticulosis without superimposed acute inflammatory change. 6. Moderate fat containing umbilical and small bilateral fat containing inguinal hernias. 7. Aortic atherosclerosis. Aortic Atherosclerosis (ICD10-I70.0). Electronically Signed   By: Dorethia Molt M.D.   On: 07/04/2024 21:33   CT Head Wo Contrast Result Date: 07/04/2024 EXAM: CT HEAD WITHOUT CONTRAST 07/04/2024 07:40:34 PM TECHNIQUE: CT of the head was performed without the administration of intravenous contrast. Automated exposure control, iterative reconstruction, and/or weight based adjustment of the mA/kV was utilized to reduce the radiation dose to as low as reasonably achievable. COMPARISON: CT head without contrast 01/27/2021. MRI head without contrast 07/03/2024. CLINICAL HISTORY: Mental status change, unknown cause. AMS. FINDINGS: BRAIN AND VENTRICLES: No acute hemorrhage. No evidence of acute infarct. No hydrocephalus. No extra-axial collection. No mass effect or midline shift. Mild atrophy and moderate diffuse white matter disease is again noted. A remote lacunar infarct is present in the right cerebellum. ORBITS: Bilateral lens replacements are noted. The globes and orbits are otherwise within normal limits. SINUSES: No acute abnormality. SOFT TISSUES AND SKULL: No acute soft tissue abnormality. No skull fracture. IMPRESSION: 1. No acute intracranial abnormality. 2. Mild atrophy and moderate diffuse white matter  disease. 3. Rule out lacunar infarct in the right cerebellum. Electronically signed by: Lonni Necessary MD 07/04/2024 07:54 PM EDT RP Workstation: [id number]   MR BRAIN WO CONTRAST Result Date: 07/03/2024 CLINICAL DATA:  Initial  evaluation for acute neuro deficit, stroke suspected. EXAM: MRI HEAD WITHOUT CONTRAST MRA HEAD WITHOUT CONTRAST MRA NECK WITHOUT AND WITH CONTRAST TECHNIQUE: Multiplanar, multi-echo pulse sequences of the brain and surrounding structures were acquired without intravenous contrast. Angiographic images of the Circle of Willis were acquired using MRA technique without intravenous contrast. Angiographic images of the neck were acquired using MRA technique without and with intravenous contrast. Carotid stenosis measurements (when applicable) are obtained utilizing NASCET criteria, using the distal internal carotid diameter as the denominator. CONTRAST:  10mL GADAVIST  GADOBUTROL  1 MMOL/ML IV SOLN COMPARISON:  Comparison made with prior study from 02/13/2021. FINDINGS: MRI HEAD FINDINGS Brain: Cerebral volume within normal limits. Patchy T2/FLAIR hyperintensity involving the periventricular deep white matter both cerebral hemispheres, consistent with chronic small vessel ischemic disease, moderate in nature. Small remote right cerebellar infarct noted (series 10, image 8). No evidence for acute or subacute infarct. No areas of chronic cortical infarction. No acute intracranial hemorrhage. Multiple scattered chronic micro hemorrhages noted, most pronounced about the cerebellum, likely hypertensive in nature. No mass lesion, midline shift or mass effect. No hydrocephalus or extra-axial fluid collection. Pituitary gland within normal limits. Vascular: Major intracranial vascular flow voids are maintained. Skull and upper cervical spine: Craniocervical junction within normal limits. Bone marrow signal intensity overall within normal limits. Degenerative reactive endplate changes noted about the  visualized C5-6 interspace. Hyperostosis frontalis interna noted. No scalp soft tissue abnormality. Sinuses/Orbits: Prior bilateral ocular lens replacement. Paranasal sinuses are largely clear. Trace left mastoid effusion noted, of doubtful significance. Other: None. MRA HEAD FINDINGS Anterior circulation: Both internal carotid arteries widely patent to the siphons to the termini without stenosis or other abnormality. A1 segments, anterior communicating artery complex common anterior cerebral arteries patent without stenosis. No M1 stenosis or occlusion. No proximal MCA branch occlusion. Distal MCA branches perfused and symmetric. Posterior circulation: Both V4 segments patent without stenosis. Left vertebral artery slightly dominant. Both PICA patent. Basilar patent without stenosis. Superior cerebral arteries patent bilaterally. Right PCA supplied via the basilar as well as a prominent right posterior communicating artery. Fetal type origin left PCA. Both PCAs patent to their distal aspects without stenosis. Anatomic variants: As above.  No aneurysm. MRA NECK FINDINGS Aortic arch: Examination moderately degraded by motion artifact. Visualized aortic arch within normal limits for caliber with standard branch pattern. No stenosis about the origin of the great vessels. Right carotid system: Right common and internal carotid arteries are patent with antegrade flow. No visible dissection. No hemodynamically significant stenosis about the right carotid artery system. Left carotid system: Left common and internal carotid arteries are patent with antegrade flow. No visible dissection. No hemodynamically significant stenosis about the left carotid artery system. Vertebral arteries: Neither vertebral artery origin visualized on this exam. Visualized portions of vertebral arteries patent with antegrade flow. No stenosis or dissection. Other: None IMPRESSION: MRI HEAD: 1. No acute intracranial abnormality. 2. Moderate chronic  microvascular ischemic disease. 3. Small remote right cerebellar infarct. MRA HEAD: Negative intracranial MRA for large vessel occlusion or other emergent finding. No hemodynamically significant or correctable stenosis. MRA NECK: 1. Negative MRA of the neck for large vessel occlusion or other emergent finding. No hemodynamically significant stenosis about either carotid artery system. 2. Wide patency of both vertebral arteries within the neck. Electronically Signed   By: Morene Hoard M.D.   On: 07/03/2024 23:06   MR ANGIO HEAD WO CONTRAST Result Date: 07/03/2024 CLINICAL DATA:  Initial evaluation for acute neuro deficit, stroke suspected. EXAM: MRI  HEAD WITHOUT CONTRAST MRA HEAD WITHOUT CONTRAST MRA NECK WITHOUT AND WITH CONTRAST TECHNIQUE: Multiplanar, multi-echo pulse sequences of the brain and surrounding structures were acquired without intravenous contrast. Angiographic images of the Circle of Willis were acquired using MRA technique without intravenous contrast. Angiographic images of the neck were acquired using MRA technique without and with intravenous contrast. Carotid stenosis measurements (when applicable) are obtained utilizing NASCET criteria, using the distal internal carotid diameter as the denominator. CONTRAST:  10mL GADAVIST  GADOBUTROL  1 MMOL/ML IV SOLN COMPARISON:  Comparison made with prior study from 02/13/2021. FINDINGS: MRI HEAD FINDINGS Brain: Cerebral volume within normal limits. Patchy T2/FLAIR hyperintensity involving the periventricular deep white matter both cerebral hemispheres, consistent with chronic small vessel ischemic disease, moderate in nature. Small remote right cerebellar infarct noted (series 10, image 8). No evidence for acute or subacute infarct. No areas of chronic cortical infarction. No acute intracranial hemorrhage. Multiple scattered chronic micro hemorrhages noted, most pronounced about the cerebellum, likely hypertensive in nature. No mass lesion, midline  shift or mass effect. No hydrocephalus or extra-axial fluid collection. Pituitary gland within normal limits. Vascular: Major intracranial vascular flow voids are maintained. Skull and upper cervical spine: Craniocervical junction within normal limits. Bone marrow signal intensity overall within normal limits. Degenerative reactive endplate changes noted about the visualized C5-6 interspace. Hyperostosis frontalis interna noted. No scalp soft tissue abnormality. Sinuses/Orbits: Prior bilateral ocular lens replacement. Paranasal sinuses are largely clear. Trace left mastoid effusion noted, of doubtful significance. Other: None. MRA HEAD FINDINGS Anterior circulation: Both internal carotid arteries widely patent to the siphons to the termini without stenosis or other abnormality. A1 segments, anterior communicating artery complex common anterior cerebral arteries patent without stenosis. No M1 stenosis or occlusion. No proximal MCA branch occlusion. Distal MCA branches perfused and symmetric. Posterior circulation: Both V4 segments patent without stenosis. Left vertebral artery slightly dominant. Both PICA patent. Basilar patent without stenosis. Superior cerebral arteries patent bilaterally. Right PCA supplied via the basilar as well as a prominent right posterior communicating artery. Fetal type origin left PCA. Both PCAs patent to their distal aspects without stenosis. Anatomic variants: As above.  No aneurysm. MRA NECK FINDINGS Aortic arch: Examination moderately degraded by motion artifact. Visualized aortic arch within normal limits for caliber with standard branch pattern. No stenosis about the origin of the great vessels. Right carotid system: Right common and internal carotid arteries are patent with antegrade flow. No visible dissection. No hemodynamically significant stenosis about the right carotid artery system. Left carotid system: Left common and internal carotid arteries are patent with antegrade flow.  No visible dissection. No hemodynamically significant stenosis about the left carotid artery system. Vertebral arteries: Neither vertebral artery origin visualized on this exam. Visualized portions of vertebral arteries patent with antegrade flow. No stenosis or dissection. Other: None IMPRESSION: MRI HEAD: 1. No acute intracranial abnormality. 2. Moderate chronic microvascular ischemic disease. 3. Small remote right cerebellar infarct. MRA HEAD: Negative intracranial MRA for large vessel occlusion or other emergent finding. No hemodynamically significant or correctable stenosis. MRA NECK: 1. Negative MRA of the neck for large vessel occlusion or other emergent finding. No hemodynamically significant stenosis about either carotid artery system. 2. Wide patency of both vertebral arteries within the neck. Electronically Signed   By: Morene Hoard M.D.   On: 07/03/2024 23:06   MR Angiogram Neck W or Wo Contrast Result Date: 07/03/2024 CLINICAL DATA:  Initial evaluation for acute neuro deficit, stroke suspected. EXAM: MRI HEAD WITHOUT CONTRAST MRA HEAD WITHOUT CONTRAST  MRA NECK WITHOUT AND WITH CONTRAST TECHNIQUE: Multiplanar, multi-echo pulse sequences of the brain and surrounding structures were acquired without intravenous contrast. Angiographic images of the Circle of Willis were acquired using MRA technique without intravenous contrast. Angiographic images of the neck were acquired using MRA technique without and with intravenous contrast. Carotid stenosis measurements (when applicable) are obtained utilizing NASCET criteria, using the distal internal carotid diameter as the denominator. CONTRAST:  10mL GADAVIST  GADOBUTROL  1 MMOL/ML IV SOLN COMPARISON:  Comparison made with prior study from 02/13/2021. FINDINGS: MRI HEAD FINDINGS Brain: Cerebral volume within normal limits. Patchy T2/FLAIR hyperintensity involving the periventricular deep white matter both cerebral hemispheres, consistent with chronic  small vessel ischemic disease, moderate in nature. Small remote right cerebellar infarct noted (series 10, image 8). No evidence for acute or subacute infarct. No areas of chronic cortical infarction. No acute intracranial hemorrhage. Multiple scattered chronic micro hemorrhages noted, most pronounced about the cerebellum, likely hypertensive in nature. No mass lesion, midline shift or mass effect. No hydrocephalus or extra-axial fluid collection. Pituitary gland within normal limits. Vascular: Major intracranial vascular flow voids are maintained. Skull and upper cervical spine: Craniocervical junction within normal limits. Bone marrow signal intensity overall within normal limits. Degenerative reactive endplate changes noted about the visualized C5-6 interspace. Hyperostosis frontalis interna noted. No scalp soft tissue abnormality. Sinuses/Orbits: Prior bilateral ocular lens replacement. Paranasal sinuses are largely clear. Trace left mastoid effusion noted, of doubtful significance. Other: None. MRA HEAD FINDINGS Anterior circulation: Both internal carotid arteries widely patent to the siphons to the termini without stenosis or other abnormality. A1 segments, anterior communicating artery complex common anterior cerebral arteries patent without stenosis. No M1 stenosis or occlusion. No proximal MCA branch occlusion. Distal MCA branches perfused and symmetric. Posterior circulation: Both V4 segments patent without stenosis. Left vertebral artery slightly dominant. Both PICA patent. Basilar patent without stenosis. Superior cerebral arteries patent bilaterally. Right PCA supplied via the basilar as well as a prominent right posterior communicating artery. Fetal type origin left PCA. Both PCAs patent to their distal aspects without stenosis. Anatomic variants: As above.  No aneurysm. MRA NECK FINDINGS Aortic arch: Examination moderately degraded by motion artifact. Visualized aortic arch within normal limits for  caliber with standard branch pattern. No stenosis about the origin of the great vessels. Right carotid system: Right common and internal carotid arteries are patent with antegrade flow. No visible dissection. No hemodynamically significant stenosis about the right carotid artery system. Left carotid system: Left common and internal carotid arteries are patent with antegrade flow. No visible dissection. No hemodynamically significant stenosis about the left carotid artery system. Vertebral arteries: Neither vertebral artery origin visualized on this exam. Visualized portions of vertebral arteries patent with antegrade flow. No stenosis or dissection. Other: None IMPRESSION: MRI HEAD: 1. No acute intracranial abnormality. 2. Moderate chronic microvascular ischemic disease. 3. Small remote right cerebellar infarct. MRA HEAD: Negative intracranial MRA for large vessel occlusion or other emergent finding. No hemodynamically significant or correctable stenosis. MRA NECK: 1. Negative MRA of the neck for large vessel occlusion or other emergent finding. No hemodynamically significant stenosis about either carotid artery system. 2. Wide patency of both vertebral arteries within the neck. Electronically Signed   By: Morene Hoard M.D.   On: 07/03/2024 23:06    Microbiology: Results for orders placed or performed during the hospital encounter of 07/04/24  SARS Coronavirus 2 by RT PCR (hospital order, performed in [facility] hospital lab) *cepheid single result test* Anterior Nasal Swab  Status: None   Collection Time: 07/05/24  7:45 AM   Specimen: Anterior Nasal Swab  Result Value Ref Range Status   SARS Coronavirus 2 by RT PCR NEGATIVE NEGATIVE Final    Comment: Performed at [facility], [address] [place], [place] [address]  Respiratory (~20 pathogens) panel by PCR     Status: None   Collection Time: 07/05/24  1:58 PM   Specimen: Nasopharyngeal Swab; Respiratory  Result Value Ref Range  Status   Adenovirus NOT DETECTED NOT DETECTED Final   Coronavirus 229E NOT DETECTED NOT DETECTED Final    Comment: (NOTE) The Coronavirus on the Respiratory Panel, DOES NOT test for the novel  Coronavirus (2019 nCoV)    Coronavirus HKU1 NOT DETECTED NOT DETECTED Final   Coronavirus NL63 NOT DETECTED NOT DETECTED Final   Coronavirus OC43 NOT DETECTED NOT DETECTED Final   Metapneumovirus NOT DETECTED NOT DETECTED Final   Rhinovirus / Enterovirus NOT DETECTED NOT DETECTED Final   Influenza A NOT DETECTED NOT DETECTED Final   Influenza B NOT DETECTED NOT DETECTED Final   Parainfluenza Virus 1 NOT DETECTED NOT DETECTED Final   Parainfluenza Virus 2 NOT DETECTED NOT DETECTED Final   Parainfluenza Virus 3 NOT DETECTED NOT DETECTED Final   Parainfluenza Virus 4 NOT DETECTED NOT DETECTED Final   Respiratory Syncytial Virus NOT DETECTED NOT DETECTED Final   Bordetella pertussis NOT DETECTED NOT DETECTED Final   Bordetella Parapertussis NOT DETECTED NOT DETECTED Final   Chlamydophila pneumoniae NOT DETECTED NOT DETECTED Final   Mycoplasma pneumoniae NOT DETECTED NOT DETECTED Final    Comment: Performed at [facility], [address], [place], [place] [address]    Labs: CBC: Recent Labs  Lab 07/03/24 1840 07/04/24 1820  WBC 6.9 9.1  NEUTROABS 5.1  --   HGB 12.9 13.0  HCT 40.6 40.2  MCV 88.6 86.5  PLT 233 240   Basic Metabolic Panel: Recent Labs  Lab 07/03/24 1840 07/04/24 1820 07/06/24 1046  NA 134* 131* 132*  K 4.2 3.5 3.3*  CL 97* 93* 95*  CO2 25 27 26   GLUCOSE 127* 129* 109*  BUN 15 13 14   CREATININE 0.62 0.63 0.52  CALCIUM 9.5 9.5 9.1   Liver Function Tests: Recent Labs  Lab 07/03/24 1840 07/04/24 1820  AST 23 23  ALT 15 17  ALKPHOS 80 73  BILITOT 0.8 0.8  PROT 6.6 6.8  ALBUMIN 3.7 3.8   CBG: Recent Labs  Lab 07/04/24 1842  GLUCAP 116*    Discharge time spent: {LESS THAN/GREATER THAN:26388} 30 minutes.  Signed: Concepcion Riser, MD [facility] 07/06/2024

## 2024-07-06 NOTE — Care Management CC44 (Signed)
 Condition Code 44 Documentation Completed  Patient Details  Name: Ann Conley MRN: [id number] Date of Birth: 1933/12/17   Condition Code 44 given:  Yes Patient signature on Condition Code 44 notice:  Yes Documentation of 2 MD's agreement:  Yes Code 44 added to claim:  Yes    Marval Gell, RN 07/06/2024, 9:06 AM

## 2024-07-06 NOTE — Care Management Obs Status (Signed)
 MEDICARE OBSERVATION STATUS NOTIFICATION   Patient Details  Name: Ann Conley MRN: [id number] Date of Birth: 1934-07-22   Medicare Observation Status Notification Given:  Yes    Marval Gell, RN 07/06/2024, 9:06 AM

## 2024-07-06 NOTE — TOC Initial Note (Addendum)
 Transition of Care [facility]) - Initial/Assessment Note    Patient Details  Name: Ann Conley MRN: [id number] Date of Birth: 18-Oct-1934  [facility] CM/SW Contact:    Marval Gell, RN Phone Number: 07/06/2024, 9:45 AM  Clinical Narrative:                  Spoke w patient at bedside. She states she would like HH services, no preference, [facility] able to accept Patient will need orders and face to face for [facility] PT OT SLP. Patient states that she has a RW at home and declines 3/1 also stating her shower is too small to use it for a chair  Expected Discharge Plan: Home w Home Health Services Barriers to Discharge: Continued Medical Work up   Patient Goals and CMS Choice Patient states their goals for this hospitalization and ongoing recovery are:: to go home CMS Medicare.gov Compare Post Acute Care list provided to:: Patient Choice offered to / list presented to : Patient      Expected Discharge Plan and Services   Discharge Planning Services: CM Consult Post Acute Care Choice: Home Health Living arrangements for the past 2 months: Single Family Home                 DME Arranged: N/A         HH Arranged: PT, OT, Speech Therapy HH Agency- [facility]  Date HH Agency Contacted: 07/06/24 Time HH Agency Contacted: 0945 Cory   Prior Living Arrangements/Services Living arrangements for the past 2 months: Single Family Home                     Activities of Daily Living   ADL Screening (condition at time of admission) Independently performs ADLs?: No Does the patient have a NEW difficulty with bathing/dressing/toileting/self-feeding that is expected to last >3 days?: Yes (Initiates electronic notice to provider for possible OT consult) Does the patient have a NEW difficulty with getting in/out of bed, walking, or climbing stairs that is expected to last >3 days?: Yes (Initiates electronic notice to provider for possible PT consult) Does the patient have a NEW  difficulty with communication that is expected to last >3 days?: No Is the patient deaf or have difficulty hearing?: Yes Does the patient have difficulty seeing, even when wearing glasses/contacts?: No Does the patient have difficulty concentrating, remembering, or making decisions?: Yes  Permission Sought/Granted                  Emotional Assessment              Admission diagnosis:  Delirium [R41.0] Acute metabolic encephalopathy [G93.41] Patient Active Problem List   Diagnosis Date Noted   Altered mental status 07/05/2024   Acute metabolic encephalopathy 07/05/2024   Stasis dermatitis of both legs 06/23/2024   Declining functional status 06/23/2024   Bilateral leg edema 05/27/2024   Shingles 05/16/2024   Primary osteoarthritis of left knee 12/14/2022   ACE-inhibitor cough 01/03/2022   Primary stabbing headache 09/11/2018   Primary osteoarthritis of both knees 06/01/2017   Burning mouth syndrome 04/13/2016   Bleeding hemorrhoid 04/13/2016   Complicated migraine 02/21/2016   Keratoma 10/19/2014   Pain in lower limb 10/19/2014   Hammer toe of left foot 10/19/2014   Morbid obesity (HCC) 09/17/2014   Snoring 03/13/2013   Left anterior knee pain 03/13/2013   Skin mole 09/18/2011   Migraine with aura 05/07/2011   Essential hypertension 04/25/2011  GERD (gastroesophageal reflux disease) 04/25/2011   Annual physical exam 04/25/2011   BREAST CANCER, HX OF 06/21/2007   PCP:  Mahlon Comer BRAVO, MD Pharmacy:   [facility] [address] [place] [address] Phone: [phone] Fax: [phone]     Social Drivers of Health (SDOH) Social History: SDOH Screenings   Food Insecurity: No Food Insecurity (07/05/2024)  Housing: Low Risk  (07/05/2024)  Transportation Needs: No Transportation Needs (07/05/2024)  Utilities: Not At Risk (07/05/2024)  Alcohol Screen: Low Risk  (01/08/2024)  Depression (PHQ2-9): Low Risk  (04/01/2024)   Financial Resource Strain: Low Risk  (01/08/2024)  Physical Activity: Inactive (01/08/2024)  Social Connections: Unknown (07/05/2024)  Stress: No Stress Concern Present (01/08/2024)  Tobacco Use: Low Risk  (07/04/2024)  Health Literacy: Adequate Health Literacy (01/08/2024)   SDOH Interventions:     Readmission Risk Interventions     No data to display

## 2024-07-06 NOTE — Plan of Care (Signed)
  Problem: Education: Goal: Knowledge of disease or condition will improve Outcome: Progressing   Problem: Self-Care: Goal: Ability to communicate needs accurately will improve Outcome: Progressing   Problem: Clinical Measurements: Goal: Will remain free from infection Outcome: Progressing Goal: Respiratory complications will improve Outcome: Progressing   Problem: Safety: Goal: Non-violent Restraint(s) Outcome: Completed/Met

## 2024-07-07 ENCOUNTER — Telehealth: Payer: Self-pay

## 2024-07-07 NOTE — Transitions of Care (Post Inpatient/ED Visit) (Unsigned)
   07/07/2024  Name: Ann Conley MRN: [id number] DOB: 09-23-1934  Today's TOC FU Call Status: Today's TOC FU Call Status:: Unsuccessful Call (1st Attempt) Unsuccessful Call (1st Attempt) Date: 07/07/24  Attempted to reach the patient regarding the most recent Inpatient/ED visit.  Follow Up Plan: Additional outreach attempts will be made to reach the patient to complete the Transitions of Care (Post Inpatient/ED visit) call.   Signature Julian Lemmings, LPN [facility] Nurse Health Advisor Direct Dial [phone]

## 2024-07-08 ENCOUNTER — Other Ambulatory Visit (HOSPITAL_COMMUNITY): Payer: Self-pay

## 2024-07-08 ENCOUNTER — Telehealth: Payer: Self-pay

## 2024-07-08 NOTE — Telephone Encounter (Signed)
 Error-Voicemail

## 2024-07-08 NOTE — Telephone Encounter (Unsigned)
 Copied from CRM [id number]. Topic: General - Other >> Jul 08, 2024 12:55 PM Rosina BIRCH wrote: Reason for CRM: patient daughter would like to be called concerning the patient blood pressure [phone]

## 2024-07-08 NOTE — Transitions of Care (Post Inpatient/ED Visit) (Signed)
   07/08/2024  Name: Ann Conley MRN: [id number] DOB: 29-Apr-1934  Today's TOC FU Call Status: Today's TOC FU Call Status:: Successful TOC FU Call Completed Unsuccessful Call (1st Attempt) Date: 07/07/24 [facility] FU Call Complete Date: 07/08/24 Patient's Name and Date of Birth confirmed.  Transition Care Management Follow-up Telephone Call Date of Discharge: 07/07/24 Discharge Facility: [facility]) Type of Discharge: Inpatient Admission Primary Inpatient Discharge Diagnosis:: met encephalopathy How have you been since you were released from the hospital?: Better Any questions or concerns?: No  Items Reviewed: Did you receive and understand the discharge instructions provided?: Yes Medications obtained,verified, and reconciled?: Yes (Medications Reviewed) Any new allergies since your discharge?: No Dietary orders reviewed?: Yes Do you have support at home?: Yes People in Home [RPT]: child(ren), adult  Medications Reviewed Today: Medications Reviewed Today     Reviewed by Emmitt Pan, LPN (Licensed Practical Nurse) on 07/08/24 at 1231  Med List Status: <None>   Medication Order Taking? Sig Documenting Provider Last Dose Status Informant  amLODipine  (NORVASC ) 10 MG tablet [id number] Yes Take 1 tablet (10 mg total) by mouth daily. Darci Pore, MD  Active   aspirin  81 MG chewable tablet [id number] Yes Chew 1 tablet (81 mg total) by mouth daily. Horton, Kristie M, DO  Active   ibuprofen  (ADVIL ) 800 MG tablet [id number] Yes Take 1 tablet (800 mg total) by mouth every 8 (eight) hours as needed for headache. [name], Katherine E, MD  Active   Magnesium 250 MG TABS [id number] Yes Take 250 mg by mouth as needed. [provider]  Active   Vitamin D , Ergocalciferol , (DRISDOL ) 1.25 MG (50000 UNIT) CAPS capsule [id number] Yes Take 1 capsule (50,000 Units total) by mouth every 7 (seven) days. Mahlon Comer BRAVO, MD  Active             Home Care and  Equipment/Supplies: Were Home Health Services Ordered?: Yes Name of Home Health Agency:: unknown Has Agency set up a time to come to your home?: No Any new equipment or medical supplies ordered?: NA  Functional Questionnaire: Do you need assistance with bathing/showering or dressing?: No Do you need assistance with meal preparation?: No Do you need assistance with eating?: No Do you have difficulty maintaining continence: No Do you need assistance with getting out of bed/getting out of a chair/moving?: No Do you have difficulty managing or taking your medications?: No  Follow up appointments reviewed: PCP Follow-up appointment confirmed?: Yes Date of PCP follow-up appointment?: 07/10/24 Follow-up Provider: [facility] Follow-up appointment confirmed?: NA Do you need transportation to your follow-up appointment?: No Do you understand care options if your condition(s) worsen?: Yes-patient verbalized understanding    SIGNATURE Pan Emmitt, LPN [facility] Nurse Health Advisor Direct Dial [phone]

## 2024-07-08 NOTE — Telephone Encounter (Signed)
 Left voice -No patient information to call Dr.[name] office back.

## 2024-07-09 ENCOUNTER — Telehealth: Payer: Self-pay

## 2024-07-09 NOTE — Telephone Encounter (Signed)
 Amlodipine  is the only BP medication listed in her chart (they made changes while she was hospitalized).  She should be taking this medicine daily per her d/c summary.  We can review her BP and medications in office tomorrow

## 2024-07-09 NOTE — Telephone Encounter (Signed)
 Patient is not taking any BP medications. She should be taking Amlodipine  according to chart. Patient has appointment with Dr. Mahlon tomorrow to discuss

## 2024-07-09 NOTE — Telephone Encounter (Signed)
 Called back liz and left vm to return call. Not much was given in the message below. Unsure what they are calling about.

## 2024-07-09 NOTE — Telephone Encounter (Signed)
 Copied from CRM #[id number]. Topic: Clinical - Home Health Verbal Orders >> Jul 09, 2024 11:50 AM Richerd A wrote: Caller/Agency: [facility] Number: [phone] Service Requested: Skilled Nursing and PT Frequency: N/A Any new concerns about the patient? No

## 2024-07-10 ENCOUNTER — Encounter: Payer: Self-pay | Admitting: Family Medicine

## 2024-07-10 ENCOUNTER — Ambulatory Visit: Admitting: Student in an Organized Health Care Education/Training Program

## 2024-07-10 ENCOUNTER — Telehealth: Payer: Self-pay | Admitting: Family Medicine

## 2024-07-10 ENCOUNTER — Ambulatory Visit (INDEPENDENT_AMBULATORY_CARE_PROVIDER_SITE_OTHER): Admitting: Family Medicine

## 2024-07-10 VITALS — BP 142/62 | HR 80 | Temp 98.1°F | Resp 18 | Ht 59.0 in | Wt 228.6 lb

## 2024-07-10 DIAGNOSIS — R4182 Altered mental status, unspecified: Secondary | ICD-10-CM

## 2024-07-10 DIAGNOSIS — I10 Essential (primary) hypertension: Secondary | ICD-10-CM

## 2024-07-10 DIAGNOSIS — G8929 Other chronic pain: Secondary | ICD-10-CM | POA: Diagnosis not present

## 2024-07-10 DIAGNOSIS — R413 Other amnesia: Secondary | ICD-10-CM

## 2024-07-10 DIAGNOSIS — G4733 Obstructive sleep apnea (adult) (pediatric): Secondary | ICD-10-CM

## 2024-07-10 LAB — BASIC METABOLIC PANEL WITH GFR
BUN: 20 mg/dL (ref 6–23)
CO2: 31 meq/L (ref 19–32)
Calcium: 9.4 mg/dL (ref 8.4–10.5)
Chloride: 96 meq/L (ref 96–112)
Creatinine, Ser: 0.56 mg/dL (ref 0.40–1.20)
GFR: 81.88 mL/min (ref >60.00)
Glucose, Bld: 106 mg/dL — ABNORMAL HIGH (ref 70–99)
Potassium: 3.3 meq/L — ABNORMAL LOW (ref 3.5–5.1)
Sodium: 138 meq/L (ref 135–145)

## 2024-07-10 NOTE — Telephone Encounter (Signed)
 Obtained forms and placed in providers folder at nurse station

## 2024-07-10 NOTE — Telephone Encounter (Signed)
 [facility] called back and I gave her the verbal okay for [facility] and PT per okay from Dr. Mahlon.

## 2024-07-10 NOTE — Patient Instructions (Signed)
 Follow up in 4 weeks to recheck blood pressure We'll notify you of your lab results and make any changes if needed CONTINUE the Amlodipine  for now STOP the Chlorthalidone  We'll call you to schedule your Neurology and sleep evaluations It's time to stop driving- for your safety and those around you Accept any and all help! Call with any questions or concerns Hang in there!

## 2024-07-10 NOTE — Progress Notes (Signed)
   Subjective:    Patient ID: Ann Conley, female    DOB: July 09, 1934, 89 y.o.   MRN: [id number]  HPI Hospital f/u- pt was admitted 8/29-31 after presenting w/ confusion.  She went to the ER on 8/28 w/ aphasia and weakness and had a normal MRI but she refused admission for complete TIA work up.  The next day, she returned more confused.  Neuro recommended an EEG which was unremarkable and there was no evidence of infxn.  Mental status returned to baseline.  They were advised to get an outpt sleep study (due to noncompliance w/ CPAP) and possibly a dementia work up.  Chlorthalidone  was changed to Amlodipine  due to hyponatremia and hypokalemia.  Today pt is concerned about memory.  Family is recommending a move to [place] due to husband's ongoing memory and health issues.  Family reports pt is 'more subdued, more forgetful'.  Pt admits she is having a hard time answering questions.     Review of Systems For ROS see HPI     Objective:   Physical Exam Vitals reviewed.  Constitutional:      General: She is not in acute distress.    Appearance: Normal appearance. She is well-developed. She is obese. She is not ill-appearing.  HENT:     Head: Normocephalic and atraumatic.  Eyes:     Conjunctiva/sclera: Conjunctivae normal.     Pupils: Pupils are equal, round, and reactive to light.  Neck:     Thyroid : No thyromegaly.  Cardiovascular:     Rate and Rhythm: Normal rate and regular rhythm.     Heart sounds: Normal heart sounds. No murmur heard. Pulmonary:     Effort: Pulmonary effort is normal. No respiratory distress.     Breath sounds: Normal breath sounds.  Abdominal:     General: There is no distension.     Palpations: Abdomen is soft.     Tenderness: There is no abdominal tenderness.  Musculoskeletal:     Cervical back: Normal range of motion and neck supple.  Lymphadenopathy:     Cervical: No cervical adenopathy.  Skin:    General: Skin is warm and dry.  Neurological:      General: No focal deficit present.     Mental Status: She is alert and oriented to person, place, and time.  Psychiatric:        Behavior: Behavior normal.     Comments: anxious           Assessment & Plan:

## 2024-07-10 NOTE — Telephone Encounter (Signed)
 Home Health Certification or Plan of Care Tracking  Is this a Certification or Plan of Care? Plan of Care  [facility] Agency: [facility]  Order Number:  [id number]  Has charge sheet been attached? Yes  Where has form been placed:   Labeled & placed in provider bin

## 2024-07-11 ENCOUNTER — Ambulatory Visit: Payer: Self-pay | Admitting: Family Medicine

## 2024-07-11 ENCOUNTER — Other Ambulatory Visit: Payer: Self-pay

## 2024-07-11 ENCOUNTER — Other Ambulatory Visit (HOSPITAL_COMMUNITY): Payer: Self-pay

## 2024-07-11 DIAGNOSIS — E876 Hypokalemia: Secondary | ICD-10-CM

## 2024-07-11 DIAGNOSIS — Z8639 Personal history of other endocrine, nutritional and metabolic disease: Secondary | ICD-10-CM

## 2024-07-11 MED ORDER — POTASSIUM CHLORIDE CRYS ER 20 MEQ PO TBCR
20.0000 meq | EXTENDED_RELEASE_TABLET | Freq: Every day | ORAL | 1 refills | Status: DC
  Filled 2024-07-11: qty 30, 30d supply, fill #0
  Filled 2024-08-03: qty 30, 30d supply, fill #1

## 2024-07-12 DIAGNOSIS — E871 Hypo-osmolality and hyponatremia: Secondary | ICD-10-CM | POA: Diagnosis not present

## 2024-07-12 DIAGNOSIS — K219 Gastro-esophageal reflux disease without esophagitis: Secondary | ICD-10-CM | POA: Diagnosis not present

## 2024-07-12 DIAGNOSIS — Z7982 Long term (current) use of aspirin: Secondary | ICD-10-CM | POA: Diagnosis not present

## 2024-07-12 DIAGNOSIS — I10 Essential (primary) hypertension: Secondary | ICD-10-CM | POA: Diagnosis not present

## 2024-07-12 DIAGNOSIS — I69320 Aphasia following cerebral infarction: Secondary | ICD-10-CM | POA: Diagnosis not present

## 2024-07-12 DIAGNOSIS — M6281 Muscle weakness (generalized): Secondary | ICD-10-CM | POA: Diagnosis not present

## 2024-07-12 DIAGNOSIS — Z853 Personal history of malignant neoplasm of breast: Secondary | ICD-10-CM | POA: Diagnosis not present

## 2024-07-12 DIAGNOSIS — G4733 Obstructive sleep apnea (adult) (pediatric): Secondary | ICD-10-CM | POA: Diagnosis not present

## 2024-07-12 DIAGNOSIS — G43109 Migraine with aura, not intractable, without status migrainosus: Secondary | ICD-10-CM | POA: Diagnosis not present

## 2024-07-12 DIAGNOSIS — E876 Hypokalemia: Secondary | ICD-10-CM | POA: Diagnosis not present

## 2024-07-12 DIAGNOSIS — M1990 Unspecified osteoarthritis, unspecified site: Secondary | ICD-10-CM | POA: Diagnosis not present

## 2024-07-12 DIAGNOSIS — R262 Difficulty in walking, not elsewhere classified: Secondary | ICD-10-CM | POA: Diagnosis not present

## 2024-07-12 DIAGNOSIS — G9341 Metabolic encephalopathy: Secondary | ICD-10-CM | POA: Diagnosis not present

## 2024-07-14 NOTE — Telephone Encounter (Signed)
 Forms signed and returned to [facility]

## 2024-07-14 NOTE — Telephone Encounter (Signed)
 Faxed back.

## 2024-07-17 DIAGNOSIS — Z7982 Long term (current) use of aspirin: Secondary | ICD-10-CM | POA: Diagnosis not present

## 2024-07-17 DIAGNOSIS — G9341 Metabolic encephalopathy: Secondary | ICD-10-CM | POA: Diagnosis not present

## 2024-07-17 DIAGNOSIS — E871 Hypo-osmolality and hyponatremia: Secondary | ICD-10-CM | POA: Diagnosis not present

## 2024-07-17 DIAGNOSIS — M1990 Unspecified osteoarthritis, unspecified site: Secondary | ICD-10-CM | POA: Diagnosis not present

## 2024-07-17 DIAGNOSIS — G43109 Migraine with aura, not intractable, without status migrainosus: Secondary | ICD-10-CM | POA: Diagnosis not present

## 2024-07-17 DIAGNOSIS — I10 Essential (primary) hypertension: Secondary | ICD-10-CM | POA: Diagnosis not present

## 2024-07-17 DIAGNOSIS — Z853 Personal history of malignant neoplasm of breast: Secondary | ICD-10-CM | POA: Diagnosis not present

## 2024-07-17 DIAGNOSIS — K219 Gastro-esophageal reflux disease without esophagitis: Secondary | ICD-10-CM | POA: Diagnosis not present

## 2024-07-17 DIAGNOSIS — G4733 Obstructive sleep apnea (adult) (pediatric): Secondary | ICD-10-CM | POA: Diagnosis not present

## 2024-07-17 DIAGNOSIS — E876 Hypokalemia: Secondary | ICD-10-CM | POA: Diagnosis not present

## 2024-07-17 DIAGNOSIS — M6281 Muscle weakness (generalized): Secondary | ICD-10-CM | POA: Diagnosis not present

## 2024-07-17 DIAGNOSIS — R262 Difficulty in walking, not elsewhere classified: Secondary | ICD-10-CM | POA: Diagnosis not present

## 2024-07-17 DIAGNOSIS — I69320 Aphasia following cerebral infarction: Secondary | ICD-10-CM | POA: Diagnosis not present

## 2024-07-18 ENCOUNTER — Telehealth: Payer: Self-pay

## 2024-07-18 NOTE — Telephone Encounter (Signed)
 [employer] forms received for provider review and sig. Placed in provider bin at the front desk

## 2024-07-18 NOTE — Telephone Encounter (Signed)
 Ok for verbal orders ?

## 2024-07-18 NOTE — Telephone Encounter (Signed)
 Obtained forms from front desk and placed in provider folder at nurse station

## 2024-07-18 NOTE — Telephone Encounter (Signed)
 Copied from CRM [id number]. Topic: Clinical - Home Health Verbal Orders >> Jul 17, 2024  5:53 PM Drema MATSU wrote: Caller/Agency: [facility]  Callback Number: [phone] Service Requested: Physical Therapy Frequency: 1 week 8 Any new concerns about the patient? No

## 2024-07-18 NOTE — Telephone Encounter (Signed)
 Called back and LM with verbal orders as requested

## 2024-07-21 ENCOUNTER — Ambulatory Visit: Payer: Self-pay

## 2024-07-21 NOTE — Telephone Encounter (Signed)
 FYI Only or Action Required?: FYI only for provider.  Patient was last seen in primary care on 07/10/2024 by Mahlon Comer BRAVO, MD.  Called Nurse Triage reporting Shoulder Pain and Headache.  Symptoms began several years ago.  Interventions attempted: OTC medications: ibuprofen .  Symptoms are: gradually worsening.  Triage Disposition: See PCP When Office is Open (Within 3 Days)  Patient/caregiver understands and will follow disposition?: Yes Reason for Disposition  [1] MODERATE pain (e.g., interferes with normal activities) AND [2] present > 3 days  Answer Assessment - Initial Assessment Questions Reports right shoulder pain that is chronic. Also reports chronic ice pick headaches, had one yesterday. Denies headache today.   1. ONSET: When did the pain start?     I am 89, don't ask, hx of arthritis  2. LOCATION: Where is the pain located?     Right shoulder  3. PAIN: How bad is the pain? (Scale 1-10; or mild, moderate, severe)     Varies  5. CAUSE: What do you think is causing the shoulder pain?     Arthritis  Protocols used: Shoulder Pain-A-AH Copied from CRM [id number]. Topic: Clinical - Red Word Triage >> Jul 21, 2024 11:35 AM [place] J wrote: [facility] that prompted transfer to Nurse Triage: Right shoulder pain and intense ice pick feeling on her head that was painful.

## 2024-07-21 NOTE — Telephone Encounter (Signed)
 If she is having 'ice pick' feelings in her head, she needs to reach out to Neuro as she sees them for her migraines

## 2024-07-21 NOTE — Telephone Encounter (Signed)
 Pts daughter states they have reached out to Neuro and she has not got back in contact with them I advise to try one more time.

## 2024-07-21 NOTE — Telephone Encounter (Signed)
 fyi

## 2024-07-23 ENCOUNTER — Ambulatory Visit (INDEPENDENT_AMBULATORY_CARE_PROVIDER_SITE_OTHER): Admitting: Family Medicine

## 2024-07-23 ENCOUNTER — Other Ambulatory Visit: Payer: Self-pay

## 2024-07-23 ENCOUNTER — Other Ambulatory Visit (HOSPITAL_COMMUNITY): Payer: Self-pay

## 2024-07-23 ENCOUNTER — Encounter: Payer: Self-pay | Admitting: Family Medicine

## 2024-07-23 VITALS — BP 142/62 | HR 68 | Temp 98.0°F | Ht 59.0 in | Wt 237.0 lb

## 2024-07-23 DIAGNOSIS — R600 Localized edema: Secondary | ICD-10-CM | POA: Diagnosis not present

## 2024-07-23 DIAGNOSIS — G4733 Obstructive sleep apnea (adult) (pediatric): Secondary | ICD-10-CM | POA: Diagnosis not present

## 2024-07-23 DIAGNOSIS — G43109 Migraine with aura, not intractable, without status migrainosus: Secondary | ICD-10-CM

## 2024-07-23 MED ORDER — IBUPROFEN 800 MG PO TABS
800.0000 mg | ORAL_TABLET | Freq: Three times a day (TID) | ORAL | 1 refills | Status: AC | PRN
  Filled 2024-07-23: qty 90, 30d supply, fill #0

## 2024-07-23 MED ORDER — FUROSEMIDE 20 MG PO TABS
20.0000 mg | ORAL_TABLET | Freq: Every day | ORAL | 3 refills | Status: DC | PRN
  Filled 2024-07-23: qty 30, 30d supply, fill #0

## 2024-07-23 NOTE — Progress Notes (Signed)
   Subjective:    Patient ID: Ann Conley, female    DOB: 1934/02/17, 89 y.o.   MRN: [id number]  HPI Shoulder pain- occurred 2 nights ago.  Resolved.  LE edema- pt has considerable swelling of her legs bilaterally.  Up 9 lbs in 2 weeks.  No change in diet but eating soup, ham, sausage, bacon.  Has R foot numbness.  She reports swelling became much more notable since starting Amlodipine   New neurology referral.  Not able to drive and relies on transportation.  Needs [facility] provider as this is much closer for her.   Review of Systems For ROS see HPI     Objective:   Physical Exam Vitals reviewed.  Constitutional:      General: She is not in acute distress.    Appearance: Normal appearance. She is not ill-appearing.  HENT:     Head: Normocephalic and atraumatic.  Eyes:     Extraocular Movements: Extraocular movements intact.     Conjunctiva/sclera: Conjunctivae normal.  Cardiovascular:     Rate and Rhythm: Normal rate and regular rhythm.     Pulses: Normal pulses.     Heart sounds: Normal heart sounds. No murmur heard. Pulmonary:     Effort: Pulmonary effort is normal. No respiratory distress.     Breath sounds: No wheezing, rhonchi or rales.  Musculoskeletal:     Cervical back: Neck supple.     Right lower leg: Edema (1-2+ pitting edema) present.     Left lower leg: Edema (1-2+ pitting edema) present.  Lymphadenopathy:     Cervical: No cervical adenopathy.  Skin:    General: Skin is warm and dry.  Neurological:     Mental Status: She is alert. Mental status is at baseline.  Psychiatric:        Mood and Affect: Mood normal.        Behavior: Behavior normal.        Thought Content: Thought content normal.           Assessment & Plan:

## 2024-07-23 NOTE — Patient Instructions (Signed)
 Follow up in 2 weeks to recheck blood pressure We'll notify you of your lab results and make any changes if needed We'll call you to schedule your Neurology appt and the ultrasound of your heart STOP the Amlodipine  START the Furosemide  daily until swelling improves and then as needed Call with any questions or concerns Hang in there!!!

## 2024-07-24 ENCOUNTER — Ambulatory Visit: Payer: Self-pay | Admitting: Family Medicine

## 2024-07-24 DIAGNOSIS — I10 Essential (primary) hypertension: Secondary | ICD-10-CM | POA: Diagnosis not present

## 2024-07-24 DIAGNOSIS — Z853 Personal history of malignant neoplasm of breast: Secondary | ICD-10-CM | POA: Diagnosis not present

## 2024-07-24 DIAGNOSIS — I517 Cardiomegaly: Secondary | ICD-10-CM

## 2024-07-24 DIAGNOSIS — M6281 Muscle weakness (generalized): Secondary | ICD-10-CM | POA: Diagnosis not present

## 2024-07-24 DIAGNOSIS — G4733 Obstructive sleep apnea (adult) (pediatric): Secondary | ICD-10-CM | POA: Diagnosis not present

## 2024-07-24 DIAGNOSIS — Z7982 Long term (current) use of aspirin: Secondary | ICD-10-CM | POA: Diagnosis not present

## 2024-07-24 DIAGNOSIS — R262 Difficulty in walking, not elsewhere classified: Secondary | ICD-10-CM | POA: Diagnosis not present

## 2024-07-24 DIAGNOSIS — K219 Gastro-esophageal reflux disease without esophagitis: Secondary | ICD-10-CM | POA: Diagnosis not present

## 2024-07-24 DIAGNOSIS — G43109 Migraine with aura, not intractable, without status migrainosus: Secondary | ICD-10-CM | POA: Diagnosis not present

## 2024-07-24 DIAGNOSIS — E876 Hypokalemia: Secondary | ICD-10-CM | POA: Diagnosis not present

## 2024-07-24 DIAGNOSIS — E871 Hypo-osmolality and hyponatremia: Secondary | ICD-10-CM | POA: Diagnosis not present

## 2024-07-24 DIAGNOSIS — M1990 Unspecified osteoarthritis, unspecified site: Secondary | ICD-10-CM | POA: Diagnosis not present

## 2024-07-24 DIAGNOSIS — I69320 Aphasia following cerebral infarction: Secondary | ICD-10-CM | POA: Diagnosis not present

## 2024-07-24 DIAGNOSIS — G9341 Metabolic encephalopathy: Secondary | ICD-10-CM | POA: Diagnosis not present

## 2024-07-24 LAB — HEPATIC FUNCTION PANEL
ALT: 14 U/L (ref 0–35)
AST: 17 U/L (ref 0–37)
Albumin: 4.3 g/dL (ref 3.5–5.2)
Alkaline Phosphatase: 96 U/L (ref 39–117)
Bilirubin, Direct: -0.1 mg/dL — ABNORMAL LOW (ref 0.0–0.3)
Total Bilirubin: 0.3 mg/dL (ref 0.2–1.2)
Total Protein: 7.3 g/dL (ref 6.0–8.3)

## 2024-07-24 LAB — CBC WITH DIFFERENTIAL/PLATELET
Basophils Absolute: 0.1 K/uL (ref 0.0–0.1)
Basophils Relative: 1.1 % (ref 0.0–3.0)
Eosinophils Absolute: 0.2 K/uL (ref 0.0–0.7)
Eosinophils Relative: 2.3 % (ref 0.0–5.0)
HCT: 39.3 % (ref 36.0–46.0)
Hemoglobin: 12.9 g/dL (ref 12.0–15.0)
Lymphocytes Relative: 20 % (ref 12.0–46.0)
Lymphs Abs: 1.3 K/uL (ref 0.7–4.0)
MCHC: 32.7 g/dL (ref 30.0–36.0)
MCV: 85.6 fl (ref 78.0–100.0)
Monocytes Absolute: 0.5 K/uL (ref 0.1–1.0)
Monocytes Relative: 8.2 % (ref 3.0–12.0)
Neutro Abs: 4.5 K/uL (ref 1.4–7.7)
Neutrophils Relative %: 68.4 % (ref 43.0–77.0)
Platelets: 277 K/uL (ref 150.0–400.0)
RBC: 4.59 Mil/uL (ref 3.87–5.11)
RDW: 15.3 % (ref 11.5–15.5)
WBC: 6.6 K/uL (ref 4.0–10.5)

## 2024-07-24 LAB — BASIC METABOLIC PANEL WITH GFR
BUN: 19 mg/dL (ref 6–23)
CO2: 30 meq/L (ref 19–32)
Calcium: 9.8 mg/dL (ref 8.4–10.5)
Chloride: 98 meq/L (ref 96–112)
Creatinine, Ser: 0.54 mg/dL (ref 0.40–1.20)
GFR: 82.58 mL/min (ref >60.00)
Glucose, Bld: 103 mg/dL — ABNORMAL HIGH (ref 70–99)
Potassium: 4.6 meq/L (ref 3.5–5.1)
Sodium: 134 meq/L — ABNORMAL LOW (ref 135–145)

## 2024-07-24 LAB — BRAIN NATRIURETIC PEPTIDE: Pro B Natriuretic peptide (BNP): 91 pg/mL (ref 0.0–100.0)

## 2024-07-24 NOTE — Telephone Encounter (Signed)
 Form completed and returned to [place]

## 2024-07-24 NOTE — Progress Notes (Signed)
 Called patient to relay lab results. Left vm to return call

## 2024-07-24 NOTE — Telephone Encounter (Signed)
 Faxed and placed in scan bin

## 2024-07-25 DIAGNOSIS — M1990 Unspecified osteoarthritis, unspecified site: Secondary | ICD-10-CM | POA: Diagnosis not present

## 2024-07-25 DIAGNOSIS — Z7982 Long term (current) use of aspirin: Secondary | ICD-10-CM | POA: Diagnosis not present

## 2024-07-25 DIAGNOSIS — K219 Gastro-esophageal reflux disease without esophagitis: Secondary | ICD-10-CM | POA: Diagnosis not present

## 2024-07-25 DIAGNOSIS — R262 Difficulty in walking, not elsewhere classified: Secondary | ICD-10-CM | POA: Diagnosis not present

## 2024-07-25 DIAGNOSIS — I10 Essential (primary) hypertension: Secondary | ICD-10-CM | POA: Diagnosis not present

## 2024-07-25 DIAGNOSIS — M6281 Muscle weakness (generalized): Secondary | ICD-10-CM | POA: Diagnosis not present

## 2024-07-25 DIAGNOSIS — Z853 Personal history of malignant neoplasm of breast: Secondary | ICD-10-CM | POA: Diagnosis not present

## 2024-07-25 DIAGNOSIS — G43109 Migraine with aura, not intractable, without status migrainosus: Secondary | ICD-10-CM | POA: Diagnosis not present

## 2024-07-25 DIAGNOSIS — G9341 Metabolic encephalopathy: Secondary | ICD-10-CM | POA: Diagnosis not present

## 2024-07-25 DIAGNOSIS — E871 Hypo-osmolality and hyponatremia: Secondary | ICD-10-CM | POA: Diagnosis not present

## 2024-07-25 DIAGNOSIS — I69320 Aphasia following cerebral infarction: Secondary | ICD-10-CM | POA: Diagnosis not present

## 2024-07-25 DIAGNOSIS — E876 Hypokalemia: Secondary | ICD-10-CM | POA: Diagnosis not present

## 2024-07-25 DIAGNOSIS — G4733 Obstructive sleep apnea (adult) (pediatric): Secondary | ICD-10-CM | POA: Diagnosis not present

## 2024-07-25 NOTE — Progress Notes (Signed)
 Called patient to relay lab results. Left vm to return call

## 2024-07-27 NOTE — Assessment & Plan Note (Signed)
 Deteriorated.  Pt now w/ 2+ pitting edema bilaterally and up 9 lbs in 2 weeks.  Diet is high in salt- encouraged her to limit this as much as possible.  Swelling became much worse since starting Amlodipine .  Will stop Amlodipine .  Start Lasix .  Will also check labs and get ECHO to assess.  Pt expressed understanding and is in agreement w/ plan.

## 2024-07-28 NOTE — Progress Notes (Signed)
 Called patient and patients daughter Ann Conley. They verbalized understanding of Dr. Charis message. They will make appt or go to ED if needed

## 2024-07-28 NOTE — Telephone Encounter (Signed)
 Called patient to relay lab results  Patients daughter Inocente answered. Okay per DPR  Inocente stated that patient is having stroke like sx.  Left arm pain that goes into her shoulder. Pain comes and goes. Patient took 3 baby aspirins and pain decreased. Patient is not having HA, blurry vision, dizziness and chest pain.  I advised patients daughter to take her to ED or [facility] now to have her evaluated. Patients daughter stated that every time they take her for similar sx, it comes back as nothing and because of that, patient may not be willing to go.

## 2024-07-31 ENCOUNTER — Telehealth: Payer: Self-pay

## 2024-07-31 NOTE — Telephone Encounter (Signed)
 Patient has an appointment 08/14/2024 and provider is out of the office and we will need to R/S Ideally to Monday the 6th at 1:40. Began discussing with Inocente phone disconnected and I was unable to re connect

## 2024-08-04 ENCOUNTER — Other Ambulatory Visit: Payer: Self-pay | Admitting: Family Medicine

## 2024-08-04 ENCOUNTER — Telehealth: Payer: Self-pay | Admitting: Family Medicine

## 2024-08-04 ENCOUNTER — Other Ambulatory Visit (HOSPITAL_COMMUNITY): Payer: Self-pay

## 2024-08-04 NOTE — Telephone Encounter (Signed)
 Message has been sent to the provider about refilling medication. Waiting on provider to respond.

## 2024-08-04 NOTE — Telephone Encounter (Signed)
 Patient is requesting medication refill  Medication was d/c on 05/19/24. Okay to refill? Please advise

## 2024-08-04 NOTE — Telephone Encounter (Unsigned)
 Copied from CRM [id number]. Topic: Clinical - Medication Question >> Aug 04, 2024  2:30 PM Henretta I wrote: Reason for CRM: Patient was on Rimegepant Sulfate  (NURTEC) 75 MG TBDP but tried to fill it through pharmacy and was told it was discontinued. Patient stated pharmacy will be in contact but wanted to know if this can be re-sent in and filled for her

## 2024-08-05 ENCOUNTER — Other Ambulatory Visit (HOSPITAL_COMMUNITY): Payer: Self-pay

## 2024-08-05 ENCOUNTER — Other Ambulatory Visit: Payer: Self-pay

## 2024-08-05 MED ORDER — NURTEC 75 MG PO TBDP
75.0000 mg | ORAL_TABLET | Freq: Every day | ORAL | 1 refills | Status: DC | PRN
  Filled 2024-08-05: qty 8, 30d supply, fill #0
  Filled 2024-10-09: qty 8, 30d supply, fill #1

## 2024-08-05 NOTE — Telephone Encounter (Signed)
 I just refilled for patient.  Should be available later today.  The only reason it was d/c'd was b/c she told Dr Jerrell in July that she was not taking this medication

## 2024-08-05 NOTE — Telephone Encounter (Signed)
**Note De-identified  [place] Obfuscation** Please advise 

## 2024-08-05 NOTE — Addendum Note (Signed)
 Addended by: Renella Steig E on: 08/05/2024 03:17 PM   Modules accepted: Level of Service

## 2024-08-05 NOTE — Assessment & Plan Note (Signed)
 Chronic problem.  Pt's Chlorthalidone  was stopped due to low Na and K+ and she was started on Amlodipine .  Will repeat BMP today to assess electrolytes.  No med changes at this time.

## 2024-08-05 NOTE — Assessment & Plan Note (Signed)
 Improved.  She was told to get outpt sleep evaluation as she has known OSA but is not compliant w/ CPAP.  There was also discussion of possible dementia workup.  Discussed this w/ pt and daughter.  She admits she is emotional and overwhelmed w/ her husband's memory issues and the fact that they are facing an upcoming move to [facility].  Will refer to Neuro for a complete evaluation but also had a discussion regarding pseduo-dementia.  Will follow.

## 2024-08-08 ENCOUNTER — Ambulatory Visit: Payer: Self-pay

## 2024-08-08 ENCOUNTER — Telehealth: Payer: Self-pay | Admitting: Family Medicine

## 2024-08-08 DIAGNOSIS — E876 Hypokalemia: Secondary | ICD-10-CM | POA: Diagnosis not present

## 2024-08-08 DIAGNOSIS — I69320 Aphasia following cerebral infarction: Secondary | ICD-10-CM | POA: Diagnosis not present

## 2024-08-08 DIAGNOSIS — E871 Hypo-osmolality and hyponatremia: Secondary | ICD-10-CM | POA: Diagnosis not present

## 2024-08-08 DIAGNOSIS — G43109 Migraine with aura, not intractable, without status migrainosus: Secondary | ICD-10-CM | POA: Diagnosis not present

## 2024-08-08 DIAGNOSIS — Z853 Personal history of malignant neoplasm of breast: Secondary | ICD-10-CM | POA: Diagnosis not present

## 2024-08-08 DIAGNOSIS — K219 Gastro-esophageal reflux disease without esophagitis: Secondary | ICD-10-CM | POA: Diagnosis not present

## 2024-08-08 DIAGNOSIS — G9341 Metabolic encephalopathy: Secondary | ICD-10-CM | POA: Diagnosis not present

## 2024-08-08 DIAGNOSIS — I10 Essential (primary) hypertension: Secondary | ICD-10-CM | POA: Diagnosis not present

## 2024-08-08 DIAGNOSIS — G4733 Obstructive sleep apnea (adult) (pediatric): Secondary | ICD-10-CM | POA: Diagnosis not present

## 2024-08-08 DIAGNOSIS — Z7982 Long term (current) use of aspirin: Secondary | ICD-10-CM | POA: Diagnosis not present

## 2024-08-08 DIAGNOSIS — M6281 Muscle weakness (generalized): Secondary | ICD-10-CM | POA: Diagnosis not present

## 2024-08-08 DIAGNOSIS — M1990 Unspecified osteoarthritis, unspecified site: Secondary | ICD-10-CM | POA: Diagnosis not present

## 2024-08-08 DIAGNOSIS — R262 Difficulty in walking, not elsewhere classified: Secondary | ICD-10-CM | POA: Diagnosis not present

## 2024-08-08 NOTE — Telephone Encounter (Signed)
 Home Health Certification or Plan of Care Tracking  Is this a Certification or Plan of Care? Plan of Care  [facility] Agency: [facility]  Order Number:    Has charge sheet been attached? [id number]  Where has form been placed:   Labeled & placed in provider bin

## 2024-08-08 NOTE — Telephone Encounter (Signed)
 FYI Only or Action Required?: FYI only for provider.  Patient was last seen in primary care on 07/23/2024 by Mahlon Comer BRAVO, MD.  Called Nurse Triage reporting Hypertension.  Symptoms began several days ago.  Interventions attempted: Nothing.  Symptoms are: unchanged.  Triage Disposition: See PCP When Office is Open (Within 3 Days)  Patient/caregiver understands and will follow disposition?: Yes  No appts with PCP until almost 2 weeks later. Made appt with Dr Jerrell next Tuesday.         Copied from CRM [id number]. Topic: Clinical - Red Word Triage >> Aug 08, 2024  1:56 PM Franky GRADE wrote: Red Word that prompted transfer to Nurse Triage: [facility] calling to advise that patient's blood pressure is elevated 169/82, patient is not experiencing any symptoms. Reason for Disposition  Systolic BP >= 160 OR Diastolic >= 100  Answer Assessment - Initial Assessment Questions 1. BLOOD PRESSURE: What is your blood pressure? Did you take at least two measurements 5 minutes apart?     169/82  172/84, 168/80, 169/82  2. ONSET: When did you take your blood pressure?     today 3. HOW: How did you take your blood pressure? (e.g., automatic home BP monitor, visiting nurse)     Visiting nurse 4. HISTORY: Do you have a history of high blood pressure?     yes 5. MEDICINES: Are you taking any medicines for blood pressure? Have you missed any doses recently?     No BP meds was on amlodipine   6. OTHER SYMPTOMS: Do you have any symptoms? (e.g., blurred vision, chest pain, difficulty breathing, headache, weakness)     Swelling legs ankle pain to mid calf  Protocols used: Blood Pressure - High-A-AH

## 2024-08-08 NOTE — Telephone Encounter (Signed)
 RICK Shaw /[facility] calling to advise that patient's blood pressure is elevated 169/82, patient is not experiencing any symptoms.   Patient is scheduled with Dr. Jerrell next week

## 2024-08-11 ENCOUNTER — Ambulatory Visit: Payer: Self-pay

## 2024-08-11 NOTE — Telephone Encounter (Signed)
 FYI Only or Action Required?: Action required by provider: refused ED.  Patient was last seen in primary care on 07/23/2024 by Mahlon Comer BRAVO, MD.  Called Nurse Triage reporting Headache.  Symptoms began today.  Interventions attempted: Nothing.  Symptoms are: unchanged.  Triage Disposition: Go to ED Now (Notify PCP)  Patient/caregiver understands and will follow disposition?: No, refuses disposition  Copied from CRM [id number]. Topic: Clinical - Red Word Triage >> Aug 11, 2024  4:35 PM Roselie BROCKS wrote: Red Word that prompted transfer to Nurse Triage: Patient states she is having high blood pressure and headache ,and feeling bad   At present pt is not having any s/s of HTN Reason for Disposition  [1] SEVERE headache (e.g., excruciating) AND [2] worst headache of life    Pt recently had stroke, pt stated hx of migraines that could also be a stroke, pt having headaches now, feeling bad and Bp is elevated: referred to ED & pt refused  Answer Assessment - Initial Assessment Questions 1. LOCATION: Where does it hurt?      migraine 2. ONSET: When did the headache start? (e.g., minutes, hours, days)      today 3. PATTERN: Does the pain come and go, or has it been constant since it started?     constant 4. SEVERITY: How bad is the pain? and What does it keep you from doing?  (e.g., Scale 1-10; mild, moderate, or severe)     moderate 5. RECURRENT SYMPTOM: Have you ever had headaches before? If Yes, ask: When was the last time? and What happened that time?      no 6. CAUSE: What do you think is causing the headache?     unknown 7. MIGRAINE: Have you been diagnosed with migraine headaches? If Yes, ask: Is this headache similar?      na 8. HEAD INJURY: Has there been any recent injury to your head?      na 9. OTHER SYMPTOMS: Do you have any other symptoms? (e.g., fever, stiff neck, eye pain, sore throat, cold symptoms)     Headache, feeling bad all over,  blood pressure high 170/76 10. PREGNANCY: Is there any chance you are pregnant? When was your last menstrual period?       Na  Pt stated calling in due to PCP cancelling appt on 08/14/2024.  PAS stated in note that pt having s/s: pt at first stated no s/s: no headache, no nothing - then also stated she does have a headache and informed nurse they provider has told her sometimes she may think it is a headache but it could actually be a stroke: also stated the only way she would no if stroke or not is if she takes medication for it and it goes away - if it does not go away then its a stroke.  Pt concerned and would like appt with PCP: nurse informed due to active s/s pt would need to go to ER for evaluation and I can;t make appt.  Pt was not happy with nurse decision and stated she will let provider know I would not schedule appt: nurse stated ok and informed pt that nurse will be routing information to PCP as well.  Pt stated I am not or I don;t think it is a stroke & would just like to see PCP: again informed pt she can refuse, I will document and send to PCP: nurse ended call and routing information to PCP.  Protocols used: [facility]

## 2024-08-11 NOTE — Telephone Encounter (Signed)
 Forms have been placed in sign folder at nurses station

## 2024-08-12 ENCOUNTER — Ambulatory Visit (INDEPENDENT_AMBULATORY_CARE_PROVIDER_SITE_OTHER): Admitting: Student in an Organized Health Care Education/Training Program

## 2024-08-12 ENCOUNTER — Encounter: Payer: Self-pay | Admitting: Student in an Organized Health Care Education/Training Program

## 2024-08-12 ENCOUNTER — Ambulatory Visit: Admitting: Student in an Organized Health Care Education/Training Program

## 2024-08-12 VITALS — BP 141/54 | HR 97 | Wt 238.0 lb

## 2024-08-12 DIAGNOSIS — G43109 Migraine with aura, not intractable, without status migrainosus: Secondary | ICD-10-CM | POA: Diagnosis not present

## 2024-08-12 DIAGNOSIS — R5381 Other malaise: Secondary | ICD-10-CM

## 2024-08-12 NOTE — Patient Instructions (Signed)
  VISIT SUMMARY: Today, we discussed your recent symptoms of numbness and tingling in your hands and face, as well as your history of migraines. You mentioned that these symptoms resolved after taking Nurtec and that you managed a mild headache this morning with ibuprofen  and aspirin . We also talked about your general fatigue and the stress you're experiencing due to potential life changes.  YOUR PLAN: -MIGRAINE WITH AURA: A migraine with aura is a type of headache that includes visual disturbances or other neurological symptoms before the headache starts. You should continue taking Nurtec as needed to manage your migraines. We will consult a neurologist in December to discuss possibly restarting Emgality  injections.  -DECLINING FUNCTIONAL STATUS: Your general fatigue and tiredness in your legs may be due to stress and life changes. It's important to continue walking daily to maintain your mobility. We also recommend considering additional home support to help manage your stress and functional needs.  INSTRUCTIONS: Please follow up with a neurologist in December to discuss the possibility of restarting Emgality  injections for your migraines. Continue your daily walking routine and consider additional home support to help manage your stress and functional needs.

## 2024-08-12 NOTE — Telephone Encounter (Signed)
 Can we please check on pt and make sure she's ok since there were multiple times 'stroke' was mentioned

## 2024-08-12 NOTE — Telephone Encounter (Signed)
 LVM

## 2024-08-12 NOTE — Progress Notes (Signed)
 Acute Office Visit  Subjective:     Patient ID: Ann Conley, female    DOB: February 22, 1934, 89 y.o.   MRN: [id number]  Chief Complaint  Patient presents with   Headache    Triage note in chart please review,       HPI  Discussed the use of AI scribe software for clinical note transcription with the patient, who gave verbal consent to proceed.  History of Present Illness Ann Conley is an 89 year old female with migraines who presents with numbness and tingling in her hands and face.  She experiences numbness and tingling in her hands, which sometimes progresses up her arm and shoulder, and occasionally affects her lips. These symptoms were present yesterday but have since resolved. No headache was noted at the time of these symptoms. She took Nurtec before bed last night around 11 PM, which she believes helped alleviate her symptoms. This morning, she woke up with a mild headache, which she managed with ibuprofen  and aspirin .  She has a history of migraines that began in her early 89. She previously used Emgality  injections but is no longer on this medication. Her migraines have not been severe recently, but they have been concerning due to the potential for stroke-like symptoms. She experiences aura with her migraines, which can include visual disturbances and numbness.  She describes a stressful situation involving her children trying to move her into a retirement community, which she finds financially unfeasible. This stress may be contributing to her overall feeling of not being well. She also reports feeling tired, particularly in the back of her legs, despite trying to walk daily.  She notes that her feet feel numb at times. She wears sunglasses regularly to manage light sensitivity, which can be a trigger for her migraines. No current headache, nausea, or light sensitivity.     Objective:    BP (!) 141/54   Pulse 97   Wt 238 lb (108 kg)   SpO2 97%   BMI  48.07 kg/m   Physical Exam  Gen: Well-appearing woman Neuro: Alert, conversational, cranial nerves are normal bilaterally, she has full strength in the upper and lower extremities bilaterally, normal sensation throughout, normal reflexes in the upper extremities, ambulates with the use of a walker with a wide-based gait chronically Psych: Appropriate mood and affect, anxious appearing at times, her speech is organized but tangential Ext: Warm, no edema, normal joints Skin: No rashes      Assessment & Plan:   Problem List Items Addressed This Visit       Unprioritized   Migraine with aura - Primary   Recent symptoms of mild transient paresthesia on the right arm with headache and visual aura is consistent with previous episodes of migraine.  She took a dose of Nurtec last night and is feeling much better today.  Neuroexam today is reassuring and normal.  She had hospitalization in late August for transient aphasia, MRI at that time was reassuring.  Under a lot of stress currently given declining functional status, she was interviewing potential care aides yesterday when the symptoms started.  I provided reassurance today.  I recommended if she has symptoms of migraines in the future to stay calm and use Nurtec as needed.  Call us  if symptoms are severe or do not improve.  She has consultation planned with neurology in December.  Based on normal neuroexam today I do not think we need to repeat CNS imaging.  Declining functional status   I last spoke with the patient and her husband about this in early August.  Continues to struggle at home, her husband has dementia and she seems to have at least moderate neurocognitive decline as well.  They have good support from their children, were considering assisted living facility but feel that it is unaffordable for them.  Now they are trying to find a nursing aide to help them with care at their home.  I reviewed her medication list, no centrally  acting medicines.  They have plenty of assist devices at home, both are using walkers.  Unfortunately I think they are at risk for falls, injuries, and declining functional status in the weeks and months to come.  I am hopeful that they can find an arrangement to get more support to improve the quality of their lives.       Return if symptoms worsen or fail to improve.  Cleatus Debby Specking, MD

## 2024-08-12 NOTE — Assessment & Plan Note (Signed)
 Recent symptoms of mild transient paresthesia on the right arm with headache and visual aura is consistent with previous episodes of migraine.  She took a dose of Nurtec last night and is feeling much better today.  Neuroexam today is reassuring and normal.  She had hospitalization in late August for transient aphasia, MRI at that time was reassuring.  Under a lot of stress currently given declining functional status, she was interviewing potential care aides yesterday when the symptoms started.  I provided reassurance today.  I recommended if she has symptoms of migraines in the future to stay calm and use Nurtec as needed.  Call us  if symptoms are severe or do not improve.  She has consultation planned with neurology in December.  Based on normal neuroexam today I do not think we need to repeat CNS imaging.

## 2024-08-12 NOTE — Assessment & Plan Note (Signed)
 I last spoke with the patient and her husband about this in early August.  Continues to struggle at home, her husband has dementia and she seems to have at least moderate neurocognitive decline as well.  They have good support from their children, were considering assisted living facility but feel that it is unaffordable for them.  Now they are trying to find a nursing aide to help them with care at their home.  I reviewed her medication list, no centrally acting medicines.  They have plenty of assist devices at home, both are using walkers.  Unfortunately I think they are at risk for falls, injuries, and declining functional status in the weeks and months to come.  I am hopeful that they can find an arrangement to get more support to improve the quality of their lives.

## 2024-08-12 NOTE — Telephone Encounter (Signed)
 Patient has been added to Dr.Vincent's schedule for today

## 2024-08-14 ENCOUNTER — Ambulatory Visit: Admitting: Family Medicine

## 2024-08-14 DIAGNOSIS — I69320 Aphasia following cerebral infarction: Secondary | ICD-10-CM | POA: Diagnosis not present

## 2024-08-14 DIAGNOSIS — E876 Hypokalemia: Secondary | ICD-10-CM | POA: Diagnosis not present

## 2024-08-14 DIAGNOSIS — G9341 Metabolic encephalopathy: Secondary | ICD-10-CM | POA: Diagnosis not present

## 2024-08-14 DIAGNOSIS — M1990 Unspecified osteoarthritis, unspecified site: Secondary | ICD-10-CM | POA: Diagnosis not present

## 2024-08-14 DIAGNOSIS — Z7982 Long term (current) use of aspirin: Secondary | ICD-10-CM | POA: Diagnosis not present

## 2024-08-14 DIAGNOSIS — E871 Hypo-osmolality and hyponatremia: Secondary | ICD-10-CM | POA: Diagnosis not present

## 2024-08-14 DIAGNOSIS — G43109 Migraine with aura, not intractable, without status migrainosus: Secondary | ICD-10-CM | POA: Diagnosis not present

## 2024-08-14 DIAGNOSIS — R262 Difficulty in walking, not elsewhere classified: Secondary | ICD-10-CM | POA: Diagnosis not present

## 2024-08-14 DIAGNOSIS — K219 Gastro-esophageal reflux disease without esophagitis: Secondary | ICD-10-CM | POA: Diagnosis not present

## 2024-08-14 DIAGNOSIS — G4733 Obstructive sleep apnea (adult) (pediatric): Secondary | ICD-10-CM | POA: Diagnosis not present

## 2024-08-14 DIAGNOSIS — Z853 Personal history of malignant neoplasm of breast: Secondary | ICD-10-CM | POA: Diagnosis not present

## 2024-08-14 DIAGNOSIS — M6281 Muscle weakness (generalized): Secondary | ICD-10-CM | POA: Diagnosis not present

## 2024-08-14 DIAGNOSIS — I10 Essential (primary) hypertension: Secondary | ICD-10-CM | POA: Diagnosis not present

## 2024-08-15 NOTE — Telephone Encounter (Signed)
 Form completed and returned to [place]

## 2024-08-18 ENCOUNTER — Other Ambulatory Visit (INDEPENDENT_AMBULATORY_CARE_PROVIDER_SITE_OTHER)

## 2024-08-18 ENCOUNTER — Ambulatory Visit (HOSPITAL_COMMUNITY)
Admission: RE | Admit: 2024-08-18 | Discharge: 2024-08-18 | Disposition: A | Discharge status: Home / Self Care | Source: Ambulatory Visit | Attending: Family Medicine | Admitting: Family Medicine

## 2024-08-18 ENCOUNTER — Telehealth: Payer: Self-pay | Admitting: Diagnostic Neuroimaging

## 2024-08-18 DIAGNOSIS — Z7982 Long term (current) use of aspirin: Secondary | ICD-10-CM | POA: Diagnosis not present

## 2024-08-18 DIAGNOSIS — Z853 Personal history of malignant neoplasm of breast: Secondary | ICD-10-CM | POA: Diagnosis not present

## 2024-08-18 DIAGNOSIS — I340 Nonrheumatic mitral (valve) insufficiency: Secondary | ICD-10-CM | POA: Diagnosis not present

## 2024-08-18 DIAGNOSIS — G9341 Metabolic encephalopathy: Secondary | ICD-10-CM | POA: Diagnosis not present

## 2024-08-18 DIAGNOSIS — Z8639 Personal history of other endocrine, nutritional and metabolic disease: Secondary | ICD-10-CM | POA: Diagnosis not present

## 2024-08-18 DIAGNOSIS — R0609 Other forms of dyspnea: Secondary | ICD-10-CM | POA: Diagnosis not present

## 2024-08-18 DIAGNOSIS — R0600 Dyspnea, unspecified: Secondary | ICD-10-CM | POA: Diagnosis not present

## 2024-08-18 DIAGNOSIS — R600 Localized edema: Secondary | ICD-10-CM | POA: Diagnosis not present

## 2024-08-18 DIAGNOSIS — G4733 Obstructive sleep apnea (adult) (pediatric): Secondary | ICD-10-CM | POA: Diagnosis not present

## 2024-08-18 DIAGNOSIS — E871 Hypo-osmolality and hyponatremia: Secondary | ICD-10-CM | POA: Diagnosis not present

## 2024-08-18 DIAGNOSIS — M1990 Unspecified osteoarthritis, unspecified site: Secondary | ICD-10-CM | POA: Diagnosis not present

## 2024-08-18 DIAGNOSIS — E876 Hypokalemia: Secondary | ICD-10-CM | POA: Diagnosis not present

## 2024-08-18 DIAGNOSIS — R262 Difficulty in walking, not elsewhere classified: Secondary | ICD-10-CM | POA: Diagnosis not present

## 2024-08-18 DIAGNOSIS — G43109 Migraine with aura, not intractable, without status migrainosus: Secondary | ICD-10-CM | POA: Diagnosis not present

## 2024-08-18 DIAGNOSIS — I10 Essential (primary) hypertension: Secondary | ICD-10-CM | POA: Diagnosis not present

## 2024-08-18 DIAGNOSIS — I69320 Aphasia following cerebral infarction: Secondary | ICD-10-CM | POA: Diagnosis not present

## 2024-08-18 DIAGNOSIS — K219 Gastro-esophageal reflux disease without esophagitis: Secondary | ICD-10-CM | POA: Diagnosis not present

## 2024-08-18 DIAGNOSIS — M6281 Muscle weakness (generalized): Secondary | ICD-10-CM | POA: Diagnosis not present

## 2024-08-18 LAB — ECHOCARDIOGRAM COMPLETE
Area-P 1/2: 3.44 cm2
Est EF: >75
MV VTI: 1.76 cm2
S' Lateral: 2.6 cm

## 2024-08-18 NOTE — Telephone Encounter (Signed)
 Patient stated no longer can drive and have decided to do something else. Would like to cancel appointment

## 2024-08-18 NOTE — Telephone Encounter (Signed)
 Faxed and placed in scan bin

## 2024-08-18 NOTE — Progress Notes (Signed)
  Echocardiogram 2D Echocardiogram has been performed.  Ann Conley 08/18/2024, 3:58 PM

## 2024-08-18 NOTE — Addendum Note (Signed)
 Addended by: Britteny Fiebelkorn E on: 08/18/2024 06:07 PM   Modules accepted: Orders

## 2024-08-19 DIAGNOSIS — I10 Essential (primary) hypertension: Secondary | ICD-10-CM | POA: Diagnosis not present

## 2024-08-19 DIAGNOSIS — G9341 Metabolic encephalopathy: Secondary | ICD-10-CM | POA: Diagnosis not present

## 2024-08-19 DIAGNOSIS — K219 Gastro-esophageal reflux disease without esophagitis: Secondary | ICD-10-CM | POA: Diagnosis not present

## 2024-08-19 DIAGNOSIS — R262 Difficulty in walking, not elsewhere classified: Secondary | ICD-10-CM | POA: Diagnosis not present

## 2024-08-19 DIAGNOSIS — E876 Hypokalemia: Secondary | ICD-10-CM | POA: Diagnosis not present

## 2024-08-19 DIAGNOSIS — Z853 Personal history of malignant neoplasm of breast: Secondary | ICD-10-CM | POA: Diagnosis not present

## 2024-08-19 DIAGNOSIS — G43109 Migraine with aura, not intractable, without status migrainosus: Secondary | ICD-10-CM | POA: Diagnosis not present

## 2024-08-19 DIAGNOSIS — E871 Hypo-osmolality and hyponatremia: Secondary | ICD-10-CM | POA: Diagnosis not present

## 2024-08-19 DIAGNOSIS — G4733 Obstructive sleep apnea (adult) (pediatric): Secondary | ICD-10-CM | POA: Diagnosis not present

## 2024-08-19 DIAGNOSIS — I69320 Aphasia following cerebral infarction: Secondary | ICD-10-CM | POA: Diagnosis not present

## 2024-08-19 DIAGNOSIS — Z7982 Long term (current) use of aspirin: Secondary | ICD-10-CM | POA: Diagnosis not present

## 2024-08-19 DIAGNOSIS — M1990 Unspecified osteoarthritis, unspecified site: Secondary | ICD-10-CM | POA: Diagnosis not present

## 2024-08-19 DIAGNOSIS — M6281 Muscle weakness (generalized): Secondary | ICD-10-CM | POA: Diagnosis not present

## 2024-08-19 LAB — BASIC METABOLIC PANEL WITH GFR
BUN: 14 mg/dL (ref 6–23)
CO2: 31 meq/L (ref 19–32)
Calcium: 9.3 mg/dL (ref 8.4–10.5)
Chloride: 99 meq/L (ref 96–112)
Creatinine, Ser: 0.55 mg/dL (ref 0.40–1.20)
GFR: 82.17 mL/min (ref >60.00)
Glucose, Bld: 117 mg/dL — ABNORMAL HIGH (ref 70–99)
Potassium: 4 meq/L (ref 3.5–5.1)
Sodium: 136 meq/L (ref 135–145)

## 2024-08-19 NOTE — Progress Notes (Signed)
 Pt has reviewed via MyChart

## 2024-08-20 ENCOUNTER — Ambulatory Visit: Admitting: Family Medicine

## 2024-08-20 ENCOUNTER — Ambulatory Visit: Payer: Self-pay | Admitting: Family Medicine

## 2024-08-20 NOTE — Progress Notes (Signed)
 Lab results have been discussed.   Verbalized understanding? Yes  Are there any questions? No

## 2024-08-21 ENCOUNTER — Other Ambulatory Visit (HOSPITAL_COMMUNITY): Payer: Self-pay

## 2024-08-25 DIAGNOSIS — G9341 Metabolic encephalopathy: Secondary | ICD-10-CM | POA: Diagnosis not present

## 2024-08-25 DIAGNOSIS — Z853 Personal history of malignant neoplasm of breast: Secondary | ICD-10-CM | POA: Diagnosis not present

## 2024-08-25 DIAGNOSIS — E876 Hypokalemia: Secondary | ICD-10-CM | POA: Diagnosis not present

## 2024-08-25 DIAGNOSIS — I10 Essential (primary) hypertension: Secondary | ICD-10-CM | POA: Diagnosis not present

## 2024-08-25 DIAGNOSIS — M6281 Muscle weakness (generalized): Secondary | ICD-10-CM | POA: Diagnosis not present

## 2024-08-25 DIAGNOSIS — G43109 Migraine with aura, not intractable, without status migrainosus: Secondary | ICD-10-CM | POA: Diagnosis not present

## 2024-08-25 DIAGNOSIS — R262 Difficulty in walking, not elsewhere classified: Secondary | ICD-10-CM | POA: Diagnosis not present

## 2024-08-25 DIAGNOSIS — M1990 Unspecified osteoarthritis, unspecified site: Secondary | ICD-10-CM | POA: Diagnosis not present

## 2024-08-25 DIAGNOSIS — K219 Gastro-esophageal reflux disease without esophagitis: Secondary | ICD-10-CM | POA: Diagnosis not present

## 2024-08-25 DIAGNOSIS — I69320 Aphasia following cerebral infarction: Secondary | ICD-10-CM | POA: Diagnosis not present

## 2024-08-25 DIAGNOSIS — Z7982 Long term (current) use of aspirin: Secondary | ICD-10-CM | POA: Diagnosis not present

## 2024-08-25 DIAGNOSIS — G4733 Obstructive sleep apnea (adult) (pediatric): Secondary | ICD-10-CM | POA: Diagnosis not present

## 2024-08-25 DIAGNOSIS — E871 Hypo-osmolality and hyponatremia: Secondary | ICD-10-CM | POA: Diagnosis not present

## 2024-08-28 DIAGNOSIS — G43109 Migraine with aura, not intractable, without status migrainosus: Secondary | ICD-10-CM | POA: Diagnosis not present

## 2024-08-28 DIAGNOSIS — M1990 Unspecified osteoarthritis, unspecified site: Secondary | ICD-10-CM | POA: Diagnosis not present

## 2024-08-28 DIAGNOSIS — M6281 Muscle weakness (generalized): Secondary | ICD-10-CM | POA: Diagnosis not present

## 2024-08-28 DIAGNOSIS — I69320 Aphasia following cerebral infarction: Secondary | ICD-10-CM | POA: Diagnosis not present

## 2024-08-28 DIAGNOSIS — E876 Hypokalemia: Secondary | ICD-10-CM | POA: Diagnosis not present

## 2024-08-28 DIAGNOSIS — K219 Gastro-esophageal reflux disease without esophagitis: Secondary | ICD-10-CM | POA: Diagnosis not present

## 2024-08-28 DIAGNOSIS — I10 Essential (primary) hypertension: Secondary | ICD-10-CM | POA: Diagnosis not present

## 2024-08-28 DIAGNOSIS — G9341 Metabolic encephalopathy: Secondary | ICD-10-CM | POA: Diagnosis not present

## 2024-08-28 DIAGNOSIS — R262 Difficulty in walking, not elsewhere classified: Secondary | ICD-10-CM | POA: Diagnosis not present

## 2024-08-28 DIAGNOSIS — Z7982 Long term (current) use of aspirin: Secondary | ICD-10-CM | POA: Diagnosis not present

## 2024-08-28 DIAGNOSIS — G4733 Obstructive sleep apnea (adult) (pediatric): Secondary | ICD-10-CM | POA: Diagnosis not present

## 2024-08-28 DIAGNOSIS — Z853 Personal history of malignant neoplasm of breast: Secondary | ICD-10-CM | POA: Diagnosis not present

## 2024-08-28 DIAGNOSIS — E871 Hypo-osmolality and hyponatremia: Secondary | ICD-10-CM | POA: Diagnosis not present

## 2024-09-02 ENCOUNTER — Other Ambulatory Visit: Payer: Self-pay

## 2024-09-02 ENCOUNTER — Other Ambulatory Visit: Payer: Self-pay | Admitting: Family Medicine

## 2024-09-02 ENCOUNTER — Other Ambulatory Visit (HOSPITAL_COMMUNITY): Payer: Self-pay

## 2024-09-02 MED ORDER — POTASSIUM CHLORIDE CRYS ER 20 MEQ PO TBCR
20.0000 meq | EXTENDED_RELEASE_TABLET | Freq: Every day | ORAL | 1 refills | Status: DC
  Filled 2024-09-02: qty 30, 30d supply, fill #0

## 2024-09-06 ENCOUNTER — Emergency Department (HOSPITAL_COMMUNITY)

## 2024-09-06 ENCOUNTER — Other Ambulatory Visit: Payer: Self-pay

## 2024-09-06 ENCOUNTER — Encounter (HOSPITAL_COMMUNITY): Payer: Self-pay

## 2024-09-06 ENCOUNTER — Emergency Department (HOSPITAL_COMMUNITY)
Admission: EM | Admit: 2024-09-06 | Discharge: 2024-09-07 | Disposition: A | Discharge status: Home / Self Care | Attending: Emergency Medicine | Admitting: Emergency Medicine

## 2024-09-06 DIAGNOSIS — I10 Essential (primary) hypertension: Secondary | ICD-10-CM | POA: Insufficient documentation

## 2024-09-06 DIAGNOSIS — R519 Headache, unspecified: Secondary | ICD-10-CM | POA: Insufficient documentation

## 2024-09-06 DIAGNOSIS — Z7982 Long term (current) use of aspirin: Secondary | ICD-10-CM | POA: Diagnosis not present

## 2024-09-06 DIAGNOSIS — M4802 Spinal stenosis, cervical region: Secondary | ICD-10-CM | POA: Diagnosis not present

## 2024-09-06 DIAGNOSIS — M4801 Spinal stenosis, occipito-atlanto-axial region: Secondary | ICD-10-CM | POA: Diagnosis not present

## 2024-09-06 DIAGNOSIS — M50222 Other cervical disc displacement at C5-C6 level: Secondary | ICD-10-CM | POA: Diagnosis not present

## 2024-09-06 DIAGNOSIS — G9389 Other specified disorders of brain: Secondary | ICD-10-CM | POA: Diagnosis not present

## 2024-09-06 MED ORDER — DEXAMETHASONE SOD PHOSPHATE PF 10 MG/ML IJ SOLN
10.0000 mg | Freq: Once | INTRAMUSCULAR | Status: AC
  Administered 2024-09-06: 10 mg via INTRAVENOUS

## 2024-09-06 NOTE — ED Provider Notes (Signed)
 89 yo female with elevated BP and headache. Amlodipine  dc due to swelling, PCP is working on this. Came due to concern for headache with elevated BP and HA not improving her IBU. Pending labs and head CT (had fall 1 week ago, fell backwards and hit head, not on thinners).  Physical Exam  BP (!) 162/76   Pulse 92   Temp 97.9 F (36.6 C) (Oral)   Resp 20   Ht 4' 11 (1.499 m)   Wt 101.6 kg   SpO2 96%   BMI 45.24 kg/m   Physical Exam  Procedures  Procedures  ED Course / MDM   Clinical Course as of 09/06/24 2353  Sat Sep 06, 2024  2347 Patient to ED with frontal headache as detailed in the HPI. She is concerned the headache is not resolved with ibuprofen  as her migraine usually is, and her blood pressure has been elevated. Neurologically intact without deficits. Recent fall as well. CT head neck pending. Medications ordered for headache.   Patient care signed out the Ann Chancy, PA-C, pending CT results and re-evaluation after medications.  [SU]    Clinical Course User Index [SU] Ann Balls, PA-C   Medical Decision Making Amount and/or Complexity of Data Reviewed Labs: ordered. Radiology: ordered.  Risk Prescription drug management.   ***

## 2024-09-06 NOTE — ED Triage Notes (Signed)
 Patient BIB [facility] from home. Patient c/o of hypertension accompanied by a headache. Patient was taken off amlodipine  by PCP 1-2 months ago. Has been taking BP regularly with SBP 180s, tonight SBP >200 with headache. Ambulatory with assistance to bed. A&Ox4. Denies any other complaints.

## 2024-09-06 NOTE — ED Provider Notes (Signed)
 [place] [facility] AT [facility] Provider Note   CSN: [id number] Arrival date & time: 09/06/24  2056     Patient presents with: Hypertension and Headache   Ann Conley is a 89 y.o. female.   Patient to ED for evaluation of frontal headache that started this morning. She reports history of migraines but usually better with ibuprofen . She states she became concerned because her blood pressure has been elevated since being taken off her amlodipine  recently. She also reports recent fall where she went backwards, hitting her head on the floor. She denies LOC at the time, was able to get herself up and has been asymptomatic since, until headache today. No nausea or vomiting. No visual change. No weakness.   The history is provided by the patient. No language interpreter was used.  Hypertension Associated symptoms include headaches.  Headache      Prior to Admission medications   Medication Sig Start Date End Date Taking? Authorizing Provider  aspirin  81 MG chewable tablet Chew 1 tablet (81 mg total) by mouth daily. 07/04/24   Horton, Roxie CHRISTELLA, DO  furosemide  (LASIX ) 20 MG tablet Take 1 tablet (20 mg total) by mouth daily as needed for edema. 07/23/24   [name], Katherine E, MD  ibuprofen  (ADVIL ) 800 MG tablet Take 1 tablet (800 mg total) by mouth every 8 (eight) hours as needed for headache. 07/23/24   [name], Katherine E, MD  Magnesium 250 MG TABS Take 250 mg by mouth as needed.    [provider]  potassium chloride  SA (KLOR-CON  M) 20 MEQ tablet Take 1 tablet (20 mEq total) by mouth daily. 09/02/24   Mahlon Comer BRAVO, MD  Rimegepant Sulfate  (NURTEC) 75 MG TBDP Take 1 tablet (75 mg total) by mouth daily as needed at start of migraine (no more than 1 tablet in 24 hours or 8 per month). 08/05/24   [name], Katherine E, MD  Vitamin D , Ergocalciferol , (DRISDOL ) 1.25 MG (50000 UNIT) CAPS capsule Take 1 capsule (50,000 Units total) by mouth every 7 (seven)  days. Patient not taking: Reported on 07/23/2024 02/15/24   Mahlon Comer BRAVO, MD    Allergies: Codeine    Review of Systems  Neurological:  Positive for headaches.    Updated Vital Signs BP (!) 162/76   Pulse 92   Temp 97.9 F (36.6 C) (Oral)   Resp 20   Ht 4' 11 (1.499 m)   Wt 101.6 kg   SpO2 96%   BMI 45.24 kg/m   Physical Exam Vitals and nursing note reviewed.  Constitutional:      Appearance: She is well-developed. She is obese.  HENT:     Head: Normocephalic and atraumatic.  Eyes:     Extraocular Movements: Extraocular movements intact.     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     GCS: GCS eye subscore is 4. GCS verbal subscore is 5. GCS motor subscore is 6.     Cranial Nerves: No dysarthria or facial asymmetry.     Sensory: No sensory deficit.     Motor: No pronator drift.     Coordination: Finger-Nose-Finger Test normal.     (all labs ordered are listed, but only abnormal results are displayed) Labs Reviewed  CBC WITH DIFFERENTIAL/PLATELET  BASIC METABOLIC PANEL WITH GFR    EKG: None  Radiology: No results found.   Procedures  Medications Ordered in the ED  dexamethasone (DECADRON) injection 10 mg (has no administration in time range)    Clinical Course as of 09/06/24 2350  Sat Sep 06, 2024  2347 Patient to ED with frontal headache as detailed in the HPI. She is concerned the headache is not resolved with ibuprofen  as her migraine usually is, and her blood pressure has been elevated. Neurologically intact without deficits. Recent fall as well. CT head neck pending. Medications ordered for headache.   Patient care signed out the Leita Chancy, PA-C, pending CT results and re-evaluation after medications.  [SU]    Clinical Course User Index [SU] Odell Balls, PA-C                                 Medical Decision  Making Amount and/or Complexity of Data Reviewed Labs: ordered. Radiology: ordered.  Risk Prescription drug management.        Final diagnoses:  Acute nonintractable headache, unspecified headache type    ED Discharge Orders     None          Odell Balls, PA-C 09/06/24 2350    Randol Simmonds, MD 09/07/24 1504

## 2024-09-07 LAB — CBC WITH DIFFERENTIAL/PLATELET
Abs Immature Granulocytes: 0.05 K/uL (ref 0.00–0.07)
Basophils Absolute: 0 K/uL (ref 0.0–0.1)
Basophils Relative: 0 %
Eosinophils Absolute: 0.1 K/uL (ref 0.0–0.5)
Eosinophils Relative: 1 %
HCT: 39.6 % (ref 36.0–46.0)
Hemoglobin: 12.6 g/dL (ref 12.0–15.0)
Immature Granulocytes: 1 %
Lymphocytes Relative: 16 %
Lymphs Abs: 1.6 K/uL (ref 0.7–4.0)
MCH: 28.3 pg (ref 26.0–34.0)
MCHC: 31.8 g/dL (ref 30.0–36.0)
MCV: 88.8 fL (ref 80.0–100.0)
Monocytes Absolute: 0.7 K/uL (ref 0.1–1.0)
Monocytes Relative: 7 %
Neutro Abs: 7.6 K/uL (ref 1.7–7.7)
Neutrophils Relative %: 75 %
Platelets: 246 K/uL (ref 150–400)
RBC: 4.46 MIL/uL (ref 3.87–5.11)
RDW: 14.9 % (ref 11.5–15.5)
WBC: 10.2 K/uL (ref 4.0–10.5)
nRBC: 0 % (ref 0.0–0.2)

## 2024-09-07 LAB — BASIC METABOLIC PANEL WITH GFR
Anion gap: 10 (ref 5–15)
BUN: 17 mg/dL (ref 8–23)
CO2: 23 mmol/L (ref 22–32)
Calcium: 8.5 mg/dL — ABNORMAL LOW (ref 8.9–10.3)
Chloride: 97 mmol/L — ABNORMAL LOW (ref 98–111)
Creatinine, Ser: 0.56 mg/dL (ref 0.44–1.00)
GFR, Estimated: >60 mL/min (ref >60)
Glucose, Bld: 120 mg/dL — ABNORMAL HIGH (ref 70–99)
Potassium: 4.7 mmol/L (ref 3.5–5.1)
Sodium: 130 mmol/L — ABNORMAL LOW (ref 135–145)

## 2024-09-07 NOTE — Discharge Instructions (Signed)
Follow up with your primary care provider. Return to the ER for worsening or concerning symptoms.

## 2024-09-08 ENCOUNTER — Ambulatory Visit: Payer: Self-pay

## 2024-09-08 DIAGNOSIS — K219 Gastro-esophageal reflux disease without esophagitis: Secondary | ICD-10-CM | POA: Diagnosis not present

## 2024-09-08 DIAGNOSIS — R262 Difficulty in walking, not elsewhere classified: Secondary | ICD-10-CM | POA: Diagnosis not present

## 2024-09-08 DIAGNOSIS — Z7982 Long term (current) use of aspirin: Secondary | ICD-10-CM | POA: Diagnosis not present

## 2024-09-08 DIAGNOSIS — Z853 Personal history of malignant neoplasm of breast: Secondary | ICD-10-CM | POA: Diagnosis not present

## 2024-09-08 DIAGNOSIS — E876 Hypokalemia: Secondary | ICD-10-CM | POA: Diagnosis not present

## 2024-09-08 DIAGNOSIS — G4733 Obstructive sleep apnea (adult) (pediatric): Secondary | ICD-10-CM | POA: Diagnosis not present

## 2024-09-08 DIAGNOSIS — I69320 Aphasia following cerebral infarction: Secondary | ICD-10-CM | POA: Diagnosis not present

## 2024-09-08 DIAGNOSIS — M6281 Muscle weakness (generalized): Secondary | ICD-10-CM | POA: Diagnosis not present

## 2024-09-08 DIAGNOSIS — M1990 Unspecified osteoarthritis, unspecified site: Secondary | ICD-10-CM | POA: Diagnosis not present

## 2024-09-08 DIAGNOSIS — I10 Essential (primary) hypertension: Secondary | ICD-10-CM | POA: Diagnosis not present

## 2024-09-08 DIAGNOSIS — G9341 Metabolic encephalopathy: Secondary | ICD-10-CM | POA: Diagnosis not present

## 2024-09-08 DIAGNOSIS — E871 Hypo-osmolality and hyponatremia: Secondary | ICD-10-CM | POA: Diagnosis not present

## 2024-09-08 DIAGNOSIS — G43109 Migraine with aura, not intractable, without status migrainosus: Secondary | ICD-10-CM | POA: Diagnosis not present

## 2024-09-08 NOTE — Telephone Encounter (Signed)
 FYI Only or Action Required?: Action required by provider: request for appointment, update on patient condition, and refused ER.  Patient was last seen in primary care on 08/12/2024 by Jerrell Cleatus Ned, MD.  Called Nurse Triage reporting Headache.  Symptoms began today.  Interventions attempted: Rest, hydration, or home remedies.  Symptoms are: unchanged.  Triage Disposition: Call EMS 911 Now  Patient/caregiver understands and will follow disposition?: No, refuses disposition  Copied from CRM #[id number]. Topic: Clinical - Red Word Triage >> Sep 08, 2024 11:24 AM Dedra B wrote: Red Word that prompted transfer to Nurse Triage: Pt having very sharp pain in her head that started this morning. Was seen in ER yesterday for elevated BP. Warm transfer to NT. Reason for Disposition  [1] Loss of speech or garbled speech AND [2] new-onset  Answer Assessment - Initial Assessment Questions Additional info:  1) ER 08/06/24 for headache and high blood pressure. Her symptoms resolved and recurred at 6am today. She is not on bp medications due to swelling, per patient pcp aware she is not on blood pressure medication.  2) Advised ER but patient refuses, she wants appointment with pcp today or other provider in office.    1. LOCATION: Where does it hurt?      Front. Not a headache feels like someone is hitting me with a mallet intermittently  2. ONSET: When did the headache start? (e.g., minutes, hours, days)      6am  3. PATTERN: Does the pain come and go, or has it been constant since it started?     Intermittent feels like someone hitting head with hammer 4. SEVERITY: How bad is the pain? and What does it keep you from doing?  (e.g., Scale 1-10; mild, moderate, or severe)      5. RECURRENT SYMPTOM: Have you ever had headaches before? If Yes, ask: When was the last time? and What happened that time?      no 6. CAUSE: What do you think is causing the headache?      Unsure-blood pressure-she does not measure at home.  7. MIGRAINE: Have you been diagnosed with migraine headaches? If Yes, ask: Is this headache similar?      no 8. HEAD INJURY: Has there been any recent injury to your head?      no 9. OTHER SYMPTOMS: Do you have any other symptoms? (e.g., fever, stiff neck, eye pain, sore throat, cold symptoms)     Denies all other symptoms. Voice sounds garbled on call but she denies this.  Protocols used: [facility]

## 2024-09-08 NOTE — Telephone Encounter (Signed)
 Pt has appt with Webb,NP 09/09/2024 Pt refused ER

## 2024-09-08 NOTE — Telephone Encounter (Signed)
 Noted. Pt has appt in office 09/09/2024

## 2024-09-08 NOTE — Progress Notes (Unsigned)
 Cardiology Office Note:   Date:  09/10/2024  ID:  [name], DOB 04-03-1934, MRN [id number] PCP:  Mahlon Comer BRAVO, MD  [facility] Providers Cardiologist:  Wendel Haws, MD Referring MD: Mahlon Comer BRAVO, MD  Chief Complaint/Reason for Referral: LVH ASSESSMENT:    1. Diastolic dysfunction   2. Primary hypertension   3. Hyperlipidemia LDL goal <70   4. Aortic atherosclerosis   5. CKD (chronic kidney disease) stage 2, GFR 60-89 ml/min   6. BMI 40.0-44.9, adult (HCC)     PLAN:   In order of problems listed above: Diastolic dysfunction: Continue losartan 25 mg, change Lasix  to 20 mg daily.  Check BMP, mag in 1 week.  Will consider SGLT2 inhibitor in the future Hypertension: Continue losartan 25 mg daily.  She had a recent fall.  Her blood pressure is above goal today.  Will defer intensification of this medication and monitor for now. Hyperlipidemia: By virtue of the presence of aortic atherosclerosis patient's LDL goal is at least less than 70.  In this advanced age individual would defer statin therapy. Aortic atherosclerosis: Continue aspirin  81 mg, defer statin. CKD stage II: Continue losartan 25 mg.  Consider SGLT2 inhibitor in the future Elevated BMI: Continue diet and exercise modification.            Dispo:  Return in about 6 months (around 03/10/2025).       I spent 42 minutes reviewing all clinical data during and prior to this visit including all relevant imaging studies, laboratories, clinical information from other health systems and prior notes from both Cardiology and other specialties, interviewing the patient, conducting a complete physical examination, and coordinating care in order to formulate a comprehensive and personalized evaluation and treatment plan.   History of Present Illness:    FOCUSED PROBLEM LIST:   Diastolic dysfunction  Moderate LVH, mild MR, severe MAC, EF greater than 75% TTE October  2025 Hypertension Hyperlipidemia Aortic atherosclerosis CT abdomen pelvis 2025 CKD stage II Frailty Ambulates with walker BMI 41  November 2025:  Patient consents to use of AI scribe. The patient is a 89 year old female with above listed medical problems referred for recommendations regarding incidental LVH seen on echocardiogram.  The patient had been seen by this cardiology division around 5 years ago for atypical chest pain.  An evaluation was not pursued.  Patient saw her PCP recently and echocardiogram was ordered for dyspnea.  This demonstrated moderate LVH with a hyperdynamic LV function.  She is referred for further recommendations.  She experiences shortness of breath, particularly when walking short distances such as from the kitchen to her chair. No shortness of breath while sitting or lying flat, although she sometimes sleeps with her bed slightly elevated. No palpitations or chest pain. She was previously on furosemide  for swelling, which she discontinued as her leg swelling improved.  She has a history of weight gain associated with amlodipine , which was discontinued, resulting in a rapid weight loss of ten pounds. Her weight has been a lifelong issue. No recent episodes of lightheadedness or fainting but she reports a fall two weeks ago resulting in a head injury. She attributes the fall to standing up too quickly and has since been more diligent about using her walker.  She has a history of shingles and cellulitis around her ankle, which she initially thought was phlebitis. She describes a sensation of something occurring under the skin in that area.     Current Medications: Current Meds  Medication Sig  aspirin  81 MG chewable tablet Chew 1 tablet (81 mg total) by mouth daily.   furosemide  (LASIX ) 20 MG tablet Take 1 tablet (20 mg total) by mouth daily.   ibuprofen  (ADVIL ) 800 MG tablet Take 1 tablet (800 mg total) by mouth every 8 (eight) hours as needed for headache.    losartan (COZAAR) 25 MG tablet Take 1 tablet (25 mg total) by mouth daily.   Magnesium 250 MG TABS Take 250 mg by mouth as needed.   Rimegepant Sulfate  (NURTEC) 75 MG TBDP Take 1 tablet (75 mg total) by mouth daily as needed at start of migraine (no more than 1 tablet in 24 hours or 8 per month).   Vitamin D , Ergocalciferol , (DRISDOL ) 1.25 MG (50000 UNIT) CAPS capsule Take 1 capsule (50,000 Units total) by mouth every 7 (seven) days.     Review of Systems:   Please see the history of present illness.    All other systems reviewed and are negative.     EKGs/Labs/Other Test Reviewed:   EKG: 2025 sinus rhythm, left anterior fascicular block  EKG Interpretation Date/Time:  Wednesday September 10 2024 09:47:32 EST Ventricular Rate:  79 PR Interval:  192 QRS Duration:  104 QT Interval:  376 QTC Calculation: 431 R Axis:   -29  Text Interpretation: Normal sinus rhythm Normal ECG When compared with ECG of 04-Jul-2024 18:26, PREVIOUS ECG IS PRESENT Confirmed by Wendel Haws ([id number]) on 09/10/2024 9:51:55 AM        CARDIAC STUDIES: Refer to CV Procedures and Imaging Tabs   Risk Assessment/Calculations:          Physical Exam:   VS:  BP (!) 154/76 (BP Location: Right Arm, Patient Position: Sitting, Cuff Size: Large)   Pulse 79   Ht 4' 11 (1.499 m)   Wt 234 lb (106.1 kg)   SpO2 94%   BMI 47.26 kg/m    HYPERTENSION CONTROL Vitals:   09/10/24 0941  BP: (!) 154/76    The patient's blood pressure is elevated above target today.  In order to address the patient's elevated BP: Blood pressure will be monitored at home to determine if medication changes need to be made.      Wt Readings from Last 3 Encounters:  09/10/24 234 lb (106.1 kg)  09/09/24 231 lb 12.8 oz (105.1 kg)  09/06/24 224 lb (101.6 kg)      GENERAL:  No apparent distress, AOx3 HEENT:  No carotid bruits, +2 carotid impulses, no scleral icterus CAR: RRR no murmurs, gallops, rubs, or thrills RES:  Clear to  auscultation bilaterally ABD:  Soft, nontender, nondistended, positive bowel sounds x 4 VASC:  +2 radial pulses, +2 carotid pulses NEURO:  CN 2-12 grossly intact; motor and sensory grossly intact PSYCH:  No active depression or anxiety EXT:  No edema, ecchymosis, or cyanosis  Signed, Minie Roadcap K Arisbel Maione, MD  09/10/2024 10:18 AM    [facility] [address] [place], [place], [place]  [address] Phone: [phone]; Fax: [phone]   Note:  This document was prepared using Dragon voice recognition software and may include unintentional dictation errors.

## 2024-09-08 NOTE — Telephone Encounter (Signed)
 Copied from CRM [id number]. Topic: Clinical - Red Word Triage >> Sep 08, 2024  2:15 PM Alexandria E wrote: [facility] that prompted transfer to Nurse Triage: [place], home health nurse with [facility] called in to report a fall form the patient from two weeks ago, patient denied ER.

## 2024-09-08 NOTE — Telephone Encounter (Signed)
 Called patient and left vm to return call. Since Dr. Mahlon is out of office until mid November patient will have to see another provider. Please schedule patient if she calls back

## 2024-09-08 NOTE — Telephone Encounter (Signed)
 Ann Conley called to report that pt had a fall a couple of weeks ago and hit her head. She states the daughter reports that the patient refused the ER at that time. She states pt was seen in the ER for something unrelated the other day and doesn't believe they know about the fall. Forwarding to PCP as pt has appt tomorrow and already refused ER dispo today.

## 2024-09-09 ENCOUNTER — Other Ambulatory Visit: Payer: Self-pay | Admitting: Family

## 2024-09-09 ENCOUNTER — Ambulatory Visit (INDEPENDENT_AMBULATORY_CARE_PROVIDER_SITE_OTHER): Admitting: Family

## 2024-09-09 VITALS — BP 144/76 | HR 83 | Temp 97.7°F | Ht 59.0 in | Wt 231.8 lb

## 2024-09-09 DIAGNOSIS — R2689 Other abnormalities of gait and mobility: Secondary | ICD-10-CM

## 2024-09-09 DIAGNOSIS — I10 Essential (primary) hypertension: Secondary | ICD-10-CM | POA: Diagnosis not present

## 2024-09-09 DIAGNOSIS — R413 Other amnesia: Secondary | ICD-10-CM | POA: Diagnosis not present

## 2024-09-09 MED ORDER — LOSARTAN POTASSIUM 25 MG PO TABS: 25.0000 mg | ORAL_TABLET | Freq: Every day | ORAL | 1 refills | Status: DC

## 2024-09-09 NOTE — Progress Notes (Signed)
 Acute Office Visit  Subjective:     Patient ID: Ann Conley, female    DOB: 1933/12/08, 89 y.o.   MRN: [id number]  Chief Complaint  Patient presents with  . Follow-up    Head pains and doesn't understand where it is coming from. States weakness when she walks and un balanced and memory is not doing to good today.     HPI Patient is in today as a hospital follow-up from September 06, 2024 where she presented with elevated blood pressure and headache.  Patient has been off of amlodipine  2.5 mg after having peripheral edema.  Not currently taking any blood pressure medications.  She had a CT scan of her head and cervical spine at the ER that showed generalized cerebral atrophy and microvascular disease changes of the supratentorial brain.  Today she is concerned about a frontal headache that she rates about a 5 out of 10, responsive to ibuprofen .  Denies any blurry vision or double vision.  She requested an appointment to a neurologist closer to home so she has an appointment on September 18, 2024 with [facility] neurology.  She is also starting physical therapy tomorrow for her balance issues.  She is accompanied today by her husband and daughter.  Does admit to her life changing and potentially having to go into an assisted living type of facility with her husband.  Although, she reports having very helpful neighbors that have helped her be able to stay in her home.  Review of Systems  Constitutional: Negative.   HENT: Negative.    Eyes:  Negative for double vision.  Respiratory: Negative.    Cardiovascular: Negative.   Skin: Negative.   Neurological:  Positive for headaches.  Endo/Heme/Allergies: Negative.   Psychiatric/Behavioral:  Positive for memory loss.   All other systems reviewed and are negative.  Past Medical History:  Diagnosis Date  . Anemia   . Blood transfusion complicating pregnancy 1961  . GERD (gastroesophageal reflux disease)   . History of  chicken pox   . Hypertension   . Migraine    occular  . TIA (transient ischemic attack)     Social History   Socioeconomic History  . Marital status: Married    Spouse name: Not on file  . Number of children: 5  . Years of education: Not on file  . Highest education level: Bachelor's degree (e.g., BA, AB, BS)  Occupational History  . Not on file  Tobacco Use  . Smoking status: Never  . Smokeless tobacco: Never  Vaping Use  . Vaping status: Never Used  Substance and Sexual Activity  . Alcohol use: Yes    Comment: rare  . Drug use: No  . Sexual activity: Not Currently    Birth control/protection: Post-menopausal  Other Topics Concern  . Not on file  Social History Narrative   Lives with husband.  5 children.  Education BA.   Right handed   Caffeine use: 1 cup coffee every morning   Social Drivers of Health   Financial Resource Strain: Low Risk  (01/08/2024)   Overall Financial Resource Strain (CARDIA)   . Difficulty of Paying Living Expenses: Not hard at all  Food Insecurity: No Food Insecurity (07/05/2024)   Hunger Vital Sign   . Worried About Programme Researcher, Broadcasting/film/video in the Last Year: Never true   . Ran Out of Food in the Last Year: Never true  Transportation Needs: No Transportation Needs (07/05/2024)   PRAPARE -  Transportation   . Lack of Transportation (Medical): No   . Lack of Transportation (Non-Medical): No  Physical Activity: Inactive (01/08/2024)   Exercise Vital Sign   . Days of Exercise per Week: 0 days   . Minutes of Exercise per Session: 0 min  Stress: No Stress Concern Present (01/08/2024)   [employer] of Occupational Health - Occupational Stress Questionnaire   . Feeling of Stress : Not at all  Social Connections: Unknown (07/05/2024)   Social Connection and Isolation Panel   . Frequency of Communication with Friends and Family: Patient unable to answer   . Frequency of Social Gatherings with Friends and Family: Patient unable to answer   . Attends  Religious Services: Patient unable to answer   . Active Member of Clubs or Organizations: Patient unable to answer   . Attends Banker Meetings: Patient unable to answer   . Marital Status: Married  Catering Manager Violence: Unknown (07/05/2024)   Humiliation, Afraid, Rape, and Kick questionnaire   . Fear of Current or Ex-Partner: Patient unable to answer   . Emotionally Abused: Patient unable to answer   . Physically Abused: Not on file   . Sexually Abused: Patient unable to answer    Past Surgical History:  Procedure Laterality Date  . BREAST BIOPSY    . CHOLECYSTECTOMY    . PARTIAL HYSTERECTOMY      Family History  Problem Relation Age of Onset  . Breast cancer Mother   . Heart disease Father   . Heart disease Sister        pacemaker  . Diabetes Sister     Allergies  Allergen Reactions  . Codeine Nausea And Vomiting    Current Outpatient Medications on File Prior to Visit  Medication Sig Dispense Refill  . aspirin  81 MG chewable tablet Chew 1 tablet (81 mg total) by mouth daily. 30 tablet 0  . ibuprofen  (ADVIL ) 800 MG tablet Take 1 tablet (800 mg total) by mouth every 8 (eight) hours as needed for headache. 90 tablet 1  . Rimegepant Sulfate  (NURTEC) 75 MG TBDP Take 1 tablet (75 mg total) by mouth daily as needed at start of migraine (no more than 1 tablet in 24 hours or 8 per month). 8 tablet 1  . Vitamin D , Ergocalciferol , (DRISDOL ) 1.25 MG (50000 UNIT) CAPS capsule Take 1 capsule (50,000 Units total) by mouth every 7 (seven) days. 12 capsule 0  . furosemide  (LASIX ) 20 MG tablet Take 1 tablet (20 mg total) by mouth daily as needed for edema. (Patient not taking: Reported on 09/09/2024) 30 tablet 3  . Magnesium 250 MG TABS Take 250 mg by mouth as needed. (Patient not taking: Reported on 09/09/2024)    . potassium chloride  SA (KLOR-CON  M) 20 MEQ tablet Take 1 tablet (20 mEq total) by mouth daily. (Patient not taking: Reported on 09/09/2024) 30 tablet 1   No  current facility-administered medications on file prior to visit.    BP (!) 144/76   Pulse 83   Temp 97.7 F (36.5 C) (Oral)   Ht 4' 11 (1.499 m)   Wt 231 lb 12.8 oz (105.1 kg)   SpO2 94%   BMI 46.82 kg/m chart      Objective:    BP (!) 144/76   Pulse 83   Temp 97.7 F (36.5 C) (Oral)   Ht 4' 11 (1.499 m)   Wt 231 lb 12.8 oz (105.1 kg)   SpO2 94%   BMI 46.82 kg/m  Physical Exam Vitals and nursing note reviewed.  Constitutional:      Appearance: She is obese.  HENT:     Right Ear: Tympanic membrane, ear canal and external ear normal.     Left Ear: Tympanic membrane, ear canal and external ear normal.  Cardiovascular:     Rate and Rhythm: Normal rate and regular rhythm.     Pulses: Normal pulses.     Heart sounds: Normal heart sounds.  Pulmonary:     Effort: Pulmonary effort is normal.     Breath sounds: Normal breath sounds.  Abdominal:     General: Abdomen is flat. Bowel sounds are normal.     Palpations: Abdomen is soft.  Musculoskeletal:        General: Normal range of motion.  Skin:    General: Skin is warm and dry.  Neurological:     General: No focal deficit present.     Mental Status: She is alert and oriented to person, place, and time. Mental status is at baseline.  Psychiatric:        Mood and Affect: Mood normal.        Behavior: Behavior normal.    No results found for any visits on 09/09/24.      Assessment & Plan:   Problem List Items Addressed This Visit     Essential hypertension - Primary   Relevant Medications   losartan (COZAAR) 25 MG tablet   Other Visit Diagnoses       Memory changes         Balance problem           Meds ordered this encounter  Medications  . losartan (COZAAR) 25 MG tablet    Sig: Take 1 tablet (25 mg total) by mouth daily.    Dispense:  30 tablet    Refill:  1   Since patient has a follow-up with neurology to discuss her memory and balance problems we will let them discuss any further  intervention that they may have.  I did explain to the patient that the area of her brain that is affected controls memory and balance and that may be why she is having some of the concern she is having today.  It is important to continue physical therapy.  Will start losartan 25 mg once daily to lower the blood pressure.  Recheck of her blood pressure was 162/70, consistent with hospital readings.  Follow-up with Dr. Mahlon in 3 weeks and sooner as needed. No follow-ups on file.  Shamarion Coots B Rikayla Demmon, FNP

## 2024-09-10 ENCOUNTER — Ambulatory Visit: Attending: Internal Medicine | Admitting: Internal Medicine

## 2024-09-10 ENCOUNTER — Other Ambulatory Visit (HOSPITAL_COMMUNITY): Payer: Self-pay

## 2024-09-10 ENCOUNTER — Encounter: Payer: Self-pay | Admitting: Internal Medicine

## 2024-09-10 VITALS — BP 154/76 | HR 79 | Ht 59.0 in | Wt 234.0 lb

## 2024-09-10 DIAGNOSIS — E785 Hyperlipidemia, unspecified: Secondary | ICD-10-CM

## 2024-09-10 DIAGNOSIS — I10 Essential (primary) hypertension: Secondary | ICD-10-CM | POA: Diagnosis not present

## 2024-09-10 DIAGNOSIS — Z6841 Body Mass Index (BMI) 40.0 and over, adult: Secondary | ICD-10-CM

## 2024-09-10 DIAGNOSIS — I700 Atherosclerosis of aorta: Secondary | ICD-10-CM | POA: Diagnosis not present

## 2024-09-10 DIAGNOSIS — I5189 Other ill-defined heart diseases: Secondary | ICD-10-CM | POA: Diagnosis not present

## 2024-09-10 DIAGNOSIS — N182 Chronic kidney disease, stage 2 (mild): Secondary | ICD-10-CM

## 2024-09-10 MED ORDER — FUROSEMIDE 20 MG PO TABS
20.0000 mg | ORAL_TABLET | Freq: Every day | ORAL | 3 refills | Status: AC
  Filled 2024-09-10 (×2): qty 30, 30d supply, fill #0
  Filled 2024-10-03: qty 30, 30d supply, fill #1
  Filled 2024-11-02: qty 30, 30d supply, fill #2
  Filled 2024-12-02: qty 30, 30d supply, fill #3

## 2024-09-10 NOTE — Patient Instructions (Signed)
 Medication Instructions:  START Furosemide  (Lasix ) 20 mg once daily   *If you need a refill on your cardiac medications before your next appointment, please call your pharmacy*  Lab Work: To be completed in 1 week: BMP and magnesium  If you have labs (blood work) drawn today and your tests are completely normal, you will receive your results only by: MyChart Message (if you have MyChart) OR A paper copy in the mail If you have any lab test that is abnormal or we need to change your treatment, we will call you to review the results.  Testing/Procedures: None ordered today.  Follow-Up: At [facility], you and your health needs are our priority.  As part of our continuing mission to provide you with exceptional heart care, our providers are all part of one team.  This team includes your primary Cardiologist (physician) and Advanced Practice Providers or APPs (Physician Assistants and Nurse Practitioners) who all work together to provide you with the care you need, when you need it.  Your next appointment:   6 month(s)  Provider:   Arun Thukkani, MD

## 2024-09-11 ENCOUNTER — Inpatient Hospital Stay: Admitting: Student in an Organized Health Care Education/Training Program

## 2024-09-11 ENCOUNTER — Telehealth: Payer: Self-pay | Admitting: Family Medicine

## 2024-09-11 NOTE — Telephone Encounter (Signed)
 Placed in Dr.Greenes folder at nurse station

## 2024-09-11 NOTE — Telephone Encounter (Signed)
 Paperwork completed and placed in fax bin at back nurse station

## 2024-09-11 NOTE — Telephone Encounter (Signed)
 Home Health Certification or Plan of Care Tracking  Is this a Certification or Plan of Care? Plan of Care  [facility] Agency: [facility]  Order Number:  [id number]  Has charge sheet been attached? Yes  Where has form been placed:   Labeled & placed in provider bin

## 2024-09-12 ENCOUNTER — Telehealth: Payer: Self-pay

## 2024-09-12 NOTE — Telephone Encounter (Signed)
 Placed in PCP sign folder in POD A

## 2024-09-12 NOTE — Telephone Encounter (Signed)
 Type of form received: [facility] request for signature   Additional comments:  reschedule patient re-eval from 09/05/2024 to 09/08/2024  Received by: fax  Form should be Faxed/mailed to: [phone]  Is patient requesting call for pickup: no  Form placed:  in provider folder at front desk  Attach charge sheet.  Provider will determine charge.  Individual made aware of 3-5 business day turn around No?

## 2024-09-17 ENCOUNTER — Ambulatory Visit: Payer: Self-pay

## 2024-09-17 NOTE — Telephone Encounter (Signed)
 Patient had to go and help with her husband and would like a call back. Routing to call backs.       Copied from CRM [id number]. Topic: Clinical - Red Word Triage >> Sep 17, 2024  4:57 PM Shereese L wrote: [facility] that prompted transfer to Nurse Triage: feet are numb,fear of neuropathy

## 2024-09-17 NOTE — Telephone Encounter (Signed)
 This RN attempted to contact patient, no answer, left vm. Will route to clinic.

## 2024-09-18 ENCOUNTER — Institutional Professional Consult (permissible substitution): Admitting: Diagnostic Neuroimaging

## 2024-09-19 NOTE — Telephone Encounter (Signed)
 Appt scheduled

## 2024-09-23 ENCOUNTER — Ambulatory Visit (INDEPENDENT_AMBULATORY_CARE_PROVIDER_SITE_OTHER): Admitting: Family Medicine

## 2024-09-23 ENCOUNTER — Other Ambulatory Visit (HOSPITAL_COMMUNITY): Payer: Self-pay

## 2024-09-23 ENCOUNTER — Other Ambulatory Visit: Payer: Self-pay

## 2024-09-23 ENCOUNTER — Encounter: Payer: Self-pay | Admitting: Family Medicine

## 2024-09-23 ENCOUNTER — Telehealth: Payer: Self-pay | Admitting: Family Medicine

## 2024-09-23 VITALS — BP 138/78 | HR 78 | Temp 98.0°F | Ht 59.0 in | Wt 218.0 lb

## 2024-09-23 DIAGNOSIS — I10 Essential (primary) hypertension: Secondary | ICD-10-CM | POA: Diagnosis not present

## 2024-09-23 DIAGNOSIS — Z79899 Other long term (current) drug therapy: Secondary | ICD-10-CM

## 2024-09-23 DIAGNOSIS — R200 Anesthesia of skin: Secondary | ICD-10-CM

## 2024-09-23 DIAGNOSIS — Z636 Dependent relative needing care at home: Secondary | ICD-10-CM

## 2024-09-23 DIAGNOSIS — R202 Paresthesia of skin: Secondary | ICD-10-CM | POA: Diagnosis not present

## 2024-09-23 DIAGNOSIS — Z7982 Long term (current) use of aspirin: Secondary | ICD-10-CM

## 2024-09-23 DIAGNOSIS — Z9181 History of falling: Secondary | ICD-10-CM

## 2024-09-23 DIAGNOSIS — K219 Gastro-esophageal reflux disease without esophagitis: Secondary | ICD-10-CM | POA: Diagnosis not present

## 2024-09-23 DIAGNOSIS — G43109 Migraine with aura, not intractable, without status migrainosus: Secondary | ICD-10-CM

## 2024-09-23 DIAGNOSIS — G4733 Obstructive sleep apnea (adult) (pediatric): Secondary | ICD-10-CM

## 2024-09-23 DIAGNOSIS — M1990 Unspecified osteoarthritis, unspecified site: Secondary | ICD-10-CM

## 2024-09-23 DIAGNOSIS — I69320 Aphasia following cerebral infarction: Secondary | ICD-10-CM | POA: Diagnosis not present

## 2024-09-23 DIAGNOSIS — Z853 Personal history of malignant neoplasm of breast: Secondary | ICD-10-CM

## 2024-09-23 MED ORDER — SERTRALINE HCL 25 MG PO TABS
25.0000 mg | ORAL_TABLET | Freq: Every day | ORAL | 3 refills | Status: AC
  Filled 2024-09-23: qty 30, 30d supply, fill #0
  Filled 2024-10-16: qty 30, 30d supply, fill #1
  Filled 2024-11-15: qty 30, 30d supply, fill #2
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
## 2024-09-23 NOTE — Patient Instructions (Signed)
 Follow up in 4 weeks to recheck mood We'll notify you of your lab results and make any changes if needed START the Sertraline to help w/ mood/anxiety Speak with the kids about next steps and whether he needs assisted living/memory care b/c this is not healthy for you I think the floaters that you're seeing are related to your migraines Call with any questions or concerns Stay Safe!  Stay Healthy! Hang in there!

## 2024-09-23 NOTE — Telephone Encounter (Signed)
 Home Health Certification or Plan of Care Tracking  Is this a Certification or Plan of Care? Plan of Care  [facility] Agency: [facility]  Order Number:  [id number]  Has charge sheet been attached? Yes  Where has form been placed:   Labeled & placed in provider bin

## 2024-09-23 NOTE — Telephone Encounter (Signed)
 Faxed and placed in scan bin

## 2024-09-23 NOTE — Telephone Encounter (Signed)
 Forms completed and returned to Tonya

## 2024-09-24 ENCOUNTER — Ambulatory Visit: Payer: Self-pay | Admitting: Family Medicine

## 2024-09-24 LAB — BASIC METABOLIC PANEL WITH GFR
BUN: 17 mg/dL (ref 6–23)
CO2: 32 meq/L (ref 19–32)
Calcium: 9.5 mg/dL (ref 8.4–10.5)
Chloride: 97 meq/L (ref 96–112)
Creatinine, Ser: 0.65 mg/dL (ref 0.40–1.20)
GFR: 78.87 mL/min (ref >60.00)
Glucose, Bld: 97 mg/dL (ref 70–99)
Potassium: 4.4 meq/L (ref 3.5–5.1)
Sodium: 137 meq/L (ref 135–145)

## 2024-09-24 LAB — CBC WITH DIFFERENTIAL/PLATELET
Basophils Absolute: 0 K/uL (ref 0.0–0.1)
Basophils Relative: 0.6 % (ref 0.0–3.0)
Eosinophils Absolute: 0.1 K/uL (ref 0.0–0.7)
Eosinophils Relative: 2 % (ref 0.0–5.0)
HCT: 39.9 % (ref 36.0–46.0)
Hemoglobin: 13.2 g/dL (ref 12.0–15.0)
Lymphocytes Relative: 20.2 % (ref 12.0–46.0)
Lymphs Abs: 1.3 K/uL (ref 0.7–4.0)
MCHC: 33.1 g/dL (ref 30.0–36.0)
MCV: 86 fl (ref 78.0–100.0)
Monocytes Absolute: 0.4 K/uL (ref 0.1–1.0)
Monocytes Relative: 5.8 % (ref 3.0–12.0)
Neutro Abs: 4.7 K/uL (ref 1.4–7.7)
Neutrophils Relative %: 71.4 % (ref 43.0–77.0)
Platelets: 262 K/uL (ref 150.0–400.0)
RBC: 4.63 Mil/uL (ref 3.87–5.11)
RDW: 15.7 % — ABNORMAL HIGH (ref 11.5–15.5)
WBC: 6.5 K/uL (ref 4.0–10.5)

## 2024-09-24 LAB — HEPATIC FUNCTION PANEL
ALT: 13 U/L (ref 0–35)
AST: 16 U/L (ref 0–37)
Albumin: 4.2 g/dL (ref 3.5–5.2)
Alkaline Phosphatase: 87 U/L (ref 39–117)
Bilirubin, Direct: 0.1 mg/dL (ref 0.0–0.3)
Total Bilirubin: 0.5 mg/dL (ref 0.2–1.2)
Total Protein: 6.9 g/dL (ref 6.0–8.3)

## 2024-09-24 LAB — IRON,TIBC AND FERRITIN PANEL
%SAT: 17 % (ref 16–45)
Ferritin: 32 ng/mL (ref 16–288)
Iron: 61 ug/dL (ref 45–160)
TIBC: 350 ug/dL (ref 250–450)

## 2024-09-24 LAB — TSH: TSH: 2.6 u[IU]/mL (ref 0.35–5.50)

## 2024-09-24 LAB — MAGNESIUM: Magnesium: 1.9 mg/dL (ref 1.5–2.5)

## 2024-09-24 LAB — B12 AND FOLATE PANEL
Folate: 12.8 ng/mL (ref >5.9)
Vitamin B-12: >1500 pg/mL — ABNORMAL HIGH (ref 211–911)

## 2024-09-24 NOTE — Telephone Encounter (Signed)
 Placed in folder at nurse station

## 2024-09-26 ENCOUNTER — Telehealth: Payer: Self-pay

## 2024-09-26 NOTE — Telephone Encounter (Signed)
 Type of form received: [facility] Orders for Signature  Additional comments:   Received by: Fax  Form should be Faxed/mailed to: [phone]  Is patient requesting call for pickup: NO  Form placed:  in provider folder at front desk   Attach charge sheet.  Provider will determine charge.  Individual made aware of 3-5 business day turn around No?

## 2024-09-26 NOTE — Telephone Encounter (Signed)
 Placed in sign folder at POD A

## 2024-09-30 NOTE — Telephone Encounter (Signed)
 Faxed

## 2024-09-30 NOTE — Telephone Encounter (Signed)
 Forms completed and returned to Tonya

## 2024-09-30 NOTE — Telephone Encounter (Signed)
Form signed and returned to [place]

## 2024-10-03 ENCOUNTER — Other Ambulatory Visit (HOSPITAL_COMMUNITY): Payer: Self-pay

## 2024-10-09 ENCOUNTER — Other Ambulatory Visit (HOSPITAL_COMMUNITY): Payer: Self-pay

## 2024-10-09 ENCOUNTER — Other Ambulatory Visit: Payer: Self-pay

## 2024-10-24 ENCOUNTER — Telehealth: Payer: Self-pay

## 2024-10-24 NOTE — Telephone Encounter (Signed)
 Placed in sign folder at Nurse POD A

## 2024-10-24 NOTE — Telephone Encounter (Signed)
 Type of form received: [facility]   Additional comments:   Received by: fax  Form should be Faxed/mailed to: [phone]  Is patient requesting call for pickup: no  Form placed:  in providers folder at the front desk   Attach charge sheet.  Provider will determine charge.  Individual made aware of 3-5 business day turn around No?

## 2024-10-27 ENCOUNTER — Telehealth: Payer: Self-pay | Admitting: Family Medicine

## 2024-10-27 NOTE — Telephone Encounter (Signed)
 Type of form received: [facility]  Additional comments:   Received by: FAX  Form should be Faxed/mailed to: (address/ fax #) [phone]  Is patient requesting call for pickup:  Form placed:  Provider's bin  Attach charge sheet.  Provider will determine charge.  Individual made aware of 3-5 business day turn around Yes?

## 2024-10-27 NOTE — Telephone Encounter (Signed)
 Obtained and placed in providers folder at nurse station.

## 2024-10-31 NOTE — Telephone Encounter (Signed)
 Forms were faxed and scanned to providers folder

## 2024-10-31 NOTE — Telephone Encounter (Signed)
 Form completed and returned to [place]

## 2024-11-11 ENCOUNTER — Other Ambulatory Visit: Payer: Self-pay | Admitting: Family

## 2024-11-17 ENCOUNTER — Other Ambulatory Visit: Payer: Self-pay

## 2024-11-18 ENCOUNTER — Other Ambulatory Visit (HOSPITAL_COMMUNITY): Payer: Self-pay

## 2024-11-18 ENCOUNTER — Other Ambulatory Visit: Payer: Self-pay

## 2024-11-18 MED ORDER — NURTEC 75 MG PO TBDP
75.0000 mg | ORAL_TABLET | Freq: Every day | ORAL | 11 refills | Status: AC | PRN
  Filled 2024-11-18: qty 8, 30d supply, fill #0

## 2024-11-18 MED FILL — Losartan Potassium Tab 25 MG: ORAL | 90 days supply | Qty: 90 | Fill #0 | Status: AC

## 2024-11-27 ENCOUNTER — Telehealth: Payer: Self-pay

## 2024-11-27 NOTE — Telephone Encounter (Signed)
 Type of form received: [facility] Request for billing information  Additional comments: Multiple patients listed on document -removed other patients information and made copies for their charts   [employer]in [employer] total requesting additional Dx Code   Received by: Fax  Form should be Faxed/mailed to: [phone]  Is patient requesting call for pickup: no  Form placed:  In POD A sign folder   Attach charge sheet.  Provider will determine charge.  Individual made aware of 3-5 business day turn around No?

## 2025-01-13 ENCOUNTER — Encounter
